# Patient Record
Sex: Female | Born: 1935 | Race: White | Hispanic: No | Smoking: Never smoker
Health system: Southern US, Community
[De-identification: ages and names within clinical notes are randomized; demographics above are authoritative.]

## PROBLEM LIST (undated history)

## (undated) DIAGNOSIS — M25569 Pain in unspecified knee: Secondary | ICD-10-CM

## (undated) DIAGNOSIS — M899 Disorder of bone, unspecified: Secondary | ICD-10-CM

## (undated) DIAGNOSIS — M1990 Unspecified osteoarthritis, unspecified site: Secondary | ICD-10-CM

## (undated) DIAGNOSIS — J45909 Unspecified asthma, uncomplicated: Secondary | ICD-10-CM

## (undated) DIAGNOSIS — Z860100 Personal history of colon polyps, unspecified: Secondary | ICD-10-CM

## (undated) DIAGNOSIS — J189 Pneumonia, unspecified organism: Secondary | ICD-10-CM

## (undated) DIAGNOSIS — Z87898 Personal history of other specified conditions: Secondary | ICD-10-CM

## (undated) DIAGNOSIS — G629 Polyneuropathy, unspecified: Secondary | ICD-10-CM

## (undated) DIAGNOSIS — R609 Edema, unspecified: Secondary | ICD-10-CM

## (undated) DIAGNOSIS — G8929 Other chronic pain: Secondary | ICD-10-CM

## (undated) DIAGNOSIS — S5420XA Injury of radial nerve at forearm level, unspecified arm, initial encounter: Secondary | ICD-10-CM

## (undated) DIAGNOSIS — I25810 Atherosclerosis of coronary artery bypass graft(s) without angina pectoris: Secondary | ICD-10-CM

## (undated) DIAGNOSIS — R32 Unspecified urinary incontinence: Secondary | ICD-10-CM

## (undated) DIAGNOSIS — R600 Localized edema: Secondary | ICD-10-CM

## (undated) DIAGNOSIS — M5430 Sciatica, unspecified side: Secondary | ICD-10-CM

## (undated) DIAGNOSIS — K5790 Diverticulosis of intestine, part unspecified, without perforation or abscess without bleeding: Secondary | ICD-10-CM

## (undated) DIAGNOSIS — M949 Disorder of cartilage, unspecified: Secondary | ICD-10-CM

## (undated) DIAGNOSIS — K589 Irritable bowel syndrome without diarrhea: Secondary | ICD-10-CM

## (undated) DIAGNOSIS — R159 Full incontinence of feces: Secondary | ICD-10-CM

## (undated) DIAGNOSIS — M549 Dorsalgia, unspecified: Secondary | ICD-10-CM

## (undated) DIAGNOSIS — R531 Weakness: Secondary | ICD-10-CM

## (undated) DIAGNOSIS — F329 Major depressive disorder, single episode, unspecified: Secondary | ICD-10-CM

## (undated) DIAGNOSIS — L508 Other urticaria: Secondary | ICD-10-CM

## (undated) DIAGNOSIS — R351 Nocturia: Secondary | ICD-10-CM

## (undated) DIAGNOSIS — F3289 Other specified depressive episodes: Secondary | ICD-10-CM

## (undated) DIAGNOSIS — R350 Frequency of micturition: Secondary | ICD-10-CM

## (undated) DIAGNOSIS — G609 Hereditary and idiopathic neuropathy, unspecified: Secondary | ICD-10-CM

## (undated) DIAGNOSIS — I10 Essential (primary) hypertension: Secondary | ICD-10-CM

## (undated) DIAGNOSIS — R238 Other skin changes: Secondary | ICD-10-CM

## (undated) DIAGNOSIS — E785 Hyperlipidemia, unspecified: Secondary | ICD-10-CM

## (undated) DIAGNOSIS — B3731 Acute candidiasis of vulva and vagina: Secondary | ICD-10-CM

## (undated) DIAGNOSIS — R233 Spontaneous ecchymoses: Secondary | ICD-10-CM

## (undated) DIAGNOSIS — R3915 Urgency of urination: Secondary | ICD-10-CM

## (undated) DIAGNOSIS — Z8679 Personal history of other diseases of the circulatory system: Secondary | ICD-10-CM

## (undated) DIAGNOSIS — M069 Rheumatoid arthritis, unspecified: Secondary | ICD-10-CM

## (undated) DIAGNOSIS — J302 Other seasonal allergic rhinitis: Secondary | ICD-10-CM

## (undated) DIAGNOSIS — D649 Anemia, unspecified: Secondary | ICD-10-CM

## (undated) DIAGNOSIS — B373 Candidiasis of vulva and vagina: Secondary | ICD-10-CM

## (undated) DIAGNOSIS — K219 Gastro-esophageal reflux disease without esophagitis: Secondary | ICD-10-CM

## (undated) DIAGNOSIS — I2510 Atherosclerotic heart disease of native coronary artery without angina pectoris: Secondary | ICD-10-CM

## (undated) HISTORY — DX: Hyperlipidemia, unspecified: E78.5

## (undated) HISTORY — PX: BREAST SURGERY: SHX581

## (undated) HISTORY — DX: Unspecified osteoarthritis, unspecified site: M19.90

## (undated) HISTORY — DX: Other urticaria: L50.8

## (undated) HISTORY — DX: Disorder of cartilage, unspecified: M94.9

## (undated) HISTORY — DX: Essential (primary) hypertension: I10

## (undated) HISTORY — PX: BACK SURGERY: SHX140

## (undated) HISTORY — DX: Irritable bowel syndrome, unspecified: K58.9

## (undated) HISTORY — DX: Hereditary and idiopathic neuropathy, unspecified: G60.9

## (undated) HISTORY — DX: Gastro-esophageal reflux disease without esophagitis: K21.9

## (undated) HISTORY — DX: Personal history of other diseases of the circulatory system: Z86.79

## (undated) HISTORY — DX: Atherosclerotic heart disease of native coronary artery without angina pectoris: I25.10

## (undated) HISTORY — DX: Major depressive disorder, single episode, unspecified: F32.9

## (undated) HISTORY — DX: Edema, unspecified: R60.9

## (undated) HISTORY — DX: Polyneuropathy, unspecified: G62.9

## (undated) HISTORY — DX: Diverticulosis of intestine, part unspecified, without perforation or abscess without bleeding: K57.90

## (undated) HISTORY — DX: Personal history of other specified conditions: Z87.898

## (undated) HISTORY — PX: COLONOSCOPY: SHX174

## (undated) HISTORY — DX: Atherosclerosis of coronary artery bypass graft(s) without angina pectoris: I25.810

## (undated) HISTORY — PX: TONSILLECTOMY: SUR1361

## (undated) HISTORY — PX: HERNIA REPAIR: SHX51

## (undated) HISTORY — DX: Rheumatoid arthritis, unspecified: M06.9

## (undated) HISTORY — PX: JOINT REPLACEMENT: SHX530

## (undated) HISTORY — DX: Pain in unspecified knee: M25.569

## (undated) HISTORY — DX: Disorder of bone, unspecified: M89.9

## (undated) HISTORY — PX: APPENDECTOMY: SHX54

## (undated) HISTORY — DX: Other specified depressive episodes: F32.89

---

## 1998-08-09 ENCOUNTER — Other Ambulatory Visit: Admission: RE | Admit: 1998-08-09 | Discharge: 1998-08-09 | Payer: Self-pay | Admitting: Obstetrics and Gynecology

## 2000-03-25 ENCOUNTER — Other Ambulatory Visit: Admission: RE | Admit: 2000-03-25 | Discharge: 2000-03-25 | Payer: Self-pay | Admitting: Obstetrics and Gynecology

## 2000-04-14 ENCOUNTER — Ambulatory Visit (HOSPITAL_COMMUNITY): Admission: RE | Admit: 2000-04-14 | Discharge: 2000-04-14 | Payer: Self-pay | Admitting: Obstetrics and Gynecology

## 2000-04-14 ENCOUNTER — Encounter (INDEPENDENT_AMBULATORY_CARE_PROVIDER_SITE_OTHER): Payer: Self-pay

## 2001-05-06 ENCOUNTER — Other Ambulatory Visit: Admission: RE | Admit: 2001-05-06 | Discharge: 2001-05-06 | Payer: Self-pay | Admitting: Obstetrics and Gynecology

## 2002-06-23 ENCOUNTER — Other Ambulatory Visit: Admission: RE | Admit: 2002-06-23 | Discharge: 2002-06-23 | Payer: Self-pay | Admitting: Obstetrics and Gynecology

## 2003-09-21 ENCOUNTER — Other Ambulatory Visit: Admission: RE | Admit: 2003-09-21 | Discharge: 2003-09-21 | Payer: Self-pay | Admitting: Obstetrics and Gynecology

## 2004-10-23 ENCOUNTER — Ambulatory Visit: Payer: Self-pay | Admitting: Internal Medicine

## 2004-10-31 ENCOUNTER — Ambulatory Visit: Payer: Self-pay | Admitting: Internal Medicine

## 2005-01-15 ENCOUNTER — Other Ambulatory Visit: Admission: RE | Admit: 2005-01-15 | Discharge: 2005-01-15 | Payer: Self-pay | Admitting: Obstetrics and Gynecology

## 2005-10-14 ENCOUNTER — Ambulatory Visit: Payer: Self-pay | Admitting: Internal Medicine

## 2005-12-12 ENCOUNTER — Ambulatory Visit: Payer: Self-pay | Admitting: Internal Medicine

## 2005-12-25 ENCOUNTER — Ambulatory Visit: Payer: Self-pay | Admitting: Internal Medicine

## 2006-02-16 ENCOUNTER — Ambulatory Visit: Payer: Self-pay | Admitting: Internal Medicine

## 2006-02-18 ENCOUNTER — Ambulatory Visit: Payer: Self-pay | Admitting: Internal Medicine

## 2006-03-03 ENCOUNTER — Ambulatory Visit: Payer: Self-pay | Admitting: Internal Medicine

## 2006-03-10 ENCOUNTER — Ambulatory Visit: Payer: Self-pay

## 2006-03-17 ENCOUNTER — Ambulatory Visit: Payer: Self-pay

## 2006-03-17 ENCOUNTER — Encounter: Payer: Self-pay | Admitting: Internal Medicine

## 2006-05-28 ENCOUNTER — Ambulatory Visit: Payer: Self-pay | Admitting: Internal Medicine

## 2006-07-23 ENCOUNTER — Other Ambulatory Visit: Admission: RE | Admit: 2006-07-23 | Discharge: 2006-07-23 | Payer: Self-pay | Admitting: Obstetrics and Gynecology

## 2007-01-15 ENCOUNTER — Ambulatory Visit: Payer: Self-pay | Admitting: Internal Medicine

## 2007-05-18 ENCOUNTER — Ambulatory Visit: Payer: Self-pay | Admitting: Internal Medicine

## 2007-05-18 LAB — CONVERTED CEMR LAB
ALT: 24 U/L
AST: 23 U/L
Albumin: 3.7 g/dL
Alkaline Phosphatase: 72 U/L
BUN: 14 mg/dL
Basophils Absolute: 0 10*3/uL
Basophils Relative: 0.8 %
Bilirubin, Direct: 0.1 mg/dL
CO2: 33 meq/L — ABNORMAL HIGH
Calcium: 9.6 mg/dL
Chloride: 100 meq/L
Cholesterol: 241 mg/dL
Creatinine, Ser: 0.7 mg/dL
Direct LDL: 127.4 mg/dL
Eosinophils Absolute: 0.2 10*3/uL
Eosinophils Relative: 2.6 %
GFR calc Af Amer: 105 mL/min
GFR calc non Af Amer: 87 mL/min
Glucose, Bld: 88 mg/dL
HCT: 39.6 %
HDL: 86.2 mg/dL
Hemoglobin: 13.8 g/dL
Lymphocytes Relative: 22 %
MCHC: 34.7 g/dL
MCV: 86.5 fL
Monocytes Absolute: 0.6 10*3/uL
Monocytes Relative: 10.3 %
Neutro Abs: 4 10*3/uL
Neutrophils Relative %: 64.3 %
Platelets: 236 10*3/uL
Potassium: 3.5 meq/L
RBC: 4.58 M/uL
RDW: 12.8 %
Sodium: 142 meq/L
TSH: 3.1 u[IU]/mL
Total Bilirubin: 0.8 mg/dL
Total CHOL/HDL Ratio: 2.8
Total Protein: 6.9 g/dL
Triglycerides: 53 mg/dL
VLDL: 11 mg/dL
WBC: 6.2 10*3/uL

## 2007-05-22 ENCOUNTER — Ambulatory Visit: Payer: Self-pay | Admitting: Internal Medicine

## 2007-05-25 ENCOUNTER — Telehealth: Payer: Self-pay | Admitting: Internal Medicine

## 2007-07-07 ENCOUNTER — Telehealth: Payer: Self-pay | Admitting: Internal Medicine

## 2007-08-11 ENCOUNTER — Encounter: Payer: Self-pay | Admitting: Internal Medicine

## 2007-09-16 ENCOUNTER — Ambulatory Visit: Payer: Self-pay | Admitting: Internal Medicine

## 2007-11-25 HISTORY — PX: CARDIAC CATHETERIZATION: SHX172

## 2008-01-20 ENCOUNTER — Ambulatory Visit: Payer: Self-pay | Admitting: Internal Medicine

## 2008-03-27 ENCOUNTER — Encounter: Payer: Self-pay | Admitting: Internal Medicine

## 2008-05-11 ENCOUNTER — Ambulatory Visit: Payer: Self-pay | Admitting: Internal Medicine

## 2008-05-11 DIAGNOSIS — I10 Essential (primary) hypertension: Secondary | ICD-10-CM

## 2008-06-15 ENCOUNTER — Telehealth: Payer: Self-pay | Admitting: Internal Medicine

## 2008-06-15 ENCOUNTER — Ambulatory Visit: Admission: RE | Admit: 2008-06-15 | Discharge: 2008-06-15 | Payer: Self-pay | Admitting: Orthopedic Surgery

## 2008-06-16 ENCOUNTER — Encounter: Admission: RE | Admit: 2008-06-16 | Discharge: 2008-06-16 | Payer: Self-pay | Admitting: Orthopedic Surgery

## 2008-06-16 ENCOUNTER — Telehealth: Payer: Self-pay | Admitting: Internal Medicine

## 2008-06-24 HISTORY — PX: ASCENDING AORTIC ANEURYSM REPAIR: SHX1191

## 2008-06-24 HISTORY — PX: CORONARY ARTERY BYPASS GRAFT: SHX141

## 2008-06-28 ENCOUNTER — Ambulatory Visit: Payer: Self-pay | Admitting: Vascular Surgery

## 2008-06-30 ENCOUNTER — Ambulatory Visit: Payer: Self-pay | Admitting: Cardiothoracic Surgery

## 2008-06-30 ENCOUNTER — Encounter: Payer: Self-pay | Admitting: Internal Medicine

## 2008-06-30 ENCOUNTER — Ambulatory Visit: Payer: Self-pay | Admitting: Internal Medicine

## 2008-07-04 ENCOUNTER — Inpatient Hospital Stay (HOSPITAL_BASED_OUTPATIENT_CLINIC_OR_DEPARTMENT_OTHER): Admission: RE | Admit: 2008-07-04 | Discharge: 2008-07-04 | Payer: Self-pay | Admitting: Cardiology

## 2008-07-04 ENCOUNTER — Ambulatory Visit: Payer: Self-pay | Admitting: Cardiology

## 2008-07-07 ENCOUNTER — Ambulatory Visit: Payer: Self-pay

## 2008-07-07 ENCOUNTER — Encounter: Payer: Self-pay | Admitting: Internal Medicine

## 2008-07-13 ENCOUNTER — Ambulatory Visit: Payer: Self-pay | Admitting: Internal Medicine

## 2008-07-14 ENCOUNTER — Ambulatory Visit: Payer: Self-pay | Admitting: Cardiothoracic Surgery

## 2008-07-14 ENCOUNTER — Encounter: Payer: Self-pay | Admitting: Internal Medicine

## 2008-07-18 ENCOUNTER — Encounter: Payer: Self-pay | Admitting: Cardiothoracic Surgery

## 2008-07-18 ENCOUNTER — Ambulatory Visit (HOSPITAL_COMMUNITY): Admission: RE | Admit: 2008-07-18 | Discharge: 2008-07-18 | Payer: Self-pay | Admitting: Cardiothoracic Surgery

## 2008-07-20 ENCOUNTER — Ambulatory Visit: Payer: Self-pay | Admitting: Cardiothoracic Surgery

## 2008-07-20 ENCOUNTER — Inpatient Hospital Stay (HOSPITAL_COMMUNITY): Admission: RE | Admit: 2008-07-20 | Discharge: 2008-07-27 | Payer: Self-pay | Admitting: Cardiothoracic Surgery

## 2008-07-20 ENCOUNTER — Encounter: Payer: Self-pay | Admitting: Cardiothoracic Surgery

## 2008-07-26 ENCOUNTER — Encounter: Payer: Self-pay | Admitting: Cardiothoracic Surgery

## 2008-08-18 ENCOUNTER — Ambulatory Visit: Payer: Self-pay | Admitting: Cardiothoracic Surgery

## 2008-08-18 ENCOUNTER — Encounter: Admission: RE | Admit: 2008-08-18 | Discharge: 2008-08-18 | Payer: Self-pay | Admitting: Cardiothoracic Surgery

## 2008-08-23 ENCOUNTER — Ambulatory Visit: Payer: Self-pay | Admitting: Internal Medicine

## 2008-08-23 DIAGNOSIS — I25810 Atherosclerosis of coronary artery bypass graft(s) without angina pectoris: Secondary | ICD-10-CM | POA: Insufficient documentation

## 2008-08-24 ENCOUNTER — Ambulatory Visit: Payer: Self-pay | Admitting: Internal Medicine

## 2008-08-24 LAB — CONVERTED CEMR LAB
BUN: 10 mg/dL (ref 6–23)
Chloride: 99 meq/L (ref 96–112)
GFR calc non Af Amer: 104 mL/min
Potassium: 4.4 meq/L (ref 3.5–5.1)
Sodium: 139 meq/L (ref 135–145)

## 2008-09-08 ENCOUNTER — Ambulatory Visit: Payer: Self-pay | Admitting: Cardiothoracic Surgery

## 2008-09-14 ENCOUNTER — Encounter (HOSPITAL_COMMUNITY): Admission: RE | Admit: 2008-09-14 | Discharge: 2008-11-22 | Payer: Self-pay | Admitting: Internal Medicine

## 2008-09-22 ENCOUNTER — Telehealth: Payer: Self-pay | Admitting: Internal Medicine

## 2008-09-25 ENCOUNTER — Emergency Department (HOSPITAL_COMMUNITY): Admission: EM | Admit: 2008-09-25 | Discharge: 2008-09-25 | Payer: Self-pay | Admitting: Emergency Medicine

## 2008-09-29 ENCOUNTER — Encounter: Admission: RE | Admit: 2008-09-29 | Discharge: 2008-09-29 | Payer: Self-pay | Admitting: Neurosurgery

## 2008-10-03 ENCOUNTER — Ambulatory Visit: Payer: Self-pay | Admitting: Internal Medicine

## 2008-10-09 ENCOUNTER — Ambulatory Visit: Payer: Self-pay | Admitting: Internal Medicine

## 2008-10-09 LAB — CONVERTED CEMR LAB
ALT: 20 units/L (ref 0–35)
AST: 23 units/L (ref 0–37)

## 2008-11-23 ENCOUNTER — Ambulatory Visit: Payer: Self-pay | Admitting: Internal Medicine

## 2008-11-24 ENCOUNTER — Encounter (HOSPITAL_COMMUNITY): Admission: RE | Admit: 2008-11-24 | Discharge: 2008-12-01 | Payer: Self-pay | Admitting: Internal Medicine

## 2008-11-29 ENCOUNTER — Telehealth (INDEPENDENT_AMBULATORY_CARE_PROVIDER_SITE_OTHER): Payer: Self-pay | Admitting: *Deleted

## 2008-12-12 ENCOUNTER — Encounter: Admission: RE | Admit: 2008-12-12 | Discharge: 2009-02-08 | Payer: Self-pay | Admitting: Internal Medicine

## 2008-12-21 ENCOUNTER — Ambulatory Visit: Payer: Self-pay | Admitting: Internal Medicine

## 2009-01-02 ENCOUNTER — Telehealth: Payer: Self-pay | Admitting: Internal Medicine

## 2009-01-05 ENCOUNTER — Telehealth: Payer: Self-pay | Admitting: Internal Medicine

## 2009-01-12 ENCOUNTER — Ambulatory Visit: Payer: Self-pay | Admitting: Internal Medicine

## 2009-01-12 LAB — CONVERTED CEMR LAB
CRP: 1.8 mg/dL — ABNORMAL HIGH (ref <0.6)
Sed Rate: 15 mm/hr (ref 0–22)

## 2009-01-16 ENCOUNTER — Telehealth: Payer: Self-pay | Admitting: Internal Medicine

## 2009-01-17 ENCOUNTER — Telehealth (INDEPENDENT_AMBULATORY_CARE_PROVIDER_SITE_OTHER): Payer: Self-pay | Admitting: *Deleted

## 2009-01-24 ENCOUNTER — Telehealth (INDEPENDENT_AMBULATORY_CARE_PROVIDER_SITE_OTHER): Payer: Self-pay | Admitting: *Deleted

## 2009-02-02 ENCOUNTER — Telehealth: Payer: Self-pay | Admitting: Internal Medicine

## 2009-02-02 ENCOUNTER — Ambulatory Visit: Payer: Self-pay | Admitting: Internal Medicine

## 2009-02-09 ENCOUNTER — Encounter: Payer: Self-pay | Admitting: Internal Medicine

## 2009-02-20 ENCOUNTER — Ambulatory Visit: Payer: Self-pay | Admitting: Internal Medicine

## 2009-02-21 ENCOUNTER — Encounter: Payer: Self-pay | Admitting: Internal Medicine

## 2009-03-16 ENCOUNTER — Encounter: Admission: RE | Admit: 2009-03-16 | Discharge: 2009-03-16 | Payer: Self-pay | Admitting: Cardiothoracic Surgery

## 2009-03-16 ENCOUNTER — Ambulatory Visit: Payer: Self-pay | Admitting: Cardiothoracic Surgery

## 2009-03-19 ENCOUNTER — Inpatient Hospital Stay (HOSPITAL_COMMUNITY): Admission: RE | Admit: 2009-03-19 | Discharge: 2009-03-22 | Payer: Self-pay | Admitting: Orthopedic Surgery

## 2009-03-20 ENCOUNTER — Encounter: Payer: Self-pay | Admitting: Internal Medicine

## 2009-04-19 ENCOUNTER — Ambulatory Visit: Payer: Self-pay | Admitting: Internal Medicine

## 2009-04-24 ENCOUNTER — Encounter: Payer: Self-pay | Admitting: Internal Medicine

## 2009-05-21 ENCOUNTER — Ambulatory Visit: Payer: Self-pay | Admitting: Internal Medicine

## 2009-05-22 ENCOUNTER — Ambulatory Visit: Payer: Self-pay | Admitting: Vascular Surgery

## 2009-05-22 ENCOUNTER — Ambulatory Visit (HOSPITAL_COMMUNITY): Admission: RE | Admit: 2009-05-22 | Discharge: 2009-05-22 | Payer: Self-pay | Admitting: Internal Medicine

## 2009-05-22 ENCOUNTER — Encounter: Payer: Self-pay | Admitting: Internal Medicine

## 2009-05-25 ENCOUNTER — Ambulatory Visit: Payer: Self-pay | Admitting: Internal Medicine

## 2009-05-31 LAB — CONVERTED CEMR LAB
Calcium: 9.3 mg/dL (ref 8.4–10.5)
Eosinophils Absolute: 0.2 10*3/uL (ref 0.0–0.7)
GFR calc non Af Amer: 86.69 mL/min (ref >60)
HCT: 32.8 % — ABNORMAL LOW (ref 36.0–46.0)
Lymphs Abs: 1.1 10*3/uL (ref 0.7–4.0)
MCHC: 34 g/dL (ref 30.0–36.0)
MCV: 86 fL (ref 78.0–100.0)
Monocytes Absolute: 0.9 10*3/uL (ref 0.1–1.0)
Neutrophils Relative %: 75.7 % (ref 43.0–77.0)
Platelets: 268 10*3/uL (ref 150.0–400.0)
Potassium: 4.1 meq/L (ref 3.5–5.1)
Pro B Natriuretic peptide (BNP): 294 pg/mL — ABNORMAL HIGH (ref 0.0–100.0)
RDW: 14.4 % (ref 11.5–14.6)
Sodium: 140 meq/L (ref 135–145)

## 2009-06-01 ENCOUNTER — Telehealth: Payer: Self-pay | Admitting: Internal Medicine

## 2009-06-08 ENCOUNTER — Ambulatory Visit: Payer: Self-pay | Admitting: Internal Medicine

## 2009-06-12 ENCOUNTER — Telehealth: Payer: Self-pay | Admitting: Internal Medicine

## 2009-06-12 LAB — CONVERTED CEMR LAB
BUN: 15 mg/dL (ref 6–23)
Chloride: 101 meq/L (ref 96–112)
Creatinine, Ser: 0.7 mg/dL (ref 0.4–1.2)
GFR calc non Af Amer: 86.68 mL/min (ref >60)
Potassium: 3.4 meq/L — ABNORMAL LOW (ref 3.5–5.1)
Pro B Natriuretic peptide (BNP): 252 pg/mL — ABNORMAL HIGH (ref 0.0–100.0)

## 2009-06-20 ENCOUNTER — Ambulatory Visit: Payer: Self-pay | Admitting: Internal Medicine

## 2009-06-26 ENCOUNTER — Ambulatory Visit: Payer: Self-pay | Admitting: Internal Medicine

## 2009-06-26 ENCOUNTER — Telehealth: Payer: Self-pay | Admitting: Internal Medicine

## 2009-06-26 LAB — CONVERTED CEMR LAB
CO2: 31 meq/L (ref 19–32)
Calcium: 8.9 mg/dL (ref 8.4–10.5)
Chloride: 103 meq/L (ref 96–112)
Creatinine, Ser: 0.7 mg/dL (ref 0.4–1.2)
Pro B Natriuretic peptide (BNP): 324 pg/mL — ABNORMAL HIGH (ref 0.0–100.0)
Sodium: 140 meq/L (ref 135–145)

## 2009-07-03 ENCOUNTER — Encounter: Admission: RE | Admit: 2009-07-03 | Discharge: 2009-07-31 | Payer: Self-pay | Admitting: Internal Medicine

## 2009-07-04 ENCOUNTER — Encounter: Payer: Self-pay | Admitting: Internal Medicine

## 2009-07-11 ENCOUNTER — Telehealth: Payer: Self-pay | Admitting: Internal Medicine

## 2009-07-19 ENCOUNTER — Encounter: Payer: Self-pay | Admitting: Cardiovascular Disease

## 2009-07-19 ENCOUNTER — Ambulatory Visit: Payer: Self-pay | Admitting: Internal Medicine

## 2009-07-20 ENCOUNTER — Encounter: Payer: Self-pay | Admitting: Internal Medicine

## 2009-07-26 ENCOUNTER — Encounter: Payer: Self-pay | Admitting: Internal Medicine

## 2009-07-27 ENCOUNTER — Encounter: Payer: Self-pay | Admitting: Internal Medicine

## 2009-07-31 ENCOUNTER — Encounter: Payer: Self-pay | Admitting: Internal Medicine

## 2009-08-20 ENCOUNTER — Encounter: Payer: Self-pay | Admitting: Internal Medicine

## 2009-09-14 ENCOUNTER — Ambulatory Visit: Payer: Self-pay | Admitting: Cardiothoracic Surgery

## 2009-09-14 ENCOUNTER — Encounter: Payer: Self-pay | Admitting: Internal Medicine

## 2009-09-14 ENCOUNTER — Encounter: Admission: RE | Admit: 2009-09-14 | Discharge: 2009-09-14 | Payer: Self-pay | Admitting: Cardiothoracic Surgery

## 2009-09-27 ENCOUNTER — Ambulatory Visit: Payer: Self-pay | Admitting: Internal Medicine

## 2009-11-26 ENCOUNTER — Encounter (INDEPENDENT_AMBULATORY_CARE_PROVIDER_SITE_OTHER): Payer: Self-pay | Admitting: *Deleted

## 2009-12-17 ENCOUNTER — Encounter: Payer: Self-pay | Admitting: Internal Medicine

## 2009-12-20 ENCOUNTER — Encounter: Payer: Self-pay | Admitting: Internal Medicine

## 2009-12-25 ENCOUNTER — Encounter: Payer: Self-pay | Admitting: Internal Medicine

## 2009-12-27 ENCOUNTER — Encounter: Payer: Self-pay | Admitting: Internal Medicine

## 2009-12-28 ENCOUNTER — Ambulatory Visit: Payer: Self-pay | Admitting: Internal Medicine

## 2010-03-27 ENCOUNTER — Ambulatory Visit: Payer: Self-pay | Admitting: Internal Medicine

## 2010-04-09 ENCOUNTER — Encounter: Payer: Self-pay | Admitting: Internal Medicine

## 2010-04-09 ENCOUNTER — Encounter: Admission: RE | Admit: 2010-04-09 | Discharge: 2010-04-09 | Payer: Self-pay | Admitting: Neurosurgery

## 2010-04-25 ENCOUNTER — Encounter: Payer: Self-pay | Admitting: Internal Medicine

## 2010-05-09 ENCOUNTER — Telehealth: Payer: Self-pay

## 2010-05-10 ENCOUNTER — Encounter: Payer: Self-pay | Admitting: Internal Medicine

## 2010-05-20 ENCOUNTER — Encounter (INDEPENDENT_AMBULATORY_CARE_PROVIDER_SITE_OTHER): Payer: Self-pay | Admitting: Neurosurgery

## 2010-05-20 ENCOUNTER — Ambulatory Visit: Payer: Self-pay | Admitting: Vascular Surgery

## 2010-05-20 ENCOUNTER — Inpatient Hospital Stay (HOSPITAL_COMMUNITY): Admission: RE | Admit: 2010-05-20 | Discharge: 2010-05-22 | Payer: Self-pay | Admitting: Neurosurgery

## 2010-05-24 ENCOUNTER — Telehealth: Payer: Self-pay | Admitting: Internal Medicine

## 2010-06-05 ENCOUNTER — Encounter: Payer: Self-pay | Admitting: Internal Medicine

## 2010-06-06 ENCOUNTER — Telehealth: Payer: Self-pay | Admitting: Internal Medicine

## 2010-06-07 ENCOUNTER — Telehealth: Payer: Self-pay | Admitting: Internal Medicine

## 2010-06-10 ENCOUNTER — Encounter: Payer: Self-pay | Admitting: Internal Medicine

## 2010-06-20 ENCOUNTER — Encounter: Admission: RE | Admit: 2010-06-20 | Discharge: 2010-06-20 | Payer: Self-pay | Admitting: Neurosurgery

## 2010-07-24 ENCOUNTER — Encounter: Payer: Self-pay | Admitting: Internal Medicine

## 2010-08-01 ENCOUNTER — Encounter: Admission: RE | Admit: 2010-08-01 | Discharge: 2010-08-01 | Payer: Self-pay | Admitting: Neurosurgery

## 2010-08-19 ENCOUNTER — Encounter: Payer: Self-pay | Admitting: Internal Medicine

## 2010-09-02 ENCOUNTER — Ambulatory Visit: Payer: Self-pay | Admitting: Internal Medicine

## 2010-09-05 ENCOUNTER — Encounter: Payer: Self-pay | Admitting: Internal Medicine

## 2010-09-13 ENCOUNTER — Ambulatory Visit: Payer: Self-pay | Admitting: Internal Medicine

## 2010-09-13 ENCOUNTER — Encounter: Payer: Self-pay | Admitting: Internal Medicine

## 2010-09-25 ENCOUNTER — Telehealth: Payer: Self-pay | Admitting: Internal Medicine

## 2010-09-30 ENCOUNTER — Ambulatory Visit (HOSPITAL_COMMUNITY): Admission: RE | Admit: 2010-09-30 | Discharge: 2010-09-30 | Payer: Self-pay | Admitting: Orthopedic Surgery

## 2010-10-02 ENCOUNTER — Ambulatory Visit: Payer: Self-pay | Admitting: Family Medicine

## 2010-10-02 ENCOUNTER — Encounter: Payer: Self-pay | Admitting: Family Medicine

## 2010-10-07 ENCOUNTER — Ambulatory Visit: Payer: Self-pay | Admitting: Internal Medicine

## 2010-12-12 ENCOUNTER — Telehealth: Payer: Self-pay | Admitting: Internal Medicine

## 2010-12-13 ENCOUNTER — Telehealth: Payer: Self-pay | Admitting: Internal Medicine

## 2010-12-16 ENCOUNTER — Telehealth: Payer: Self-pay | Admitting: Internal Medicine

## 2010-12-18 LAB — CBC
MCH: 30.8 pg (ref 26.0–34.0)
RBC: 3.96 MIL/uL (ref 3.87–5.11)
WBC: 6 10*3/uL (ref 4.0–10.5)

## 2010-12-18 LAB — SURGICAL PCR SCREEN
MRSA, PCR: NEGATIVE
Staphylococcus aureus: POSITIVE — AB

## 2010-12-18 LAB — URINALYSIS, ROUTINE W REFLEX MICROSCOPIC
Bilirubin Urine: NEGATIVE
Hgb urine dipstick: NEGATIVE
Protein, ur: NEGATIVE mg/dL
Urobilinogen, UA: 0.2 mg/dL (ref 0.0–1.0)

## 2010-12-18 LAB — COMPREHENSIVE METABOLIC PANEL
AST: 26 U/L (ref 0–37)
Albumin: 3.6 g/dL (ref 3.5–5.2)
Alkaline Phosphatase: 91 U/L (ref 39–117)
BUN: 16 mg/dL (ref 6–23)
Chloride: 100 mEq/L (ref 96–112)
GFR calc Af Amer: >60 mL/min (ref >60)
Potassium: 3.5 mEq/L (ref 3.5–5.1)
Total Bilirubin: 0.6 mg/dL (ref 0.3–1.2)

## 2010-12-18 LAB — APTT: aPTT: 32 seconds (ref 24–37)

## 2010-12-19 ENCOUNTER — Ambulatory Visit
Admission: RE | Admit: 2010-12-19 | Discharge: 2010-12-19 | Payer: Self-pay | Source: Home / Self Care | Attending: Internal Medicine | Admitting: Internal Medicine

## 2010-12-24 ENCOUNTER — Telehealth: Payer: Self-pay | Admitting: Internal Medicine

## 2010-12-24 NOTE — Progress Notes (Signed)
Summary: PT REQUESTING RETURN CALL  Phone Note Call from Patient   Caller: Patient  [phone] Reason for Call: Talk to Nurse, Talk to Doctor Summary of Call: Pt called to speak with Dr Kirtland Bouchard or Selena Batten, RN.... Pt adv that she is exp some swelling in her L leg and has a rash that has started coming up on both legs, pt adv that the edema was present prior to her recent surgery (C4 through 6 anterior cervical fusion) .... Pt has also been exp bilateral leg discomfort that she says it due to itching,  (pt denies any other sxs, denies pain)...Marland KitchenMarland KitchenPost surgery (on Monday, May 20, 2010) by Dr Wynetta Emery pt was prescribed med: Flexeril / Cyclobenzaprine,  Pt wonders if rash / itching could be from this new med...d/c med?Marland Kitchen.... Should she be wearing her support hose?  Pt adv that she also called Cardiology Division (notation in EMR) but was told Dr Tenny Craw had already left for the day?Marland Kitchen... Pt is concerned she will not get to speak with anyone before the holiday weekend.... Offered to switch pt to Triage for further (possible OV if needed) and the pt declined stating that she doesn't feel that she needs to come into office.  Pt also would like know what OTC medication (stool-softener) she can take in combination with other meds she is taking?  Pt req a return call at [phone].  Initial call taken by: Debbra Riding,  May 24, 2010 3:12 PM  Follow-up for Phone Call        called Follow-up by: Gordy Savers  MD,  May 24, 2010 5:14 PM

## 2010-12-24 NOTE — Letter (Signed)
Summary: Request for Surgical Clearance/[facility]  Request for Surgical Clearance/[facility]   Imported By: Maryln Gottron 08/22/2010 09:10:26  _____________________________________________________________________  External Attachment:    Type:   Image     Comment:   External Document

## 2010-12-24 NOTE — Assessment & Plan Note (Signed)
Summary: discuss fractured ribs/dm   Vital Signs:  Patient profile:   77 year old female Weight:      166 pounds Temp:     98.1 degrees F oral BP sitting:   138 / 70  (right arm) Cuff size:   regular  Vitals Entered By: Duard Brady LPN (October 07, 2010 2:38 PM) CC: f/u on rib fx from last wk - having problems sleeping Is Patient Diabetic? No   Primary Care Provider:  Gordy Savers  MD  CC:  f/u on rib fx from last wk - having problems sleeping.  History of Present Illness: 77 year old patient the film 6 days ago, and traumatized in her right lateral chest wall area.  X-rays confirmed the least 3 right-sided rib fractures.  She is still quite uncomfortable, but improving.  She is quite distressed and frustrated that this will postpone knee replacement surgery.  She has treated hypertension.  Her cardiac status has been stable  Allergies: 1)  ! Pcn 2)  ! * Tetanus 3)  ! Flexeril 4)  Amoxicillin (Amoxicillin)  Past History:  Past Medical History: Reviewed history from 09/27/2009 and no changes required. Coronary artery disease s/p CABG Thoracic aortic aneurysm, s/p repair Dyslipidemia Depression Osteopenia Osteoarthritis urticaria Hypertension rheumatoid arthritis left radial  neuropathy with wrist drop vitamin D deficiency  Past Surgical History: Reviewed history from 04/19/2009 and no changes required. Appendectomy Caesarean section Hernia repair status post resection and grafting of a 5.2-cm ascending aortic arch aneurysm.  August 2009 status post CABG x 1 with LIMA to LAD August 2009  status post right totally replacement surgery April 2010 air contrast B. E. January 2005  Review of Systems       The patient complains of chest pain.    Physical Exam  General:  overweight-appearing.   Neck:  No deformities, masses, or tenderness noted. Chest Wall:  considerable ecchymoses involving her right posterior lateral chest wall region Lungs:   Normal respiratory effort, chest expands symmetrically. Lungs are clear to auscultation, no crackles or wheezes.   Impression & Recommendations:  Problem # 1:  RIB PAIN, RIGHT SIDED (ICD-786.50)  Problem # 2:  HYPERTENSION (ICD-401.9)  Her updated medication list for this problem includes:    Hydrochlorothiazide 25 Mg Tabs (Hydrochlorothiazide) .Marland Kitchen... Take 1 tablet by mouth once a day    Metoprolol Succinate 50 Mg Xr24h-tab (Metoprolol succinate) .Marland Kitchen... 1 by mouth once daily    Furosemide 40 Mg Tabs (Furosemide) .Marland Kitchen... 1 every other day  Complete Medication List: 1)  Hydrochlorothiazide 25 Mg Tabs (Hydrochlorothiazide) .... Take 1 tablet by mouth once a day 2)  Fosamax 70 Mg Tabs (Alendronate sodium) .... Every week 3)  Multivitamin  .... On hold 4)  Hydrocodone-acetaminophen 5-325 Mg Tabs (Hydrocodone-acetaminophen) .Marland Kitchen.. 1 by mouth q6hrs as needed pain 5)  Adult Aspirin Ec Low Strength 81 Mg Tbec (Aspirin) .Marland Kitchen.. 1 once daily 6)  Metoprolol Succinate 50 Mg Xr24h-tab (Metoprolol succinate) .Marland Kitchen.. 1 by mouth once daily 7)  Simvastatin 40 Mg Tabs (Simvastatin) .Marland Kitchen.. 1 once daily 8)  Furosemide 40 Mg Tabs (Furosemide) .Marland Kitchen.. 1 every other day 9)  Vitamin D [address] Unit Caps (Ergocalciferol) .Marland Kitchen.. 1 2x weekly 10)  Zyrtec Allergy 10 Mg Tabs (Cetirizine hcl) .Marland Kitchen.. 1 once daily as needed 11)  Hydroxyzine Hcl 25 Mg Tabs (Hydroxyzine hcl) .... As needed 12)  Hydroxychloroquine Sulfate 200 Mg Tabs (Hydroxychloroquine sulfate) .Marland Kitchen.. 1 two times a day 13)  Potassium Chloride Crys Cr 20 Meq Cr-tabs (  Potassium chloride crys cr) .... One every other day 14)  Folic Acid 1 Mg Tabs (Folic acid) .... Take 1 tablet by mouth two times a day 15)  Methotrexate 2.5 Mg Tabs (Methotrexate sodium) .... 6 tablets once a week  Patient Instructions: 1)  Please schedule a follow-up appointment in 3 months. 2)  Limit your Sodium (Salt). 3)  It is important that you exercise regularly at least 20 minutes 5 times a week. If you  develop chest pain, have severe difficulty breathing, or feel very tired , stop exercising immediately and seek medical attention. 4)  You need to lose weight. Consider a lower calorie diet and regular exercise.    Orders Added: 1)  Est. Patient Level III [[address]]

## 2010-12-24 NOTE — Letter (Signed)
Summary: Generic Letter  [facility]  [address] [place], [place] [address]   Phone: [phone]  Fax: [phone]    05/10/2010  [name] [address] [place], [place]  [address]  Dear [name]:   [name] is a patient followed in our internal medicine practice and he is scheduled for a C4 through 6 anterior cervical fusion on May 20, 2010. She was seen in the office on May 4 and a phone contact was made on June 16.  This individual has coronary artery disease and status post abdominal aortic aneurysm repair.  She has been clinically stable and is also followed by cardiology.  There are no contraindications to the anticipated surgery.          Sincerely,   Eleonore Chiquito  MD

## 2010-12-24 NOTE — Letter (Signed)
Summary: [facility]  [facility]   Imported By: Maryln Gottron 06/17/2010 14:18:56  _____________________________________________________________________  External Attachment:    Type:   Image     Comment:   External Document

## 2010-12-24 NOTE — Assessment & Plan Note (Signed)
Summary: medical clearance for knne surgery/cjr   Vital Signs:  Patient profile:   77 year old female Height:      62 inches Weight:      166 pounds BMI:     30.47 Temp:     97.8 degrees F oral BP sitting:   108 / 70  (right arm) Cuff size:   regular  Vitals Entered By: Duard Brady LPN (September 02, 2010 2:22 PM) CC: medical clearence for surgery  (L) knee Is Patient Diabetic? No   Primary Care Provider:  Gordy Savers  MD  CC:  medical clearence for surgery  (L) knee.  History of Present Illness:  77 year old patient who is seen today for follow-up.  She is now two years status post surgery for an aortic aneurysm and  CABG.  She is anticipating total knee replacement surgery next month.  Her arthritic condition.  Limits.  Her activity, but she denies any active cardiopulmonary complaints.  She is scheduled for cardiology follow up later this month.  She has treated hypertension and dyslipidemia.  Allergies: 1)  ! Pcn 2)  ! * Tetanus 3)  ! Flexeril 4)  Amoxicillin (Amoxicillin)  Past History:  Past Medical History: Reviewed history from 09/27/2009 and no changes required. Coronary artery disease s/p CABG Thoracic aortic aneurysm, s/p repair Dyslipidemia Depression Osteopenia Osteoarthritis urticaria Hypertension rheumatoid arthritis left radial  neuropathy with wrist drop vitamin D deficiency  Past Surgical History: Reviewed history from 04/19/2009 and no changes required. Appendectomy Caesarean section Hernia repair status post resection and grafting of a 5.2-cm ascending aortic arch aneurysm.  August 2009 status post CABG x 1 with LIMA to LAD August 2009  status post right totally replacement surgery April 2010 air contrast B. E. January 2005  Review of Systems       The patient complains of muscle weakness and difficulty walking.  The patient denies anorexia, fever, weight loss, weight gain, vision loss, decreased hearing, hoarseness, chest  pain, syncope, dyspnea on exertion, peripheral edema, prolonged cough, headaches, hemoptysis, abdominal pain, melena, hematochezia, severe indigestion/heartburn, hematuria, incontinence, suspicious skin lesions, transient blindness, depression, unusual weight change, abnormal bleeding, enlarged lymph nodes, angioedema, and breast masses.    Physical Exam  General:  overweight-appearing.  blood pressure 110/70overweight-appearing.   Eyes:  No corneal or conjunctival inflammation noted. EOMI. Perrla. Funduscopic exam benign, without hemorrhages, exudates or papilledema. Vision grossly normal. Ears:  External ear exam shows no significant lesions or deformities.  Otoscopic examination reveals clear canals, tympanic membranes are intact bilaterally without bulging, retraction, inflammation or discharge. Hearing is grossly normal bilaterally. Mouth:  Oral mucosa and oropharynx without lesions or exudates.  Teeth in good repair. Neck:  No deformities, masses, or tenderness noted. Lungs:  a few crackles at the right base O2 saturation 96% Heart:  Normal rate and regular rhythm. S1 and S2 normal without gallop, murmur, click, rub or other extra sounds. Abdomen:  Bowel sounds positive,abdomen soft and non-tender without masses, organomegaly or hernias noted. Msk:  No deformity or scoliosis noted of thoracic or lumbar spine.   Pulses:  R and L carotid,radial,femoral,dorsalis pedis and posterior tibial pulses are full and equal bilaterally Extremities:  trace left ankle edema Skin:  Intact without suspicious lesions or rashes Cervical Nodes:  No lymphadenopathy noted   Impression & Recommendations:  Problem # 1:  HYPERTENSION (ICD-401.9)  Her updated medication list for this problem includes:    Hydrochlorothiazide 25 Mg Tabs (Hydrochlorothiazide) .Marland Kitchen... Take 1 tablet by mouth  once a day    Metoprolol Succinate 25 Mg Xr24h-tab (Metoprolol succinate) .Marland Kitchen... 1 two times a day    Furosemide 40 Mg Tabs  (Furosemide) .Marland Kitchen... 1 every other day  Her updated medication list for this problem includes:    Hydrochlorothiazide 25 Mg Tabs (Hydrochlorothiazide) .Marland Kitchen... Take 1 tablet by mouth once a day    Metoprolol Succinate 25 Mg Xr24h-tab (Metoprolol succinate) .Marland Kitchen... 1 two times a day    Furosemide 40 Mg Tabs (Furosemide) .Marland Kitchen... 1 every other day  Problem # 2:  HYPERLIPIDEMIA-MIXED (ICD-272.4)  Her updated medication list for this problem includes:    Simvastatin 40 Mg Tabs (Simvastatin) .Marland Kitchen... 1 once daily  Her updated medication list for this problem includes:    Simvastatin 40 Mg Tabs (Simvastatin) .Marland Kitchen... 1 once daily  Problem # 3:  CORONARY ATHEROSCLEROSIS, ARTERY BYPASS GRAFT (ICD-414.04)  Her updated medication list for this problem includes:    Hydrochlorothiazide 25 Mg Tabs (Hydrochlorothiazide) .Marland Kitchen... Take 1 tablet by mouth once a day    Adult Aspirin Ec Low Strength 81 Mg Tbec (Aspirin) .Marland Kitchen... 1 once daily    Metoprolol Succinate 25 Mg Xr24h-tab (Metoprolol succinate) .Marland Kitchen... 1 two times a day    Furosemide 40 Mg Tabs (Furosemide) .Marland Kitchen... 1 every other day  Her updated medication list for this problem includes:    Hydrochlorothiazide 25 Mg Tabs (Hydrochlorothiazide) .Marland Kitchen... Take 1 tablet by mouth once a day    Adult Aspirin Ec Low Strength 81 Mg Tbec (Aspirin) .Marland Kitchen... 1 once daily    Metoprolol Succinate 25 Mg Xr24h-tab (Metoprolol succinate) .Marland Kitchen... 1 two times a day    Furosemide 40 Mg Tabs (Furosemide) .Marland Kitchen... 1 every other day  Problem # 4:  ? of RHEUMATOID ARTHRITIS (ICD-714.0)  Complete Medication List: 1)  Hydrochlorothiazide 25 Mg Tabs (Hydrochlorothiazide) .... Take 1 tablet by mouth once a day 2)  Fosamax 70 Mg Tabs (Alendronate sodium) .... Every week 3)  Multivitamin  4)  Hydrocodone-acetaminophen 5-325 Mg Tabs (Hydrocodone-acetaminophen) .Marland Kitchen.. 1 by mouth q6hrs as needed pain 5)  Adult Aspirin Ec Low Strength 81 Mg Tbec (Aspirin) .Marland Kitchen.. 1 once daily 6)  Metoprolol Succinate 25 Mg  Xr24h-tab (Metoprolol succinate) .Marland Kitchen.. 1 two times a day 7)  Simvastatin 40 Mg Tabs (Simvastatin) .Marland Kitchen.. 1 once daily 8)  Furosemide 40 Mg Tabs (Furosemide) .Marland Kitchen.. 1 every other day 9)  Vitamin D 44010 Unit Caps (Ergocalciferol) .Marland Kitchen.. 1 2x weekly 10)  Zyrtec Allergy 10 Mg Tabs (Cetirizine hcl) .Marland Kitchen.. 1 once daily as needed 11)  Hydroxyzine Hcl 25 Mg Tabs (Hydroxyzine hcl) .... As needed 12)  Hydroxychloroquine Sulfate 200 Mg Tabs (Hydroxychloroquine sulfate) .Marland Kitchen.. 1 two times a day 13)  Potassium Chloride Crys Cr 20 Meq Cr-tabs (Potassium chloride crys cr) .... One every other day 14)  Folic Acid 1 Mg Tabs (Folic acid)  Other Orders: Influenza Vaccine MCR (27[address])  Patient Instructions: 1)  Please schedule a follow-up appointment in 6 months. 2)  Limit your Sodium (Salt). 3)  It is important that you exercise regularly at least 20 minutes 5 times a week. If you develop chest pain, have severe difficulty breathing, or feel very tired , stop exercising immediately and seek medical attention. 4)  You need to lose weight. Consider a lower calorie diet and regular exercise.  Prescriptions: HYDROCODONE-ACETAMINOPHEN 5-325 MG TABS (HYDROCODONE-ACETAMINOPHEN) 1 by mouth q6hrs as needed pain  #100 x 2   Entered and Authorized by:   Gordy Savers  MD   Signed by:  Gordy Savers  MD on 09/02/2010   Method used:   Print then Give to Patient   RxID:   [id number] POTASSIUM CHLORIDE CRYS CR 20 MEQ CR-TABS (POTASSIUM CHLORIDE CRYS CR) one every other day  #90 Tablet x 3   Entered and Authorized by:   Gordy Savers  MD   Signed by:   Gordy Savers  MD on 09/02/2010   Method used:   Print then Give to Patient   RxID:   [id number] FUROSEMIDE 40 MG TABS (FUROSEMIDE) 1 every other day  #90 Tablet x 3   Entered and Authorized by:   Gordy Savers  MD   Signed by:   Gordy Savers  MD on 09/02/2010   Method used:   Print then Give to Patient   RxID:    [id number] SIMVASTATIN 40 MG TABS (SIMVASTATIN) 1 once daily  #90 Tablet x 3   Entered and Authorized by:   Gordy Savers  MD   Signed by:   Gordy Savers  MD on 09/02/2010   Method used:   Print then Give to Patient   RxID:   [id number] METOPROLOL SUCCINATE 25 MG XR24H-TAB (METOPROLOL SUCCINATE) 1 two times a day  #60 Tablet x 10   Entered and Authorized by:   Gordy Savers  MD   Signed by:   Gordy Savers  MD on 09/02/2010   Method used:   Print then Give to Patient   RxID:   [id number] FOSAMAX 70 MG  TABS (ALENDRONATE SODIUM) every week  #4 Tablet x 4   Entered and Authorized by:   Gordy Savers  MD   Signed by:   Gordy Savers  MD on 09/02/2010   Method used:   Print then Give to Patient   RxID:   [id number] HYDROCHLOROTHIAZIDE 25 MG  TABS (HYDROCHLOROTHIAZIDE) Take 1 tablet by mouth once a day  #90 Tablet x 4   Entered and Authorized by:   Gordy Savers  MD   Signed by:   Gordy Savers  MD on 09/02/2010   Method used:   Print then Give to Patient   RxID:   [id number]    Immunizations Administered:  Influenza Vaccine # 1:    Vaccine Type: Fluvax MCR    Site: left deltoid    Mfr: GlaxoSmithKline    Dose: 0.5 ml    Route: IM    Given by: Duard Brady LPN    Exp. Date: 05/24/2011    Lot #: [id number]    VIS given: 06/18/10 version given September 02, 2010.    Physician counseled: yes  Flu Vaccine Consent Questions:    Do you have a history of severe allergic reactions to this vaccine? no    Any prior history of allergic reactions to egg and/or gelatin? no    Do you have a sensitivity to the preservative Thimersol? no    Do you have a past history of Guillan-Barre Syndrome? no    Do you currently have an acute febrile illness? no    Have you ever had a severe reaction to latex? no    Vaccine information given and explained to patient? yes    Are you currently pregnant? no

## 2010-12-24 NOTE — Assessment & Plan Note (Signed)
Summary: fx ribs/dm   Vital Signs:  Patient profile:   77 year old female Weight:      166 pounds Temp:     98.2 degrees F oral BP sitting:   138 / 62  (left arm) Cuff size:   regular  Vitals Entered By: Sid Falcon LPN (October 02, 2010 2:26 PM)  History of Present Illness: Patient seen workin with fall last night around 1:30 AM. She fell while getting undressed  and landed against a table beside her bed. Bruising to right lateral rib cage area. X-rays taken this morning but not yet read. No significant pleuritic pain. Pain is moderate severity and improved with hydrocodone. No hemoptysis. She has scheduled left total knee replacement this Monday.  she has history of CAD and has been cleared by cardiologist for upcoming surgery  Allergies: 1)  ! Pcn 2)  ! * Tetanus 3)  ! Flexeril 4)  Amoxicillin (Amoxicillin)  Past History:  Past Medical History: Last updated: 09/27/2009 Coronary artery disease s/p CABG Thoracic aortic aneurysm, s/p repair Dyslipidemia Depression Osteopenia Osteoarthritis urticaria Hypertension rheumatoid arthritis left radial  neuropathy with wrist drop vitamin D deficiency  Past Surgical History: Last updated: 04/19/2009 Appendectomy Caesarean section Hernia repair status post resection and grafting of a 5.2-cm ascending aortic arch aneurysm.  August 2009 status post CABG x 1 with LIMA to LAD August 2009  status post right totally replacement surgery April 2010 air contrast B. E. January 2005  Family History: Last updated: 05/21/2007 Family History Hypertension Family History of Cardiovascular disorder  Social History: Last updated: 11/26/2008 Never Smoked No EtOH widowed  Risk Factors: Smoking Status: never (05/21/2007) PMH-FH-SH reviewed for relevance  Review of Systems  The patient denies syncope, dyspnea on exertion, prolonged cough, headaches, hemoptysis, and abdominal pain.    Physical Exam  General:   Well-developed,well-nourished,in no acute distress; alert,appropriate and cooperative throughout examination Chest Wall:  patient has area of ecchymosis 6 x 8 cm right mid axillary line region mid thoracic region. Mild to moderate tenderness to palpation. Lungs:  Normal respiratory effort, chest expands symmetrically. Lungs are clear to auscultation, no crackles or wheezes. Heart:  normal rate and regular rhythm.   Abdomen:  soft and non-tender.     Impression & Recommendations:  Problem # 1:  RIB PAIN, RIGHT SIDED (ICD-786.50) x-rays pending. Hopefully this all represents contusion.  Need to  rule out rib fractures with upcoming planned elective surgery.  Continue hydrocodone for pain relief. She is encouraged to take some deep breaths to avoid atelectasis Consider postpone surgery esp if multiple rib fxs.  Complete Medication List: 1)  Hydrochlorothiazide 25 Mg Tabs (Hydrochlorothiazide) .... Take 1 tablet by mouth once a day 2)  Fosamax 70 Mg Tabs (Alendronate sodium) .... Every week 3)  Multivitamin  .... On hold 4)  Hydrocodone-acetaminophen 5-325 Mg Tabs (Hydrocodone-acetaminophen) .Marland Kitchen.. 1 by mouth q6hrs as needed pain 5)  Adult Aspirin Ec Low Strength 81 Mg Tbec (Aspirin) .Marland Kitchen.. 1 once daily 6)  Metoprolol Succinate 50 Mg Xr24h-tab (Metoprolol succinate) .Marland Kitchen.. 1 by mouth once daily 7)  Simvastatin 40 Mg Tabs (Simvastatin) .Marland Kitchen.. 1 once daily 8)  Furosemide 40 Mg Tabs (Furosemide) .Marland Kitchen.. 1 every other day 9)  Vitamin D 95621 Unit Caps (Ergocalciferol) .Marland Kitchen.. 1 2x weekly 10)  Zyrtec Allergy 10 Mg Tabs (Cetirizine hcl) .Marland Kitchen.. 1 once daily as needed 11)  Hydroxyzine Hcl 25 Mg Tabs (Hydroxyzine hcl) .... As needed 12)  Hydroxychloroquine Sulfate 200 Mg Tabs (Hydroxychloroquine sulfate) .Marland KitchenMarland KitchenMarland Kitchen  1 two times a day 13)  Potassium Chloride Crys Cr 20 Meq Cr-tabs (Potassium chloride crys cr) .... One every other day 14)  Folic Acid 1 Mg Tabs (Folic acid) .... Take 1 tablet by mouth two times a day 15)   Methotrexate 2.5 Mg Tabs (Methotrexate sodium) .... 6 tablets once a week  Patient Instructions: 1)  Make sure you are taking some deep breaths to fully expand your lungs. 2)  Continue hydrocodone for pain relief.   Orders Added: 1)  Est. Patient Level III [[address]]

## 2010-12-24 NOTE — Letter (Signed)
Summary: Appointment - Reschedule  [facility], Main Office  [address]   [place], [place] [address]   Phone: [phone]  Fax: [phone]     November 26, 2009 MRN: [id number]   [facility] [address] [place], [place]  [address]   Dear [name],   Due to inclement weather, your appointment on 11/08/2009 at  3:30pm was cancelled. It is very important that we reach you to reschedule this appointment. We look forward to participating in your health care needs. Please contact us at the number listed above at your earliest convenience to reschedule this appointment.  incerely,  [facility] Scheduling Team

## 2010-12-24 NOTE — Miscellaneous (Signed)
Summary: Orders Update  Clinical Lists Changes  Problems: Added new problem of RIB PAIN, RIGHT SIDED (ICD-786.50) Orders: Added new Test order of T-Ribs Unilateral 2 Views (71100TC) - Signed

## 2010-12-24 NOTE — Assessment & Plan Note (Signed)
Summary: 3 MO F/U -/ appt is 11:15/ gd   Visit Type:  Follow-up Primary Provider:  Gordy Savers  MD   History of Present Illness: [name] is a 77 year old with a history of CAD and aortic aneurysm (s/p CABG and repair in Aug 2009).  Also a history of DJD, hypertension, RA, dislipidemia.  I last saw her in August. Since seen, she denies chest pain.  Breathing is good.  Still with prblems with [name] (hands) and also unsteadiness.  Current Medications (verified): 1)  Hydrochlorothiazide 25 Mg  Tabs (Hydrochlorothiazide) .... Take 1 Tablet By Mouth Once A Day 2)  Fosamax 70 Mg  Tabs (Alendronate Sodium) .... Every Week 3)  Multivitamin 4)  Hydrocodone-Acetaminophen 5-500 Mg  Tabs (Hydrocodone-Acetaminophen) .Marland Kitchen.. 1 Q6h As Needed 5)  Adult Aspirin Ec Low Strength 81 Mg Tbec (Aspirin) .Marland Kitchen.. 1 Once Daily 6)  Metoprolol Succinate 25 Mg Xr24h-Tab (Metoprolol Succinate) .Marland Kitchen.. 1 Two Times A Day 7)  Simvastatin 40 Mg Tabs (Simvastatin) .Marland Kitchen.. 1 Once Daily 8)  Furosemide 40 Mg Tabs (Furosemide) .Marland Kitchen.. 1 Every Other Day 9)  Vitamin D 16109 Unit Caps (Ergocalciferol) .Marland Kitchen.. 1 2x Weekly 10)  Zyrtec Allergy 10 Mg Tabs (Cetirizine Hcl) .Marland Kitchen.. 1 Once Daily As Needed 11)  Hydroxyzine Hcl 25 Mg Tabs (Hydroxyzine Hcl) .... As Needed 12)  Hydroxychloroquine Sulfate 200 Mg Tabs (Hydroxychloroquine Sulfate) .Marland Kitchen.. 1 Two Times A Day 13)  Potassium Chloride Crys Cr 20 Meq Cr-Tabs (Potassium Chloride Crys Cr) .... One Every Other Day  Allergies (verified): 1)  ! Pcn 2)  ! * Tetanus 3)  Amoxicillin (Amoxicillin) 4)  Amoxicillin (Amoxicillin) 5)  Amoxicillin (Amoxicillin) 6)  Tetanus Toxoid Adsorbed (Tetanus Toxoid Adsorbed) 7)  Tetanus Toxoid Adsorbed (Tetanus Toxoid Adsorbed) 8)  Tetanus Toxoid Adsorbed (Tetanus Toxoid Adsorbed)  Past History:  Past Medical History: Last updated: 09/27/2009 Coronary artery disease s/p CABG Thoracic aortic aneurysm, s/p  repair Dyslipidemia Depression Osteopenia Osteoarthritis urticaria Hypertension rheumatoid arthritis left radial  neuropathy with wrist drop vitamin D deficiency  Past Surgical History: Last updated: 04/19/2009 Appendectomy Caesarean section Hernia repair status post resection and grafting of a 5.2-cm ascending aortic arch aneurysm.  August 2009 status post CABG x 1 with LIMA to LAD August 2009  status post right totally replacement surgery April 2010 air contrast B. E. January 2005  Family History: Last updated: 05/21/2007 Family History Hypertension Family History of Cardiovascular disorder  Social History: Last updated: 11/26/2008 Never Smoked No EtOH widowed  Review of Systems       All systems reviewed.  negative to the above problem except as noted above.  Vital Signs:  Patient profile:   77 year old female Height:      63 inches Weight:      162 pounds BMI:     28.80 Pulse rate:   68 / minute BP sitting:   121 / 53  (left arm) Cuff size:   regular  Vitals Entered By: Colleen Kanaris, CNA (December 28, 2009 12:08 PM)  Physical Exam  Additional Exam:  HEENT:  Normocephalic, atraumatic. EOMI, PERRLA.  Neck: JVP is normal. No thyromegaly. No bruits.  Lungs: clear to auscultation. No rales no wheezes.  Heart: Regular rate and rhythm. Normal S1, S2. No S3.   No significant murmurs. PMI not displaced.  Abdomen:  Supple, nontender. Normal bowel sounds. No masses. No hepatomegaly.  Extremities:   Good distal pulses throughout. No lower extremity edema.  Musculoskeletal :moving all extremities.  Neuro:  alert and oriented x3.    Impression & Recommendations:  Problem # 1:  CORONARY ATHEROSCLEROSIS, ARTERY BYPASS GRAFT (ICD-414.04) Doing well.  No sxs to suggest angina.  Continue meds.  Problem # 2:  HYPERTENSION (ICD-401.9) Good control.  Check BMET.  Problem # 3:  HYPERLIPIDEMIA-MIXED (ICD-272.4) Will need to check fasting.  Problem # 4:  ? of  RHEUMATOID ARTHRITIS (ICD-714.0) F/U with rheumatology  Problem # 5:  OSTEOARTHRITIS (ICD-715.90) Encouraged her to get in a stength/flexibility program.

## 2010-12-24 NOTE — Progress Notes (Signed)
Summary: legs swelling  Phone Note Call from Patient   Caller: Patient [phone] Reason for Call: Talk to Nurse Summary of Call: pt d/c from hosptial wednesday and her legs are swollen-they were swollen prior to surgery, an [place] was done to r/o blood clot-none was seen-but concerned she is still having the swelling-also wanted to know if she should be wearing the hose-pls call [phone] Initial call taken by: Glynda Jaeger,  May 24, 2010 2:57 PM  Follow-up for Phone Call        Called patient back... She states that she had lower extremity edema prior to surgery and she is aware that vascular study showed no blood clot. Advised her to take Lasix40mg  with KCL20mg  every other day as prescribed and to wear the support hose all day and take off at night. She mentioned a rash on her legs after taking Flexeril which is now resolving because she has the medication on hold right now. Advised her to let the surgeon know about this. She has pain medication to take if she needs it. She asked what she could take for constipation since pain medication is causing her to have constipation....advised Colace 1 two times per day as needed. Will let Dr.Dechelle Attaway know that she called. Follow-up by: J REISS RN     Appended Document: legs swelling Make sure she has f/u with me or Dr. Kirtland Bouchard.  Appended Document: legs swelling Has ROV with Dr.K 10/02/2010. Call backs for Dr.PR 08/2010.

## 2010-12-24 NOTE — Progress Notes (Signed)
Summary: [facility] req verification on strength of med  Phone Note From Pharmacy Call back at [phone]  [facility]    [facility]  Summary of Call: Rcvd a refill for Hydrododone 5/500mg  but pt is showing she has been on 5/325 all along. Pls call pharmacy to verify.  Initial call taken by: Lucy Antigua,  June 07, 2010 11:10 AM    New/Updated Medications: HYDROCODONE-ACETAMINOPHEN 5-325 MG TABS (HYDROCODONE-ACETAMINOPHEN) 1 by mouth q6hrs as needed pain Prescriptions: HYDROCODONE-ACETAMINOPHEN 5-325 MG TABS (HYDROCODONE-ACETAMINOPHEN) 1 by mouth q6hrs as needed pain  #100 x 2   Entered by:   Duard Brady LPN   Authorized by:   Gordy Savers  MD   Signed by:   Duard Brady LPN on 21/30/8657   Method used:   Historical   RxID:   [id number]

## 2010-12-24 NOTE — Letter (Signed)
Summary: [facility]  [facility]   Imported By: Maryln Gottron 06/11/2010 13:35:52  _____________________________________________________________________  External Attachment:    Type:   Image     Comment:   External Document

## 2010-12-24 NOTE — Letter (Signed)
Summary: [facility]  [facility]   Imported By: Marylou Mccoy 06/19/2010 14:35:20  _____________________________________________________________________  External Attachment:    Type:   Image     Comment:   External Document

## 2010-12-24 NOTE — Letter (Signed)
Summary: [facility]  [facility]   Imported By: Maryln Gottron 09/12/2010 14:12:53  _____________________________________________________________________  External Attachment:    Type:   Image     Comment:   External Document

## 2010-12-24 NOTE — Letter (Signed)
Summary: [facility]  [facility]   Imported By: Maryln Gottron 01/14/2010 09:41:16  _____________________________________________________________________  External Attachment:    Type:   Image     Comment:   External Document

## 2010-12-24 NOTE — Miscellaneous (Signed)
  Clinical Lists Changes  Observations: Added new observation of DOPPLER LEG: - No evidence of deep vein or superficial thrombosis involving the   right lower extremity and left lower extremity. - No evidence of Baker's cyst on the right or left. - There appears to be a non vascularized structure withhomogeneous   internal echoesin the right groin of unknown clinical significance   or etiology. (05/22/2009 15:13)      Venous Doppler  Procedure date:  05/22/2009  Findings:      - No evidence of deep vein or superficial thrombosis involving the   right lower extremity and left lower extremity. - No evidence of Baker's cyst on the right or left. - There appears to be a non vascularized structure withhomogeneous   internal echoesin the right groin of unknown clinical significance   or etiology.

## 2010-12-24 NOTE — Assessment & Plan Note (Signed)
Summary: sur   Visit Type:  Follow-up Primary Provider:  Gordy Savers  MD  CC:  Surgical clearance total left knee replacement.  History of Present Illness: [name] is a 77 year old with a history of CAD and aortic aneurysm (s/p CABG and repair in Aug 2009).  Also a history of DJD, hypertension, RA, dislipidemia.  I last saw her in last February. Since seen she has done well from a cardiac standpoint.  She has never had chst pain.  Breathing is OK.  She does get winded if she pushes herself but this has not changed. Being evaluated for knee surgery.    Current Medications (verified): 1)  Hydrochlorothiazide 25 Mg  Tabs (Hydrochlorothiazide) .... Take 1 Tablet By Mouth Once A Day 2)  Fosamax 70 Mg  Tabs (Alendronate Sodium) .... Every Week 3)  Multivitamin .... On Hold 4)  Hydrocodone-Acetaminophen 5-325 Mg Tabs (Hydrocodone-Acetaminophen) .Marland Kitchen.. 1 By Mouth Q6hrs As Needed Pain 5)  Adult Aspirin Ec Low Strength 81 Mg Tbec (Aspirin) .Marland Kitchen.. 1 Once Daily 6)  Metoprolol Succinate 25 Mg Xr24h-Tab (Metoprolol Succinate) .Marland Kitchen.. 1 Two Times A Day 7)  Simvastatin 40 Mg Tabs (Simvastatin) .Marland Kitchen.. 1 Once Daily 8)  Furosemide 40 Mg Tabs (Furosemide) .Marland Kitchen.. 1 Every Other Day 9)  Vitamin D 16109 Unit Caps (Ergocalciferol) .Marland Kitchen.. 1 2x Weekly 10)  Zyrtec Allergy 10 Mg Tabs (Cetirizine Hcl) .Marland Kitchen.. 1 Once Daily As Needed 11)  Hydroxyzine Hcl 25 Mg Tabs (Hydroxyzine Hcl) .... As Needed 12)  Hydroxychloroquine Sulfate 200 Mg Tabs (Hydroxychloroquine Sulfate) .Marland Kitchen.. 1 Two Times A Day 13)  Potassium Chloride Crys Cr 20 Meq Cr-Tabs (Potassium Chloride Crys Cr) .... One Every Other Day 14)  Folic Acid 1 Mg Tabs (Folic Acid) .... Take 1 Tablet By Mouth Two Times A Day 15)  Methotrexate 2.5 Mg Tabs (Methotrexate Sodium) .... 6 Tablets Once A Week  Allergies: 1)  ! Pcn 2)  ! * Tetanus 3)  ! Flexeril 4)  Amoxicillin (Amoxicillin)  Past History:  Past medical, surgical, family and social histories (including risk  factors) reviewed, and no changes noted (except as noted below).  Past Medical History: Reviewed history from 09/27/2009 and no changes required. Coronary artery disease s/p CABG Thoracic aortic aneurysm, s/p repair Dyslipidemia Depression Osteopenia Osteoarthritis urticaria Hypertension rheumatoid arthritis left radial  neuropathy with wrist drop vitamin D deficiency  Past Surgical History: Reviewed history from 04/19/2009 and no changes required. Appendectomy Caesarean section Hernia repair status post resection and grafting of a 5.2-cm ascending aortic arch aneurysm.  August 2009 status post CABG x 1 with LIMA to LAD August 2009  status post right totally replacement surgery April 2010 air contrast B. E. January 2005  Family History: Reviewed history from 05/21/2007 and no changes required. Family History Hypertension Family History of Cardiovascular disorder  Social History: Reviewed history from 11/26/2008 and no changes required. Never Smoked No EtOH widowed  Review of Systems       All systems reviewed.  Neg to the above problem except as noted above.  Does note unsteadiness with backing up  Hx of falls.  Vital Signs:  Patient profile:   77 year old female Height:      62 inches Weight:      166 pounds BMI:     30.47 Pulse rate:   69 / minute Pulse rhythm:   regular Resp:     18 per minute BP sitting:   143 / 69  (left arm) Cuff size:  large  Vitals Entered By: Vikki Ports (September 13, 2010 12:20 PM)  Physical Exam  Additional Exam:  Patient is in NAD HEENT:  Normocephalic, atraumatic. EOMI, PERRLA.  Neck: JVP is normal. No thyromegaly. No bruits.  Lungs: clear to auscultation. No rales no wheezes.  Heart: Regular rate and rhythm. Normal S1, S2. No S3.   No significant murmurs. PMI not displaced.  Abdomen:  Supple, nontender. Normal bowel sounds. No masses. No hepatomegaly.  Extremities:   Good distal pulses throughout.  Tr LE edema Left.   Varicosities noted. Musculoskeletal :moving all extremities.  Neuro:   alert and oriented x3.    Impression & Recommendations:  Problem # 1:  CORONARY ATHEROSCLEROSIS, ARTERY BYPASS GRAFT (ICD-414.04) No sxs to suggest active ischemia.  She has never had chest pains  But she notes no change in her activity levels from SOB.  (does have DJD that limits) I would not make any changes.   I think, from a cardiac standpoint she is at low risk for planned surgery (L TKR)  OK to proceed without further testing.   Patient is due to see P VanTrigt in 2012.  Problem # 2:  HYPERLIPIDEMIA-MIXED (ICD-272.4) Will need to review last panel Her updated medication list for this problem includes:    Simvastatin 40 Mg Tabs (Simvastatin) .Marland Kitchen... 1 once daily  Problem # 3:  HYPERTENSION (ICD-401.9) BP is a little higher than normal lper her report.  I would not change meds.  Problem # 4:  LEG EDEMA, BILATERAL (ICD-782.3) Prob secondary to venous stasis.  Rec supprt hose.  Has not taken lasix today.  Patient Instructions: 1)  Your physician recommends that you schedule a follow-up appointment in: Early spring 2012. 2)  Your physician recommends that you continue on your current medications as directed. Please refer to the Current Medication list given to you today.

## 2010-12-24 NOTE — Letter (Signed)
Summary: [facility]  [facility]   Imported By: Maryln Gottron 01/17/2010 12:29:17  _____________________________________________________________________  External Attachment:    Type:   Image     Comment:   External Document

## 2010-12-24 NOTE — Progress Notes (Signed)
Summary: refill hydrocodone - acetaminophen  Phone Note Refill Request Message from:  Fax from Pharmacy on June 06, 2010 4:50 PM  Refills Requested: Medication #1:  HYDROCODONE-ACETAMINOPHEN 5-500 MG  TABS 1 q6h as needed   [phone]   Method Requested: Telephone to Pharmacy Initial call taken by: Duard Brady LPN,  June 06, 2010 4:51 PM  Follow-up for Phone Call        #100  RF 2 Follow-up by: Gordy Savers  MD,  June 06, 2010 4:54 PM  Additional Follow-up for Phone Call Additional follow up Details #1::        [facility] Additional Follow-up by: Duard Brady LPN,  June 06, 2010 5:13 PM    Prescriptions: HYDROCODONE-ACETAMINOPHEN 5-500 MG  TABS (HYDROCODONE-ACETAMINOPHEN) 1 q6h as needed  #100 x 2   Entered by:   Duard Brady LPN   Authorized by:   Gordy Savers  MD   Signed by:   Duard Brady LPN on 05/23/1600   Method used:   Historical   RxID:   [id number]

## 2010-12-24 NOTE — Letter (Signed)
Summary: [facility]   [facility]   Imported By: Maryln Gottron 08/05/2010 11:31:06  _____________________________________________________________________  External Attachment:    Type:   Image     Comment:   External Document

## 2010-12-24 NOTE — Assessment & Plan Note (Signed)
Summary: 6 month rov/nrj   Vital Signs:  Patient profile:   77 year old female Weight:      169 pounds Temp:     97.4 degrees F oral BP sitting:   130 / 80  (left arm) Cuff size:   regular  Vitals Entered By: Duard Brady LPN (Mar 27, 1609 10:19 AM)  Primary Care Provider:  Gordy Savers  MD   History of Present Illness: 77 year old patient who is seen today for follow up.  she is followed multiple consultants, including cardiology,  rheumatology,   Allergy GYN orthopedics.  She is now on methotrexate therapy for her rheumatoid arthritis.  She has been scheduled to see neurosurgery apparently for a gait abnormality.  History of hypertension, dyslipidemia, and coronary artery disease.  She has done recently, well does have a worsening left knee pain with the overuse.  She has left ankle edema controlled with p.r.n., furosemide.  Allergies: 1)  ! Pcn 2)  ! * Tetanus 3)  Amoxicillin (Amoxicillin)  Past History:  Past Medical History: Reviewed history from 09/27/2009 and no changes required. Coronary artery disease s/p CABG Thoracic aortic aneurysm, s/p repair Dyslipidemia Depression Osteopenia Osteoarthritis urticaria Hypertension rheumatoid arthritis left radial  neuropathy with wrist drop vitamin D deficiency  Review of Systems       The patient complains of peripheral edema, muscle weakness, and difficulty walking.  The patient denies anorexia, fever, weight loss, weight gain, vision loss, decreased hearing, hoarseness, chest pain, syncope, dyspnea on exertion, prolonged cough, headaches, hemoptysis, abdominal pain, melena, hematochezia, severe indigestion/heartburn, hematuria, incontinence, genital sores, suspicious skin lesions, transient blindness, depression, unusual weight change, abnormal bleeding, enlarged lymph nodes, angioedema, and breast masses.    Physical Exam  General:  overweight-appearing.  130/80 Head:  Normocephalic and atraumatic without  obvious abnormalities. No apparent alopecia or balding. Eyes:  No corneal or conjunctival inflammation noted. EOMI. Perrla. Funduscopic exam benign, without hemorrhages, exudates or papilledema. Vision grossly normal. Mouth:  Oral mucosa and oropharynx without lesions or exudates.  Teeth in good repair. Neck:  No deformities, masses, or tenderness noted. Lungs:  Normal respiratory effort, chest expands symmetrically. Lungs are clear to auscultation, no crackles or wheezes. Heart:  Normal rate and regular rhythm. S1 and S2 normal without gallop, murmur, click, rub or other extra sounds. Abdomen:  Bowel sounds positive,abdomen soft and non-tender without masses, organomegaly or hernias noted. Pulses:  pedal pulses intact Extremities:  2+ left pedal edema.  2+ left pedal edema.     Impression & Recommendations:  Problem # 1:  HYPERTENSION (ICD-401.9)  Her updated medication list for this problem includes:    Hydrochlorothiazide 25 Mg Tabs (Hydrochlorothiazide) .Marland Kitchen... Take 1 tablet by mouth once a day    Metoprolol Succinate 25 Mg Xr24h-tab (Metoprolol succinate) .Marland Kitchen... 1 two times a day    Furosemide 40 Mg Tabs (Furosemide) .Marland Kitchen... 1 every other day    Minoxidil 10 Mg Tab (Minoxidil) .Marland Kitchen... Take 1 tablet by mouth once a day  Her updated medication list for this problem includes:    Hydrochlorothiazide 25 Mg Tabs (Hydrochlorothiazide) .Marland Kitchen... Take 1 tablet by mouth once a day    Metoprolol Succinate 25 Mg Xr24h-tab (Metoprolol succinate) .Marland Kitchen... 1 two times a day    Furosemide 40 Mg Tabs (Furosemide) .Marland Kitchen... 1 every other day    Minoxidil 10 Mg Tab (Minoxidil) .Marland Kitchen... Take 1 tablet by mouth once a day  Problem # 2:  HYPERLIPIDEMIA-MIXED (ICD-272.4)  Her updated medication  list for this problem includes:    Simvastatin 40 Mg Tabs (Simvastatin) .Marland Kitchen... 1 once daily  Her updated medication list for this problem includes:    Simvastatin 40 Mg Tabs (Simvastatin) .Marland Kitchen... 1 once daily  Problem # 3:  CORONARY  ATHEROSCLEROSIS, ARTERY BYPASS GRAFT (ICD-414.04)  Her updated medication list for this problem includes:    Hydrochlorothiazide 25 Mg Tabs (Hydrochlorothiazide) .Marland Kitchen... Take 1 tablet by mouth once a day    Adult Aspirin Ec Low Strength 81 Mg Tbec (Aspirin) .Marland Kitchen... 1 once daily    Metoprolol Succinate 25 Mg Xr24h-tab (Metoprolol succinate) .Marland Kitchen... 1 two times a day    Furosemide 40 Mg Tabs (Furosemide) .Marland Kitchen... 1 every other day    Minoxidil 10 Mg Tab (Minoxidil) .Marland Kitchen... Take 1 tablet by mouth once a day  Her updated medication list for this problem includes:    Hydrochlorothiazide 25 Mg Tabs (Hydrochlorothiazide) .Marland Kitchen... Take 1 tablet by mouth once a day    Adult Aspirin Ec Low Strength 81 Mg Tbec (Aspirin) .Marland Kitchen... 1 once daily    Metoprolol Succinate 25 Mg Xr24h-tab (Metoprolol succinate) .Marland Kitchen... 1 two times a day    Furosemide 40 Mg Tabs (Furosemide) .Marland Kitchen... 1 every other day    Minoxidil 10 Mg Tab (Minoxidil) .Marland Kitchen... Take 1 tablet by mouth once a day  Complete Medication List: 1)  Hydrochlorothiazide 25 Mg Tabs (Hydrochlorothiazide) .... Take 1 tablet by mouth once a day 2)  Fosamax 70 Mg Tabs (Alendronate sodium) .... Every week 3)  Multivitamin  4)  Hydrocodone-acetaminophen 5-500 Mg Tabs (Hydrocodone-acetaminophen) .Marland Kitchen.. 1 q6h as needed 5)  Adult Aspirin Ec Low Strength 81 Mg Tbec (Aspirin) .Marland Kitchen.. 1 once daily 6)  Metoprolol Succinate 25 Mg Xr24h-tab (Metoprolol succinate) .Marland Kitchen.. 1 two times a day 7)  Simvastatin 40 Mg Tabs (Simvastatin) .Marland Kitchen.. 1 once daily 8)  Furosemide 40 Mg Tabs (Furosemide) .Marland Kitchen.. 1 every other day 9)  Vitamin D 01093 Unit Caps (Ergocalciferol) .Marland Kitchen.. 1 2x weekly 10)  Zyrtec Allergy 10 Mg Tabs (Cetirizine hcl) .Marland Kitchen.. 1 once daily as needed 11)  Hydroxyzine Hcl 25 Mg Tabs (Hydroxyzine hcl) .... As needed 12)  Hydroxychloroquine Sulfate 200 Mg Tabs (Hydroxychloroquine sulfate) .Marland Kitchen.. 1 two times a day 13)  Potassium Chloride Crys Cr 20 Meq Cr-tabs (Potassium chloride crys cr) .... One every other  day 14)  Folic Acid 1 Mg Tabs (Folic acid) 15)  Minoxidil 10 Mg Tab (Minoxidil) .... Take 1 tablet by mouth once a day  Patient Instructions: 1)  Please schedule a follow-up appointment in 6 months. 2)  Limit your Sodium (Salt) to less than 2 grams a day(slightly less than 1/2 a teaspoon) to prevent fluid retention, swelling, or worsening of symptoms. 3)  It is important that you exercise regularly at least 20 minutes 5 times a week. If you develop chest pain, have severe difficulty breathing, or feel very tired , stop exercising immediately and seek medical attention. 4)  You need to lose weight. Consider a lower calorie diet and regular exercise.  5)  Take calcium +Vitamin D daily.

## 2010-12-24 NOTE — Progress Notes (Signed)
Summary: QUESTION ON MEDS  Phone Note Call from Patient Call back at [phone]   Caller: Patient Reason for Call: Talk to Nurse Summary of Call: PT HAS QUESTION ON MEDS Initial call taken by: Roe Coombs,  September 25, 2010 4:31 PM  Follow-up for Phone Call        left message for callback. Follow-up by: Claris Gladden RN,  September 26, 2010 9:02 AM     Appended Document: QUESTION ON MEDS LMOM for call back.  Appended Document: QUESTION ON MEDS Called work phone and she was not there. Called home phone and mailbox was full. Checked E chart because patient was scheduled for knee surgery and she did have surgery on 11/16 at [facility]

## 2010-12-24 NOTE — Progress Notes (Signed)
Summary: REQ FOR RETURN CALL  Phone Note Call from Patient   Caller: Patient   [phone] Reason for Call: Talk to Nurse, Talk to Doctor Summary of Call: Pt called to speak with Dr Kirtland Bouchard... Pt advised that she is scheduled to have surgery on c-spine (by Dr Donalee Citrin) on June 27th, 2011.... Pt needs to know about medical clearance for the procedure and would like to speak with Dr Kirtland Bouchard / Selena Batten, RN about it.Marland KitchenMarland KitchenMarland KitchenPt to call Dr Dola Argyle office to have form faxed to [facility].... Pt req a return call at [phone] or [phone]  at your convenience.   Initial call taken by: Debbra Riding,  May 09, 2010 4:14 PM  Follow-up for Phone Call        Pt  wants to know if the clearance form is here for Dr Kirtland Bouchard to sign? Follow-up by: Warnell Forester,  May 09, 2010 4:30 PM  Additional Follow-up for Phone Call Additional follow up Details #1::        noted patient called forms to be completed Additional Follow-up by: Gordy Savers  MD,  May 10, 2010 12:32 PM     Appended Document: REQ FOR RETURN CALL called pt - forms completed   KIK

## 2010-12-25 ENCOUNTER — Inpatient Hospital Stay (HOSPITAL_COMMUNITY)
Admission: RE | Admit: 2010-12-25 | Discharge: 2010-12-28 | DRG: 470 | Disposition: A | Discharge status: Skilled Nursing Facility | Payer: Medicare Other | Attending: Orthopedic Surgery | Admitting: Orthopedic Surgery

## 2010-12-25 DIAGNOSIS — Z87440 Personal history of urinary (tract) infections: Secondary | ICD-10-CM

## 2010-12-25 DIAGNOSIS — M21069 Valgus deformity, not elsewhere classified, unspecified knee: Secondary | ICD-10-CM | POA: Diagnosis present

## 2010-12-25 DIAGNOSIS — E876 Hypokalemia: Secondary | ICD-10-CM | POA: Diagnosis not present

## 2010-12-25 DIAGNOSIS — I2510 Atherosclerotic heart disease of native coronary artery without angina pectoris: Secondary | ICD-10-CM | POA: Diagnosis present

## 2010-12-25 DIAGNOSIS — D62 Acute posthemorrhagic anemia: Secondary | ICD-10-CM | POA: Diagnosis not present

## 2010-12-25 DIAGNOSIS — Z951 Presence of aortocoronary bypass graft: Secondary | ICD-10-CM

## 2010-12-25 DIAGNOSIS — K219 Gastro-esophageal reflux disease without esophagitis: Secondary | ICD-10-CM | POA: Diagnosis present

## 2010-12-25 DIAGNOSIS — M069 Rheumatoid arthritis, unspecified: Secondary | ICD-10-CM | POA: Diagnosis present

## 2010-12-25 DIAGNOSIS — R011 Cardiac murmur, unspecified: Secondary | ICD-10-CM | POA: Diagnosis present

## 2010-12-25 DIAGNOSIS — I10 Essential (primary) hypertension: Secondary | ICD-10-CM | POA: Diagnosis present

## 2010-12-25 DIAGNOSIS — E7800 Pure hypercholesterolemia, unspecified: Secondary | ICD-10-CM | POA: Diagnosis present

## 2010-12-25 DIAGNOSIS — M1710 Unilateral primary osteoarthritis, unspecified knee: Principal | ICD-10-CM | POA: Diagnosis present

## 2010-12-25 DIAGNOSIS — N390 Urinary tract infection, site not specified: Secondary | ICD-10-CM | POA: Diagnosis present

## 2010-12-25 DIAGNOSIS — Z8701 Personal history of pneumonia (recurrent): Secondary | ICD-10-CM

## 2010-12-25 LAB — TYPE AND SCREEN
ABO/RH(D): B NEG
Antibody Screen: NEGATIVE

## 2010-12-26 LAB — BASIC METABOLIC PANEL
BUN: 8 mg/dL (ref 6–23)
CO2: 29 mEq/L (ref 19–32)
Calcium: 8.4 mg/dL (ref 8.4–10.5)
Creatinine, Ser: 0.61 mg/dL (ref 0.4–1.2)
GFR calc non Af Amer: >60 mL/min (ref >60)
Glucose, Bld: 127 mg/dL — ABNORMAL HIGH (ref 70–99)
Sodium: 136 mEq/L (ref 135–145)

## 2010-12-26 LAB — CBC
MCH: 30.5 pg (ref 26.0–34.0)
MCV: 89.1 fL (ref 78.0–100.0)
Platelets: 191 10*3/uL (ref 150–400)
RBC: 3.31 MIL/uL — ABNORMAL LOW (ref 3.87–5.11)
RDW: 14.9 % (ref 11.5–15.5)

## 2010-12-26 LAB — PROTIME-INR: Prothrombin Time: 14.5 seconds (ref 11.6–15.2)

## 2010-12-26 NOTE — Progress Notes (Signed)
Summary: pt rtn your call  Phone Note Call from Patient Call back at Home Phone [phone] Call back at cell # [phone]   Caller: Patient Reason for Call: Talk to Nurse, Talk to Doctor Summary of Call: pt rtn call about surgical clearence and she will be home for about an hour then you can reach her on her cell number/lg Initial call taken by: Omer Jack,  December 16, 2010 8:28 AM  Follow-up for Phone Call        [facility] for call back  Layne Benton, RN, BSN  December 16, 2010 10:49 AM   Additional Follow-up for Phone Call Additional follow up Details #1::        Spoke with patient ....she states that she has not been having any chest pain. OK for knee surgery on 2/1 per Dr.Daichi Moris and she doses not need to come in for surgical clearance appointment. Patient verbalized understanding.  Layne Benton, RN, BSN  December 16, 2010 11:02 AM

## 2010-12-26 NOTE — Progress Notes (Signed)
Summary: pt needs surgical clearence  Phone Note Call from Patient Call back at Home Phone [phone] Call back at [phone]   Caller: Patient Reason for Call: Talk to Nurse, Talk to Doctor Summary of Call: pt needs surgical clearence for knee replacement with Dr. Lenda Kelp Initial call taken by: Omer Jack,  December 12, 2010 4:58 PM  Follow-up for Phone Call        [facility] for call back on cell phone and house phone.  Layne Benton, RN, BSN  December 12, 2010 5:04 PM   Additional Follow-up for Phone Call Additional follow up Details #1::        patient seen in October.  Doing well from cardiac standpoint.  Had CABG in 2009.  Unless she is having chest pains I feel she is at low risk for cardiac event with orthopedic surgery.  OK to proceed. Additional Follow-up by: Sherrill Raring, MD, [facility],  December 12, 2010 10:09 PM

## 2010-12-26 NOTE — Progress Notes (Addendum)
Summary: Calling about surgary  Phone Note Call from Patient Call back at Home Phone [phone] Call back at [phone]   Caller: Patient Summary of Call: Returning call about surgery Initial call taken by: Judie Grieve,  December 13, 2010 1:42 PM  Follow-up for Phone Call        Called and [facility] for call back.  Layne Benton, RN, BSN  December 13, 2010 3:54 PM   Additional Follow-up for Phone Call Additional follow up Details #1::        I dictated note for this already Additional Follow-up by: Sherrill Raring, MD, [facility],  December 13, 2010 4:38 PM     Appended Document: Calling about surgary See note from 12/16/2010.

## 2010-12-26 NOTE — Assessment & Plan Note (Signed)
Summary: medical clearance/dm   Vital Signs:  Patient profile:   77 year old female Weight:      178 pounds O2 Sat:      95 % on Room air Temp:     98.0 degrees F oral Pulse rate:   74 / minute BP sitting:   130 / 70  (right arm) Cuff size:   regular  Vitals Entered By: Duard Brady LPN (December 19, 2010 1:22 PM)  O2 Flow:  Room air CC: medical clearence for surg.  Is Patient Diabetic? No   Primary Care Provider:  Gordy Savers  MD  CC:  medical clearence for surg. Marland Kitchen  History of Present Illness: 77 -year-old patient who is seen today preoperatively.  She is scheduled for right total knee replacement surgery next week.  She has a history of coronary artery disease and is status post CABG.  She is followed by cardiology and has been quite stable.  Denies any exertional chest pain, although her activity is limited by her gait instability and arthritis.  No dyspnea .  No symptoms of congestive heart failure.  She has a mild chronic peripheral edema especially on the left which is unchanged.  She has treated hypertension, which remained stable.  She remains on simvastatin 40 mg daily, which he tolerates well  Allergies: 1)  ! Pcn 2)  ! * Tetanus 3)  ! Flexeril 4)  Amoxicillin (Amoxicillin)  Past History:  Past Medical History: Reviewed history from 09/27/2009 and no changes required. Coronary artery disease s/p CABG Thoracic aortic aneurysm, s/p repair Dyslipidemia Depression Osteopenia Osteoarthritis urticaria Hypertension rheumatoid arthritis left radial  neuropathy with wrist drop vitamin D deficiency  Past Surgical History: Reviewed history from 04/19/2009 and no changes required. Appendectomy Caesarean section Hernia repair status post resection and grafting of a 5.2-cm ascending aortic arch aneurysm.  August 2009 status post CABG x 1 with LIMA to LAD August 2009  status post right totally replacement surgery April 2010 air contrast B. E. January  2005  Family History: Reviewed history from 05/21/2007 and no changes required. Family History Hypertension Family History of Cardiovascular disorder  Social History: Reviewed history from 11/26/2008 and no changes required. Never Smoked No EtOH widowed  Review of Systems       The patient complains of peripheral edema and difficulty walking.  The patient denies anorexia, fever, weight loss, weight gain, vision loss, decreased hearing, hoarseness, chest pain, syncope, dyspnea on exertion, prolonged cough, headaches, hemoptysis, abdominal pain, melena, hematochezia, severe indigestion/heartburn, hematuria, incontinence, genital sores, muscle weakness, suspicious skin lesions, transient blindness, depression, unusual weight change, abnormal bleeding, enlarged lymph nodes, angioedema, and breast masses.    Physical Exam  General:  overweight-appearing.  120/70 Head:  Normocephalic and atraumatic without obvious abnormalities. No apparent alopecia or balding. Eyes:  No corneal or conjunctival inflammation noted. EOMI. Perrla. Funduscopic exam benign, without hemorrhages, exudates or papilledema. Vision grossly normal. Ears:  External ear exam shows no significant lesions or deformities.  Otoscopic examination reveals clear canals, tympanic membranes are intact bilaterally without bulging, retraction, inflammation or discharge. Hearing is grossly normal bilaterally. Mouth:  Oral mucosa and oropharynx without lesions or exudates.  Teeth in good repair. Neck:  No deformities, masses, or tenderness noted. Lungs:  Normal respiratory effort, chest expands symmetrically. Lungs are clear to auscultation, no crackles or wheezes. Heart:  Normal rate and regular rhythm. S1 and S2 normal without gallop, murmur, click, rub or other extra sounds. Abdomen:  Bowel sounds positive,abdomen  soft and non-tender without masses, organomegaly or hernias noted. Msk:  No deformity or scoliosis noted of thoracic or  lumbar spine.   Pulses:  R and L carotid,radial,femoral,dorsalis pedis and posterior tibial pulses are full and equal bilaterally Extremities:  1+ left pedal edema.   Skin:  Intact without suspicious lesions or rashes Cervical Nodes:  No lymphadenopathy noted Psych:  Cognition and judgment appear intact. Alert and cooperative with normal attention span and concentration. No apparent delusions, illusions, hallucinations   Impression & Recommendations:  Problem # 1:  CORONARY ATHEROSCLEROSIS, ARTERY BYPASS GRAFT (ICD-414.04)  Her updated medication list for this problem includes:    Hydrochlorothiazide 25 Mg Tabs (Hydrochlorothiazide) .Marland Kitchen... Take 1 tablet by mouth once a day    Adult Aspirin Ec Low Strength 81 Mg Tbec (Aspirin) .Marland Kitchen... 1 once daily    Metoprolol Succinate 50 Mg Xr24h-tab (Metoprolol succinate) .Marland Kitchen... 1 by mouth once daily    Furosemide 40 Mg Tabs (Furosemide) .Marland Kitchen... 1 every other day  Problem # 2:  HYPERTENSION (ICD-401.9)  Her updated medication list for this problem includes:    Hydrochlorothiazide 25 Mg Tabs (Hydrochlorothiazide) .Marland Kitchen... Take 1 tablet by mouth once a day    Metoprolol Succinate 50 Mg Xr24h-tab (Metoprolol succinate) .Marland Kitchen... 1 by mouth once daily    Furosemide 40 Mg Tabs (Furosemide) .Marland Kitchen... 1 every other day  Problem # 3:  OSTEOARTHRITIS (ICD-715.90)  Her updated medication list for this problem includes:    Hydrocodone-acetaminophen 5-325 Mg Tabs (Hydrocodone-acetaminophen) .Marland Kitchen... 1 by mouth q6hrs as needed pain    Adult Aspirin Ec Low Strength 81 Mg Tbec (Aspirin) .Marland Kitchen... 1 once daily  Complete Medication List: 1)  Hydrochlorothiazide 25 Mg Tabs (Hydrochlorothiazide) .... Take 1 tablet by mouth once a day 2)  Fosamax 70 Mg Tabs (Alendronate sodium) .... Every week 3)  Multivitamin  .... On hold 4)  Hydrocodone-acetaminophen 5-325 Mg Tabs (Hydrocodone-acetaminophen) .Marland Kitchen.. 1 by mouth q6hrs as needed pain 5)  Adult Aspirin Ec Low Strength 81 Mg Tbec (Aspirin)  .Marland Kitchen.. 1 once daily 6)  Metoprolol Succinate 50 Mg Xr24h-tab (Metoprolol succinate) .Marland Kitchen.. 1 by mouth once daily 7)  Simvastatin 40 Mg Tabs (Simvastatin) .Marland Kitchen.. 1 once daily 8)  Furosemide 40 Mg Tabs (Furosemide) .Marland Kitchen.. 1 every other day 9)  Vitamin D 16109 Unit Caps (Ergocalciferol) .Marland Kitchen.. 1 2x weekly 10)  Zyrtec Allergy 10 Mg Tabs (Cetirizine hcl) .Marland Kitchen.. 1 once daily as needed 11)  Hydroxyzine Hcl 25 Mg Tabs (Hydroxyzine hcl) .... As needed 12)  Hydroxychloroquine Sulfate 200 Mg Tabs (Hydroxychloroquine sulfate) .Marland Kitchen.. 1 two times a day 13)  Potassium Chloride Crys Cr 20 Meq Cr-tabs (Potassium chloride crys cr) .... One every other day 14)  Folic Acid 1 Mg Tabs (Folic acid) .... Take 1 tablet by mouth two times a day 15)  Methotrexate 2.5 Mg Tabs (Methotrexate sodium) .... 6 tablets once a week  Patient Instructions: 1)  Please schedule a follow-up appointment in 6 months. 2)  Limit your Sodium (Salt). 3)  It is important that you exercise regularly at least 20 minutes 5 times a week. If you develop chest pain, have severe difficulty breathing, or feel very tired , stop exercising immediately and seek medical attention. 4)  You need to lose weight. Consider a lower calorie diet and regular exercise.  Prescriptions: POTASSIUM CHLORIDE CRYS CR 20 MEQ CR-TABS (POTASSIUM CHLORIDE CRYS CR) one every other day  #90 Tablet x 3   Entered and Authorized by:   Gordy Savers  MD  Signed by:   Gordy Savers  MD on 12/19/2010   Method used:   Print then Give to Patient   RxID:   [id number] SIMVASTATIN 40 MG TABS (SIMVASTATIN) 1 once daily  #90 Tablet x 3   Entered and Authorized by:   Gordy Savers  MD   Signed by:   Gordy Savers  MD on 12/19/2010   Method used:   Print then Give to Patient   RxID:   [id number] METOPROLOL SUCCINATE 50 MG XR24H-TAB (METOPROLOL SUCCINATE) 1 by mouth once daily  #90 x 10   Entered and Authorized by:   Gordy Savers  MD   Signed by:    Gordy Savers  MD on 12/19/2010   Method used:   Print then Give to Patient   RxID:   [id number] HYDROCODONE-ACETAMINOPHEN 5-325 MG TABS (HYDROCODONE-ACETAMINOPHEN) 1 by mouth q6hrs as needed pain  #100 x 2   Entered and Authorized by:   Gordy Savers  MD   Signed by:   Gordy Savers  MD on 12/19/2010   Method used:   Print then Give to Patient   RxID:   [id number]    Orders Added: 1)  Est. Patient Level IV [64332]

## 2010-12-27 LAB — URINALYSIS, ROUTINE W REFLEX MICROSCOPIC
Bilirubin Urine: NEGATIVE
Ketones, ur: NEGATIVE mg/dL
Nitrite: NEGATIVE
Protein, ur: NEGATIVE mg/dL
pH: 6.5 (ref 5.0–8.0)

## 2010-12-27 LAB — BASIC METABOLIC PANEL
BUN: 4 mg/dL — ABNORMAL LOW (ref 6–23)
Creatinine, Ser: 0.66 mg/dL (ref 0.4–1.2)
GFR calc non Af Amer: >60 mL/min (ref >60)
Glucose, Bld: 125 mg/dL — ABNORMAL HIGH (ref 70–99)
Potassium: 3 mEq/L — ABNORMAL LOW (ref 3.5–5.1)

## 2010-12-27 LAB — CBC
MCH: 30.9 pg (ref 26.0–34.0)
MCHC: 34.6 g/dL (ref 30.0–36.0)
Platelets: 144 10*3/uL — ABNORMAL LOW (ref 150–400)
RDW: 14.7 % (ref 11.5–15.5)

## 2010-12-27 LAB — PROTIME-INR
INR: 1.57 — ABNORMAL HIGH (ref 0.00–1.49)
Prothrombin Time: 19 seconds — ABNORMAL HIGH (ref 11.6–15.2)

## 2010-12-27 LAB — URINE MICROSCOPIC-ADD ON

## 2010-12-28 LAB — CBC
HCT: 27.3 % — ABNORMAL LOW (ref 36.0–46.0)
MCH: 30.6 pg (ref 26.0–34.0)
MCV: 88.9 fL (ref 78.0–100.0)
Platelets: 156 10*3/uL (ref 150–400)
RDW: 14.7 % (ref 11.5–15.5)

## 2010-12-28 LAB — BASIC METABOLIC PANEL
CO2: 30 mEq/L (ref 19–32)
Calcium: 8.3 mg/dL — ABNORMAL LOW (ref 8.4–10.5)
Chloride: 100 mEq/L (ref 96–112)
Creatinine, Ser: 0.67 mg/dL (ref 0.4–1.2)
Glucose, Bld: 109 mg/dL — ABNORMAL HIGH (ref 70–99)

## 2011-01-01 NOTE — Progress Notes (Signed)
Summary: refill pain med  Phone Note Refill Request Message from:  Fax from Pharmacy on December 24, 2010 9:56 AM  Refills Requested: Medication #1:  HYDROCODONE-ACETAMINOPHEN 5-325 MG TABS 1 by mouth q6hrs as needed pain   Last Refilled: 11/22/[facility] [address]   Method Requested: Fax to Local Pharmacy Initial call taken by: Duard Brady LPN,  December 24, 2010 9:56 AM  Follow-up for Phone Call        #100 RF 3 Follow-up by: Gordy Savers  MD,  December 24, 2010 12:02 PM    Prescriptions: HYDROCODONE-ACETAMINOPHEN 5-325 MG TABS (HYDROCODONE-ACETAMINOPHEN) 1 by mouth q6hrs as needed pain  #100 x 3   Entered by:   Duard Brady LPN   Authorized by:   Gordy Savers  MD   Signed by:   Duard Brady LPN on 56/21/3086   Method used:   Historical   RxID:   [id number]  [facility]  KIK

## 2011-01-08 NOTE — Op Note (Signed)
NAME:  [name], [name]               ACCOUNT NO.:  [id number]  MEDICAL RECORD NO.:  [id number]           PATIENT TYPE:  I  LOCATION:  0005                         FACILITY:  [facility]  PHYSICIAN:  Ollen Gross, M.D.    DATE OF BIRTH:  [birth year]  DATE OF PROCEDURE: DATE OF DISCHARGE:                              OPERATIVE REPORT   PREOPERATIVE DIAGNOSIS:  Osteoarthritis, left knee.  POSTOPERATIVE DIAGNOSIS:  Osteoarthritis, left knee.  PROCEDURE:  Left total knee arthroplasty.  SURGEON:  Ollen Gross, M.D.  ASSISTANT:  Avel Peace, PA-C  ANESTHESIA:  General.  ESTIMATED BLOOD LOSS:  Minimal.  DRAINS:  Hemovac times one.  TOURNIQUET TIME:  30 minutes at 300 mmHg.  COMPLICATIONS:  None.  CONDITION:  Stable.  BRIEF CLINICAL NOTE:  [name] is a 77 year old female with advanced end- stage arthritis of left knee with progressively worsening pain and dysfunction.  She has failed nonoperative management and presents for a total knee arthroplasty.  PROCEDURE IN DETAIL:  After successful administration of general anesthetic, tourniquet was placed on the left thigh, and the left lower extremity prepped and draped in the usual sterile fashion.  Extremities wrapped in Esmarch, knee flexed, tourniquet inflated at 300 mmHg. Midline incision made with a 10 blade through subcutaneous tissue at the level of the extensor mechanism.  Fresh blade was used to make a medial parapatellar arthrotomy.  Soft tissue on the proximal medial tibia subperiosteally elevated to the joint line with the knife into the semimembranosus bursa with a Cobb elevator.  Soft tissue laterally was elevated with attention being paid to avoid the patellar tendon on tibial tubercle.  Patella was everted, knee flexed 90 degrees, and ACL and PCL were removed.  Drill was used to create a starting hole in the distal femur, and canal was thoroughly irrigated.  The 5-degree left valgus alignment guide was placed, and a  distal femoral cutting block pinned to remove 11 mm off the distal femur.  Distal femoral resection was made with an oscillating saw.  The tibia was subluxed forward, and the menisci removed.  Extramedullary tibial alignment guide was placed referencing proximally at the medial aspect of tibial tubercle and distally along the second metatarsal axis and tibial crest.  Blocks pinned to remove 2 mm off the more deficient medial side.  Tibial resection was made with an oscillating saw.  A size 2.5 was the most appropriate tibial component, and her proximal tibia was prepared with the modular drill and keel punch for the size 2.5.  Femoral sizing guide was placed, and size 4 was most appropriate. Rotation was marked off the epicondylar axis.  Size 3 cutting block was placed, and the anterior-posterior and chamfer cuts were made.  The intercondylar block was placed and that cut was made.  Trial size 3 posterior stabilized femur was placed.  A 10 mm posterior stabilized rotating platform insert trial was placed.  There was a tiny bit of varus-valgus play, so changed it to 12.5, which allowed for full extension with excellent varus-valgus and anterior-posterior balance throughout full range of motion.  Patella was everted, thickness measured to be  20 mm.  Freehand resection was taken at 12 mm, 35 template was placed, lug holes drilled, trial patella was placed, and it tracks normally.  Osteophytes were removed off the posterior femur with the trial in place.  All trials were removed, and the cut bone surfaces prepared with pulsatile lavage.  Cement was mixed, and once ready for implantation, the size 2.5 mobile bearing tibial tray, size 3 posterior stabilized femur, and 35 patella were cemented into place, and patella was held with a clamp.  Trial 12.5-mm insert was placed, knee held in full extension, and all extruded cement removed.  When the cement was fully hardened, the permit 12.5 mm  posterior stabilized rotating platform insert was placed in the tibial tray.  Wound was copiously irrigated with saline solution, and the arthrotomy closed over Hemovac drain with interrupted #1 PDS.  Flexion against gravity was 135 degrees, and patella tracked normally.  Tourniquet was released with total time 30 minutes.  Subcutaneous was closed with interrupted 2-0 Vicryl, subcuticular with running 4-0 Monocryl.  Catheter for the Marcaine pain pump was placed, and pump initiated.  Incision was cleaned and dried, and Steri-Strips and a bulky sterile dressing applied.  She was then placed into a knee immobilizer, awakened, and transported to recovery in stable condition.     Ollen Gross, M.D.     FA/MEDQ  D:  12/25/2010  T:  12/25/2010  Job:  [id number]  Electronically Signed by Ollen Gross M.D. on 01/08/2011 02:52:14 PM

## 2011-01-08 NOTE — H&P (Signed)
NAME:  [name], [name]               ACCOUNT NO.:  [id number]  MEDICAL RECORD NO.:  [id number]           PATIENT TYPE:  I  LOCATION:  1621                         FACILITY:  [facility]  PHYSICIAN:  Ollen Gross, M.D.    DATE OF BIRTH:  05-Jun-1932  DATE OF ADMISSION:  12/25/2010 DATE OF DISCHARGE:  12/28/2010                             HISTORY & PHYSICAL   DATE OF OFFICE VISIT/HISTORY AND PHYSICAL:  December 24, 2010  DATE ADMISSION:  December 25, 2010  CHIEF COMPLAINT:  Left knee pain.  HISTORY OF PRESENT ILLNESS:  The patient is a 77 year old female well- known to Dr. Homero Fellers Mairim Bade.  She has known end-stage arthritis of the left knee, has been progressively getting worse.  She has a valgus deformity.  She has had the right knee done and is doing very well with that and now presents to have the other side done.  ALLERGIES:  TETANUS, PENICILLIN, and GENERIC FLEXERIL.  CURRENT MEDICATIONS:  Hydrocodone, Fosamax, hydrocodone, baby aspirin, metoprolol, Lomotil, simvastatin, Zyrtec, hydroxyzine, prednisone, hydroxychloroquine, methotrexate, Lasix and potassium.  PAST MEDICAL HISTORY:  Degenerative disk disease.  Past history of pneumonia, hypertension, cardiac murmur, mild reflux, hypercholesterolemia, history of urinary tract infections, postmenopausal.  Past history of hives and pruritus.  Childhood illnesses of measles and mumps.  PAST SURGICAL HISTORY:  Tonsillectomy, 2 breast biopsies, several D and C, cesarean section, open heart surgery with a single bypass, cervical disk disease, right total knee.  FAMILY HISTORY:  Father deceased at age 12 with history of stroke. Mother passed away at 22 with congestive heart failure.  SOCIAL HISTORY:  Widowed.  She is the owner of [employer]. Nonsmoker.  Two children.  Lives alone.  She does want to look into rehab facility, would like to look into [facility].  She does have a living will and healthcare power of attorney.  REVIEW  OF SYSTEMS:  GENERAL:  No fevers, chills or night sweats.  NEURO: No seizure, syncope, or paralysis.  RESPIRATORY:  No shortness breath, productive cough, or hemoptysis.  CARDIOVASCULAR:  No chest pain, no orthopnea.  GI:  No nausea, vomiting, diarrhea, or constipation.  GU: No dysuria, hematuria or discharge.  MUSCULOSKELETAL:  Left knee.  PHYSICAL EXAMINATION:  VITAL SIGNS:  Pulse 72, respirations 14, blood pressure 142/68. General:  A 77 year old female, well nourished, well developed, no acute distress.  She is alert, oriented, cooperative, very pleasant at time of exam. HEENT:  Normocephalic, atraumatic.  Pupils round and reactive.  EOMs intact. NECK:  Supple. CHEST:  Clear. HEART:  Regular rate and rhythm with a faint cardiac murmur noted. ABDOMEN:  Soft, nontender.  Bowel sounds present, slightly round. RECTAL:  Not done, not pertinent to present illness. BREASTS:  Not done, not pertinent to present illness. GENITALIA:  Not done, not pertinent to present illness. EXTREMITIES:  Left knee, positive effusion for the valgus malalignment deformity, marked crepitus noted on passive range of motion.  IMPRESSION:  Osteoarthritis, left knee.  PLAN:  The patient will be admitted to [facility], undergo a left total knee replacement arthroplasty.  She has been seen preoperatively by Dr.  Eleonore Chiquito, felt to be stable for surgery. She also has been seen by Dr. Dietrich Pates and felt to be stable for surgery.     Alexzandrew L. Julien Girt, P.A.C.   ______________________________ Ollen Gross, M.D.    ALP/MEDQ  D:  01/06/2011  T:  01/06/2011  Job:  [id number]  cc:   Gordy Savers, MD [address] [place] [address]  Pricilla Riffle, MD, [facility] [address]  [address] [place] [address]  Electronically Signed by Patrica Duel P.A.C. on 01/08/2011 11:22:24 AM Electronically Signed by Ollen Gross M.D. on 01/08/2011 02:52:17 PM

## 2011-01-08 NOTE — Discharge Summary (Signed)
NAME:  [name], [name]               ACCOUNT NO.:  [id number]  MEDICAL RECORD NO.:  [id number]           PATIENT TYPE:  I  LOCATION:  1621                         FACILITY:  [facility]  PHYSICIAN:  Ollen Gross, M.D.    DATE OF BIRTH:  1932-11-20  DATE OF ADMISSION:  12/25/2010 DATE OF DISCHARGE:                        DISCHARGE SUMMARY - REFERRING   TENTATIVE DATE OF DISCHARGE:  December 28, 2010.  ADMITTING DIAGNOSES: 1. Osteoarthritis, left knee. 2. Past history of pneumonia. 3. Hypertensive. 4. Cardiac murmur. 5. Reflux. 6. Hypercholesterolemia. 7. History of urinary tract infections. 8. Postmenopausal. 9. Past history of hives and pruritus. 10.Childhood illnesses of measles and mumps.  DISCHARGE DIAGNOSES: 1. Osteoarthritis left knee, status post left total knee replacement     arthroplasty. 2. Postop hypokalemia. 3. Postop acute blood loss anemia. 4. Past history of pneumonia. 5. Hypertensive. 6. Cardiac murmur. 7. Reflux. 8. Hypercholesterolemia. 9. History of urinary tract infections. 10.Postmenopausal. 11.Past history of hives and pruritus. 12.Childhood illnesses of measles and mumps.  PROCEDURE:  On December 25, 2010, left total knee.  SURGEON:  Ollen Gross, M.D.  ASSISTANT:  Alexzandrew L. Perkins, P.A.C.  ANESTHESIA:  General.  TOURNIQUET TIME:  30 minutes.  CONSULTS:  None.  BRIEF HISTORY:  [name] is a 77 year old female with end-stage arthritis of left knee and progressive worsening pain and dysfunction failed nonoperative management now presents for total knee arthroplasty.  LABORATORY DATA:  Preop CBC showed a hemoglobin 12.2, hematocrit 35.8, white cell count 6, and platelets 226,000.  PT/INR 14.6 and 1.12 with a PTT of 32.  Chem panel on admission all within normal limits.  Preop UA; many leukocytes, many squamous, 7-10 white cells, 0-2 red cells, and few bacteria.  Blood group type B negative.  Nasal swabs were positive staph aureus but  negative for MRSA.  Postop hemoglobin on postop day #1 was 10.1.  Last H&H on postop day #2 was 9.8 and 28.3.  There is a pending CBC on the date of discharge.  Serial pro times followed per Coumadin protocol.  Last PT/INR on postop day #2 was 19.0 and 1.57.  Serial BMETs were followed.  Electrolytes on postop day #1 were normal.  On postop day #2, the potassium dropped from 3.5 to 3.0.  She was placed on K-Dur, and there is a follow-up BMET on the date of discharge.  HOSPITAL COURSE:  The patient admitted to [facility], taken to OR, underwent above-stated procedure without complication.  The patient tolerated procedure well, later transferred to the recovery room orthopedic floor, started on p.o. and IV analgesic pain control following the surgery.  She actually did very well on the evening of surgery.  In the morning, the A1c looked good.  She wanted to look into [facility], so we got social work involved.  She did have a preop UTI, and we would recheck her urine and that was pending at time of dictation.  Encouraged pulmonary toilet.  She had good urine output. Hemoglobin looked good.  Started getting up out of bed and actually did very well on day #1 following the surgery.  She was walking  about 40 feet.  By day #2, she was doing well, good pain control.  Dressing was change.  The incision looked good.  Potassium was down a little bit so we put her on some potassium supplements, and we had rechecked that on postop day.  She looked good.  We are having social work check to see if [name] would have a bed on Saturday.  If so and if she was meeting her goals and doing well, she will be transferred out tomorrow on December 28, 2010.  DISCHARGE PLAN: 1. Tentative transfer tomorrow on December 28, 2010. 2. Discharge diagnoses, please see above.  DISCHARGE MEDICATIONS:  Current medications at the time of transfer include: 1. Coumadin protocol.  Please titrate the Coumadin level  for target     INR between 2.0 and 3.0.  She is to be on Coumadin from 3 weeks     from the date of surgery. 2. Colace 100 mg p.o. b.i.d. 3. Bactroban b.i.d. for 3 more days nasally. 4. Simvastatin 40 mg at bedtime. 5. Toprol XL 50 mg p.o. daily. 6. Baby aspirin 81 mg daily. 7. Hydrochlorothiazide 25 mg daily every morning. 8. Tylenol 325 one or two every 4-6 hours as needed for mild pain,     temperature, or headache. 9. Robaxin 500 mg p.o. q.6-8h. p.r.n. spasm. 10.Vicodin 5 mg 1-2 every 4-6 hours as needed for mild to moderate     pain. 11.Claritin 10 mg p.o. at bedtime p.r.n. 12.Hydroxyzine 25 mg p.o. q.6h. p.r.n. itching. 13.Lasix 20 mg 1-2 p.o. daily p.r.n. swelling.  DIET:  Heart-healthy diet.  ACTIVITY:  She is weightbearing as tolerated, total knee protocol.  PT and OT for gait training ambulation, ADLs.  FOLLOWUP:  She needs to follow up with Dr. Lequita Halt in 2 weeks, please contact the office for an appointment at [phone] to arrange appointment and followup care.  DISPOSITION:  Pending at this time, waiting on bed offer from [facility].  CONDITION ON DISCHARGE:  Pending but stable at time of dictation.     Alexzandrew L. Julien Girt, P.A.C.   ______________________________ Ollen Gross, M.D.    ALP/MEDQ  D:  12/27/2010  T:  12/27/2010  Job:  [id number]  cc:   Gordy Savers, MD [address] [place] [address]  Pricilla Riffle, MD, [facility] [address]  [address] [place] [address]  Ollen Gross, M.D. Fax: [phone]  Electronically Signed by Patrica Duel P.A.C. on 12/30/2010 08:21:47 AM Electronically Signed by Ollen Gross M.D. on 01/08/2011 02:52:12 PM

## 2011-02-04 LAB — URINALYSIS, ROUTINE W REFLEX MICROSCOPIC
Bilirubin Urine: NEGATIVE
Glucose, UA: NEGATIVE mg/dL
Protein, ur: NEGATIVE mg/dL
Specific Gravity, Urine: 1.025 (ref 1.005–1.030)
Urobilinogen, UA: 0.2 mg/dL (ref 0.0–1.0)

## 2011-02-04 LAB — COMPREHENSIVE METABOLIC PANEL
ALT: 21 U/L (ref 0–35)
AST: 26 U/L (ref 0–37)
Albumin: 3.8 g/dL (ref 3.5–5.2)
Alkaline Phosphatase: 79 U/L (ref 39–117)
Chloride: 102 mEq/L (ref 96–112)
GFR calc Af Amer: >60 mL/min (ref >60)
Potassium: 4.4 mEq/L (ref 3.5–5.1)
Sodium: 140 mEq/L (ref 135–145)
Total Bilirubin: 0.7 mg/dL (ref 0.3–1.2)
Total Protein: 7 g/dL (ref 6.0–8.3)

## 2011-02-04 LAB — CBC
MCV: 91.9 fL (ref 78.0–100.0)
Platelets: 211 10*3/uL (ref 150–400)
RBC: 3.89 MIL/uL (ref 3.87–5.11)
RDW: 15.9 % — ABNORMAL HIGH (ref 11.5–15.5)
WBC: 5.8 10*3/uL (ref 4.0–10.5)

## 2011-02-04 LAB — PROTIME-INR: INR: 1.01 (ref 0.00–1.49)

## 2011-02-04 LAB — URINE MICROSCOPIC-ADD ON

## 2011-02-04 LAB — SURGICAL PCR SCREEN: MRSA, PCR: NEGATIVE

## 2011-02-09 LAB — BASIC METABOLIC PANEL
Calcium: 9.1 mg/dL (ref 8.4–10.5)
GFR calc Af Amer: >60 mL/min (ref >60)
GFR calc non Af Amer: >60 mL/min (ref >60)
Glucose, Bld: 97 mg/dL (ref 70–99)
Potassium: 4.3 mEq/L (ref 3.5–5.1)
Sodium: 141 mEq/L (ref 135–145)

## 2011-02-09 LAB — PROTIME-INR: INR: 1.06 (ref 0.00–1.49)

## 2011-02-09 LAB — CBC
Hemoglobin: 12.1 g/dL (ref 12.0–15.0)
MCHC: 35.5 g/dL (ref 30.0–36.0)
WBC: 6.5 10*3/uL (ref 4.0–10.5)

## 2011-02-09 LAB — DIFFERENTIAL
Basophils Absolute: 0 10*3/uL (ref 0.0–0.1)
Basophils Relative: 1 % (ref 0–1)
Lymphocytes Relative: 20 % (ref 12–46)
Monocytes Relative: 10 % (ref 3–12)
Neutro Abs: 4.4 10*3/uL (ref 1.7–7.7)
Neutrophils Relative %: 67 % (ref 43–77)

## 2011-02-09 LAB — SURGICAL PCR SCREEN
MRSA, PCR: NEGATIVE
Staphylococcus aureus: NEGATIVE

## 2011-02-09 LAB — APTT: aPTT: 29 seconds (ref 24–37)

## 2011-02-10 ENCOUNTER — Other Ambulatory Visit: Payer: Self-pay | Admitting: Cardiothoracic Surgery

## 2011-02-10 DIAGNOSIS — Z8679 Personal history of other diseases of the circulatory system: Secondary | ICD-10-CM

## 2011-02-20 ENCOUNTER — Other Ambulatory Visit: Payer: Self-pay | Admitting: Internal Medicine

## 2011-03-05 ENCOUNTER — Encounter: Payer: Self-pay | Admitting: Internal Medicine

## 2011-03-05 LAB — CBC
HCT: 29.8 % — ABNORMAL LOW (ref 36.0–46.0)
Hemoglobin: 10.2 g/dL — ABNORMAL LOW (ref 12.0–15.0)
Hemoglobin: 12 g/dL (ref 12.0–15.0)
MCHC: 34.3 g/dL (ref 30.0–36.0)
MCHC: 34.4 g/dL (ref 30.0–36.0)
MCV: 85.8 fL (ref 78.0–100.0)
Platelets: 193 10*3/uL (ref 150–400)
Platelets: 239 10*3/uL (ref 150–400)
RDW: 16.2 % — ABNORMAL HIGH (ref 11.5–15.5)
RDW: 16.2 % — ABNORMAL HIGH (ref 11.5–15.5)
RDW: 16.5 % — ABNORMAL HIGH (ref 11.5–15.5)
WBC: 8 10*3/uL (ref 4.0–10.5)
WBC: 8.4 10*3/uL (ref 4.0–10.5)

## 2011-03-05 LAB — BASIC METABOLIC PANEL
BUN: 7 mg/dL (ref 6–23)
BUN: 9 mg/dL (ref 6–23)
CO2: 28 mEq/L (ref 19–32)
CO2: 29 mEq/L (ref 19–32)
CO2: 29 mEq/L (ref 19–32)
Calcium: 7.9 mg/dL — ABNORMAL LOW (ref 8.4–10.5)
Chloride: 98 mEq/L (ref 96–112)
Chloride: 99 mEq/L (ref 96–112)
Creatinine, Ser: 0.5 mg/dL (ref 0.4–1.2)
Creatinine, Ser: 0.52 mg/dL (ref 0.4–1.2)
Creatinine, Ser: 0.62 mg/dL (ref 0.4–1.2)
Creatinine, Ser: 0.83 mg/dL (ref 0.4–1.2)
GFR calc Af Amer: >60 mL/min (ref >60)
GFR calc non Af Amer: >60 mL/min (ref >60)
Glucose, Bld: 138 mg/dL — ABNORMAL HIGH (ref 70–99)

## 2011-03-05 LAB — PROTIME-INR
INR: 1.3 (ref 0.00–1.49)
INR: 1.7 — ABNORMAL HIGH (ref 0.00–1.49)
Prothrombin Time: 20.8 seconds — ABNORMAL HIGH (ref 11.6–15.2)
Prothrombin Time: 20.9 seconds — ABNORMAL HIGH (ref 11.6–15.2)

## 2011-03-05 LAB — URINE MICROSCOPIC-ADD ON

## 2011-03-05 LAB — COMPREHENSIVE METABOLIC PANEL
ALT: 30 U/L (ref 0–35)
Albumin: 3.1 g/dL — ABNORMAL LOW (ref 3.5–5.2)
Alkaline Phosphatase: 72 U/L (ref 39–117)
Potassium: 4.4 mEq/L (ref 3.5–5.1)
Sodium: 141 mEq/L (ref 135–145)
Total Protein: 6.1 g/dL (ref 6.0–8.3)

## 2011-03-05 LAB — URINALYSIS, ROUTINE W REFLEX MICROSCOPIC
Glucose, UA: NEGATIVE mg/dL
Hgb urine dipstick: NEGATIVE
Specific Gravity, Urine: 1.024 (ref 1.005–1.030)
Urobilinogen, UA: 0.2 mg/dL (ref 0.0–1.0)
pH: 6.5 (ref 5.0–8.0)

## 2011-03-05 LAB — ABO/RH: ABO/RH(D): B NEG

## 2011-03-06 ENCOUNTER — Ambulatory Visit: Payer: Self-pay | Admitting: Internal Medicine

## 2011-03-12 ENCOUNTER — Ambulatory Visit
Admission: RE | Admit: 2011-03-12 | Discharge: 2011-03-12 | Disposition: A | Discharge status: Home / Self Care | Payer: Medicare Other | Source: Ambulatory Visit | Attending: Cardiothoracic Surgery | Admitting: Cardiothoracic Surgery

## 2011-03-12 ENCOUNTER — Other Ambulatory Visit: Payer: Self-pay | Admitting: Internal Medicine

## 2011-03-12 ENCOUNTER — Ambulatory Visit (INDEPENDENT_AMBULATORY_CARE_PROVIDER_SITE_OTHER): Payer: Medicare Other | Admitting: Cardiothoracic Surgery

## 2011-03-12 DIAGNOSIS — I2510 Atherosclerotic heart disease of native coronary artery without angina pectoris: Secondary | ICD-10-CM

## 2011-03-12 DIAGNOSIS — I712 Thoracic aortic aneurysm, without rupture: Secondary | ICD-10-CM

## 2011-03-12 DIAGNOSIS — Z8679 Personal history of other diseases of the circulatory system: Secondary | ICD-10-CM

## 2011-03-12 MED ORDER — IOHEXOL 300 MG/ML  SOLN
100.0000 mL | Freq: Once | INTRAMUSCULAR | Status: AC | PRN
  Administered 2011-03-12: 100 mL via INTRAVENOUS

## 2011-03-15 NOTE — Assessment & Plan Note (Signed)
OFFICE VISIT  Cove, Harlene P DOB:  08-03-1932                                        March 14, 2011 CHART #:  [id number]  CURRENT PROBLEMS: 1. 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 2. Hypertension. 3. Rheumatoid arthritis.  HISTORY OF PRESENT ILLNESS:  Ms. Pousson is a very nice 77 year old woman who returns for almost 2-year followup after undergoing replacement of a large ascending aneurysm under circulatory arrest and combined CABG x1. She has had no cardiac or vascular problems since her last visit a year and a half ago.  She has had progressive problems with arthritis and her joints and has undergone another joint replacement operation, receiving physical therapy for a weak left hip.  She denies back or chest pain, angina, or symptoms of CHF.  She has maintained sinus rhythm.  She remains on her chronic medications including hydrochlorothiazide, Fosamax, Claritin, aspirin 81 mg, metoprolol 25 b.i.d., and simvastatin 40 mg at bedtime.  PHYSICAL EXAMINATION:  VITAL SIGNS:  Blood pressure 140/77, pulse 90 and regular, respirations 20, saturation 95% on room air. GENERAL:  She is a very nice woman who is very upbeat and pleasant in no distress, but uses a walker due to her arthritis. LUNGS:  Breath sounds are clear and equal. CARDIAC:  Rhythm is regular without murmur or rub. EXTREMITIES:  She has minimal ankle edema.  CT scan shows her aortic repair to be intact.  Her distal arch remains stable just under 4 cm and descending thoracic aorta is also not enlarged.  IMPRESSION:  The patient has done well now 2 years after the ascending aortic replacement with the current graft and combined coronary artery bypass grafting x1.  Her blood pressure remains under good control.  I do not see the need to continue regular CT scans for followup of  her thoracic aorta and she will return to the care of Dr. Dietrich Pates and Dr. Amador Cunas, and return here as needed.  Kerin Perna, M.D. Electronically Signed  PV/MEDQ  D:  03/14/2011  T:  03/15/2011  Job:  [id number]

## 2011-03-17 ENCOUNTER — Other Ambulatory Visit: Payer: Self-pay

## 2011-03-17 NOTE — Telephone Encounter (Signed)
Refill request from [facility] for lomotil - last rx'd 05/2009 Please advise

## 2011-03-18 MED ORDER — DIPHENOXYLATE-ATROPINE 2.5-0.025 MG PO TABS: 1.0000 | ORAL_TABLET | Freq: Four times a day (QID) | ORAL | Status: DC | PRN

## 2011-03-18 NOTE — Telephone Encounter (Signed)
F[facility]ed [facility] to [facility] 

## 2011-03-18 NOTE — Telephone Encounter (Signed)
20

## 2011-03-28 ENCOUNTER — Encounter: Payer: Self-pay | Admitting: *Deleted

## 2011-03-31 ENCOUNTER — Encounter: Payer: Self-pay | Admitting: Internal Medicine

## 2011-03-31 ENCOUNTER — Ambulatory Visit (INDEPENDENT_AMBULATORY_CARE_PROVIDER_SITE_OTHER): Payer: Medicare Other | Admitting: Internal Medicine

## 2011-03-31 VITALS — BP 130/64 | HR 71 | Ht 63.0 in | Wt 170.8 lb

## 2011-03-31 DIAGNOSIS — E785 Hyperlipidemia, unspecified: Secondary | ICD-10-CM

## 2011-03-31 DIAGNOSIS — I2510 Atherosclerotic heart disease of native coronary artery without angina pectoris: Secondary | ICD-10-CM

## 2011-03-31 DIAGNOSIS — I10 Essential (primary) hypertension: Secondary | ICD-10-CM

## 2011-03-31 NOTE — Progress Notes (Signed)
HPI [name] is a 77 year old with a history of CAD and aortic aneurysm (s/p CABG and repair in Aug 2009).  Also a history of DJD, hypertension, RA, dislipidemia.  I last saw her in last fall.  Since seen she has had surgery on knee Since seen she has had multiple medical issues especially with DJD and unsteadiness.  NOne though are cardiac related. She denies chest pain.  Breathing is OK. CT scan done (ordered by P VanTrigt).  She was d/c 'd from f/u in his office. Allergies  Allergen Reactions  . Amoxicillin     REACTION: unspecified  . Cyclobenzaprine Hcl     REACTION: rash  . Penicillins   . Tetanus Toxoid     Current Outpatient Prescriptions  Medication Sig Dispense Refill  . alendronate (FOSAMAX) 70 MG tablet Take 70 mg by mouth every 7 (seven) days. Take with a full glass of water on an empty stomach.       Marland Kitchen aspirin 81 MG tablet Take 81 mg by mouth daily.        . cetirizine (ZYRTEC) 10 MG tablet Take 10 mg by mouth daily as needed.        . folic acid (FOLVITE) 1 MG tablet Take 1 mg by mouth 2 (two) times daily.        . furosemide (LASIX) 40 MG tablet Take 40 mg by mouth 2 (two) times a week.       . hydrochlorothiazide 25 MG tablet TAKE 1 TABLET BY MOUTH EVERY MORNING  90 tablet  1  . HYDROcodone-acetaminophen (NORCO) 5-325 MG per tablet Take 1 tablet by mouth every 6 (six) hours as needed.        . hydroxychloroquine (PLAQUENIL) 200 MG tablet Take 200 mg by mouth 2 (two) times daily.        . hydrOXYzine (ATARAX) 25 MG tablet TAKE 1 TABLET BY MOUTH EVERY 4 HOURS AS NEEDED  30 tablet  0  . methotrexate 2.5 MG tablet Take by mouth. 6 tablets once weekly       . metoprolol (TOPROL-XL) 50 MG 24 hr tablet Take 50 mg by mouth daily.        . Multiple Vitamin (MULTIVITAMIN) tablet Take 1 tablet by mouth daily.        . potassium chloride SA (K-DUR,KLOR-CON) 20 MEQ tablet Take 20 mEq by mouth 2 (two) times a week.       . simvastatin (ZOCOR) 40 MG tablet Take 40 mg by mouth daily.         . diphenoxylate-atropine (LOMOTIL) 2.5-0.025 MG per tablet Take 1 tablet by mouth 4 (four) times daily as needed for diarrhea/loose stools.  20 tablet  0  . DISCONTD: ergocalciferol (VITAMIN D2) 50000 UNITS capsule Take 50,000 Units by mouth once a week.          Past Medical History  Diagnosis Date  . CORONARY ATHEROSCLEROSIS, ARTERY BYPASS GRAFT 08/23/2008  . DEPRESSION 05/21/2007  . FIBROCYSTIC BREAST DISEASE, HX OF 05/21/2007  . HYPERLIPIDEMIA-MIXED 11/26/2008  . HYPERTENSION 05/11/2008  . Irritable bowel syndrome 05/21/2007  . KNEE PAIN 09/16/2007  . LEG EDEMA, BILATERAL 05/21/2009  . OSTEOARTHRITIS 01/20/2008  . OSTEOPENIA 05/21/2007  . URTICARIA, CHRONIC 05/21/2007  . DJD (degenerative joint disease)     history of   . History of aortic aneurysm     resection and grafting of a 5.2-cm ascending aortic arch aneurysm August 2009  . Dyslipidemia   . Vitamin D  deficiency   . Urticaria   . Rheumatoid arthritis   . Neuropathy     left radial  neuropathy with wrist drop  . CAD (coronary artery disease)     s/p CABG -  x 1 with LIMA to LAD August 2009 /   . Diverticulosis   . Irritable bowel syndrome   . GERD (gastroesophageal reflux disease)     Past Surgical History  Procedure Date  . Appendectomy   . Cesarean section   . Hernia repair   . Coronary artery bypass graft August 2009    CABG x 1 with LIMA to LAD /  . Ascending aortic aneurysm repair August 2009     resection and grafting of a 5.2-cm ascending aortic arch aneurysm    Family History  Problem Relation Age of Onset  . Heart disease    . Coronary artery disease      History   Social History  . Marital Status: Widowed    Spouse Name: N/A    Number of Children: N/A  . Years of Education: N/A   Occupational History  . Not on file.   Social History Main Topics  . Smoking status: Never Smoker   . Smokeless tobacco: Not on file  . Alcohol Use: No  . Drug Use: Not on file  . Sexually Active: Not on file     Other Topics Concern  . Not on file   Social History Narrative  . No narrative on file    Review of Systems:  All systems reviewed.  They are negative to the above problem except as previously stated.  Vital Signs: BP 130/64  Pulse 71  Ht 5\' 3"  (1.6 m)  Wt 170 lb 12.8 oz (77.474 kg)  BMI 30.26 kg/m2  Physical Exam Patient in NAD   HEENT:  Normocephalic, atraumatic. EOMI, PERRLA.  Neck: JVP is normal. No thyromegaly. No bruits.  Lungs: clear to auscultation. No rales no wheezes.  Heart: Regular rate and rhythm. Normal S1, S2. No S3.   No significant murmurs. PMI not displaced.  Abdomen:  Supple, nontender. Normal bowel sounds. No masses. No hepatomegaly.  Extremities:   Good distal pulses throughout. No lower extremity edema.  Musculoskeletal :moving all extremities.  Neuro:   alert and oriented x3.  CN II-XII grossly intact.  EKG:  NSR.  71 bpm.   Assessment and Plan:

## 2011-03-31 NOTE — Patient Instructions (Signed)
Your physician wants you to follow-up in:  Jan. 2013 with Dr.Ross You will receive a reminder letter in the mail two months in advance. If you don't receive a letter, please call our office to schedule the follow-up appointment.

## 2011-04-01 NOTE — Assessment & Plan Note (Signed)
Need fasting lipids

## 2011-04-01 NOTE — Assessment & Plan Note (Signed)
Controlled. No changes. 

## 2011-04-01 NOTE — Assessment & Plan Note (Signed)
S/p CABG in 2009 (LIMA to LAD).  Doing well.  NO angina.

## 2011-04-08 NOTE — Consult Note (Signed)
NEW PATIENT CONSULTATION   Foley, [name]  DOB:  [birth year]                                        June 30, 2008  CHART #:  [id number]   PHYSICIAN REQUESTING IN CONSULTATION:  Gordy Savers, MD.   PRIMARY CARDIOLOGIST:  Pricilla Riffle, MD, [facility].   CONSULTANT:  Kerin Perna, MD.   REASON FOR CONSULTATION:  Ascending thoracic aortic aneurysm by recent  CT scan.   CHIEF COMPLAINT:  I have a murmur.   HISTORY OF PRESENT ILLNESS:  I was asked to evaluate this 77 year old  white female patient for evaluation of surgical intervention of recently  diagnosed 5.2 cm fusiform aneurysm of the ascending thoracic aorta.  The  patient was undergoing preoperative evaluation for a right knee  replacement when a chest x-ray was found to be abnormal.  A subsequent  CT scan of the chest was performed because of concern of a right midlung  nodule as well as a widened mediastinum.  CT scan demonstrates some  scarring in her lung with some chronic interstitial lung disease but no  primary mass or nodule.  Her ascending aorta is dilated with a diameter  of 5.2 cm.  The arch and descending thoracic aorta are of normal  caliber.  There is no evidence of dissection or intramural hematoma.  The aortic valve has some calcification.  Otherwise, there is no  mediastinal or hilar adenopathy.  Because of the abnormal CT scan, she  was initially seen by Dr. Darrick Penna who did an ultrasound of her abdomen,  which showed no abdominal aneurysm.  She was subsequently referred to  this office for cardiothoracic surgical evaluation.  She has had no  chest pain or back pain.  There is no clear family history of thoracic  aneurysmal disease, although her mother did have a leaky heart valve.   PAST MEDICAL HISTORY:  1. Hypertension.  2. GERD.  3. Diverticulosis.  4. Irritable bowel syndrome.  5. History of hives.  6. Allergy to penicillin.   HOME MEDICATIONS:  Hydrochlorothiazide 25 mg  a day, Fosamax 70 mg a  week, multivitamin, Ambien 10 mg p.r.n., furosemide 40 mg p.o. p.r.n.,  and Xyzal 5 mg daily.   ALLERGIES:  Tetanus shot and penicillin (rash).   SOCIAL HISTORY:  The patient is widowed.  She has an adult son.  She is  independent living.  She does not smoke or use alcohol.   FAMILY HISTORY:  Mother had a leaky heart valve.  She died of heart  failure.  Her father died of a stroke at age 17.   REVIEW OF SYSTEMS:  Constitutional:  Review is negative for fever,  weight loss, chills, or night sweats.  ENT:  Review is negative for  dental problems or difficulty swallowing.  Thoracic:  Review is negative  for history of thoracic trauma or symptoms of URI.  Her CT scan shows  some old interstitial scarring of the right lung, which is minimal.  No  discrete mass.  GI:  Review is positive for diverticular disease and  GERD.  No hepatitis, jaundice, or blood per rectum.  Urologic:  Review  is negative for kidney stone or recent UTI.  Endocrine:  Review is  negative for diabetes or thyroid disease.  Vascular:  Review is negative  for DVT, claudication, or  TIA.  Hematologic:  Review is negative for  bleeding disorder or blood transfusion.  Neurologic:  Review is negative  for stroke or seizure.   PHYSICAL EXAMINATION:  VITAL SIGNS:  The patient is 5 feet 4 inches and  weighs 182 pounds, blood pressure 140/70, pulse 84 and regular,  respirations 18 and unlabored, and saturation on room air 98%.  GENERAL APPEARANCE:  This is a pleasant 77 year old female who is  anxious but in no distress.  HEENT:  Normocephalic.  NECK:  Without JVD, mass, or bruit.  LYMPHATICS:  There is no palpable adenopathy of the supraclavicular area  or cervical area.  CHEST:  Her thorax reveals a mild pectus deformity.  Breath sounds are  clear bilaterally.  CARDIAC:  A grade 2/6 systolic ejection murmur at the right upper  sternal border consistent with aortic sclerosis.  There is no  diastolic  murmur.  There is no S3 gallop or ectopy.  ABDOMEN:  Obese, soft, and nontender without pulsatile mass.  EXTREMITIES:  No clubbing, cyanosis, or edema.  Peripheral pulses are  intact in all extremities.  She has some superficial spider varicosities  of both legs below the knee.  NEUROLOGIC:  Alert and oriented without focal motor deficit.   LABORATORY DATA:  I reviewed her CT scan and she has a fusiform  ascending aneurysm measuring 5.2 cm in diameter.  The arch and  descending thoracic aorta are within normal caliber.  An ultrasound of  the abdominal aorta shows no disease.   IMPRESSION AND PLAN:  The patient has significant dilatation of the  ascending aorta and would benefit from surgical replacement.  Prior to  surgery, she will need a 2-D echo to evaluate the aortic valve as well  as left and right heart catheterization.  After these studies are  completed, she will be reevaluated for surgery.  For now, the knee  replacement is on hold.   Kerin Perna, M.D.  Electronically Signed   PV/MEDQ  D:  06/30/2008  T:  07/01/2008  Job:  [id number]   cc:   Pricilla Riffle, MD, [facility]  Gordy Savers, MD

## 2011-04-08 NOTE — Assessment & Plan Note (Signed)
OFFICE VISIT   Parilla, Laverda P  DOB:  [birth year]                                        September 14, 2009  CHART #:  [id number]   CURRENT PROBLEMS:  1. Status post resection and grafting of an ascending fusiform      aneurysm 5.5 cm with coronary artery bypass graft x1 in August      2009.  2. Hypertension.  3. Rheumatoid arthritis.   PRESENT ILLNESS:  Ms. Sleeper is a very nice 77 year old female with a  history of hypertension who on chest x-ray prior to orthopedic surgeries  noted to have an incidental finding of an aneurysm of her ascending  thoracic aorta.  This was treated with a Dacron graft reconstruction as  well as a left IMA graft to her LAD which had a 70% stenosis.  She is  now a little over a year postop and returns with a CT angiogram for  followup of her thoracic aortic disease.  She has no symptoms of chest  pain.  She has lost weight following her heart surgery followed by her  right total knee replacement.  She has no symptoms of CHF.  At the time  of surgery, aortic valve was without significant disease and was  preserved.   PHYSICAL EXAMINATION:  Blood pressure 145/70, pulse 74 and regular,  respirations 18, saturation 98% on room air.  She is alert and pleasant.  Her breath sounds are clear and equal.  The sternal incision is well  healed.  Cardiac rhythm is regular.  There is no murmur or rub or  gallop.  She has no significant peripheral edema.   IMAGING:  Review of the CT angiogram on the first visualization of the  thoracic aorta postop shows the ascending aorta diameter to be 3 cm and  there is no evidence of pseudoaneurysm.  Her distal arch is measured at  4 cm.  Everything looks fine.   IMPRESSION AND PLAN:  She will continue to take her aspirin 81 mg, her  statin, and her metoprolol for blood pressure control.  I plan on seeing  her back in 18 months with a followup CT angiogram of the thoracic  aorta.   Kerin Perna,  M.D.  Electronically Signed   PV/MEDQ  D:  09/14/2009  T:  09/14/2009  Job:  [id number]   cc:   Pricilla Riffle, MD, [facility]  Gordy Savers, MD

## 2011-04-08 NOTE — Assessment & Plan Note (Signed)
[facility]                            CARDIOLOGY OFFICE NOTE   NAME:[name], ARNETRA TERRIS                      MRN:          [id number]  DATE:06/30/2008                            DOB:          [birth year]    IDENTIFICATION:  Ms. [name] is a 77 year old woman who was referred by  Kathlee Nations Trigt for evaluation  pre aortic surgery.   HISTORY OF PRESENT ILLNESS:  The patient has no known history of  coronary artery disease.  She actually was being evaluated for right  knee replacement.  As part of the evaluation, she had an x-ray that was  abnormal and went on to have a chest CT.  X-ray showed nodule and  tortuous aorta.  CT showed 5.3 x 4.7 ascending aortic aneurysm.  The  descending aorta was normal.  Note:  There is mild nodular air space  disease, but no comment was made on significant lung nodule, and pectus  deformity noted.   The patient was referred to Dr. Donata Clay.   On talking to the patient, she is not that active.  Activity is limited  by arthritis of the knee.  She denies chest pain, gets short of breath  occasionally, but there is no PND, and has occasional episodes of  dizziness (rare), remote syncope when she was young.  It happened when  she was in a choir.   ALLERGIES:  Penicillin and tetanus.   Current medications include hydrochlorothiazide 25 mg, Fosamax 70 every  week, Xyzal 5 mg daily, multivitamins, hydroxyzine p.r.n., hydrocodone  and acetaminophen p.r.n., and Lasix 40 mg p.r.n.   PAST MEDICAL HISTORY:  1. Hypertension.  2. Allergies followed by Dr. Austin Callas.  3. DJD.   SOCIAL HISTORY:  The patient is widowed, does not smoke, and does not  drink.   FAMILY HISTORY:  Mother died at the age of 55 had heart disease.  Father  died of a stroke at age 23.   Past surgical history includes tonsillectomy, two breast biopsies, and  D&Cs.   REVIEW OF SYSTEMS:  All systems reviewed, negative for the above problem  except as noted  above.   PHYSICAL EXAM:  The patient is in no acute distress at rest.  Blood  pressure 116/70.  Pulse is 90 and regular.  Weight 179.  HEENT:  Normocephalic atraumatic.  EOMI. PERRL.  Mucous membranes are moist.  Neck:  JVP is normal.  No bruits.  No thyromegaly.  Lungs are clear  without rales or wheezes.  Cardiac:  Regular rate and rhythm.  S1 and  S2.  No S3 or S4.  Grade 1-2/6 systolic murmur heard best at the left  sternal border radiating up to the right upper sternal border.  No  diastolic murmurs audible.  PMI not displaced.  Abdominal exam, supple  and nontender.  Midline incision.  No masses.  No hepatomegaly.  Extremities:  Good distal pulses (brisk).  No lower extremity edema.   A 12-lead EKG shows normal sinus rhythm 90 beats per minute.  Left axis  deviation.   IMPRESSION:  The patient is a  77 year old who in preop evaluation for  her knee was found to have a large ascending aortic aneurysm.  She is  being evaluated for repair.  On examination today, she has no evidence  of heart failure.  There is no evidence of aortic insufficiency.   We will go ahead and schedule the patient for an echocardiogram to  evaluate her valve.  She does have a systolic murmur that most likely  represents some aortic sclerosis, but it would be important to define  any gradient.  Also provide evaluation of her LV function in the  proximal aortic root.   The patient will also be set up for a cardiac catheterization to rule  out any coronary artery disease.  It can be treated while she is  undergoing surgery.  Risks and benefits explained.  The patient  understands and agrees to proceed.  We will get labs today.  Continue on  medicines, this will be set up for Tuesday of next week.  Follow up with  Dr. Donata Clay.     Pricilla Riffle, MD, [facility]  Electronically Signed    PVR/MedQ  DD: 06/30/2008  DT: 07/01/2008  Job #: [id number]

## 2011-04-08 NOTE — Assessment & Plan Note (Signed)
[facility]                            [place] OFFICE NOTE   NAME:[name], [name]                      MRN:          [id number]  DATE:05/18/2007                            DOB:          1932-06-10    A 77 year old female seen today for an annual exam.  She has a history  of chronic urticaria, gastroesophageal reflux disease.  She has done  quite well.  She is followed by gynecology.  She has a history of  depression, IBS, osteoporosis, mild hypertension and DJD.  She had  hernia repair and appendectomy in 1979.  Has had a C-section and is  gravida 2, para 2, abortus 0.  Has also had a breast biopsy in the past.   MEDICAL REGIMEN:  1. HCTZ 25.  2. Fosamax 70.  3. Xyzal.  4. Multivitamins.  5. Calcium.   REVIEW OF SYSTEM EXAM:  Negative.   She had an air contrast barium enema in 2005 that was negative.  She  does receive annual gynecologic care.   FAMILY HISTORY:  Father died at 70 of complications of hypertension and  cerebrovascular disease.  Mother died at 12 of heart failure.  Maternal  grandmother had stomach cancer.  No siblings.   SOCIAL HISTORY:  She is widowed.  Her husband, Lu Duffel, died about 15  months ago.   EXAM:  Revealed a healthy appearing elderly female in no acute distress,  mildly overweight.  Blood pressure is 130/72.  FUNDI, EAR, NOSE AND THROAT:  Clear.  NECK:  No bruits.  CHEST:  Clear.  CARDIOVASCULAR:  Normal heart sounds, no murmurs, no ectopics.  BREASTS:  Negative.  ABDOMEN:  Soft and nontender, no organomegaly.  A surgical scar is  noted.  PELVIC AND RECTAL:  Deferred.  EXTREMITIES:  Intact peripheral pulses.  NEURO EXAMINATION:  Negative, intact to monofilament testing.   IMPRESSION:  1. Chronic urticaria.  2. Mild hypertension.  3. Degenerative joint disease.  4. Osteoporosis.  5. Irritable bowel syndrome.   DISPOSITION:  Medical regimen unchanged.  Will reassess in 4 months and  a flu vaccine  will be administered, prescriptions dispensed.     Gordy Savers, MD  Electronically Signed    PFK/MedQ  DD: 05/18/2007  DT: 05/18/2007  Job #: [id number]

## 2011-04-08 NOTE — Assessment & Plan Note (Signed)
OFFICE VISIT   Foley, [name]  DOB:  [birth year]                                        September 08, 2008  CHART #:  [id number]   CURRENT PROBLEMS:  1. Status post resection and grafting of ascending aortic-arch      aneurysm with a 28 mm hemiarch Dacron graft July 20, 2008.  2. Coronary artery bypass graft x1 left IMA to LAD July 20, 2008.  3. Hypertension.  4. Severe degenerative arthritis of right knee, pending total knee      replacement by Dr. Lequita Halt.   HISTORY OF PRESENT ILLNESS:  The patient is a 77 year old female, who  returns for final postop office visit.  She is now ready to enter the  rehab program and is at home driving and walking daily.  She has no  significant chest pain or symptoms of CHF and surgical incisions in her  chest and right groin are healing appropriately.  She has had some  hoarseness, sore throat, and a mildly productive cough.  There has been  no fever.   CURRENT MEDICATIONS:  Metoprolol 25 mg b.i.d., hydrochlorothiazide 25 mg  once a day, Xyzal, Ambien, vitamins, and Zocor 40 mg nightly.   PHYSICAL EXAMINATION:  VITAL SIGNS:  Blood pressure 130/80, pulse 100,  respirations 18, and saturation 96%.  GENERAL:  She is alert and pleasant.  LUNGS:  Breath sounds are clear.  CARDIAC:  Rhythm is regular.  There is no murmur.  SKIN:  The sternal and right infraclavicular incisions are healing well.  The right groin incision is healing well.  EXTREMITIES:  She has good pulses in all extremities.  NEUROLOGIC:  Intact.   IMPRESSION AND PLAN:  The patient is doing well, now 6 weeks postop.  She is ready to enter cardiac rehab and progress with her activity  level.  I will see her back in 6 months with a chest x-ray and then plan  on annual CT angiogram of the thoracic aorta.  I have provided her with  a prescription for Z-Pak for her upper respiratory infection and we will  see her back in 6 months.   Kerin Perna,  M.D.  Electronically Signed   PV/MEDQ  D:  09/08/2008  T:  09/09/2008  Job:  [id number]

## 2011-04-08 NOTE — Assessment & Plan Note (Signed)
[facility]                                 ON-CALL NOTE   NAME:[name], [name]                        MRN:          [id number]  DATE:05/22/2007                            DOB:          10/01/1932    Patient of Dr. Amador Cunas.  Called at 9:21 a.m., [phone].   She fell asleep in her armchair for over 2 hours and her right hand is  numb.  Her son evaluated her and took her home with him.  She is still  having problems with her hand.  It did not sound like a stroke, but she  was worried about her blood pressure, too, so she came in to the office  for evaluation.     Karie Schwalbe, MD  Electronically Signed    RIL/MedQ  DD: 05/22/2007  DT: 05/22/2007  Job #: [id number]   cc:   Gordy Savers, MD

## 2011-04-08 NOTE — Discharge Summary (Signed)
[name], Foley               ACCOUNT NO.:  [id number]   MEDICAL RECORD NO.:  [id number]          Foley TYPE:  INP   LOCATION:  1604                         FACILITY:  [facility]   PHYSICIAN:  Ollen Gross, M.D.    DATE OF BIRTH:  1932/07/08   DATE OF ADMISSION:  03/19/2009  DATE OF DISCHARGE:  03/22/2009                               DISCHARGE SUMMARY   ADMISSION DIAGNOSES:  1. Osteoarthritis right knee.  2. Past history of pneumonia.  3. Hypertension.  4. Cardiac murmur.  5. Mild reflux.  6. Hypercholesterolemia.  7. Past history of urinary tract infections.  8. Postmenopausal.  9. History of hives and pruritus, nothing for Colleen past 3-4 years.  10.Childhood illnesses of measles and mumps.   DISCHARGE DIAGNOSES:  1. Osteoarthritis right knee status post right total knee replacement      arthroplasty.  2. Mild postop acute blood loss anemia did not require transfusion.  3. Postop hyponatremia improved.  4. Postop hypokalemia improved.  5. Past history of pneumonia.  6. Hypertension.  7. Cardiac murmur.  8. Mild reflux.  9. Hypercholesterolemia.  10.Past history of urinary tract infections.  11.Postmenopausal.  12.History of hives and pruritus, nothing for Colleen past 3-4 years.  13.Childhood illnesses of measles and mumps.   PROCEDURE:  March 19, 2009, right total knee arthroplasty.  Surgeon Dr.  Homero Fellers Aluisio.  Assistant, Avel Peace PA-C.  Surgery under general  anesthesia.  Tourniquet time 32 minutes.   CONSULTS:  None.   BRIEF HISTORY:  [name] is a 77 year old female with end-stage valgus  arthritis of Colleen right knee with progressive worsening pain and  dysfunction, failed nonoperative management, now presents for total knee  arthroplasty.   LABORATORY DATA:  Preop CBC showed hemoglobin of 12, hematocrit 35.2,  white cell count 8.0, platelets 239.  PT/INR 13.7 and 1.0 with PTT of  29.  Chem panel on admission minimally elevated CO2 of 33.  Remaining  Chem  panel within normal limits.  Preop UA only showed 3-6 white cells,  0-2 red cells, few bacteria, otherwise negative.  Blood group type B  negative.  Serial CBCs were followed.  Hemoglobin dropped down to 10.2  then 9.7.  Started coming back up, and it was back to 10.3 and 29.9  hematocrit.  Serial protimes followed per Coumadin protocol.  Last  PT/INR 20.9 and 1.7.  Serial B mets were followed.  Sodium did drop from  141 down to 133 back up to 136, potassium dropped 4.4, dropped down as  low at 2.8 back up to 3.6.  Remaining B-mets within normal limits.   Chest x-ray March 16, 2009.  Two-view chest improving left pleural  effusion, left basilar aeration healing fracture of Colleen right fourth rib  accounts for density in Colleen right perihilar region and Colleen frontal exam,  no acute findings.   HOSPITAL COURSE:  [name] was admitted to [facility],  taken to OR, underwent above-stated procedure without complication.  Colleen  Foley tolerated seizure well, later transferred to Colleen recovery room  on orthopedic floor.  Started on PCA  and p.o. analgesic pain control  following surgery.  Actually doing pretty well on Colleen evening of  surgery, was able to sit up and dangle.  By Colleen morning of day #1, she  was using Colleen PCA, but weaned over to p.o. medications.  Seen in rounds.  Started getting up out of bed, had excellent urinary output.  A little  low sodium which felt to be delusional component, hypokalemia was also  noted.  She was placed on K-Dur supplementation.  She wanted look into a  skilled risk rehab facility so we got social work involved immediately.  She was started back on her home medicine cardiac medications.  She had  a past history of pneumonia, so we encouraged her pulmonary toilet.  She  was on chronic prednisone.  She was placed on a prednisone taper after  stress doses given to her in Colleen operating room.  By day #2, she is  already walking about 50 feet, dressing  change, incision looked good.  Hemoglobin was down a little bit to 9.7, so we placed her on iron  supplementation, but she is asymptomatic with this.  Colleen sodium started  improving, already right back up to 136 and Colleen potassium improved with  Colleen K-Dur, continued on Colleen prednisone taper.  She worked on physical  therapy.  It was noted there was a bed possibly by Colleen following day.  On Colleen evening of day #2, she was seen on rounds Colleen morning of day #3,  had not had a bowel movement yet, so we were going to try a suppository.  Her INR was becoming therapeutic.  She was already up to 1.7.  Hemoglobin back up a little bit to 10.3.  Sodium and potassium already  corrected themselves.  She was doing well, no complaints.  Progressing  with therapy, felt to be an excellent candidate for rehab.  Tentative  bed later today, and she was transferred out.   DISCHARGE/PLAN:  1. Foley transferred to [facility] on March 22, 2009.  2. Discharge diagnoses please see above.  3. Discharge medications.   CURRENT MEDICATIONS AT TIME OF TRANSFER:  1. Prednisone 10 mg twice a day on March 22, 2009.  Continue current      dosage prednisone 10 mg twice a day.  2. Coumadin protocol.  Please titrate Coumadin level for target INR      between 2.0 and 3.0 mL.  Needs to be on Coumadin for 3 weeks from      Colleen date of surgery March 19, 2009.  3. Colace 100 mg p.o. b.i.d.  4. Hydrochlorothiazide 25 mg daily.  5. metoprolol/Toprol XL 25 mg p.o. b.i.d.  6. Zocor 40 mg p.o. q.p.m.  7. Nu-Iron 150 mg p.o. daily for 3 weeks.  May discontinue Colleen Nu-Iron      after 3 weeks from Colleen date of surgery.  8. Robaxin 500 mg p.o. q.6-8 h p.r.n. spasm.  9. Percocet 5 mg 1 or 2 every 4 hours as needed for pain.  10.Tylenol 325 one or two every 4-6 hours a for mild pain, temper      headache.  11.Lomotil one tablet p.o. daily p.r.n.  12.Zyrtec 10 mg p.o. daily p.r.n.  13.Hydroxyzine 25 mg p.o. daily p.r.n.  14.Laxative  of choice.  15.Enema of choice.  16.MiraLax 17 grams in 8 ounces of water or juice p.o. daily p.r.n.      constipation  17.Please note her Plaquenil is currently on hold while she is  on      Coumadin.  She may resume Colleen Plaquenil 200 mg twice a day after      she has completed Colleen 3 weeks of Coumadin, do not start until      Coumadin is discontinued.   DISCHARGE INSTRUCTIONS:  1. Diet.  Low cholesterol, heart healthy diet.  2. Activity.  She is weightbearing as tolerated to right lower      extremity.  Continue PT/OT for gait training, ambulation and ADLs.      For total knee protocol, she needs to be up out of bed minimum      b.i.d.  She needs to wear her TED hose, both legs during Colleen day,      but she may have them off at night.  She may start showering once      she is transferred.  However, do not submerge Colleen incision under      water, but daily showering is okay.  Daily dressing change to Colleen      incision.  Continued total knee protocol.   FOLLOW UP:  She needs to follow up with Dr. Lequita Halt in Colleen office on  either Tuesday,  May 11 or Thursday, May 13, please contact Colleen office  at [phone] to help arrange appointment and transfer and follow-up with  this Foley at Colleen [employer] office of [facility]   DISPOSITION:  [facility]   CONDITION ON DISCHARGE:  Improving.      Alexzandrew L. Perkins, P.A.C.      Ollen Gross, M.D.  Electronically Signed    ALP/MEDQ  D:  03/22/2009  T:  03/22/2009  Job:  [id number]   cc:   Ollen Gross, M.D.  Fax: [phone]   Gordy Savers, MD  [address] [place]  [place] [address]   Kerin Perna, M.D.  [address]  [place]  [place] [address]   Pricilla Riffle, MD, [facility]  [address]  [address]  [place]  [place] [address]

## 2011-04-08 NOTE — Discharge Summary (Signed)
[name], [name]               ACCOUNT NO.:  [id number]   MEDICAL RECORD NO.:  [id number]          PATIENT TYPE:  INP   LOCATION:  2017                         FACILITY:  [facility]   PHYSICIAN:  Kerin Perna, M.D.  DATE OF BIRTH:  1932/06/05   DATE OF ADMISSION:  07/20/2008  DATE OF DISCHARGE:  07/27/2008                               DISCHARGE SUMMARY   HISTORY:  The patient is a 77 year old female referred to Dr. Kathlee Nations  Trigt for surgical intervention due to a recently diagnosed 5.2 cm  fusiform aneurysm of the ascending thoracic aorta.  The patient was  undergoing a preoperative evaluation for a right knee replacement when a  chest x-ray found an abnormal widened mediastinum.  Subsequent to this,  a CT scan was obtained and this revealed a concern of a right mid lung  nodule as well as the widened mediastinum.  CT scan demonstrated some  scarring of her lung with chronic interstitial lung disease, but no  primary mass or nodule.  Her ascending aorta was dilated.  It had a  diameter of 5.2 cm.  The arch and descending thoracic aorta are of  normal caliber.  There was no evidence of dissection or intramural  hematoma.  The aortic valve revealed some calcification, otherwise no  mediastinal or hilar adenopathy.  Because of the abnormal CT scan she  was initially referred to Dr. Fabienne Bruns who did an ultrasound of  her abdomen which showed no abdominal aneurysm findings.  She as  referred to Dr. Donata Clay for a thoracic surgical opinion.  She had no  chest or back pain.  There was no clear family history of thoracic  aneurysm disease.  Her mother did have a leaky heart valve.  An  echocardiogram was obtained and this showed normal left ventricular  function with some diastolic dysfunction and normal left ventricular  size.  The aortic valve was trileaflet with mild aortic insufficiency  and a transient valvular gradient of only 5 to 6 mmHg.  There was no  significant mitral  valve pathology and pulmonary pressures by  echocardiogram were normal.  She subsequently underwent a left heart  catheterization by Dr. Riley Kill which revealed a right dominant  circulation with normal coronary arteries with the exception of a 70%  stenosis of the proximal LAD.  Left ventricular end diastolic pressure  was 14 mmHg.  Aortogram confirmed the fusiform aneurysm extending from  the sinotubular junction to the arch.  By CT angiogram the distal arch  and proximal descending thoracic aorta appeared to be of normal caliber.  The patient was admitted this hospitalization for repair of the aortic  aneurysm as well as a coronary artery bypass to the left anterior  descending.   PAST MEDICAL HISTORY:  Her past medical history includes the following:  1. Hypertension.  2. Gastroesophageal reflux disease.  3. Diverticulosis.  4. Irritable bowel syndrome.  5. History of hives.  6. History of ALLERGY TO PENICILLIN.   MEDICATIONS PRIOR TO ADMISSION:  1. Fosamax 70 mg tablets weekly.  2. Hydrochlorothiazide 25 mg daily.  3.  Tums two tabs twice daily.  4. Furosemide 40 mg daily p.r.n.  5. Hydrocodone one tablet every 6 hours p.r.n.  6. Xyzal 5 mg one tablet at bedtime.  7. Hydroxyzine 25 mg p.r.n.  8. Diphenoxylate one tablet p.r.n. q.4h.  9. Metoprolol 25 mg daily.  10.Claritin one tablet daily p.r.n.   FAMILY/SOCIAL HISTORY, REVIEW OF SYSTEMS, PHYSICAL EXAMINATION:  Please  see the history and physical done at the time of admission.   HOSPITAL COURSE:  The patient was admitted electively and on July 20, 2008 taken to the operating room where she underwent the following  procedures:  1. Resection and grafting of the ascending aortic and arch aneurysm      using a 28 mm Dacron Hemashield Graft (Hemiarch reconstruction)      with profound hypothermic circulatory arrest.  2. Coronary artery bypass grafting x1 utilizing the left internal      mammary artery to the left anterior  descending coronary artery.      The patient tolerated the procedures well and was taken to the      surgical intensive care unit in stable condition.   POSTOPERATIVE HOSPITAL COURSE:  The patient has progressed nicely.  She  has remained in normal sinus rhythm.  She has remained hemodynamically  stable and all routine lines, monitors and drainage devices, as well as  inotropic support was discontinued in a standard fashion.  She did have  an acute blood loss anemia.  Her hemoglobin and hematocrit have  stabilized.  Most recent values dated July 24, 2008 show hemoglobin  9.7 and hematocrit 28.4.  Electrolytes - BUN and creatinine are within  normal limits although she has required some potassium supplementation.  She has required a moderate diuresis and will require some further as an  outpatient.  Incisions are healing well without signs of infection. A 2-  dimensional echocardiogram was planned for today's date, July 26, 2008 and this result will be evaluated prior to discharge.  Additionally, the patient is known to have significant orthopedic  impairment due to severe knee arthritis with history of preoperative  falls.  She does not have full time assistance at home and therefore it  is the opinion of physical therapy and her medical doctor's that she  would benefit from at least a short term stay in a skilled nursing  facility to continue her rehabilitation.  She appears to be too high  functioning for inpatient rehabilitation at this time.  Currently the  patient's status is felt to be tentatively stable for transfer to the  nursing facility in the next day or so.   DISCHARGE INSTRUCTIONS:  The patient will receive written instructions  regarding medications, activity, diet, wound care and followup.   FOLLOWUP:  Followup will include Dr. Donata Clay on Friday, August 18, 2008 at 11 A.M. with a chest x-ray.  Additionally, she is instructed to  see Dr. Tenny Craw in two  weeks.   DISCHARGE MEDICATIONS:  Medications on discharge include the following:  1. Currently Fosamax is on hold but can be restarted as an outpatient      in the next several weeks.  2. Tums two tablets twice daily.  3. Xyzal 5 mg nightly.  4. Hydroxyzine 25 mg p.r.n.  5. Claritin one tablet p.r.n.  6. Aspirin 81 mg daily.  7. Metoprolol 25 mg twice daily.  8. Lasix 40 mg daily until August 02, 2008.  9. Vicodin 5/325 mg one to two q.6h.  as needed for pain.  10.K-Dur 20 mEq daily until August 02, 2008.   FINAL DIAGNOSIS:  Ascending aortic and arch aneurysm now status post  resection and grafting as described above.   OTHER DIAGNOSES:  1. Acute blood loss anemia.  2. Coronary artery disease, one vessel.  3. Hypertension.  4. Gastroesophageal reflux disease.  5. Diverticulosis.  6. Irritable bowel syndrome.  7. History of hives.  8. ALLERGY TO PENICILLIN.  9. Severe arthritis.   CONDITION ON DISCHARGE:  Stable and improving.   ACTIVITY:  The patient is encouraged to continue physical therapy at the  nursing facility with assistance with ambulation with a walker.  She is  instructed on sternal precautions with no lifting more than 10 pounds.  She may shower with soap and water.   DIET:  She is recommended a cardiac prudent diet.   WOUND CARE:  The patient may clean her incisions gently with soap and  water.  She is  not to drive until seen by the surgeon.   She is encouraged to continue use of her incentive spirometer at the  nursing facility.      Rowe Clack, P.A.-C.      Kerin Perna, M.D.  Electronically Signed    WEG/MEDQ  D:  07/26/2008  T:  07/26/2008  Job:  [id number]   cc:   Gordy Savers, MD  Pricilla Riffle, MD, [facility]

## 2011-04-08 NOTE — Op Note (Signed)
[name], [name]               ACCOUNT NO.:  [id number]   MEDICAL RECORD NO.:  [id number]          PATIENT TYPE:  INP   LOCATION:  2304                         FACILITY:  [facility]   PHYSICIAN:  Kerin Perna, M.D.  DATE OF BIRTH:  1932-11-24   DATE OF PROCEDURE:  07/20/2008  DATE OF DISCHARGE:                               OPERATIVE REPORT   OPERATION:  1. Resection and grafting of ascending aortic and arch aneurysm using      a 28-mm Dacron Hemashield graft (hemi arch reconstruction) with      profound hypothermic circulatory arrest.  2. Coronary bypass grafting x1 utilizing left internal mammary artery      to left anterior descending for a single-vessel coronary artery      disease.   SURGEON:  Kerin Perna, MD   ASSISTANT:  Coral Ceo, PA-C   ANESTHESIA:  General.   PREOPERATIVE DIAGNOSIS:  Ascending and arch aneurysm, and single-vessel  coronary artery disease.   POSTOPERATIVE DIAGNOSIS:  Ascending and arch aneurysm, and single-vessel  coronary artery disease.   INDICATIONS:  The patient is a 77 year old hypertensive female who was  found on a preoperative screening x-ray to have a large mediastinum  prior to knee surgery.  Subsequent CT angiogram demonstrated a proximal  5.5 cm in diameter fusiform ascending aneurysm extending into the aortic  arch.  Subsequent cardiac catheterization demonstrated a 70% tubular  stenosis of the LAD.  LV systolic function was well preserved and there  is no significant mitral valve disease.  Based on the patient's anatomy,  it was felt she would be a candidate for reconstruction of her ascending  aorta and arch and single-vessel bypass surgery.  Of note, the  preoperative echo showed no significant aortic stenosis or aortic  insufficiency of the valve proper.   Prior to surgery, I examined the patient in the office and I reviewed  the results of the CT scan and cath and echo with the patient and  family.  I discussed the  indications and expected benefits of coronary  bypass surgery and resection grafting, that were ascending aorta and  arch aneurysm.  I discussed the alternatives to surgery as well as the  major issues of surgery including location of the surgical incisions,  use of general anesthesia, and cardiopulmonary bypass, the use of  hypothermic circulatory arrest, and the expected postoperative recovery.  I reviewed with the patient, the risks to her from this operation  including risks of stroke, MI, bleeding, blood transfusion requirement,  infection, and death.  After reviewing these issues, she demonstrated  her understanding and agreed to proceed with the operation as planned  under what I felt was an informed consent.   OPERATIVE FINDINGS:  The intraoperative measurement of the ascending  aorta at the maximal diameter was almost 6 cm.  The TEE showed no  significant AI or AS.  The patient received 3 units of packed cells to  keep the hemoglobin about 7 g.  At the termination of bypass, her  platelet count was low (90,000) and she was given platelet transfusion  pack x2.  The echo after the hemi-arch repair showed no significant AI.   PROCEDURE:  The patient was brought to the operative room and placed  supine on the operating table where general anesthesia was induced.  A  transesophageal 2-D echo probe was placed by the anesthesiologist.  This  confirmed the preoperative diagnosis.  A sternal incision was made and  the left internal mammary artery was harvested as a pedicle graft from  its origin at the subclavian vessels.  It was a good vessel with  excellent flow.  A left femoral A-line was placed for monitoring.  After  the sternotomy, an incision was made one fingerbreadth below the right  clavicle to expose and cannulate the right axillary artery.  However,  the vessel was quite deep underneath the clavicle and when became  accessible more laterally, it was too small to use.  For  that reason,  this incision was dried up and packed, and a small right groin incision  was made.  The femoral artery was then exposed and loops were placed  around the common femoral, the profundus femoral, and the superficial  femoral arteries.  Heparin was then administered and a 20-French femoral  cannula was placed into the common femoral artery and secured with a  tourniquet.   The sternum was retracted and pericardium was opened and suspended.  The  pericardial fluid was not bloody.  There was a large aneurysm in the mid  ascending aorta.  Pursestrings were placed in the right atrium and the  patient was cannulated.  The patient was then placed on bypass after the  ACT was documented as being therapeutic following heparin  administration.  After going on bypass, the patient remained stable.  An  LV vent was placed via the right superior pulmonary vein.  Cardioplegia  catheters were placed in the ascending aorta and right atrium into the  coronary sinus for antegrade and retrograde cold blood cardioplegia.  The LAD was dissected and identified, and the mammary artery was  prepared for the distal anastomoses.  A small pursestring was placed in  the superior vena cava for placement of a catheter for retrograde  cerebral perfusion and the catheter was placed into the SVC just past  the innominate vein and secured with tourniquet.  The patient was then  cooled to 16 degrees.  The aortic cross-clamp was applied below the  innominate vessel and the area of the aorta to be resected later.  Cardioplegic arrest was then obtained using antegrade and retrograde  cold blood cardioplegia and septal temperature dropped less than 10  degrees.   First the distal coronary anastomosis to the LAD was performed.  The LAD  was opened and was found to be a 1.5-mm vessel and had a proximal 75%  stenosis.  The left internal mammary artery pedicle was brought through  an opening and the left lateral  pericardium was brought down onto the  LAD and sewn end-to-side with a running 8-0 Prolene.  There was  excellent flow through the anastomosis after briefly releasing pedicle  bulldog on the mammary artery.  The pedicle was secured at the  epicardium and cardioplegia was redosed.   At this point, the cord temperature had dropped down to 16 degrees.  The  patient was given high-dose steroids (2 g) and pentobarbital and the  head was packed in ice.  Circulatory arrest was initiated after the  volume from the patient was drained in the pump.  The cross-clamp was removed.  The aorta was transected at the  sinotubular junction.  It was then retracted and the aortic valve was  inspected.  There were 3 leaflets with minimal if any pathology.  There  was no ectasia or dilatation of the annulus or the root.  It was felt  that a supracoronary graft would be indicated in this patient.  It was  sized to a 28-mm diameter Dacron graft.  Rest of the ascending aorta was  then dissected from the mediastinum and transected at the level of the  innominate artery, beveled to remove the dilated part of the arch  leaving the head vessels intact on the superior aspect of the aorta.  During circulatory arrest, hypothermic retrograde cerebral perfusion  through the SVC catheter was continued without interruption and the  proximal halo was occluded with a vessel loop to keep the cold  retrograde perfusion going retrograde.  The graft was then selected and  it was beveled according to the size and confirmation of the remaining  arch.  I used a graft to the side arm to allow access for perfusion if  necessary and for de-airing.   The distal hemi-arch anastomose was then performed using a running 3-0  Prolene.  After the inferior aspect of the anastomosis to the  undersurface of the arch was completed with running suture, several  interrupted 4-0 pledgeted Prolenes were placed along the inside of the  suture  line to help secure the suture line and provide adequate  hemostasis.  After the posterior suture line of the undersurface, the  arch was completed.  The anterior suture line was completed using a  running 3-0 Prolene and these were then again reinforced with several  interrupted 4-0 Prolene pledgeted sutures.  After the hemi-arch repair  was completed, the patient was placed in a steep Trendelenburg and  profusion from the pump was reinitiated after a 45-minute period of  circulatory arrest.  The retrograde cerebral perfusion was stopped and  the vena caval tape was loosened.  Air was vented through the arch and  out the side arm of the graft and the graft was then clamped beneath the  anastomosis as flow was achieved.  The suture line was inspected and  found to be hemostatic.   While the patient was being reperfused and rewarmed, cardioplegia was  delivered using the retrograde coronary sinus catheter as well as  handheld cardioplegia cannulas into the coronary ostia.  The valve was  again inspected and found to be competent.  The graft was then cut to  the appropriate length and slightly beveled appropriately with a cross-  clamp still in place to allow body and head perfusion.  Next, the  proximal anastomosis of the aortic graft was performed using running 3-0  Prolene starting with the posterior aspect of the aorta.  After the  posterior running suture line was completed, several interrupted 4-0  pledgeted mattress sutures were placed along the posterior suture line  to reinforce the area and to assure complete hemostasis.  These in no  way impinged on the aortic valve of the coronary ostia.  Finally, the  anterior suture line was completed using the continuation of the running  3-0 Prolene and these again were reinforced with several interrupted 4-0  pledgeted sutures on the outside of the aorta anteriorly.  At this  point, a new vent was placed in the distal arch and the native  aorta to  remove any air  or debris, and the patient was given a dose of retrograde  warm blood cardioplegia as well as the usual de-airing maneuvers.  After  careful de-airing maneuvers had been completed, the cross-clamp was  removed.  The heart was cardioverted back to a regular rhythm.  The  retrograde cardioplegia catheter was removed as was the retrograde  cerebral catheter.  The suture lines were inspected and overall were  very satisfactory with minimal revision required.  One suture was placed  on the undersurface of the arch with a pledget 4-0 Prolene.  The patient  was rewarmed and reperfused.  The distal coronary anastomosis was  checked and found to be hemostatic with good flow.  Temporary pacing  wires were applied.  The lungs re-expanded and the ventilator was  resumed.  When the patient was rewarmed and reperfused, she was weaned  off bypass without difficulty on low-dose dopamine.  Cardiac output and  blood pressure were stable.  The echo showed good LV function without  significant AI.  Protamine was administered without adverse reaction.  The cannula was removed.  The mediastinum was irrigated with warm  antibiotic irrigation.  The femoral arterial cannula was removed and the  aorta was repaired with a running 5-0 Prolene.  Two mediastinal and left  pleural chest tube were placed and brought out through separate  incisions.  The superior pericardial fat was closed over the aorta and  the aortic graft.  The sternum was closed with interrupted steel wire.  The pectoralis fascia was closed with a running #1 Vicryl and  subcutaneous was closed with a running Vicryl.  The right  infraclavicular incision was closed in layers using Vicryl.  The right  groin incision was irrigated and closed in layers using Vicryl.  The  sternal incision was then finally closed with a subcuticular and sterile  dressings were applied.  All needle and sponge counts were reported as  being correct.   The patient remained hemodynamically stable.  Total  cardiopulmonary bypass time was 180 minutes with cross-clamp time of 120  minutes.       Kerin Perna, M.D.  Electronically Signed     PV/MEDQ  D:  07/20/2008  T:  07/21/2008  Job:  [id number]   cc:   Pricilla Riffle, MD, [facility]

## 2011-04-08 NOTE — Assessment & Plan Note (Signed)
OFFICE VISIT   [name], [name]  DOB:  12-03-1931                                        March 20, 2009  CHART #:  [id number]   CURRENT PROBLEMS:  1. Status post reconstruction grafting of the ascending fusiform      aneurysm with a Dacron graft (hemi-arch reconstruction) and      coronary artery bypass graft x1 with left internal mammary artery      to left anterior descending in August 2009.  2. Severe degenerative arthritis of the right knee, pending total knee      replacement by Dr. Lequita Halt.  3. Hypertension.   PRESENT ILLNESS:  The patient returns for a 12-month followup after  reconstructive surgery of ascending aorta for a fusiform aneurysm and  CABG x1.  Her cardiac status has been very stable and followup echo  showed no significant AI.  She has, however, continued to have problems  of the right knee and has had falls and is pending total knee  replacement in the near future.   Her current medications include hydrochlorothiazide, Fosamax, Xyzal,  hydrocodone, aspirin 81 mg, Lopressor 25 mg b.i.d., and Zocor 40 mg  nightly.  She is allergic to penicillin.   PHYSICAL EXAMINATION:  VITAL SIGNS:  Blood pressure 130/70, pulse 80,  respirations 18, and saturation 94% on room air.  GENERAL:  She is alert and pleasant.  LUNGS:  Breath sounds are clear and equal.  CHEST:  The sternum is well healed.  CARDIAC:  Rhythm is regular without murmur.  EXTREMITIES:  She has no significant peripheral edema.  She has  tenderness around the right knee.   PA and lateral chest x-ray today shows a stable mediastinal silhouette,  no pleural effusion or CHF.   PLAN:  The patient will proceed with knee replacement as she is now  medically ready.  She will be evaluated here in 6 months with a CT  angiogram of the thoracic aorta to follow her aortic reconstruction.   [name], M.D.  Electronically Signed   PV/MEDQ  D:  03/20/2009  T:  03/21/2009  Job:   [id number]   cc:   Colleen Riffle, MD, [facility]  Colleen Savers, MD  [name], M.D.

## 2011-04-08 NOTE — Procedures (Signed)
DUPLEX ULTRASOUND OF ABDOMINAL AORTA   INDICATION:  Pulsatile abdomen with known ascending aortic aneurysm.   HISTORY:  Diabetes:  No.  Cardiac:  Murmur.  Hypertension:  Yes.  Smoking:  No.  Connective Tissue Disorder:  Family History:  Previous Surgery:   DUPLEX EXAM:         AP (cm)                   TRANSVERSE (cm)  Proximal             1.81 cm                   1.79 cm  Mid                  1.91 cm                   1.67 cm  Distal               1.67 cm                   1.70 cm  Right Iliac          1.06 cm                   1.04 cm  Left Iliac           0.89 cm                   0.92 cm   PREVIOUS:  Date:  AP:  TRANSVERSE:   IMPRESSION:  No evidence of abdominal aortic aneurysm noted.   ___________________________________________  Janetta Hora Fields, MD   MG/MEDQ  D:  06/28/2008  T:  06/28/2008  Job:  [id number]

## 2011-04-08 NOTE — Assessment & Plan Note (Signed)
OFFICE VISIT   Foley, [name]  DOB:  August 23, 1932                                        August 18, 2008  CHART #:  [id number]   CURRENT PROBLEMS:  1. Status post resection grafting with a 5.2-cm fusiform ascending -      aortic arch aneurysm on July 20, 2008.  2. Coronary bypass grafting x1 with left internal mammary artery to      left anterior descending artery on July 20, 2008.  3. Hypertension.  4. Trileaflet aortic valve with trivial aortic insufficiency.  5. Severe degenerative arthritis of the right knee, pending total knee      replacement by Dr. Lequita Halt.   PRESENT ILLNESS:  The patient is a 77 year old female with severe  arthritis of right knee returns for a first office visit after  undergoing resection grafting of an ascending - aortic arch aneurysm  under hypothermic circulatory arrest with combined CABG to the LAD with  the left IMA graft.  She was discharged home in sinus rhythm and spent 2  weeks at [facility] before returning to live with her  children.  She is walking daily, but not driving.  She is gaining  strength and has lost approximately 10 pounds of her preop weight.  She  remains on her multiple medications which include hydrochlorothiazide,  Xyzal, Fosamax, hydrocodone, metoprolol, aspirin 81 mg, and Ambien  Foley.r.n.  She denies chest pain, shortness of breath, or symptoms of CHF.  The surgical incisions are healing well including the sternal and the  right groin incision.  She has baseline ankle swelling.   PHYSICAL EXAMINATION:  VITAL SIGNS:  Blood pressure 140/80, pulse 96,  respirations 18, saturation 96%.  GENERAL:  She is alert and oriented, appears very strong.  LUNGS:  Breath sounds are clear and equal.  CHEST:  The sternum is stable and well healed.  CARDIAC:  Rhythm is regular without S3 gallop or murmur.  ABDOMEN:  Soft.  NEUROLOGIC:  Intact.  EXTREMITIES:  She has chronic mild ankle edema  bilaterally.   PA and lateral chest x-ray shows stable cardiac silhouette with improved  overall aeration of the lungs and no significant pleural effusion.  No  evidence of mediastinal widening or pulmonary infiltrate.   IMPRESSION AND PLAN:  The patient has done well after resection of the  ascending - arch aneurysm.  She has mild aortic insufficiency and the  native valve was spared.  The patient needs blood pressure control with  a beta-blocker and her hydrochlorothiazide and gradual increase in  activity.  She will start cardiac rehab in the near future.  I told her  she could resume driving and move back to her own home within the next 1-  2 weeks.  I provided her with new prescriptions for her current  medications which are about to run out including pain medication.  I  will plan on seeing her back for a wound check in 3 weeks.  We will plan  on having a CT angiogram of the thoracic aorta in approximately 6  months.   Kerin Perna, M.D.  Electronically Signed   PV/MEDQ  D:  08/18/2008  T:  08/19/2008  Job:  [id number]   cc:   Pricilla Riffle, MD, [facility]  Gordy Savers, MD

## 2011-04-08 NOTE — Assessment & Plan Note (Signed)
OFFICE VISIT   Foley, [name]  DOB:  [birth year]                                       06/28/2008  ZOXWR#:[id number]   The patient is a 77 year old female who was undergoing evaluation of  right knee degenerative arthritis and as part of her workup underwent  screening chest x-ray which showed a nodule.  She is currently being  considered for a total knee replacement.  For followup of the chest  nodule a CT scan of the chest was obtained.  This showed a 5.3 cm  ascending aortic aneurysm.  There were no worrisome pulmonary nodules.  She is sent to me today for further evaluation of her ascending aortic  aneurysm.  She denies any chest or abdominal pain.  She has no family  history of aneurysm.  Her atherosclerotic risk factors include  hypertension and elevated cholesterol.   PAST SURGICAL HISTORY:  She had a C-section and multiple breast  biopsies.   PAST MEDICAL HISTORY:  Past medical history is otherwise fairly  unremarkable.   FAMILY HISTORY:  Is noncontributory.   SOCIAL HISTORY:  She is widowed, has two children.  She is a nonsmoker,  nonconsumer of alcohol.   REVIEW OF SYSTEMS:  CONSTITUTIONAL:  On review of systems she is 5 feet  4 inches, 172 pounds.  She has recently had a cardiac murmur detected.  She has some dyspnea with exertion.  PULMONARY:  She has occasional cough at night time when lying flat.  GI, neurologic, hematologic review of systems are all negative.  RENAL:  She has some urinary frequency.  VASCULAR:  She has multiple spider veins in her legs.  ORTHOPEDIC:  She has multiple joint arthritis pain.  PSYCHIATRIC:  She has some mild anxiety.  HEENT:  She has some slight decrease in hearing.   MEDICATIONS:  1. Hydrochlorothiazide 25 mg once a day.  2. Zilol 5 mg once at night.  3. Fosamax 70 mg once a week on Mondays.  4. Hydroxyzine 25 mg p.r.n.  5. Multivitamin once a day.  6. Tums 2 tablets twice a day.  7. Vicodin  p.r.n. pain.  8. Furosemide 40 mg once a day.  9. Multivitamin   ALLERGIES:  She is allergic to penicillin and tetanus toxoid.   PHYSICAL EXAM:  Vital signs:  Blood pressure 140/78 in the left arm,  pulse is 82 and regular.  HEENT:  Unremarkable.  Neck:  She has 2+  carotid pulses without bruit.  Chest:  Clear to auscultation.  Cardiac:  Regular rate and rhythm with a 3/6 systolic murmur.  Abdomen:  Is soft,  nontender with easily palpable aortic pulsation.  There are no masses.  She has 2+ radial, femoral and dorsalis pedis pulses bilaterally.  She  has no extremity edema.  She does have several spider varicosity type  veins in her thighs bilaterally.   She had an ultrasound of her abdominal aorta today which showed no  evidence of abdominal aortic aneurysm.  Aortic diameter was maximal at  1.9 cm in diameter.  I reviewed her CT scan of the chest today which  showed an ascending aortic aneurysm.  I discussed these findings with  Dr. Kathlee Nations Trigt and he will see the patient in followup regarding  this.  She does have some symptoms of possible aortic valvular disease  with  a new onset murmur, some easy fatigability and night time coughing  with some dyspnea.  She will follow up with me on an as-needed basis.   Janetta Hora. Fields, MD  Electronically Signed   CEF/MEDQ  D:  06/29/2008  T:  06/29/2008  Job:  [id number]   cc:   Ollen Gross, M.D.  Gordy Savers, MD  Kerin Perna, M.D.

## 2011-04-08 NOTE — Assessment & Plan Note (Signed)
[facility]                            CARDIOLOGY OFFICE NOTE   NAME:Foley, Colleen IBSEN                      MRN:          [id number]  DATE:08/24/2008                            DOB:          02-02-1932    IDENTIFICATION:  [name] is a 77 year old woman who I saw back in  August.  She was sent for presurgical evaluation, catheterization prior  to aortic aneurysm repair.  The patient underwent the procedures.  She  was found on cardiac cath to have a focal 50-70% narrowing.  RCA was  free of critical disease.  Circumflex was also free of critical disease.   On July 20, 2008, the patient underwent resection and grafting of the  aortic arch and ascending aorta, also underwent CABG x1(LIMA to LAD).  She was discharged from the hospital after a fairly uneventful  postoperative course on July 27, 2008.  Fortunately, she did not  have a followup check until she saw Dr. Donata Clay.  She has been feeling  okay, actually recovering fairly well.  She denies chest pressure.  No  palpitations.  Her incision is little sore.  He removed a couple of  scabs when he saw her.   CURRENT MEDICINES:  1. Fosamax 70 q. week.  2. Hydrochlorothiazide 25.  3. Xyzal nightly.  4. Metoprolol 25 b.i.d.  5. Aspirin 81.  6. Vitamin D every week (Note: When the patient was at [facility], her      hydrochlorothiazide was decreased to 12.5.  This was increased      about a week ago by Dr. Donata Clay).  7. Metoprolol also was increased to b.i.d.   PHYSICAL EXAMINATION:  GENERAL:  The patient is in no distress.  VITAL SIGNS:  Blood pressure 116/70, pulse is 90 and regular, and weight  179.  NECK:  JVP is normal.  LUNGS:  Clear without rales.  CARDIAC:  Regular rate and rhythm.  S1 and S2.  No S3, no murmurs.  CHEST:  Healing well.  ABDOMEN:  Benign.  No hepatomegaly.  EXTREMITIES:  No significant edema.  Varicosity is noted.   IMPRESSION:  1. Status post aortic repair and  coronary artery bypass graft, doing      very well postoperatively.  I would keep her on the same regimen.      We will check a BMET today.  2. Dyslipidemia.  Her last lipids were actually done back in June      2008.  I would place her on a statin.  Her LDL at that time was 127      and HDL was 86.  We will follow up a LipoMed panel in 8 weeks' time      with AST and ALT.   I will set to see the patient back in a couple of months.  She is due to  see Dr. Donata Clay in a few weeks.     Pricilla Riffle, MD, [facility]  Electronically Signed    PVR/MedQ  DD: 08/24/2008  DT: 08/25/2008  Job #: [id number]   cc:   Theron Arista  Donata Clay, M.D.  Gordy Savers, MD

## 2011-04-08 NOTE — Cardiovascular Report (Signed)
NAME:  [name], [name]               ACCOUNT NO.:  [id number]   MEDICAL RECORD NO.:  [id number]          PATIENT TYPE:  OIB   LOCATION:  NA                           FACILITY:  [facility]   PHYSICIAN:  Arturo Morton. Riley Kill, MD, FACCDATE OF BIRTH:  1932/03/12   DATE OF PROCEDURE:  07/04/2008  DATE OF DISCHARGE:                            CARDIAC CATHETERIZATION   INDICATIONS:  [name] is a delightful 77 year old who has relatively  been asymptomatic.  She had a chest x-ray, suggested a aortic root  dilatation, and she was subsequently seen by Dr. Kathlee Nations Trigt for  further evaluation.  This was after a CT of the chest.  She subsequently  has been noted to have an aortic thoracic ascending aneurysm, and was  seen by Dr. Tenny Craw.  She is scheduled to have 2-D echocardiogram.  She has  a systolic ejection murmur, the echo is yet to be done.  She was set up  for left heart catheterization.   PROCEDURES:  1. Left heart catheterization.  2. Selective coronary arteriography.  3. Selective left ventriculography.  4. Aortic root aortography.   DESCRIPTION OF THE PROCEDURE:  The patient was brought to the  catheterization laboratory and prepped and draped in usual fashion.  Through an anterior puncture, the right femoral artery was easily  entered.  A 4-French sheath was then placed.  Following this, we  initially tried to use a 5 and then a 4 curved catheter.  These were  engaged to the left coronary artery.  Following this, we placed a  pigtail into the left ventricle and left ventricular pressures were  measured.  Ventriculography was then performed in the RAO projection.  A  pressure pullback was then performed without complication.  This  revealed approximate 5- to 10-mm aortic valve gradient consistent with  her physical examination.  Following this, central aortography was  performed to better identify the aortic root.  We then used a known  total right coronary catheter to engage the right  coronary and a 6-curve  left coronary to engage the left coronary artery.  Views of both  arteries were then obtained without difficulty.  About this time, we  were called emergently for an acute myocardial infarction coming by  [employer] to the cardiac catheterization laboratory in  the main lab.  The procedure was completed.  I reviewed the films  quickly with her son and discussed this with the patient.  She was put  into the holding area in satisfactory clinical condition.   HEMODYNAMIC DATA:  1. The initial left ventricular pressure was 125/13.  2. Aortic pressure 115/50.  3. The mean gradient across the aortic valve varied between 3 and 9      mm.  4. Peak-to-peak gradient was approximately 6 mm on pullback.   ANGIOGRAPHIC DATA:  1. Ventriculography was done in the RAO projection.  Overall, LV      function appeared to be vigorous without segmental wall motion      abnormality.  Ejection fraction was thought to exceed 60%.  2. The aortic root demonstrates no significant  aortic regurgitation.      There is marked thoracic aortic root dilatation consistent with      thoracic aneurysm.  The coronaries are relatively well seen.  The      aortic leaflets appear to move reasonably well.  The thoracic      aneurysm has been previously described on the CT scan.  3. The right coronary artery is a very large-caliber vessel providing      a posterior descending and posterolateral system, it is large in      caliber and free of critical disease.  4. The left main is free of critical disease.  5. The LAD courses to the apex.  There is a first diagonal branch that      probably somewhere between 38% and 50% narrowing, but it is a      relatively small branch.  There was then a focal stenosis in the      mid LAD.  In various views, this varies between about 50% and 70%      luminal reduction.  It does not appear to be critical, but is      calcified and extent of hemodynamic  significance is uncertain.      After this, there is mild luminal irregularity of the remainder of      the LAD, but it courses to the apex without critical obstruction.      Likewise, there is a second large diagonal branch without      significant focal narrowing.  The circumflex consists predominantly      of a large marginal branch and it focally appears to be free of      critical disease.   CONCLUSION:  1. Well-preserved left ventricular function.  2. Thoracic aortic aneurysm.  3. Mild gradient across the aortic valve.  4. A 50-70% mid-left anterior descending stenosis.  5. Approximate 40-50% diagonal stenosis.  6. No significant right coronary artery obstruction.  7. No significant circumflex obstruction.   DISPOSITION:  The patient will follow up with Dr. Donata Clay and Dr.  Tenny Craw.      Arturo Morton. Riley Kill, MD, [facility]  Electronically Signed     TDS/MEDQ  D:  07/04/2008  T:  07/05/2008  Job:  [id number]   cc:   Pricilla Riffle, MD, [facility]  Kerin Perna, M.D.  Ollen Gross, M.D.  Gordy Savers, MD

## 2011-04-08 NOTE — Consult Note (Signed)
NAME:  [name], [name]               ACCOUNT NO.:  [id number]   MEDICAL RECORD NO.:  [id number]          PATIENT TYPE:  EMS   LOCATION:  MAJO                         FACILITY:  [facility]   PHYSICIAN:  Donalee Citrin, M.D.        DATE OF BIRTH:  04-03-1932   DATE OF CONSULTATION:  09/25/2008  DATE OF DISCHARGE:  09/25/2008                                 CONSULTATION   REASON FOR CONSULTATION:  Possible C-spine fracture, C4, C5.   HISTORY OF PRESENT ILLNESS:  The patient is a 77 year old female with  significant past medical history over the last few months that include  prepped for knee surgery revealing aneurysm on her heart and blockage.  Today, the patient underwent cardiac surgery for this, however was  walking and stumbled early today, hitting the right side of her head  against the car.  She denies any loss of conscious.  She denies any  nausea, vomiting, and headaches.  Denies any numbness, tingling in her  face, arms, and legs and currently she denies any neck pain.   PAST MEDICAL HISTORY:  Remarkable hypertension, hypercholesteremia.  She  is status post cardiac surgery for the some sort of cardiac aneurysm as  well as blockage and the patient's surgical history is currently  unavailable and noncontributory.   MEDICATIONS:  Include, aspirin, hydrochlorothiazide, metoprolol,  simvastatin, and Zyrtec.   ALLERGIES:  She has allergies to PENICILLIN and tetanus.   PHYSICAL EXAMINATION:  GENERAL:  On exam, the patient is a very pleasant  awake and alert 77 year old female in no acute distress.  HEENT:  Within limits.  Her pupils are equal, round and reactive to  light.  Extraocular movements are intact.   Cranial nerves are intact.  Strength is 5/5 in her deltoid, biceps,  triceps, wrist flexors, and hand intrinsics.  Lower extremity strength  is 5/5 in iliopsoas, quads, hamstrings, gastrocnemius, and tibialis.  There is normal symmetric reflexes and sensation.  NECK:  Nontender both  paraspinally and centrally.  She actually has  really good range of motion of her neck with no neck pain.   A CT scan raised the question of possible fractures on the C4 and C5  facet complex as well as question of first facet, however there is  marked degenerative changes throughout her neck with marked facet  arthropathy throughout her cervical spine and this possibly could just  represent degenerative changes especially, considering she has no neck  pain, no prevertebral swelling on her CT scan.  She does have a  subluxation about 3-mm in C4 and 5, which could be degenerative  physiologic, so recommended MRI scan to rule out overt ligamentous  injury or possible disk injury and if the MRI is negative then we will  be able to take the patient off her hard collar and fit her out with a  soft collar.  She will taught that we may have to leave her in a hard collar or  consider a surgical intervention depending on the extent of the  ligamentous injury or disk injury on the MRI.  So, we  will await the  results of the MRI and go from there.           ______________________________  Donalee Citrin, M.D.     GC/MEDQ  D:  09/25/2008  T:  09/26/2008  Job:  [id number]

## 2011-04-08 NOTE — Op Note (Signed)
NAME:  [name], [name]               ACCOUNT NO.:  [id number]   MEDICAL RECORD NO.:  [id number]          PATIENT TYPE:  INP   LOCATION:  0006                         FACILITY:  [facility]   PHYSICIAN:  Ollen Gross, M.D.    DATE OF BIRTH:  June 21, 1932   DATE OF PROCEDURE:  03/19/2009  DATE OF DISCHARGE:                               OPERATIVE REPORT   PREOPERATIVE DIAGNOSIS:  Osteoarthritis, right knee.   POSTOPERATIVE DIAGNOSIS:  Osteoarthritis, right knee.   PROCEDURE:  Right total knee arthroplasty.   SURGEON:  Dr. Lequita Halt.   ASSISTANT:  Avel Peace, P.A.-C.   ANESTHESIA:  General with postoperative Marcaine pain pump.   ESTIMATED BLOOD LOSS:  Minimal.   DRAINS:  None.   TOURNIQUET TIME:  32 minutes at 300 mmHg.   COMPLICATIONS:  None.   CONDITION:  Stable to recovery.   BRIEF CLINICAL NOTE:  [name] is a 77 year old female with end-stage  valgus arthritis of the right knee with progressively worsening pain and  dysfunction.  She has failed nonoperative management and presents for  total knee arthroplasty.   PROCEDURE IN DETAIL:  After successful administration of general  anesthetic, a tourniquet was placed high on the right thigh and right  lower extremity prepped and draped in the usual sterile fashion.  Extremity was wrapped in Esmarch, knee flexed and tourniquet inflated to  300 mmHg.  Midline incision was made with a 10 blade through  subcutaneous tissue to the level of the extensor mechanism.  A fresh  blade was used to make a lateral parapatellar arthrotomy.  Soft tissue  on the proximal and lateral tibia was subperiosteally elevated to the  joint with a knife and around the posterolateral corner but not  including the structures of the posterolateral corner.  Patella was  everted medially, knee flexed 90 degrees and ACL and PCL removed.  Drill  was used create a starting hole in the distal femur, and the canal was  thoroughly irrigated.  The 5 degrees right  valgus alignment guide was  placed and referencing off the posterior condyles rotations marked and  the block pinned to remove 11 mm off the distal femur.  I took 11  because of preoperative flexion contracture.  Distal femoral resection  was made with an oscillating saw.  Sizing block was placed, and size  three was most appropriate.  Rotations was marked off the epicondylar  axis.  Size 3 cutting block was placed and the anterior, posterior and  chamfer cuts made.   Tibia subluxed forward and menisci removed.  Extramedullary tibial  alignment guide was placed referencing proximally at the medial aspect  of the tibial tubercle and distally along the second metatarsal axis and  tibial crest.  Block was pinned to remove about 2 mm off the more  deficient lateral side.  Tibial resection was made with an oscillating  saw.  Size 3 was the most appropriate tibial component, and the proximal  tibia was prepared a modular drill and keel punch for the size 3.  Femoral preparation was completed the intercondylar cut.   Size 3  mobile bearing tibial trial, size 3 posterior stabilized femoral  trial and a 10-mm posterior stabilized rotating platform insert trial  were placed.  With a 10, she hyperextended a little bit, so I went to a  12.5 which allowed for full extension with excellent varus-valgus and  anterior-posterior balance throughout full range of motion.  Patella was  everted and thickness measured to be 21 mm.  Freehand resection was  taken to 12 mm, 35 template was placed, lug holes were drilled, trial  patella was placed and it tracked normally.  Osteophytes were removed  off the posterior femur with the trial in place.  All trials were  removed, and the cut bone surfaces were prepared with pulsatile lavage.  Cement was mixed, and once ready for implantation, the size 8 posterior  stabilized femur and 35 patella were cemented into place, and the  patella was held with a clamp.  Trial  12.5-mm insert was placed and knee  held in full extension and all extruded cement removed.  When the cement  was fully hardened, then the permanent 12.5-mm posterior stabilized  rotating platform insert was placed in the tibial tray.  Wound was  copiously with irrigated saline solution.  The FloSeal was then injected  onto the posterior capsule and into the medial and lateral gutters and  suprapatellar area.  Moist sponge was placed, and tourniquet was  released for total time of 32 minutes.  Sponge was held for 2 minutes  and then removed.  Minimal bleeding was encountered.  Bleeding that was  encountered was stopped with electrocautery.  The arthrotomy was closed  with interrupted #1 PDS leaving open a small area from the superior to  inferior pole of patella to serve as a mini lateral release.  Flexion  against gravity was approximately 140 degrees.  Subcutaneous tissue was  closed with interrupted 2-0 Vicryl and subcuticular running 4-0  Monocryl.  Catheter for Marcaine pain pump was placed and the pump  initiated.  Steri-Strips and a bulky sterile dressing were applied.  She  was placed into a knee immobilizer, awakened and transported to recovery  in stable condition.      Ollen Gross, M.D.  Electronically Signed     FA/MEDQ  D:  03/19/2009  T:  03/19/2009  Job:  [id number]

## 2011-04-08 NOTE — H&P (Signed)
NAME:  [name], [name]               ACCOUNT NO.:  [id number]   MEDICAL RECORD NO.:  [id number]          PATIENT TYPE:  INP   LOCATION:  NA                           [facility]ACILITY[facility]   PHYSICIAN:  Ollen Gross, M.D.    DATE OF BIRTH:  26-Aug-1932   DATE OF ADMISSION:  06/19/2008  DATE OF DISCHARGE:                              HISTORY & PHYSICAL   CHIEF COMPLAINT:  Right knee pain.   HISTORY OF PRESENT ILLNESS:  The patient is a 77-year female whose  ongoing right knee pain has been interfering with her life and has been  causing more dysfunction.  She has significant arthritis, bone-on-bone.  She has been treated conservatively in the past and it is felt she would  benefit from undergoing surgical intervention.  Risks and benefits  discussed.  The patient subsequently admitted to the hospital.   ALLERGIES:  TETANUS and PENICILLIN.   CURRENT MEDICATIONS:  Hydrochlorothiazide, Fosamax, Xyzal, multivitamin,  hydrocodone, furosemide, vitamin E and vitamin D.   PAST MEDICAL HISTORY:  1. History of hives and pruritus, secondary to stress, no flare for 3      years.  2. Mild hypertension.  3. Mild reflux.  4. Hemorrhoids.  5. Irritable bowel syndrome.  6. Diverticulosis.  7. Postmenopausal.  8. History of childhood illnesses to include measles and mumps.   PAST SURGICAL HISTORY:  1. Tonsillectomy.  2. Two breast biopsies.  3. Several D&Cervical spine.   SOCIAL HISTORY:  Widowed, nonsmoker.  No alcohol.  Two children.  Lives  alone.  Patient would like to look into a rehab facility.   FAMILY HISTORY:  Father, history of stroke, died at age 51.  Mother  passed away at 28 with congestive heart failure.   REVIEW OF SYSTEMS:  GENERAL:  No fevers, chills, night sweats.  NEURO:  No seizures, syncope or paralysis.  RESPIRATORY:  No shortness of  breath, productive cough or hemoptysis.  CARDIOVASCULAR:  Chest pain, no  orthopnea.  GI: No nausea, vomiting, diarrhea or constipation.   GU:  A  little bit of urinary frequency.  No dysuria, hematuria.  MUSCULOSKELETAL: Right knee.   PHYSICAL EXAMINATION:  VITAL SIGNS:  Pulse 74, respirations 12, blood  pressure 142/70.  GENERAL:  A 77 year old female well-nourished, well-developed, very  pleasant, alert and cooperative.  HEENT: Normocephalic, atraumatic.  Pupils round and reactive.  EOMs intact.  NECK:  Supple.  CHEST:  Clear.  HEART:  Regular rate and rhythm with a grade 2-3/6, early systolic  ejection murmur best heard over the aortic point.  ABDOMEN:  Soft, slightly round.  Bowel sounds present.  RECTAL, BREASTS, GENITALIA:  Not done, not pertinent to present illness.  EXTREMITIES:  Right knee no effusion significant crepitus.  Range of  motion 10-120.   IMPRESSION:  Osteoarthritis right knee.   PLAN:  The patient was admission to [facility] to undergo a  right total knee replacement arthroplasty.  Surgery will be performed by  Dr. Ollen Gross.      Alexzandrew L. Perkins, P.A.C.      Ollen Gross, M.D.  Electronically  Signed    ALP/MEDQ  D:  06/18/2008  T:  06/18/2008  Job:  [id number]   cc:   Gordy Savers, MD  [address] [place]  [place] [address]

## 2011-04-08 NOTE — Assessment & Plan Note (Signed)
HEALTHCARE                            CARDIOLOGY OFFICE NOTE   NAME:[name], [name]                      MRN:          [id number]  DATE:11/23/2008                            DOB:          09/18/1932    IDENTIFICATION:  [name] is a 77 year old woman who I have followed in  the clinic.  I last saw her in August 24, 2008.  She has a history of  thoracic aortic aneurysm and underwent repair on July 20, 2008, along  with CABG x1 (LIMA to LAD).   Since seen, she has been doing okay.  She has had some problems though  with unsteadiness on her feet.  Again, denies dizziness.  She has fallen  although couple of times, once downstairs.  She is not sure if it is her  right knee that gives out, when she will turn, she will lose her balance  and fall backwards.  She was at cardiac rehab and they suggested she be  seen for this in Balance Therapy, which I am not familiar with.   She denies chest pain.  No palpitations.   CURRENT MEDICATIONS:  1. Fosamax weekly.  2. Hydrochlorothiazide 25 daily.  3. Metoprolol 25 b.i.d.  4. Aspirin 81.  5. Vitamin D 2 tablets weekly.  6. Zyrtec daily.  7. Simvastatin 40.  8. Multivitamin.   PHYSICAL EXAMINATION:  GENERAL:  On exam, the patient is in no distress.  Blood pressure is 123/58, pulse is 85, and weight is 171.  NECK:  No visible JVD.  LUNGS:  Clear.  No rales.  CARDIAC:  Regular rate and rhythm.  S1 and S2.  No S3.  No murmurs.  ABDOMEN:  No hepatomegaly.  EXTREMITIES:  Incision sites in the leg are little erythematous.  No  discharge, not clearly infected.  No edema.   IMPRESSION:  1. Status post aortic repair and coronary artery bypass grafting, now      4 months from surgery, doing overall well from a cardiac      standpoint.  We would continue.  2. Dyslipidemia.  Recent lipid panel in November shows fair-to-good      control.  Total cholesterol 141 and LDL of 73 with a particle      number of 1065,  small LDL of 943, HDL is very good at 59, and      triglycerides of 47.  I would keep her on the same regimen for now.      Hopefully, if she has knee repaired, her activity will increase,      weight will go down, and these will improve.  3. Unsteadiness.  It does not sound like orthostasis.  She does have a      bad knee.  I am not sure what else this represents, but I am      concerned she is going to hurt herself.  I encouraged her to follow      up with Dr. Lequita Halt.  Also, I did look into the balance program,      which again I am not familiar with  for balance testing.  4. Orthopedic.  From a cardiac standpoint, she has recovered nicely.      We will forward this dictation to Dr. Kathlee Nations Trigt.  I      encouraged her to make her appointment with Dr. Lequita Halt and      hopefully, if she has her knee repaired, further falling will be      decreased.   I will set to see her in the summer.  Otherwise, continue on current  regimen.     Pricilla Riffle, MD, [facility]  Electronically Signed    PVR/MedQ  DD: 11/25/2008  DT: 11/26/2008  Job #: [id number]   cc:   Gordy Savers, MD  Kerin Perna, M.D.  Ollen Gross, M.D.

## 2011-04-08 NOTE — Assessment & Plan Note (Signed)
[facility]                            CARDIOLOGY OFFICE NOTE   NAME:Greiner, ROLENE ANDRADES                      MRN:          [id number]  DATE:07/13/2008                            DOB:          23-Apr-1932    IDENTIFICATION:  Ms. Mellette is a 77 year old woman who I saw earlier this  month.  She was referred by Dr. Kathlee Nations Trigt for cardiac  catheterization, pre-surgical repair for ascending aortic aneurysm.   Since seen, she has undergone cardiac catheterization by Dr. Bonnee Quin.  This showed the aortic root was markedly dilated.  There was  no aortic insufficiency.  The right coronary artery was very large and  free of critical disease.  Left main was normal.  LAD, D1 38-50%  narrowing, but it was a small branch with a focal stenosis in the mid  LAD that appeared to be 50-70%, did not appear to be critical, was  calcified.  Otherwise, large second diagonal was free of disease.  Circumflex was free of disease.   Since having a catheterization, she has done okay.   Current medicines include Fosamax 70 weekly, hydrochlorothiazide, and  Xyzal at bedtime.   PHYSICAL EXAMINATION:  GENERAL:  The patient is in no distress at rest.  VITAL SIGNS:  Blood pressure is 131/71, pulse is 78 and regular, and  weight 180.  LUNGS:  Clear.  CARDIAC:  Regular rate and rhythm.  S1 and S2.  No S3.  No significant  murmurs.  ABDOMEN:  Benign.  EXTREMITIES:  Right groin without hematoma or bruit.  Pulses 2+.   Echocardiogram showed normal LV size, normal LV thickness, the left  ventricle ejection fraction was 55-60%.  Aortic valve was mildly  increased.  Note, there was mild aortic insufficiency noted.  There was  moderate ascending aortic dilatation noted.  Aortic annulus was noted be  29 mm.   IMPRESSION:  1. Aortic aneurysm.  The patient is due to see Dr. Kathlee Nations Trigt      tomorrow.  I will hold off on aspirin therapy for right now, but      she will need to  start this.  2. Coronary artery disease.  This will be reviewed by Dr. Donata Clay.      Possible bypass at the time surgery.  Again hold on further Rx for      now.  3. Dyslipidemia.  Need to review lipids done by Dr. Amador Cunas.  The      patient needs to have these modified with a statin.   Follow up will be planned after probable surgery.     Pricilla Riffle, MD, [facility]  Electronically Signed    PVR/MedQ  DD: 07/13/2008  DT: 07/14/2008  Job #: [id number]   cc:   Kerin Perna, M.D.

## 2011-04-08 NOTE — Assessment & Plan Note (Signed)
OFFICE VISIT   Foley, [name]  DOB:  Feb 16, 1932                                        July 14, 2008  CHART #:  [id number]   CURRENT PROBLEMS:  1. A 5.2 fusiform ascending aortic aneurysm.  2. Single vessel coronary artery disease with 70% stenosis of the      proximal left anterior descending artery.  3. Hypertension.  4. Trileaflet aortic valve with a transvalvular gradient less than 10      mm and mild aortic insufficiency.  5. Severe degenerative arthritis of the right knee, pending total knee      replacement.  6. History of the atopic dermatitis/hives controlled with oral      medication.   HISTORY OF PRESENT ILLNESS:  The patient is a 77 year old woman, who was  found to have a fusiform ascending aortic aneurysm measuring 5.2 cm  after preoperative chest x-ray before elective right knee replacement.  A CT scan further to define the anatomy, at which time I discussed the  situation with her and scheduled her for Cardiology consultation.  She  was seen by Dr. Dietrich Foley, who performed a 2-D echo which showed normal  LV function with some diastolic dysfunction and normal LV size.  The  aortic valve was trileaflet with mild AI and a transvalvular gradient of  5 or 6 mmHg.  There is no significant mitral valve pathology and the  pulmonary pressures by echo were normal.  She subsequently underwent a  left heart cath by Dr. Riley Foley, which demonstrated a right dominant  circulation with a normal right coronary, normal circumflex, and a 70%  stenosis of proximal LAD.  LVEDP was 14 mmHg.  An aortogram showed a  fusiform aneurysm extending from the sinotubular junction up to the  arch.  By CT angiogram, the distal arch and proximal descending thoracic  aorta appeared to be of normal caliber.  The patient now presents for  discussion of surgery to correct the fusiform aneurysm and the single  vessel coronary artery disease.  She has had no chest pain.   She  continues on her medications including hydrochlorothiazide, Claritin,  aspirin 81 mg daily, Fosamax 70 mg a week, and Xyzal 5 mg nightly for  her history of hives.  She is allergic to penicillin.   PHYSICAL EXAMINATION:  VITAL SIGNS:  Blood pressure 160/90, pulse 96,  respirations 18, saturation 97%.  GENERAL:  She is somewhat anxious and is accompanied by her son.  HEENT:  Normocephalic.  LUNGS:  Clear.  CARDIAC:  A soft 1/6 systolic murmur from the aortic sclerosis.  Heart  rhythm is regular.  There is no gallop.  ABDOMEN:  Obese.  The cath site in right groin is without hematoma.  EXTREMITIES:  She has mild ankle edema without tenderness.  NEUROLOGIC:  Intact.   LABORATORY DATA:  Reviewed the 2-D echo and coronary arteriograms with  the patient and son, and discussed her diagnosis of fusiform ascending  aneurysm without significant aortic valve pathology and with single  vessel coronary artery disease.   PLAN:  She will be scheduled for elective repair of her aneurysm and  single vessel coronary artery bypass grafting on July 20, 2008.  We  will plan on replacing the ascending aorta from the sinotubular junction  to the arch with a Hemashield graft  using hypothermic circulatory  arrest.  Plan on grafting the left IMA to her distal LAD for the single-  vessel stenosis of the proximal LAD.  I discussed the details of the  surgery with the patient and her son with the alternatives of the  surgery and the risks of surgery.  They understand and agreed to  proceed.  I gave the patient a prescription for Toprol-XL 25 mg a day to  start immediately as well as to continue 81 mg aspirin until the day  before surgery.  She is to report to the hospital for preop visit 48  hours before surgery, at which time she will get her carotid and  vascular Dopplers, chest x-ray, and blood work.   [name], M.D.  Electronically Signed   PV/MEDQ  D:  07/14/2008  T:  07/15/2008  Job:   [id number]   cc:   Colleen Riffle, MD, [facility]  Colleen Savers, MD

## 2011-04-08 NOTE — H&P (Signed)
NAMEGRISEL, BLUMENSTOCK               ACCOUNT NO.:  [id number]   MEDICAL RECORD NO.:  [id number]          PATIENT TYPE:  INP   LOCATION:  1604                         FACILITY:  [facility]   PHYSICIAN:  Ollen Gross, M.D.    DATE OF BIRTH:  1932-08-23   DATE OF ADMISSION:  03/19/2009  DATE OF DISCHARGE:                              HISTORY & PHYSICAL   CHIEF COMPLAINT:  Right knee pain.   HISTORY OF PRESENT ILLNESS:  The patient is a 77 year old female who has  been seen by Dr. Lequita Halt for ongoing right knee pain.  She has known  significant valgus arthritis which is end-stage.  It is felt at this  point she would benefit from undergoing surgical intervention.  Risks  and benefits have been discussed.  She elects to proceed with surgery.   ALLERGIES:  TETANUS AND PENICILLIN.   CURRENT MEDICATIONS:  Hydrochlorothiazide, Fosamax, multivitamin,  hydrocodone, adult aspirin 81 mg, metoprolol, Lomotil, simvastatin,  vitamin D, Zyrtec, hydroxyzine, prednisone, hydroxychloroquine.   PAST MEDICAL HISTORY:  1. Past history of pneumonia.  2. Hypertension.  3. Cardiac murmur.  4. Mild reflux.  5. Hypercholesterolemia.  6. History of urinary tract infections.  7. Postmenopausal.  8. History of hives and pruritus.  Nothing for the past 3 or 4 years.  9. Childhood illnesses of measles, mumps.   PAST SURGICAL HISTORY:  1. Tonsillectomy.  2. Two breast biopsies.  3. Several D and Cs.  4. Open heart surgery with a single bypass.   FAMILY HISTORY:  Father deceased age 46 with a history of stroke.  Mother passed away at age 77 with congestive heart failure.   SOCIAL HISTORY:  Widowed.  She is the owner of [employer].  Nonsmoker.  Two children.  Lives alone.  She wants to look into a rehab  stay.  She does have a living will, healthcare power of attorney.   REVIEW OF SYSTEMS:  GENERAL:  No fevers, chills, night sweats.  NEURO:  No seizures, syncope, paralysis.  RESPIRATORY: No shortness of  breath,  productive cough or hemoptysis.  CARDIOVASCULAR: No chest pain, angina,  orthopnea.  GI:  A little bit of diarrhea, has some IBS, no nausea,  vomiting, no constipation.  GU: No dysuria, hematuria or discharge.  MUSCULOSKELETAL:  Knee pain.   PHYSICAL EXAMINATION:  VITAL SIGNS: Pulse 80, respiratory rate 14, blood  pressure 142/62.  GENERAL:  A 77 year old female, well-nourished, well-developed, in no  acute distress, mildly anxious, alert and oriented, cooperative.  HEENT:  Normocephalic, atraumatic.  Pupils round and reactive.  EOMs  intact.  NECK:  Supple.  CHEST:  Clear.  HEART:  Regular rate and rhythm.  No murmur.  S1, S2 noted.  ABDOMEN:  Soft, nontender.  Bowel sounds present.  RECTAL/BREAST/GENITALIA:  Not done, not pertinent to present illness.  EXTREMITIES:  Right knee:  No effusion, valgus malalignment, deformity,  marked crepitus.  Range of motion 5-120.   IMPRESSION:  Osteoarthritis right knee.   PLAN:  The patient will be admitted to [facility] to undergo a  right total knee replacement arthroplasty.  Surgery will be performed by  Dr. Ollen Gross.      Alexzandrew L. Perkins, P.A.C.      Ollen Gross, M.D.  Electronically Signed    ALP/MEDQ  D:  03/21/2009  T:  03/21/2009  Job:  [id number]   cc:   Gordy Savers, MD  [address] [place]  [place] [address]   Kerin Perna, M.D.  [address]  [place]  [place] [address]   Pricilla Riffle, MD, [facility]  [address]  [address]  [place]  [place] [address]   Ollen Gross, M.D.  Fax: [phone]

## 2011-04-11 NOTE — Op Note (Signed)
[facility]  Patient:    [name], [name]                      MRN: [id number] Proc. Date: 04/14/00 Adm. Date:  [id number] Attending:  Amanda Cockayne                           Operative Report  PREOPERATIVE DIAGNOSIS:       Abnormal endometrial thickness.  POSTOPERATIVE DIAGNOSIS:      Abnormal endometrial thickness with large submucous fibroid.  OPERATION:  SURGEON:                      Esmeralda Arthur, M.D.  ASSISTANT:  ANESTHESIA:                   Heavy sedation with local.  PACKS:                        None.  CATHETERS:                    None.  MEDIUM:                       Sorbitol, deficit 150 cc.  ESTIMATED BLOOD LOSS:  DESCRIPTION OF PROCEDURE:     The patient was carried to the operating room under satisfactory anesthesia, placed in the lithotomy position, and prepped and draped in a sterile field.  The bladder was emptied by catheterization.  Examination revealed the uterus to feel midplane and slightly anterior.  No masses were felt in the adnexa.  A weighted speculum was placed in the posterior vagina.  The cervix was grasped  with a tenaculum and the uterus sounded to 9 cm.  The cervix was then progressively dilated to a #33 so we could admit the resectoscope.  We admitted the resectoscope and we could see a large area on the posterior floor of the endometrial cavity, a defect which initially I thought was a polyp, but it was a submucous fibroid being pushed up on the endometrium.  Using the resectoscope at 110 and 110 we began to Bovie this and this was when  realized it was a fibroid.  We I think removed 80 to 90% of the fibroid and began to coagulate bleeders.  We decreased the pressure slowly and good control, decreased to 30.  I do not think there was any bleeding.  We removed the scope nd watched the patient.  She had a little more bleeding than I thought she should, so we reinserted the  scope and coagulated some further places.  We then withdrew and observed her for two minutes.  She had minimal bleeding.  The patient was carried to the recovery room in good condition. DD:  04/14/00 TD:  04/14/00 Job: [id number]

## 2011-05-01 ENCOUNTER — Encounter: Payer: Self-pay | Admitting: Internal Medicine

## 2011-05-01 ENCOUNTER — Ambulatory Visit (INDEPENDENT_AMBULATORY_CARE_PROVIDER_SITE_OTHER): Payer: Medicare Other | Admitting: Internal Medicine

## 2011-05-01 DIAGNOSIS — I10 Essential (primary) hypertension: Secondary | ICD-10-CM

## 2011-05-01 DIAGNOSIS — I2510 Atherosclerotic heart disease of native coronary artery without angina pectoris: Secondary | ICD-10-CM

## 2011-05-01 DIAGNOSIS — M1990 Unspecified osteoarthritis, unspecified site: Secondary | ICD-10-CM

## 2011-05-01 NOTE — Progress Notes (Signed)
  Subjective:    Patient ID: [name], female    DOB: 05-Apr-1932, 77 y.o.   MRN: [id number]  HPI  77 year old patient who is seen today for follow up. She has a history of advanced osteoarthritis and most recently has had left total knee replacement surgery. She remains active with physical therapy. She is ambulatory with a walker do to gait instability and frequent falls.  She has coronary artery disease and is status post CABG she has been quite stable in August of 2009 she also underwent surgery for a thoracic aneurysm. She has allergic rhinitis which has been fairly stable she has chronic urticaria and a history of depression. She requires chronic narcotic analgesic due to her back and extremity pain    Review of Systems  HENT: Negative for hearing loss, congestion, sore throat, rhinorrhea, dental problem, sinus pressure and tinnitus.   Eyes: Negative for pain, discharge and visual disturbance.  Respiratory: Negative for cough and shortness of breath.   Cardiovascular: Negative for chest pain, palpitations and leg swelling.  Gastrointestinal: Negative for nausea, vomiting, abdominal pain, diarrhea, constipation, blood in stool and abdominal distention.  Genitourinary: Negative for dysuria, urgency, frequency, hematuria, flank pain, vaginal bleeding, vaginal discharge, difficulty urinating, vaginal pain and pelvic pain.  Musculoskeletal: Positive for back pain and gait problem. Negative for joint swelling and arthralgias.  Skin: Negative for rash.  Neurological: Positive for weakness. Negative for dizziness, syncope, speech difficulty, numbness and headaches.  Hematological: Negative for adenopathy.  Psychiatric/Behavioral: Negative for behavioral problems, dysphoric mood and agitation. The patient is not nervous/anxious.        Objective:   Physical Exam  Constitutional: She is oriented to person, place, and time. She appears well-developed and well-nourished.  HENT:  Head:  Normocephalic.  Right Ear: External ear normal.  Left Ear: External ear normal.  Mouth/Throat: Oropharynx is clear and moist.  Eyes: Conjunctivae and EOM are normal. Pupils are equal, round, and reactive to light.  Neck: Normal range of motion. Neck supple. No thyromegaly present.  Cardiovascular: Normal rate, regular rhythm, normal heart sounds and intact distal pulses.   Pulmonary/Chest: Effort normal and breath sounds normal.  Abdominal: Soft. Bowel sounds are normal. She exhibits no mass. There is no tenderness.  Musculoskeletal: Normal range of motion.  Lymphadenopathy:    She has no cervical adenopathy.  Neurological: She is alert and oriented to person, place, and time.  Skin: Skin is warm and dry. No rash noted.  Psychiatric: She has a normal mood and affect. Her behavior is normal.          Assessment & Plan:   Osteoarthritis status post recent left total knee replacement surgery Gait instability. Multifactorial Hypertension Coronary artery disease stable Chronic urticaria  Medical regimen discussed and refilled;  recheck in 6 months for her annual exam

## 2011-05-01 NOTE — Patient Instructions (Signed)
It is important that you exercise regularly, at least 20 minutes 3 to 4 times per week.  If you develop chest pain or shortness of breath seek  medical attention.  Return in 6 months for follow-up  Limit your sodium (Salt) intake

## 2011-05-05 ENCOUNTER — Other Ambulatory Visit: Payer: Self-pay | Admitting: Internal Medicine

## 2011-05-09 ENCOUNTER — Encounter: Payer: Self-pay | Admitting: Internal Medicine

## 2011-05-09 ENCOUNTER — Ambulatory Visit (INDEPENDENT_AMBULATORY_CARE_PROVIDER_SITE_OTHER): Payer: Medicare Other | Admitting: Internal Medicine

## 2011-05-09 DIAGNOSIS — R269 Unspecified abnormalities of gait and mobility: Secondary | ICD-10-CM

## 2011-05-09 DIAGNOSIS — R2681 Unsteadiness on feet: Secondary | ICD-10-CM

## 2011-05-09 DIAGNOSIS — I10 Essential (primary) hypertension: Secondary | ICD-10-CM

## 2011-05-09 NOTE — Patient Instructions (Signed)
Attempt to minimize analgesics  You need to lose weight.  Consider a lower calorie diet and regular exercise.  Consider neurological referral

## 2011-05-09 NOTE — Progress Notes (Signed)
  Subjective:    Patient ID: [name], female    DOB: [birth year], 77 y.o.   MRN: [id number]  HPI  77 year old patient who has a history of unsteady gait. She fell 7 days ago sustaining facial trauma. She states that she stood from a sitting position on a seated walker and fell forward striking her left facial area.    Review of Systems  Neurological: Positive for weakness.       Objective:   Physical Exam  Constitutional:       Overweight alert no distress. Blood pressure normal  HENT:       General a small hematoma involving her left frontal scalp area extensive hematoma formation over the left forehead and periorbital region  Eyes: Conjunctivae and EOM are normal. Pupils are equal, round, and reactive to light.  Neurological:       Extraocular muscles full          Assessment & Plan:   Facial trauma secondary to fall Gait instability. Multi-factorial with weight deconditioning arthritis and possibly analgesics all playing  a role. She'll consider neurological referral

## 2011-05-16 ENCOUNTER — Other Ambulatory Visit: Payer: Self-pay | Admitting: Internal Medicine

## 2011-05-20 ENCOUNTER — Encounter: Payer: Self-pay | Admitting: Internal Medicine

## 2011-05-21 ENCOUNTER — Other Ambulatory Visit: Payer: Self-pay | Admitting: Internal Medicine

## 2011-05-22 NOTE — Telephone Encounter (Signed)
#  90  RF 6

## 2011-05-22 NOTE — Telephone Encounter (Signed)
Was just rx's #30 on 05/05/11 - please advise

## 2011-06-03 ENCOUNTER — Other Ambulatory Visit: Payer: Self-pay

## 2011-06-03 NOTE — Telephone Encounter (Signed)
[facility]codone apap 10-650mg  Last seen 05/09/11   Last written 12/24/2010 #50 3RF Please advise

## 2011-06-03 NOTE — Telephone Encounter (Signed)
ok 

## 2011-06-04 MED ORDER — HYDROCODONE-ACETAMINOPHEN 5-325 MG PO TABS: 1.0000 | ORAL_TABLET | Freq: Four times a day (QID) | ORAL | Status: DC | PRN

## 2011-06-04 NOTE — Telephone Encounter (Signed)
F[facility]ed [facility] to [facility] 

## 2011-07-07 ENCOUNTER — Telehealth: Payer: Self-pay | Admitting: Internal Medicine

## 2011-07-07 NOTE — Telephone Encounter (Signed)
Wants Kim to return her call. Dr Ethelene Hal gave her shot 8 weeks ago. Now, she is having a hard time walking. She called their office and got an appt for next Wednesday (next Available). She has been taking more pain meds. She will see her neurologist tomorrow. Wants Kim's advice. Was told to call here first.

## 2011-07-08 NOTE — Telephone Encounter (Signed)
Attempt to call - VM - LMTCB if I can be of futher help , sorry I have not returned call before now - hope things went well at neuro. appt - that was the next logical step r/t to this problem. KIK

## 2011-07-11 ENCOUNTER — Other Ambulatory Visit: Payer: Self-pay | Admitting: Neurology

## 2011-07-11 DIAGNOSIS — M5416 Radiculopathy, lumbar region: Secondary | ICD-10-CM

## 2011-07-21 ENCOUNTER — Ambulatory Visit
Admission: RE | Admit: 2011-07-21 | Discharge: 2011-07-21 | Disposition: A | Discharge status: Home / Self Care | Payer: Medicare Other | Source: Ambulatory Visit | Attending: Neurology | Admitting: Neurology

## 2011-07-21 DIAGNOSIS — M5416 Radiculopathy, lumbar region: Secondary | ICD-10-CM

## 2011-08-21 ENCOUNTER — Other Ambulatory Visit: Payer: Self-pay | Admitting: Internal Medicine

## 2011-08-22 LAB — URINALYSIS, ROUTINE W REFLEX MICROSCOPIC
Bilirubin Urine: NEGATIVE
Ketones, ur: NEGATIVE
Nitrite: NEGATIVE
Protein, ur: NEGATIVE
Urobilinogen, UA: 0.2

## 2011-08-22 LAB — CBC
HCT: 36.4
Hemoglobin: 12.6
MCHC: 34.6
MCV: 87.3
Platelets: 236
RDW: 13.4

## 2011-08-22 LAB — COMPREHENSIVE METABOLIC PANEL
Albumin: 3.5
Alkaline Phosphatase: 72
BUN: 13
Calcium: 9.3
Creatinine, Ser: 0.61
Glucose, Bld: 99
Potassium: 4
Total Protein: 6.2

## 2011-08-22 LAB — APTT: aPTT: 28

## 2011-08-22 LAB — URINE MICROSCOPIC-ADD ON

## 2011-08-22 LAB — PROTIME-INR
INR: 1
Prothrombin Time: 13.1

## 2011-08-27 LAB — BASIC METABOLIC PANEL
BUN: 12
CO2: 31
Calcium: 8.6
Chloride: 95 — ABNORMAL LOW
Creatinine, Ser: 0.69
GFR calc Af Amer: >60
GFR calc non Af Amer: >60
Glucose, Bld: 113 — ABNORMAL HIGH
Potassium: 3.2 — ABNORMAL LOW
Sodium: 135

## 2011-08-27 LAB — GLUCOSE, CAPILLARY
Glucose-Capillary: 109 — ABNORMAL HIGH
Glucose-Capillary: 111 — ABNORMAL HIGH
Glucose-Capillary: 74
Glucose-Capillary: 75
Glucose-Capillary: 82
Glucose-Capillary: 88
Glucose-Capillary: 88
Glucose-Capillary: 90
Glucose-Capillary: 95
Glucose-Capillary: 96

## 2011-08-28 ENCOUNTER — Other Ambulatory Visit: Payer: Self-pay | Admitting: Internal Medicine

## 2011-09-03 ENCOUNTER — Other Ambulatory Visit: Payer: Self-pay | Admitting: Internal Medicine

## 2011-09-18 ENCOUNTER — Other Ambulatory Visit: Payer: Self-pay

## 2011-09-18 NOTE — Telephone Encounter (Signed)
Fax refill request for hydrocodone acteamin. 10-650mg   Last seen 05/09/11 Last witten 06/04/11 #50 3RF Please advise

## 2011-09-19 MED ORDER — HYDROCODONE-ACETAMINOPHEN 10-650 MG PO TABS: 1.0000 | ORAL_TABLET | Freq: Four times a day (QID) | ORAL | Status: AC | PRN

## 2011-09-19 NOTE — Telephone Encounter (Signed)
ok 

## 2011-09-19 NOTE — Telephone Encounter (Signed)
[facility] 

## 2011-09-30 ENCOUNTER — Other Ambulatory Visit: Payer: Self-pay | Admitting: Internal Medicine

## 2011-10-10 ENCOUNTER — Other Ambulatory Visit: Payer: Self-pay | Admitting: Neurology

## 2011-10-10 ENCOUNTER — Ambulatory Visit
Admission: RE | Admit: 2011-10-10 | Discharge: 2011-10-10 | Disposition: A | Discharge status: Home / Self Care | Payer: Medicare Other | Source: Ambulatory Visit | Attending: Neurology | Admitting: Neurology

## 2011-10-10 DIAGNOSIS — R52 Pain, unspecified: Secondary | ICD-10-CM

## 2011-10-15 ENCOUNTER — Telehealth: Payer: Self-pay | Admitting: Internal Medicine

## 2011-10-15 NOTE — Telephone Encounter (Signed)
Pt fell at 4:30am this morning because she was having problems with balance and has a gash on forehead over lft eye. Pt went to eye doctor,and there was no damage of eye, but the doctor recommended that pt go see her pcp for stitches, or should pt go to ER? Pls advise.

## 2011-10-15 NOTE — Telephone Encounter (Signed)
Per Dr. Caryl Never, have pt go to URGENT CARE.

## 2011-10-31 ENCOUNTER — Encounter: Payer: Self-pay | Admitting: Internal Medicine

## 2011-10-31 ENCOUNTER — Ambulatory Visit (INDEPENDENT_AMBULATORY_CARE_PROVIDER_SITE_OTHER): Payer: Medicare Other | Admitting: Internal Medicine

## 2011-10-31 DIAGNOSIS — R269 Unspecified abnormalities of gait and mobility: Secondary | ICD-10-CM

## 2011-10-31 DIAGNOSIS — I25810 Atherosclerosis of coronary artery bypass graft(s) without angina pectoris: Secondary | ICD-10-CM

## 2011-10-31 DIAGNOSIS — E785 Hyperlipidemia, unspecified: Secondary | ICD-10-CM

## 2011-10-31 DIAGNOSIS — I10 Essential (primary) hypertension: Secondary | ICD-10-CM

## 2011-10-31 DIAGNOSIS — R2681 Unsteadiness on feet: Secondary | ICD-10-CM

## 2011-10-31 DIAGNOSIS — M1990 Unspecified osteoarthritis, unspecified site: Secondary | ICD-10-CM

## 2011-10-31 NOTE — Patient Instructions (Signed)
Limit your sodium (Salt) intake  Return in 6 months for follow-up  

## 2011-10-31 NOTE — Progress Notes (Signed)
  Subjective:    Patient ID: [name], female    DOB: 20-Oct-1932, 77 y.o.   MRN: [id number]  HPI  77 year old patient is seen today for her six-month followup. She is followed by multiple consultants including neurology orthopedics and rheumatology. She has had another fall resulted in some facial trauma. She has significant   arthritis gait instability . She has coronary artery disease and is status post CABG. She is doing quite well denies any cardiopulmonary complaints    Review of Systems  Constitutional: Negative.   HENT: Negative for hearing loss, congestion, sore throat, rhinorrhea, dental problem, sinus pressure and tinnitus.   Eyes: Negative for pain, discharge and visual disturbance.  Respiratory: Negative for cough and shortness of breath.   Cardiovascular: Negative for chest pain, palpitations and leg swelling.  Gastrointestinal: Negative for nausea, vomiting, abdominal pain, diarrhea, constipation, blood in stool and abdominal distention.  Genitourinary: Negative for dysuria, urgency, frequency, hematuria, flank pain, vaginal bleeding, vaginal discharge, difficulty urinating, vaginal pain and pelvic pain.  Musculoskeletal: Positive for back pain, arthralgias and gait problem. Negative for joint swelling.  Skin: Negative for rash.  Neurological: Negative for dizziness, syncope, speech difficulty, weakness, numbness and headaches.  Hematological: Negative for adenopathy.  Psychiatric/Behavioral: Negative for behavioral problems, dysphoric mood and agitation. The patient is not nervous/anxious.        Objective:   Physical Exam  Constitutional: She is oriented to person, place, and time. She appears well-developed and well-nourished. No distress.       Weight 177 Blood pressure 112/72  Walks with a 4. walker  HENT:  Head: Normocephalic.  Right Ear: External ear normal.  Left Ear: External ear normal.  Mouth/Throat: Oropharynx is clear and moist.  Eyes: Conjunctivae  and EOM are normal. Pupils are equal, round, and reactive to light.  Neck: Normal range of motion. Neck supple. No thyromegaly present.  Cardiovascular: Normal rate, regular rhythm, normal heart sounds and intact distal pulses.   Pulmonary/Chest: Effort normal and breath sounds normal.  Abdominal: Soft. Bowel sounds are normal. She exhibits no mass. There is no tenderness.  Musculoskeletal: Normal range of motion. She exhibits edema.       Right greater than left leg edema  Lymphadenopathy:    She has no cervical adenopathy.  Neurological: She is alert and oriented to person, place, and time.  Skin: Skin is warm and dry. No rash noted.  Psychiatric: She has a normal mood and affect. Her behavior is normal.          Assessment & Plan:   Coronary artery disease stable Hypertension well controlled Gait instability Osteoarthritis  Followup orthopedics rheumatology and neurology Recheck in 6 months

## 2011-12-12 ENCOUNTER — Other Ambulatory Visit: Payer: Self-pay

## 2011-12-12 MED ORDER — FUROSEMIDE 40 MG PO TABS: 40.0000 mg | ORAL_TABLET | ORAL | Status: DC

## 2011-12-23 ENCOUNTER — Ambulatory Visit: Payer: Medicare Other | Admitting: *Deleted

## 2012-01-06 ENCOUNTER — Other Ambulatory Visit: Payer: Self-pay

## 2012-01-06 MED ORDER — HYDROCODONE-ACETAMINOPHEN 10-650 MG PO TABS: 1.0000 | ORAL_TABLET | Freq: Four times a day (QID) | ORAL | Status: AC | PRN

## 2012-01-06 NOTE — Telephone Encounter (Signed)
ok 

## 2012-01-06 NOTE — Telephone Encounter (Signed)
Called in.

## 2012-01-06 NOTE — Telephone Encounter (Signed)
[facility] [address] for hydrocod-apap 10/650mg  Last seen 10/31/11  Last written 09/19/11 # 50 3RF Please advise

## 2012-01-23 ENCOUNTER — Ambulatory Visit (INDEPENDENT_AMBULATORY_CARE_PROVIDER_SITE_OTHER): Payer: Medicare Other | Admitting: Internal Medicine

## 2012-01-23 ENCOUNTER — Encounter: Payer: Self-pay | Admitting: Internal Medicine

## 2012-01-23 DIAGNOSIS — I10 Essential (primary) hypertension: Secondary | ICD-10-CM

## 2012-01-23 DIAGNOSIS — I2510 Atherosclerotic heart disease of native coronary artery without angina pectoris: Secondary | ICD-10-CM

## 2012-01-23 DIAGNOSIS — E785 Hyperlipidemia, unspecified: Secondary | ICD-10-CM

## 2012-01-23 DIAGNOSIS — R0602 Shortness of breath: Secondary | ICD-10-CM

## 2012-01-23 NOTE — Assessment & Plan Note (Signed)
Keep on statin.  Will need to f/u on lipids.

## 2012-01-23 NOTE — Patient Instructions (Signed)

## 2012-01-23 NOTE — Assessment & Plan Note (Signed)
Patient with 1V CABG in 2009.  No CP but SOB.  I am not convinced this is due to cardiac ischemia.  I would recomm echo to evaluate prior to surgery. If LVEF is normal, I feel she is a rel low risk for major cardiac event and ok to proceed without further testing.

## 2012-01-23 NOTE — Progress Notes (Signed)
HPI [name] is a 78 year old with a history of CAD and aortic aneurysm (s/p CABG and repair in Aug 2009). Also a history of DJD, hypertension, RA, dislipidemia I saw her back in May 2012.   Since seen she has continued to have problems with [name], her balance (has fallen, broken arm) as well as back pain.  She is going to have back surgery in a couple weeks. She denies CP.  She does get SOB if she pushes herself to do things too quickly. Allergies  Allergen Reactions  . Amoxicillin     REACTION: unspecified  . Cyclobenzaprine Hcl     REACTION: rash  . Penicillins   . Tetanus Toxoid     Current Outpatient Prescriptions  Medication Sig Dispense Refill  . aspirin 81 MG tablet Take 81 mg by mouth daily.        . cetirizine (ZYRTEC) 10 MG tablet Take 10 mg by mouth daily as needed.        . folic acid (FOLVITE) 1 MG tablet Take 1 mg by mouth 2 (two) times daily.        . furosemide (LASIX) 40 MG tablet Take 40 mg by mouth 2 (two) times a week. AS NEEDED      . hydrochlorothiazide (HYDRODIURIL) 25 MG tablet TAKE 1 TABLET BY MOUTH EVERY MORNING  90 tablet  3  . HYDROcodone-acetaminophen (NORCO) 5-325 MG per tablet As directed      . hydroxychloroquine (PLAQUENIL) 200 MG tablet Take 200 mg by mouth 2 (two) times daily.        . hydrOXYzine (ATARAX) 25 MG tablet TAKE 1 TABLET BY MOUTH EVERY 4 HOURS AS NEEDED  90 tablet  6  . methotrexate 2.5 MG tablet Take by mouth. 6 tablets once weekly       . metoprolol (TOPROL-XL) 50 MG 24 hr tablet TAKE 1 TABLET BY MOUTH EVERY DAY  30 tablet  10  . Multiple Vitamin (MULTIVITAMIN) tablet Take 1 tablet by mouth daily.        . potassium chloride SA (KLOR-CON M20) 20 MEQ tablet       . simvastatin (ZOCOR) 40 MG tablet TAKE 1 TABLET EVERY DAY  90 tablet  1  . DISCONTD: furosemide (LASIX) 40 MG tablet Take 1 tablet (40 mg total) by mouth 2 (two) times a week.  90 tablet  3  . DISCONTD: KLOR-CON M20 20 MEQ tablet TAKE 1 TABLET EVERY OTHER DAY  90 tablet  3     Past Medical History  Diagnosis Date  . CORONARY ATHEROSCLEROSIS, ARTERY BYPASS GRAFT 08/23/2008  . DEPRESSION 05/21/2007  . FIBROCYSTIC BREAST DISEASE, HX OF 05/21/2007  . HYPERLIPIDEMIA-MIXED 11/26/2008  . HYPERTENSION 05/11/2008  . Irritable bowel syndrome 05/21/2007  . KNEE PAIN 09/16/2007  . LEG EDEMA, BILATERAL 05/21/2009  . OSTEOARTHRITIS 01/20/2008  . OSTEOPENIA 05/21/2007  . URTICARIA, CHRONIC 05/21/2007  . DJD (degenerative joint disease)     history of   . History of aortic aneurysm     resection and grafting of a 5.2-cm ascending aortic arch aneurysm August 2009  . Dyslipidemia   . Vitamin d deficiency   . Urticaria   . Rheumatoid arthritis   . Neuropathy     left radial  neuropathy with wrist drop  . CAD (coronary artery disease)     s/p CABG -  x 1 with LIMA to LAD August 2009 /   . Diverticulosis   . Irritable bowel syndrome   .  GERD (gastroesophageal reflux disease)     Past Surgical History  Procedure Date  . Appendectomy   . Cesarean section   . Hernia repair   . Coronary artery bypass graft August 2009    CABG x 1 with LIMA to LAD /  . Ascending aortic aneurysm repair August 2009     resection and grafting of a 5.2-cm ascending aortic arch aneurysm    Family History  Problem Relation Age of Onset  . Heart disease    . Coronary artery disease      History   Social History  . Marital Status: Widowed    Spouse Name: N/A    Number of Children: N/A  . Years of Education: N/A   Occupational History  . Not on file.   Social History Main Topics  . Smoking status: Never Smoker   . Smokeless tobacco: Not on file  . Alcohol Use: No  . Drug Use: Not on file  . Sexually Active: Not on file   Other Topics Concern  . Not on file   Social History Narrative  . No narrative on file    Review of Systems:  All systems reviewed.  They are negative to the above problem except as previously stated.  Vital Signs: BP 142/68  Pulse 72  Ht 5\' 3"  (1.6 m)   Wt 174 lb (78.926 kg)  BMI 30.82 kg/m2  Physical Exam  Patient is in NAD  Examined in chair.  HEENT:  Normocephalic, atraumatic. EOMI, PERRLA.  Neck: JVP is normal. No thyromegaly. No bruits.  Lungs: clear to auscultation. No rales no wheezes.  Heart: Regular rate and rhythm. Normal S1, S2. No S3.   No significant murmurs. PMI not displaced.  Abdomen:  Supple, nontender. Normal bowel sounds. No masses. No hepatomegaly.  Extremities:   Good distal pulses throughout. Tr lower extremity edema.  Musculoskeletal :moving all extremities.  Neuro:   alert and oriented x3.  CN II-XII grossly intact.  EKG:  SR  72 bpm.     Assessment and Plan:

## 2012-01-23 NOTE — Assessment & Plan Note (Signed)
Fair control.

## 2012-01-26 ENCOUNTER — Encounter (HOSPITAL_COMMUNITY)
Admission: RE | Admit: 2012-01-26 | Discharge: 2012-01-26 | Disposition: A | Discharge status: Home / Self Care | Payer: Medicare Other | Source: Ambulatory Visit | Attending: Anesthesiology | Admitting: Anesthesiology

## 2012-01-26 ENCOUNTER — Telehealth: Payer: Self-pay | Admitting: *Deleted

## 2012-01-26 ENCOUNTER — Encounter (HOSPITAL_COMMUNITY)
Admission: RE | Admit: 2012-01-26 | Discharge: 2012-01-26 | Disposition: A | Discharge status: Home / Self Care | Payer: Medicare Other | Source: Ambulatory Visit | Attending: Cardiology | Admitting: Cardiology

## 2012-01-26 ENCOUNTER — Encounter (HOSPITAL_COMMUNITY): Payer: Self-pay | Admitting: Pharmacy Technician

## 2012-01-26 ENCOUNTER — Encounter (HOSPITAL_COMMUNITY): Payer: Self-pay

## 2012-01-26 DIAGNOSIS — E782 Mixed hyperlipidemia: Secondary | ICD-10-CM

## 2012-01-26 HISTORY — DX: Acute candidiasis of vulva and vagina: B37.31

## 2012-01-26 HISTORY — DX: Candidiasis of vulva and vagina: B37.3

## 2012-01-26 HISTORY — DX: Injury of radial nerve at forearm level, unspecified arm, initial encounter: S54.20XA

## 2012-01-26 LAB — CBC
Hemoglobin: 12.2 g/dL (ref 12.0–15.0)
MCH: 30.3 pg (ref 26.0–34.0)
MCV: 88.1 fL (ref 78.0–100.0)
Platelets: 225 10*3/uL (ref 150–400)
RBC: 4.03 MIL/uL (ref 3.87–5.11)
WBC: 8.1 10*3/uL (ref 4.0–10.5)

## 2012-01-26 LAB — BASIC METABOLIC PANEL
CO2: 28 mEq/L (ref 19–32)
Calcium: 9.8 mg/dL (ref 8.4–10.5)
Chloride: 100 mEq/L (ref 96–112)
Glucose, Bld: 94 mg/dL (ref 70–99)
Potassium: 3.5 mEq/L (ref 3.5–5.1)
Sodium: 139 mEq/L (ref 135–145)

## 2012-01-26 LAB — SURGICAL PCR SCREEN: Staphylococcus aureus: NEGATIVE

## 2012-01-26 NOTE — Pre-Procedure Instructions (Signed)
[name]  01/26/2012   Your procedure is scheduled on:  02/02/2012  Report to [facility] at 5:30 AM.  Call this number if you have problems the morning of surgery: [phone]   Remember:   Do not eat food:After Midnight.  May have clear liquids: up to 4 Hours before arrival.  Clear liquids include soda, tea, black coffee, apple or grape juice, broth.  Take these medicines the morning of surgery with A SIP OF WATER: Metoprolol, HYDROCODONE   Do not wear jewelry, make-up or nail polish.  Do not wear lotions, powders, or perfumes. You may wear deodorant.  Do not shave 48 hours prior to surgery.  Do not bring valuables to the hospital.  Contacts, dentures or bridgework may not be worn into surgery.  Leave suitcase in the car. After surgery it may be brought to your room.  For patients admitted to the hospital, checkout time is 11:00 AM the day of discharge.   Patients discharged the day of surgery will not be allowed to drive home.  Name and phone number of your driver: /w FAMILY  Special Instructions: CHG Shower Use Special Wash: 1/2 bottle night before surgery and 1/2 bottle morning of surgery.   Please read over the following fact sheets that you were given: Pain Booklet, Coughing and Deep Breathing, MRSA Information and Surgical Site Infection Prevention

## 2012-01-26 NOTE — Telephone Encounter (Signed)
Called patient per request of Dr.Ross to have Lipid panel and ast tomorrow at 345 pm prior to echo. She will eat a late breakfast and finish by 10 am.

## 2012-01-26 NOTE — Progress Notes (Signed)
LAB WORK for LIPID and AST scheduled for 3/5.

## 2012-01-27 ENCOUNTER — Other Ambulatory Visit (INDEPENDENT_AMBULATORY_CARE_PROVIDER_SITE_OTHER): Payer: Medicare Other

## 2012-01-27 ENCOUNTER — Other Ambulatory Visit: Payer: Self-pay

## 2012-01-27 ENCOUNTER — Ambulatory Visit (INDEPENDENT_AMBULATORY_CARE_PROVIDER_SITE_OTHER): Payer: Medicare Other | Admitting: Internal Medicine

## 2012-01-27 ENCOUNTER — Ambulatory Visit (HOSPITAL_BASED_OUTPATIENT_CLINIC_OR_DEPARTMENT_OTHER): Payer: Medicare Other

## 2012-01-27 ENCOUNTER — Encounter: Payer: Self-pay | Admitting: Internal Medicine

## 2012-01-27 DIAGNOSIS — R0609 Other forms of dyspnea: Secondary | ICD-10-CM

## 2012-01-27 DIAGNOSIS — S91009A Unspecified open wound, unspecified ankle, initial encounter: Secondary | ICD-10-CM

## 2012-01-27 DIAGNOSIS — R0602 Shortness of breath: Secondary | ICD-10-CM

## 2012-01-27 DIAGNOSIS — R0989 Other specified symptoms and signs involving the circulatory and respiratory systems: Secondary | ICD-10-CM

## 2012-01-27 DIAGNOSIS — I10 Essential (primary) hypertension: Secondary | ICD-10-CM

## 2012-01-27 DIAGNOSIS — Z01811 Encounter for preprocedural respiratory examination: Secondary | ICD-10-CM | POA: Insufficient documentation

## 2012-01-27 DIAGNOSIS — E785 Hyperlipidemia, unspecified: Secondary | ICD-10-CM

## 2012-01-27 DIAGNOSIS — E782 Mixed hyperlipidemia: Secondary | ICD-10-CM

## 2012-01-27 DIAGNOSIS — M1990 Unspecified osteoarthritis, unspecified site: Secondary | ICD-10-CM

## 2012-01-27 DIAGNOSIS — Z01812 Encounter for preprocedural laboratory examination: Secondary | ICD-10-CM | POA: Insufficient documentation

## 2012-01-27 DIAGNOSIS — Z01818 Encounter for other preprocedural examination: Secondary | ICD-10-CM | POA: Insufficient documentation

## 2012-01-27 DIAGNOSIS — Z951 Presence of aortocoronary bypass graft: Secondary | ICD-10-CM | POA: Insufficient documentation

## 2012-01-27 DIAGNOSIS — I2510 Atherosclerotic heart disease of native coronary artery without angina pectoris: Secondary | ICD-10-CM

## 2012-01-27 NOTE — Patient Instructions (Signed)
Clean the wound involving  Your  left leg daily and wrap  Call if you develop worsening pain redness or drainage or fever

## 2012-01-27 NOTE — Progress Notes (Signed)
  Subjective:    Patient ID: [name], female    DOB: [birth year], 78 y.o.   MRN: [id number]  HPI  78 year old patient who is seen today to evaluate a wound involving her left lower leg. This occurred 3 days ago when she traumatized the region on her walker. She has seen cardiology recently and is scheduled for a 2-D echocardiogram later this afternoon. Next week she is scheduled for a laminectomy.    Review of Systems  Skin: Positive for wound.       Objective:   Physical Exam  Constitutional:       Blood pressure 120/76  Cardiovascular: Normal rate, regular rhythm and normal heart sounds.   Pulmonary/Chest: Effort normal and breath sounds normal. No respiratory distress. She has no wheezes. She has no rales.  Musculoskeletal: She exhibits edema.       +1 peripheral edema  Skin: Rash noted.       Patient had a superficial laceration/skin tear involving her left lateral lower leg just proximal to the ankle. There is no discharge surrounding erythema and the wound looked clean          Assessment & Plan:   Laceration left lower leg. Local wound care discussed Coronary artery disease. Follow 2-D echocardiogram later this afternoon Hypertension well controlled

## 2012-01-28 LAB — LIPID PANEL
HDL: 74.1 mg/dL (ref >39.00)
Total CHOL/HDL Ratio: 2
Triglycerides: 53 mg/dL (ref 0.0–149.0)
VLDL: 10.6 mg/dL (ref 0.0–40.0)

## 2012-01-28 NOTE — Progress Notes (Signed)
Echo completed and in epic.

## 2012-01-30 ENCOUNTER — Encounter: Payer: Self-pay | Admitting: *Deleted

## 2012-02-01 MED ORDER — VANCOMYCIN HCL 500 MG IV SOLR
500.0000 mg | Freq: Once | INTRAVENOUS | Status: DC
  Filled 2012-02-01: qty 500

## 2012-02-02 ENCOUNTER — Ambulatory Visit (HOSPITAL_COMMUNITY): Admission: RE | Admit: 2012-02-02 | Payer: Medicare Other | Source: Ambulatory Visit | Admitting: Neurosurgery

## 2012-02-02 ENCOUNTER — Encounter (HOSPITAL_COMMUNITY): Admission: RE | Payer: Self-pay | Source: Ambulatory Visit

## 2012-02-02 SURGERY — LUMBAR LAMINECTOMY/DECOMPRESSION MICRODISCECTOMY
Anesthesia: General | Site: Back | Laterality: Right

## 2012-02-09 ENCOUNTER — Encounter (HOSPITAL_BASED_OUTPATIENT_CLINIC_OR_DEPARTMENT_OTHER): Payer: Medicare Other | Attending: Internal Medicine

## 2012-02-09 DIAGNOSIS — E785 Hyperlipidemia, unspecified: Secondary | ICD-10-CM | POA: Insufficient documentation

## 2012-02-09 DIAGNOSIS — M069 Rheumatoid arthritis, unspecified: Secondary | ICD-10-CM | POA: Insufficient documentation

## 2012-02-09 DIAGNOSIS — IMO0002 Reserved for concepts with insufficient information to code with codable children: Secondary | ICD-10-CM | POA: Insufficient documentation

## 2012-02-09 DIAGNOSIS — Z951 Presence of aortocoronary bypass graft: Secondary | ICD-10-CM | POA: Insufficient documentation

## 2012-02-09 DIAGNOSIS — Z79899 Other long term (current) drug therapy: Secondary | ICD-10-CM | POA: Insufficient documentation

## 2012-02-09 DIAGNOSIS — Z96659 Presence of unspecified artificial knee joint: Secondary | ICD-10-CM | POA: Insufficient documentation

## 2012-02-09 DIAGNOSIS — S8010XA Contusion of unspecified lower leg, initial encounter: Secondary | ICD-10-CM | POA: Insufficient documentation

## 2012-02-10 NOTE — Progress Notes (Signed)
[facility]  NAME:  [name], [name]               ACCOUNT NO.:  [id number]  MEDICAL RECORD NO.:  [id number]      DATE OF BIRTH:  [birth year]  PHYSICIAN:  Jonelle Sports. Paige Monarrez, M.D.  VISIT DATE:  02/09/2012                                  OFFICE VISIT   HISTORY:  This 78 year old white female is seen for evaluation of a traumatic wound on the left lower extremity.  The patient's medical history is somewhat complex in that she is immunocompromised both by rheumatoid arthritis and by the treatment thereof, which includes methotrexate and Plaquenil.  She generally has had a satisfactory recent course until she bumped her left lateral lower leg on her walker and avulsed significant piece of skin.  The details of her initial treatment are less than clear, but apparently she went on to develop a significant hematoma at the site, which persist to the present with no evidence of healing.  She has had no drainage, no particular odor.  She does have some chronic pain in the area.  She has had no fever or systemic symptoms, and denies any unusual swelling or redness in the area surrounding the wound.  She has been placed on clindamycin elsewhere and is sent here for our evaluation.  The patient is apparently awaiting a decompression of severe L5 and S1 nerve root impingement on the left with the hopes that this wound can be healed up in advance of any such surgery.  PAST MEDICAL HISTORY:  Surgeries include C-section in 1968, coronary artery bypass grafting with management of a small corner artery aneurysm at that time in 2009, right knee total replacement in 2010, C4-C5 diskectomy in 2011, left knee replacement in 2012.  She has had no hospitalizations except in association with those problems.  She is said to be allergic to PENICILLIN, TETANUS and AMPICILLIN.  MEDICATIONS: 1. Daily multiple vitamin. 2. Recently clindamycin 300 mg b.i.d. 3. Regularly simvastatin 40 mg  daily. 4. Metoprolol 50 mg daily. 5. Folic acid 1 mg b.i.d. 6. Methotrexate 2 mg tablets, taken 6 tablets once weekly. 7. Hydroxyzine 25 mg every other day. 8. Plaquenil 200 mg b.i.d. 9. Hydrocodone/acetaminophen 5/325 every 6 hours as needed. 10.Hydrochlorothiazide 25 mg daily. 11.Cetirizine hydrochloride 10 mg every 2-3 days as needed for     allergic symptoms. 12.Baby aspirin once daily.  PERSONAL HISTORY:  The patient apparently is divorced or widowed, I am not clear which, but is cared for primarily by son who lives locally. She has not gainful employment.  She denies use of tobacco or alcohol.  FAMILY HISTORY:  With regards to family history, apparently was heart disease in her mother and her father had a stroke.  REVIEW OF SYSTEMS:  The patient speaks of hypertension of her neuropathies related to her spinal disease.  Her osteoarthritis, which have been present for years and rheumatoid arthritis, which has now been present for some 2-3 years.  She has also had trouble with carpal tunnel syndrome in the past and hyperlipidemia.  PHYSICAL EXAMINATION:  Blood pressure is 109/72, pulse is 69 and regular, respirations 18, temperature 97.7.  She is an alert, cooperative, white female appearing her stated age.  She has a carpal tunnel-type splint on her right upper extremity at the time of  this examination.  Her skin is warm and dry.  She has no cyanosis, icterus, petechiae or purpura.  Head, eyes, ears, nose, throat show no gross external abnormalities, are not examined in detail.  Her chest is clear to auscultation and percussion.  Her heart is regular and with what appear to be essentially normal tones.  Her abdomen is slightly obese, but nontender.  Her extremities show no edema.  All distal pulses are palpable.  On her left lower extremity, on the lateral aspect of her left calf is a wound measuring 4.0 x 3.2 cm and covered entirely with a dark eschar.  There is no  surrounding erythema, no drainage, and no odor.  IMPRESSION:  Traumatic wound, left lateral tibial area.  This occurring in an immunocompromised host.  DISPOSITION:  The wound is debrided.  I thought initially this might be a fairly shallow eschar, but in fact it covers a hematoma, which is several millimeters in depth and which dissects back under the medial skin flap of this wound.  It shows no signs of organization.  This is debrided away to the extent I can comfortably achieve it.  Once this was done, the wound is treated with an application of Santyl and Medihoney covered with absorptive pads and extremity placed in a Kerlix, Coban wrap.  The patient will return here in 3 days for dressing change and will return in 1 week to see the physician.  She will report any other problems that might occur immediately.  She was advised to continue her clindamycin until it is completed, which I think will be essentially 1 more day.          ______________________________ Jonelle Sports. Cheryll Cockayne, M.D.     RES/MEDQ  D:  02/09/2012  T:  02/10/2012  Job:  [id number]

## 2012-02-12 ENCOUNTER — Emergency Department (HOSPITAL_COMMUNITY)
Admission: EM | Admit: 2012-02-12 | Discharge: 2012-02-12 | Disposition: A | Discharge status: Home / Self Care | Payer: Medicare Other | Attending: Emergency Medicine | Admitting: Emergency Medicine

## 2012-02-12 ENCOUNTER — Emergency Department (HOSPITAL_COMMUNITY): Payer: Medicare Other

## 2012-02-12 ENCOUNTER — Encounter (HOSPITAL_COMMUNITY): Payer: Self-pay

## 2012-02-12 DIAGNOSIS — K589 Irritable bowel syndrome without diarrhea: Secondary | ICD-10-CM | POA: Insufficient documentation

## 2012-02-12 DIAGNOSIS — I2510 Atherosclerotic heart disease of native coronary artery without angina pectoris: Secondary | ICD-10-CM | POA: Insufficient documentation

## 2012-02-12 DIAGNOSIS — K219 Gastro-esophageal reflux disease without esophagitis: Secondary | ICD-10-CM | POA: Insufficient documentation

## 2012-02-12 DIAGNOSIS — Z7982 Long term (current) use of aspirin: Secondary | ICD-10-CM | POA: Insufficient documentation

## 2012-02-12 DIAGNOSIS — S0101XA Laceration without foreign body of scalp, initial encounter: Secondary | ICD-10-CM

## 2012-02-12 DIAGNOSIS — F3289 Other specified depressive episodes: Secondary | ICD-10-CM | POA: Insufficient documentation

## 2012-02-12 DIAGNOSIS — S0990XA Unspecified injury of head, initial encounter: Secondary | ICD-10-CM

## 2012-02-12 DIAGNOSIS — I10 Essential (primary) hypertension: Secondary | ICD-10-CM | POA: Insufficient documentation

## 2012-02-12 DIAGNOSIS — W1809XA Striking against other object with subsequent fall, initial encounter: Secondary | ICD-10-CM | POA: Insufficient documentation

## 2012-02-12 DIAGNOSIS — F329 Major depressive disorder, single episode, unspecified: Secondary | ICD-10-CM | POA: Insufficient documentation

## 2012-02-12 DIAGNOSIS — Z79899 Other long term (current) drug therapy: Secondary | ICD-10-CM | POA: Insufficient documentation

## 2012-02-12 DIAGNOSIS — M069 Rheumatoid arthritis, unspecified: Secondary | ICD-10-CM | POA: Insufficient documentation

## 2012-02-12 DIAGNOSIS — W19XXXA Unspecified fall, initial encounter: Secondary | ICD-10-CM

## 2012-02-12 DIAGNOSIS — M1990 Unspecified osteoarthritis, unspecified site: Secondary | ICD-10-CM | POA: Insufficient documentation

## 2012-02-12 DIAGNOSIS — S0100XA Unspecified open wound of scalp, initial encounter: Secondary | ICD-10-CM | POA: Insufficient documentation

## 2012-02-12 DIAGNOSIS — E782 Mixed hyperlipidemia: Secondary | ICD-10-CM | POA: Insufficient documentation

## 2012-02-12 NOTE — Discharge Instructions (Signed)
Concussion and Brain Injury A blow or jolt to the head can disrupt the normal function of the brain. This type of brain injury is often called a "concussion" or a "closed head injury." Concussions are usually not life-threatening. Even so, the effects of a concussion can be serious.  CAUSES  A concussion is caused by a blunt blow to the head. The blow might be direct or indirect as described below.  Direct blow (running into another player during a soccer game, being hit in a fight, or hitting your head on a hard surface).   Indirect blow (when your head moves rapidly and violently back and forth like in a car crash).  SYMPTOMS  The brain is very complex. Every head injury is different. Some symptoms may appear right away. Other symptoms may not show up for days or weeks after the concussion. The signs of concussion can be hard to notice. Early on, problems may be missed by patients, family members, and caregivers. You may look fine even though you are acting or feeling differently.  These symptoms are usually temporary, but may last for days, weeks, or even longer. Symptoms include:  Mild headaches that will not go away.   Having more trouble than usual with:   Remembering things.   Paying attention or concentrating.   Organizing daily tasks.   Making decisions and solving problems.   Slowness in thinking, acting, speaking, or reading.   Getting lost or easily confused.   Feeling tired all the time or lacking energy (fatigue).   Feeling drowsy.   Sleep disturbances.   Sleeping more than usual.   Sleeping less than usual.   Trouble falling asleep.   Trouble sleeping (insomnia).   Loss of balance or feeling lightheaded or dizzy.   Nausea or vomiting.   Numbness or tingling.   Increased sensitivity to:   Sounds.   Lights.   Distractions.  Other symptoms might include:  Vision problems or eyes that tire easily.   Diminished sense of taste or smell.   Ringing  in the ears.   Mood changes such as feeling sad, anxious, or listless.   Becoming easily irritated or angry for little or no reason.   Lack of motivation.  DIAGNOSIS  Your caregiver can usually diagnose a concussion or mild brain injury based on your description of your injury and your symptoms.  Your evaluation might include:  A brain scan to look for signs of injury to the brain. Even if the test shows no injury, you may still have a concussion.   Blood tests to be sure other problems are not present.  TREATMENT   People with a concussion need to be examined and evaluated. Most people with concussions are treated in an emergency department, urgent care, or clinic. Some people must stay in the hospital overnight for further treatment.   Your caregiver will send you home with important instructions to follow. Be sure to carefully follow them.   Tell your caregiver if you are already taking any medicines (prescription, over-the-counter, or natural remedies), or if you are drinking alcohol or taking illegal drugs. Also, talk with your caregiver if you are taking blood thinners (anticoagulants) or aspirin. These drugs may increase your chances of complications. All of this is important information that may affect treatment.   Only take over-the-counter or prescription medicines for pain, discomfort, or fever as directed by your caregiver.  PROGNOSIS  How fast people recover from brain injury varies from person to person.   Although most people have a good recovery, how quickly they improve depends on many factors. These factors include how severe their concussion was, what part of the brain was injured, their age, and how healthy they were before the concussion.  Because all head injuries are different, so is recovery. Most people with mild injuries recover fully. Recovery can take time. In general, recovery is slower in older persons. Also, persons who have had a concussion in the past or have  other medical problems may find that it takes longer to recover from their current injury. Anxiety and depression may also make it harder to adjust to the symptoms of brain injury. HOME CARE INSTRUCTIONS  Return to your normal activities slowly, not all at once. You must give your body and brain enough time for recovery.  Get plenty of sleep at night, and rest during the day. Rest helps the brain to heal.   Avoid staying up late at night.   Keep the same bedtime hours on weekends and weekdays.   Take daytime naps or rest breaks when you feel tired.   Limit activities that require a lot of thought or concentration (brain or cognitive rest). This includes:   Homework or job-related work.   Watching TV.   Computer work.   Avoid activities that could lead to a second brain injury, such as contact or recreational sports, until your caregiver says it is okay. Even after your brain injury has healed, you should protect yourself from having another concussion.   Ask your caregiver when you can return to your normal activities such as driving, bicycling, or operating heavy equipment. Your ability to react may be slower after a brain injury.   Talk with your caregiver about when you can return to work or school.   Inform your teachers, school nurse, school counselor, coach, athletic trainer, or work manager about your injury, symptoms, and restrictions. They should be instructed to report:   Increased problems with attention or concentration.   Increased problems remembering or learning new information.   Increased time needed to complete tasks or assignments.   Increased irritability or decreased ability to cope with stress.   Increased symptoms.   Take only those medicines that your caregiver has approved.   Do not drink alcohol until your caregiver says you are well enough to do so. Alcohol and certain other drugs may slow your recovery and can put you at risk of further injury.    If it is harder than usual to remember things, write them down.   If you are easily distracted, try to do one thing at a time. For example, do not try to watch TV while fixing dinner.   Talk with family members or close friends when making important decisions.   Keep all follow-up appointments. Repeated evaluation of your symptoms is recommended for your recovery.  PREVENTION  Protect your head from future injury. It is very important to avoid another head or brain injury before you have recovered. In rare cases, another injury has lead to permanent brain damage, brain swelling, or death. Avoid injuries by using:  Seatbelts when riding in a car.   Alcohol only in moderation.   A helmet when biking, skiing, skateboarding, skating, or doing similar activities.   Safety measures in your home.   Remove clutter and tripping hazards from floors and stairways.   Use grab bars in bathrooms and handrails by stairs.   Place non-slip mats on floors and in bathtubs.     Improve lighting in dim areas.  SEEK MEDICAL CARE IF:  A head injury can cause lingering symptoms. You should seek medical care if you have any of the following symptoms for more than 3 weeks after your injury or are planning to return to sports:  Chronic headaches.   Dizziness or balance problems.   Nausea.   Vision problems.   Increased sensitivity to noise or light.   Depression or mood swings.   Anxiety or irritability.   Memory problems.   Difficulty concentrating or paying attention.   Sleep problems.   Feeling tired all the time.  SEEK IMMEDIATE MEDICAL CARE IF:  You have had a blow or jolt to the head and you (or your family or friends) notice:  Severe or worsening headaches.   Weakness (even if only in one hand or one leg or one part of the face), numbness, or decreased coordination.   Repeated vomiting.   Increased sleepiness or passing out.   One black center of the eye (pupil) is larger  than the other.   Convulsions (seizures).   Slurred speech.   Increasing confusion, restlessness, agitation, or irritability.   Lack of ability to recognize people or places.   Neck pain.   Difficulty being awakened.   Unusual behavior changes.   Loss of consciousness.  Older adults with a brain injury may have a higher risk of serious complications such as a blood clot on the brain. Headaches that get worse or an increase in confusion are signs of this complication. If these signs occur, see a caregiver right away. MAKE SURE YOU:   Understand these instructions.   Will watch your condition.   Will get help right away if you are not doing well or get worse.  FOR MORE INFORMATION  Several groups help people with brain injury and their families. They provide information and put people in touch with local resources. These include support groups, rehabilitation services, and a variety of health care professionals. Among these groups, the Brain Injury Association (BIA, www.biausa.org) has a national office that gathers scientific and educational information and works on a national level to help people with brain injury.  Document Released: 01/31/2004 Document Revised: 10/30/2011 Document Reviewed: 06/28/2008 [employer] Patient Information 2012 [employer]. 

## 2012-02-12 NOTE — ED Notes (Signed)
Pt lost her balance and fell backwards, she has a large hematoma on the back of her head and a laceration

## 2012-02-12 NOTE — ED Provider Notes (Signed)
History     CSN: [id number]  Arrival date & time 02/12/12  0534   First MD Initiated Contact with Patient 02/12/12 [phone]      Chief Complaint  Patient presents with  . Head Laceration    (Consider location/radiation/quality/duration/timing/severity/associated sxs/prior treatment) HPI Comments: Larey Seat and hit head on corner of table.  No loc.  She has a small laceration that she can't get to stop bleeding.  Patient is a 78 y.o. female presenting with scalp laceration. The history is provided by the patient.  Head Laceration This is a new problem. The current episode started less than 1 hour ago. The problem occurs constantly. The problem has not changed since onset.   Past Medical History  Diagnosis Date  . CORONARY ATHEROSCLEROSIS, ARTERY BYPASS GRAFT 08/23/2008  . DEPRESSION 05/21/2007  . FIBROCYSTIC BREAST DISEASE, HX OF 05/21/2007  . HYPERLIPIDEMIA-MIXED 11/26/2008  . Irritable bowel syndrome 05/21/2007  . KNEE PAIN 09/16/2007  . LEG EDEMA, BILATERAL 05/21/2009  . OSTEOARTHRITIS 01/20/2008  . OSTEOPENIA 05/21/2007  . URTICARIA, CHRONIC 05/21/2007  . DJD (degenerative joint disease)     history of   . History of aortic aneurysm     resection and grafting of a 5.2-cm ascending aortic arch aneurysm August 2009  . Dyslipidemia   . Vitamin d deficiency   . Urticaria   . Rheumatoid arthritis   . Neuropathy     left radial  neuropathy with wrist drop  . CAD (coronary artery disease)     s/p CABG -  x 1 with LIMA to LAD August 2009 /   . Diverticulosis   . Irritable bowel syndrome   . GERD (gastroesophageal reflux disease)   . HYPERTENSION 05/11/2008    seen on 01/23/2012, by Dr. Tenny Craw, will have ECHO- 01/27/2012  . 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 Vaginal yeast infection     recently told to use monistat, hasn't started as of 01/26/2012    Past Surgical History  Procedure Date  . Appendectomy   . Cesarean section   . Hernia repair   . Coronary artery bypass graft  August 2009    CABG x 1 with LIMA to LAD /  . Ascending aortic aneurysm repair August 2009     resection and grafting of a 5.2-cm ascending aortic arch aneurysm  . Joint replacement     2009 & 2010- both knees  . Back surgery     2011- cerv. fusion   . Tonsillectomy     as a child  . Breast surgery     biopsy x2- R breast    Family History  Problem Relation Age of Onset  . Heart disease    . Coronary artery disease    . Anesthesia problems Neg Hx   . Hypotension Neg Hx   . Malignant hyperthermia Neg Hx   . Pseudochol deficiency Neg Hx     History  Substance Use Topics  . Smoking status: Never Smoker   . Smokeless tobacco: Not on file  . Alcohol Use: No    OB History    Grav Para Term Preterm Abortions TAB SAB Ect Mult Living                  Review of Systems  All other systems reviewed and are negative.    Allergies  Amoxicillin; Cyclobenzaprine hcl; Penicillins; and Tetanus toxoid  Home Medications   Current Outpatient Rx  Name Route Sig  Dispense Refill  . ASPIRIN 81 MG PO TABS Oral Take 81 mg by mouth daily.      Marland Kitchen CALCIUM CARBONATE ANTACID 500 MG PO CHEW Oral Chew 2 tablets by mouth at bedtime.    Marland Kitchen CETIRIZINE HCL 10 MG PO TABS Oral Take 10 mg by mouth daily as needed. Alternating with Hydroxyzine  For allergies    . FOLIC ACID 1 MG PO TABS Oral Take 1 mg by mouth 2 (two) times daily.      . FUROSEMIDE 40 MG PO TABS Oral Take 40 mg by mouth 2 (two) times a week. AS NEEDED  For fluid retension    . HYDROCHLOROTHIAZIDE 25 MG PO TABS Oral Take 25 mg by mouth daily.    Marland Kitchen HYDROCODONE-ACETAMINOPHEN 5-325 MG PO TABS  1-2 tablets every 6 (six) hours as needed. For pain    . HYDROXYCHLOROQUINE SULFATE 200 MG PO TABS Oral Take 200 mg by mouth 2 (two) times daily.     Marland Kitchen HYDROXYZINE HCL 25 MG PO TABS Oral Take 25 mg by mouth every 4 (four) hours as needed. For allergies alternate with Zyrtec    . METHOTREXATE SODIUM 2.5 MG PO TABS Oral Take 15 mg by mouth once a  week. every Thursday    . METOPROLOL SUCCINATE ER 50 MG PO TB24 Oral Take 50 mg by mouth daily.     Marland Kitchen ONE-DAILY MULTI VITAMINS PO TABS Oral Take 1 tablet by mouth daily.     Marland Kitchen POTASSIUM CHLORIDE CRYS ER 20 MEQ PO TBCR Oral Take 20 mEq by mouth 2 (two) times a week. As needed with Furosemide    . SIMVASTATIN 40 MG PO TABS Oral Take 40 mg by mouth at bedtime.       BP 165/59  Pulse 76  Temp(Src) 98.6 F (37 C) (Oral)  Resp 18  SpO2 100%  Physical Exam  Nursing note and vitals reviewed. Constitutional: She is oriented to person, place, and time. She appears well-developed and well-nourished. No distress.  HENT:  Head: Normocephalic.       There is a 2.5 cm gaping laceration to the scalp near the mastoid with arterial bleeding present.    Eyes: EOM are normal. Pupils are equal, round, and reactive to light.  Neck: Normal range of motion. Neck supple.  Musculoskeletal: Normal range of motion.  Neurological: She is alert and oriented to person, place, and time. No cranial nerve deficit.  Skin: Skin is warm and dry.    ED Course  Procedures (including critical care time)  Labs Reviewed - No data to display No results found.   No diagnosis found.  LACERATION REPAIR Performed by: Geoffery Lyons Authorized by: Geoffery Lyons Consent: Verbal consent obtained. Risks and benefits: risks, benefits and alternatives were discussed Consent given by: patient Patient identity confirmed: provided demographic data Prepped and Draped in normal sterile fashion Wound explored  Laceration Location: posterior scalp  Laceration Length: 2.5cm  No Foreign Bodies seen or palpated  Anesthesia: local infiltration  Local anesthetic: lidocaine 1% with epinephrine  Anesthetic total: 3 ml  Irrigation method: syringe Amount of cleaning: standard  Skin closure: staples  Number of sutures: 4  Technique: staples  Patient tolerance: Patient tolerated the procedure well with no immediate  complications.   MDM  Ct looks good.  Bleeding controlled.  Will discharge to home, return prn.        Geoffery Lyons, MD 02/12/12 [phone]

## 2012-02-19 ENCOUNTER — Ambulatory Visit (INDEPENDENT_AMBULATORY_CARE_PROVIDER_SITE_OTHER): Payer: Medicare Other | Admitting: Internal Medicine

## 2012-02-19 ENCOUNTER — Encounter: Payer: Self-pay | Admitting: Internal Medicine

## 2012-02-19 VITALS — BP 124/70 | Temp 98.1°F

## 2012-02-19 DIAGNOSIS — R2681 Unsteadiness on feet: Secondary | ICD-10-CM

## 2012-02-19 DIAGNOSIS — R269 Unspecified abnormalities of gait and mobility: Secondary | ICD-10-CM

## 2012-02-19 DIAGNOSIS — Z4802 Encounter for removal of sutures: Secondary | ICD-10-CM

## 2012-02-19 DIAGNOSIS — I10 Essential (primary) hypertension: Secondary | ICD-10-CM

## 2012-02-19 NOTE — Patient Instructions (Signed)
Call or return to clinic prn if these symptoms worsen or fail to improve as anticipated.

## 2012-02-19 NOTE — Progress Notes (Signed)
  Subjective:    Patient ID: [name], female    DOB: [birth year], 78 y.o.   MRN: [id number]  HPI  79 -year-old patient who is seen today for removal of 4 staples involving her occipital scalp area she has a history of unsteady gait and frequent falls.    Review of Systems     Objective:   Physical Exam  4 staples noted in the occipital scalp area      Assessment & Plan:   Staple removal. 4 staples removed without difficulty incision was clean and appeared to be healing nicely

## 2012-02-23 ENCOUNTER — Encounter (HOSPITAL_BASED_OUTPATIENT_CLINIC_OR_DEPARTMENT_OTHER): Payer: Medicare Other | Attending: Internal Medicine

## 2012-02-23 DIAGNOSIS — L97209 Non-pressure chronic ulcer of unspecified calf with unspecified severity: Secondary | ICD-10-CM | POA: Insufficient documentation

## 2012-03-08 ENCOUNTER — Encounter (HOSPITAL_BASED_OUTPATIENT_CLINIC_OR_DEPARTMENT_OTHER): Payer: Medicare Other

## 2012-03-29 ENCOUNTER — Encounter (HOSPITAL_BASED_OUTPATIENT_CLINIC_OR_DEPARTMENT_OTHER): Payer: Medicare Other | Attending: Internal Medicine

## 2012-03-29 DIAGNOSIS — R609 Edema, unspecified: Secondary | ICD-10-CM | POA: Insufficient documentation

## 2012-03-29 DIAGNOSIS — L97809 Non-pressure chronic ulcer of other part of unspecified lower leg with unspecified severity: Secondary | ICD-10-CM | POA: Insufficient documentation

## 2012-04-26 ENCOUNTER — Encounter (HOSPITAL_BASED_OUTPATIENT_CLINIC_OR_DEPARTMENT_OTHER): Payer: Medicare Other

## 2012-04-28 ENCOUNTER — Encounter (HOSPITAL_BASED_OUTPATIENT_CLINIC_OR_DEPARTMENT_OTHER): Payer: Medicare Other | Attending: General Surgery

## 2012-04-28 DIAGNOSIS — Z96659 Presence of unspecified artificial knee joint: Secondary | ICD-10-CM | POA: Insufficient documentation

## 2012-04-28 DIAGNOSIS — I872 Venous insufficiency (chronic) (peripheral): Secondary | ICD-10-CM | POA: Insufficient documentation

## 2012-04-28 DIAGNOSIS — E785 Hyperlipidemia, unspecified: Secondary | ICD-10-CM | POA: Insufficient documentation

## 2012-04-28 DIAGNOSIS — Z79899 Other long term (current) drug therapy: Secondary | ICD-10-CM | POA: Insufficient documentation

## 2012-04-28 DIAGNOSIS — L97809 Non-pressure chronic ulcer of other part of unspecified lower leg with unspecified severity: Secondary | ICD-10-CM | POA: Insufficient documentation

## 2012-04-28 DIAGNOSIS — I10 Essential (primary) hypertension: Secondary | ICD-10-CM | POA: Insufficient documentation

## 2012-04-30 ENCOUNTER — Ambulatory Visit: Payer: Medicare Other | Admitting: Internal Medicine

## 2012-05-03 ENCOUNTER — Ambulatory Visit (INDEPENDENT_AMBULATORY_CARE_PROVIDER_SITE_OTHER): Payer: Medicare Other | Admitting: Internal Medicine

## 2012-05-03 ENCOUNTER — Encounter: Payer: Self-pay | Admitting: Internal Medicine

## 2012-05-03 VITALS — BP 138/74 | Wt 172.0 lb

## 2012-05-03 DIAGNOSIS — L609 Nail disorder, unspecified: Secondary | ICD-10-CM

## 2012-05-03 DIAGNOSIS — R2681 Unsteadiness on feet: Secondary | ICD-10-CM

## 2012-05-03 DIAGNOSIS — R269 Unspecified abnormalities of gait and mobility: Secondary | ICD-10-CM

## 2012-05-03 DIAGNOSIS — I10 Essential (primary) hypertension: Secondary | ICD-10-CM

## 2012-05-03 DIAGNOSIS — M1990 Unspecified osteoarthritis, unspecified site: Secondary | ICD-10-CM

## 2012-05-03 DIAGNOSIS — E785 Hyperlipidemia, unspecified: Secondary | ICD-10-CM

## 2012-05-03 DIAGNOSIS — I2510 Atherosclerotic heart disease of native coronary artery without angina pectoris: Secondary | ICD-10-CM

## 2012-05-03 NOTE — Progress Notes (Signed)
  Subjective:    Patient ID: [name], female    DOB: 31-Mar-1932, 78 y.o.   MRN: [id number]  HPI  78 year old patient who is seen today for her six-month followup. She is followed by multiple consultants including neurology rheumatology orthopedics and cardiology. Her most pressing problem is gait instability. She continues to have frequent falls. She has dyslipidemia and coronary artery disease. She does have a history of aortic aneurysm repair. No cardiopulmonary complaints. She continues to have low back pain. She has treated hypertension which has been stable. At the present time she is being followed at the wound clinic.    Review of Systems  Constitutional: Negative.   HENT: Negative for hearing loss, congestion, sore throat, rhinorrhea, dental problem, sinus pressure and tinnitus.   Eyes: Negative for pain, discharge and visual disturbance.  Respiratory: Negative for cough and shortness of breath.   Cardiovascular: Negative for chest pain, palpitations and leg swelling.  Gastrointestinal: Negative for nausea, vomiting, abdominal pain, diarrhea, constipation, blood in stool and abdominal distention.  Genitourinary: Negative for dysuria, urgency, frequency, hematuria, flank pain, vaginal bleeding, vaginal discharge, difficulty urinating, vaginal pain and pelvic pain.  Musculoskeletal: Positive for back pain, arthralgias and gait problem. Negative for joint swelling.  Skin: Positive for wound. Negative for rash.  Neurological: Negative for dizziness, syncope, speech difficulty, weakness, numbness and headaches.  Hematological: Negative for adenopathy.  Psychiatric/Behavioral: Negative for behavioral problems, dysphoric mood and agitation. The patient is not nervous/anxious.        Objective:   Physical Exam  Constitutional: She is oriented to person, place, and time. She appears well-developed and well-nourished.  HENT:  Head: Normocephalic.  Right Ear: External ear normal.    Left Ear: External ear normal.  Mouth/Throat: Oropharynx is clear and moist.  Eyes: Conjunctivae and EOM are normal. Pupils are equal, round, and reactive to light.  Neck: Normal range of motion. Neck supple. No thyromegaly present.  Cardiovascular: Normal rate, regular rhythm, normal heart sounds and intact distal pulses.   Pulmonary/Chest: Effort normal and breath sounds normal.  Abdominal: Soft. Bowel sounds are normal. She exhibits no mass. There is no tenderness.  Musculoskeletal: Normal range of motion.  Lymphadenopathy:    She has no cervical adenopathy.  Neurological: She is alert and oriented to person, place, and time.  Skin: Skin is warm and dry. Rash noted.  Psychiatric: She has a normal mood and affect. Her behavior is normal.          Assessment & Plan:   Hypertension well controlled Coronary artery disease stable Osteoarthritis with chronic low back pain Gait instability.  Recheck 6 months

## 2012-05-03 NOTE — Patient Instructions (Signed)
Limit your sodium (Salt) intake    It is important that you exercise regularly, at least 20 minutes 3 to 4 times per week.  If you develop chest pain or shortness of breath seek  medical attention.  WITH WALKER  Return in 6 months for follow-up

## 2012-05-05 ENCOUNTER — Other Ambulatory Visit: Payer: Self-pay | Admitting: Internal Medicine

## 2012-05-06 ENCOUNTER — Other Ambulatory Visit: Payer: Self-pay

## 2012-05-06 MED ORDER — HYDROCODONE-ACETAMINOPHEN 10-650 MG PO TABS: 1.0000 | ORAL_TABLET | Freq: Four times a day (QID) | ORAL | Status: AC | PRN

## 2012-05-06 NOTE — Telephone Encounter (Signed)
Ok to RF? 

## 2012-05-06 NOTE — Telephone Encounter (Signed)
Fax refill request for lorcet [phone] 1 po q6hr prn Last seen 05/03/12 - 6 mos rov  Last written 01/06/12 # 50 3RF Please advise  - this is in history - but norco in on current med list

## 2012-05-25 ENCOUNTER — Encounter (HOSPITAL_BASED_OUTPATIENT_CLINIC_OR_DEPARTMENT_OTHER): Payer: Medicare Other | Attending: General Surgery

## 2012-05-25 DIAGNOSIS — I872 Venous insufficiency (chronic) (peripheral): Secondary | ICD-10-CM | POA: Insufficient documentation

## 2012-05-25 DIAGNOSIS — S81009A Unspecified open wound, unspecified knee, initial encounter: Secondary | ICD-10-CM | POA: Insufficient documentation

## 2012-05-25 DIAGNOSIS — X58XXXA Exposure to other specified factors, initial encounter: Secondary | ICD-10-CM | POA: Insufficient documentation

## 2012-05-25 DIAGNOSIS — L97809 Non-pressure chronic ulcer of other part of unspecified lower leg with unspecified severity: Secondary | ICD-10-CM | POA: Insufficient documentation

## 2012-06-24 ENCOUNTER — Other Ambulatory Visit: Payer: Self-pay | Admitting: Internal Medicine

## 2012-06-29 ENCOUNTER — Encounter (HOSPITAL_BASED_OUTPATIENT_CLINIC_OR_DEPARTMENT_OTHER): Payer: Medicare Other

## 2012-06-30 ENCOUNTER — Encounter (HOSPITAL_BASED_OUTPATIENT_CLINIC_OR_DEPARTMENT_OTHER): Payer: Medicare Other | Attending: General Surgery

## 2012-06-30 DIAGNOSIS — L97809 Non-pressure chronic ulcer of other part of unspecified lower leg with unspecified severity: Secondary | ICD-10-CM | POA: Insufficient documentation

## 2012-07-23 ENCOUNTER — Other Ambulatory Visit: Payer: Self-pay | Admitting: Internal Medicine

## 2012-08-10 ENCOUNTER — Other Ambulatory Visit: Payer: Self-pay

## 2012-08-10 MED ORDER — HYDROCODONE-ACETAMINOPHEN 10-650 MG PO TABS: 1.0000 | ORAL_TABLET | Freq: Four times a day (QID) | ORAL | Status: DC | PRN

## 2012-08-10 NOTE — Telephone Encounter (Signed)
Fax refill request from [facility] for lorcet [phone] Last seen 05/03/12  6 mos rov Last written 01/06/12 # 50 3RF Please advise

## 2012-08-10 NOTE — Telephone Encounter (Signed)
called in 

## 2012-08-10 NOTE — Telephone Encounter (Signed)
ok 

## 2012-09-01 ENCOUNTER — Other Ambulatory Visit: Payer: Self-pay | Admitting: Internal Medicine

## 2012-10-11 ENCOUNTER — Encounter: Payer: Self-pay | Admitting: Internal Medicine

## 2012-10-11 ENCOUNTER — Ambulatory Visit (INDEPENDENT_AMBULATORY_CARE_PROVIDER_SITE_OTHER): Payer: Medicare Other | Admitting: Internal Medicine

## 2012-10-11 VITALS — BP 140/67 | HR 72 | Resp 18 | Ht 63.0 in | Wt 177.0 lb

## 2012-10-11 DIAGNOSIS — R5383 Other fatigue: Secondary | ICD-10-CM

## 2012-10-11 DIAGNOSIS — I25810 Atherosclerosis of coronary artery bypass graft(s) without angina pectoris: Secondary | ICD-10-CM

## 2012-10-11 DIAGNOSIS — R5381 Other malaise: Secondary | ICD-10-CM

## 2012-10-11 DIAGNOSIS — R0602 Shortness of breath: Secondary | ICD-10-CM

## 2012-10-11 NOTE — Progress Notes (Signed)
HPI Patient is a 78 yo with a hsitory of CAD and aortic aneurysm (s/p CABG and repair in Aug 2009)  Also a history of DJD, HTN, RA and HL.  I saw her in March of this year.  Patient says that she feels more sOB than she has   At night in bed a little more SOB  Deneis CP Lives alone.  No recent fall  L Leg still recovering.  Allergies  Allergen Reactions  . Amoxicillin     REACTION: unspecified  . Cyclobenzaprine Hcl     REACTION: rash  . Penicillins Hives  . Tetanus Toxoid Hives    Current Outpatient Prescriptions  Medication Sig Dispense Refill  . aspirin 81 MG tablet Take 81 mg by mouth daily.        . calcium carbonate (TUMS - DOSED IN MG ELEMENTAL CALCIUM) 500 MG chewable tablet Chew 2 tablets by mouth at bedtime.      . cetirizine (ZYRTEC) 10 MG tablet Take 10 mg by mouth daily as needed. Alternating with Hydroxyzine  For allergies      . folic acid (FOLVITE) 1 MG tablet Take 1 mg by mouth 2 (two) times daily.        . furosemide (LASIX) 40 MG tablet Take 40 mg by mouth 2 (two) times a week. AS NEEDED  For fluid retension      . hydrochlorothiazide (HYDRODIURIL) 25 MG tablet Take 25 mg by mouth daily.      Marland Kitchen HYDROcodone-acetaminophen (LORCET 10/650) 10-650 MG per tablet Take 1 tablet by mouth every 6 (six) hours as needed for pain.  50 tablet  3  . hydroxychloroquine (PLAQUENIL) 200 MG tablet Take 200 mg by mouth 2 (two) times daily.       . hydrOXYzine (ATARAX/VISTARIL) 25 MG tablet Take 25 mg by mouth every 4 (four) hours as needed. For allergies alternate with Zyrtec      . metoprolol succinate (TOPROL-XL) 50 MG 24 hr tablet Take 50 mg by mouth daily.       . potassium chloride SA (KLOR-CON M20) 20 MEQ tablet Take 20 mEq by mouth 2 (two) times a week. As needed with Furosemide      . simvastatin (ZOCOR) 40 MG tablet Take 40 mg by mouth at bedtime.       . [DISCONTINUED] hydrochlorothiazide (HYDRODIURIL) 25 MG tablet TAKE 1 TABLET BY MOUTH EVERY MORNING  90 tablet  2  .  [DISCONTINUED] metoprolol succinate (TOPROL-XL) 50 MG 24 hr tablet TAKE 1 TABLET BY MOUTH EVERY DAY  30 tablet  11  . [DISCONTINUED] simvastatin (ZOCOR) 80 MG tablet TAKE 1/2 TABLET EVERY DAY  45 tablet  3    Past Medical History  Diagnosis Date  . CORONARY ATHEROSCLEROSIS, ARTERY BYPASS GRAFT 08/23/2008  . DEPRESSION 05/21/2007  . FIBROCYSTIC BREAST DISEASE, HX OF 05/21/2007  . HYPERLIPIDEMIA-MIXED 11/26/2008  . Irritable bowel syndrome 05/21/2007  . KNEE PAIN 09/16/2007  . LEG EDEMA, BILATERAL 05/21/2009  . OSTEOARTHRITIS 01/20/2008  . OSTEOPENIA 05/21/2007  . URTICARIA, CHRONIC 05/21/2007  . DJD (degenerative joint disease)     history of   . History of aortic aneurysm     resection and grafting of a 5.2-cm ascending aortic arch aneurysm August 2009  . Dyslipidemia   . Vitamin D deficiency   . Urticaria   . Rheumatoid arthritis   . Neuropathy     left radial  neuropathy with wrist drop  . CAD (coronary artery disease)  s/p CABG -  x 1 with LIMA to LAD August 2009 /   . Diverticulosis   . Irritable bowel syndrome   . GERD (gastroesophageal reflux disease)   . HYPERTENSION 05/11/2008    seen on 01/23/2012, by Dr. Tenny Craw, will have ECHO- 01/27/2012  . 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 Vaginal yeast infection     recently told to use monistat, hasn't started as of 01/26/2012    Past Surgical History  Procedure Date  . Appendectomy   . Cesarean section   . Hernia repair   . Coronary artery bypass graft August 2009    CABG x 1 with LIMA to LAD /  . Ascending aortic aneurysm repair August 2009     resection and grafting of a 5.2-cm ascending aortic arch aneurysm  . Joint replacement     2009 & 2010- both knees  . Back surgery     2011- cerv. fusion   . Tonsillectomy     as a child  . Breast surgery     biopsy x2- R breast    Family History  Problem Relation Age of Onset  . Heart disease    . Coronary artery disease    . Anesthesia problems Neg Hx   .  Hypotension Neg Hx   . Malignant hyperthermia Neg Hx   . Pseudochol deficiency Neg Hx     History   Social History  . Marital Status: Widowed    Spouse Name: N/A    Number of Children: N/A  . Years of Education: N/A   Occupational History  . Not on file.   Social History Main Topics  . Smoking status: Never Smoker   . Smokeless tobacco: Not on file  . Alcohol Use: No  . Drug Use: No  . Sexually Active: Not on file   Other Topics Concern  . Not on file   Social History Narrative  . No narrative on file    Review of Systems:  All systems reviewed.  They are negative to the above problem except as previously stated.  Vital Signs: BP 140/67  Pulse 72  Resp 18  Ht 5\' 3"  (1.6 m)  Wt 177 lb (80.287 kg)  BMI 31.35 kg/m2  Physical Exam Patient is in NAD HEENT:  Normocephalic, atraumatic. EOMI, PERRLA.  Neck: JVP is normal.  No bruits.  Lungs: clear to auscultation. No rales no wheezes.  Heart: Regular rate and rhythm. Normal S1, S2. No S3.   No significant murmurs. PMI not displaced.  Abdomen:  Supple, nontender. Normal bowel sounds. No masses. No hepatomegaly.  Extremities:   Good distal pulses throughout. No lower extremity edema. Healed wound on L Musculoskeletal :moving all extremities.  Neuro:   alert and oriented x3.  CN II-XII grossly intact.  EKG:  SR 87  Poor R wave progression. Assessment and Plan:  1.  CAD.  Concerning his her SOB I would recomm labs and an echo to evaluate Continue activities as tolerated  2.  HL  Keep on statin  Patient is still living alone  I am concerned about her ability to care for self without injury.  Follow.

## 2012-10-11 NOTE — Patient Instructions (Addendum)
Lab work today. Will call you with results. 

## 2012-10-12 LAB — CBC WITH DIFFERENTIAL/PLATELET
Basophils Absolute: 0 10*3/uL (ref 0.0–0.1)
HCT: 33.6 % — ABNORMAL LOW (ref 36.0–46.0)
Lymphs Abs: 0.6 10*3/uL — ABNORMAL LOW (ref 0.7–4.0)
MCV: 89.3 fl (ref 78.0–100.0)
Monocytes Absolute: 0.6 10*3/uL (ref 0.1–1.0)
Monocytes Relative: 11.7 % (ref 3.0–12.0)
Neutrophils Relative %: 70 % (ref 43.0–77.0)
Platelets: 204 10*3/uL (ref 150.0–400.0)
RDW: 14.5 % (ref 11.5–14.6)
WBC: 5.3 10*3/uL (ref 4.5–10.5)

## 2012-10-12 LAB — BASIC METABOLIC PANEL
Calcium: 8.7 mg/dL (ref 8.4–10.5)
GFR: 95.28 mL/min (ref >60.00)
Potassium: 4 mEq/L (ref 3.5–5.1)
Sodium: 136 mEq/L (ref 135–145)

## 2012-10-12 LAB — TSH: TSH: 2.4 u[IU]/mL (ref 0.35–5.50)

## 2012-10-12 LAB — BRAIN NATRIURETIC PEPTIDE: Pro B Natriuretic peptide (BNP): 144 pg/mL — ABNORMAL HIGH (ref 0.0–100.0)

## 2012-10-18 ENCOUNTER — Other Ambulatory Visit: Payer: Self-pay | Admitting: *Deleted

## 2012-10-18 DIAGNOSIS — I25810 Atherosclerosis of coronary artery bypass graft(s) without angina pectoris: Secondary | ICD-10-CM

## 2012-11-02 ENCOUNTER — Ambulatory Visit (INDEPENDENT_AMBULATORY_CARE_PROVIDER_SITE_OTHER): Payer: Medicare Other | Admitting: Internal Medicine

## 2012-11-02 ENCOUNTER — Ambulatory Visit (HOSPITAL_COMMUNITY): Payer: Medicare Other

## 2012-11-02 ENCOUNTER — Encounter: Payer: Self-pay | Admitting: Internal Medicine

## 2012-11-02 VITALS — BP 150/68 | HR 88 | Temp 97.5°F | Resp 20 | Wt 182.0 lb

## 2012-11-02 DIAGNOSIS — R2681 Unsteadiness on feet: Secondary | ICD-10-CM

## 2012-11-02 DIAGNOSIS — M1990 Unspecified osteoarthritis, unspecified site: Secondary | ICD-10-CM

## 2012-11-02 DIAGNOSIS — I10 Essential (primary) hypertension: Secondary | ICD-10-CM

## 2012-11-02 DIAGNOSIS — I2510 Atherosclerotic heart disease of native coronary artery without angina pectoris: Secondary | ICD-10-CM

## 2012-11-02 DIAGNOSIS — R269 Unspecified abnormalities of gait and mobility: Secondary | ICD-10-CM

## 2012-11-02 DIAGNOSIS — E785 Hyperlipidemia, unspecified: Secondary | ICD-10-CM

## 2012-11-02 NOTE — Patient Instructions (Addendum)
Limit your sodium (Salt) intake  Use furosemide daily as needed for fluid control.59m   Return in 6 months for follow-up

## 2012-11-02 NOTE — Progress Notes (Signed)
Subjective:    Patient ID: [name], female    DOB: 1932/08/02, 78 y.o.   MRN: [id number]  HPI  78 year old patient who is seen today for her biannual followup. She has treated hypertension and is followed by cardiology for coronary artery disease. She has had a recent cardiology appointment last month and is scheduled for a follow up 2-D echocardiogram this afternoon. She has a history of osteoarthritis as well as psoriatic arthritis. She is followed by rheumatology. She is considering back surgery after the holidays. She has dyslipidemia. She has a history of a gait abnormality and does use a walker. She has done better with less frequent falls. And Past Medical History  Diagnosis Date  . CORONARY ATHEROSCLEROSIS, ARTERY BYPASS GRAFT 08/23/2008  . DEPRESSION 05/21/2007  . FIBROCYSTIC BREAST DISEASE, HX OF 05/21/2007  . HYPERLIPIDEMIA-MIXED 11/26/2008  . Irritable bowel syndrome 05/21/2007  . KNEE PAIN 09/16/2007  . LEG EDEMA, BILATERAL 05/21/2009  . OSTEOARTHRITIS 01/20/2008  . OSTEOPENIA 05/21/2007  . URTICARIA, CHRONIC 05/21/2007  . DJD (degenerative joint disease)     history of   . History of aortic aneurysm     resection and grafting of a 5.2-cm ascending aortic arch aneurysm August 2009  . Dyslipidemia   . Vitamin D deficiency   . Urticaria   . Rheumatoid arthritis   . Neuropathy     left radial  neuropathy with wrist drop  . CAD (coronary artery disease)     s/p CABG -  x 1 with LIMA to LAD August 2009 /   . Diverticulosis   . Irritable bowel syndrome   . GERD (gastroesophageal reflux disease)   . HYPERTENSION 05/11/2008    seen on 01/23/2012, by Dr. Tenny Craw, will have ECHO- 01/27/2012  . 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 Vaginal yeast infection     recently told to use monistat, hasn't started as of 01/26/2012    History   Social History  . Marital Status: Widowed    Spouse Name: N/A    Number of Children: N/A  . Years of Education: N/A   Occupational  History  . Not on file.   Social History Main Topics  . Smoking status: Never Smoker   . Smokeless tobacco: Not on file  . Alcohol Use: No  . Drug Use: No  . Sexually Active: Not on file   Other Topics Concern  . Not on file   Social History Narrative  . No narrative on file    Past Surgical History  Procedure Date  . Appendectomy   . Cesarean section   . Hernia repair   . Coronary artery bypass graft August 2009    CABG x 1 with LIMA to LAD /  . Ascending aortic aneurysm repair August 2009     resection and grafting of a 5.2-cm ascending aortic arch aneurysm  . Joint replacement     2009 & 2010- both knees  . Back surgery     2011- cerv. fusion   . Tonsillectomy     as a child  . Breast surgery     biopsy x2- R breast    Family History  Problem Relation Age of Onset  . Heart disease    . Coronary artery disease    . Anesthesia problems Neg Hx   . Hypotension Neg Hx   . Malignant hyperthermia Neg Hx   . Pseudochol deficiency Neg Hx  Allergies  Allergen Reactions  . Amoxicillin     REACTION: unspecified  . Cyclobenzaprine Hcl     REACTION: rash  . Penicillins Hives  . Tetanus Toxoid Hives    Current Outpatient Prescriptions on File Prior to Visit  Medication Sig Dispense Refill  . aspirin 81 MG tablet Take 81 mg by mouth daily.        . calcium carbonate (TUMS - DOSED IN MG ELEMENTAL CALCIUM) 500 MG chewable tablet Chew 2 tablets by mouth at bedtime.      . cetirizine (ZYRTEC) 10 MG tablet Take 10 mg by mouth daily as needed. Alternating with Hydroxyzine  For allergies      . folic acid (FOLVITE) 1 MG tablet Take 1 mg by mouth 2 (two) times daily.        . furosemide (LASIX) 40 MG tablet Take 40 mg by mouth 2 (two) times a week. AS NEEDED  For fluid retension      . hydrochlorothiazide (HYDRODIURIL) 25 MG tablet Take 25 mg by mouth daily.      Marland Kitchen HYDROcodone-acetaminophen (LORCET 10/650) 10-650 MG per tablet Take 1 tablet by mouth every 6 (six) hours  as needed for pain.  50 tablet  3  . hydroxychloroquine (PLAQUENIL) 200 MG tablet Take 200 mg by mouth 2 (two) times daily.       . hydrOXYzine (ATARAX/VISTARIL) 25 MG tablet Take 25 mg by mouth every 4 (four) hours as needed. For allergies alternate with Zyrtec      . metoprolol succinate (TOPROL-XL) 50 MG 24 hr tablet Take 50 mg by mouth daily.       . potassium chloride SA (KLOR-CON M20) 20 MEQ tablet Take 20 mEq by mouth 2 (two) times a week. As needed with Furosemide      . simvastatin (ZOCOR) 40 MG tablet Take 40 mg by mouth at bedtime.         BP 150/68  Pulse 88  Temp 97.5 F (36.4 C) (Oral)  Resp 20  Wt 182 lb (82.555 kg)  SpO2 95%        Review of Systems  Constitutional: Negative.   HENT: Negative for hearing loss, congestion, sore throat, rhinorrhea, dental problem, sinus pressure and tinnitus.   Eyes: Negative for pain, discharge and visual disturbance.  Respiratory: Negative for cough and shortness of breath.   Cardiovascular: Negative for chest pain, palpitations and leg swelling.  Gastrointestinal: Negative for nausea, vomiting, abdominal pain, diarrhea, constipation, blood in stool and abdominal distention.  Genitourinary: Negative for dysuria, urgency, frequency, hematuria, flank pain, vaginal bleeding, vaginal discharge, difficulty urinating, vaginal pain and pelvic pain.  Musculoskeletal: Negative for joint swelling, arthralgias and gait problem.  Skin: Negative for rash.  Neurological: Negative for dizziness, syncope, speech difficulty, weakness, numbness and headaches.  Hematological: Negative for adenopathy.  Psychiatric/Behavioral: Negative for behavioral problems, dysphoric mood and agitation. The patient is not nervous/anxious.        Objective:   Physical Exam  Constitutional: She is oriented to person, place, and time. She appears well-developed and well-nourished.  HENT:  Head: Normocephalic.  Right Ear: External ear normal.  Left Ear: External  ear normal.  Mouth/Throat: Oropharynx is clear and moist.  Eyes: Conjunctivae normal and EOM are normal. Pupils are equal, round, and reactive to light.  Neck: Normal range of motion. Neck supple. No thyromegaly present.  Cardiovascular: Normal rate, regular rhythm, normal heart sounds and intact distal pulses.        Dorsalis pedis  pulses full  Pulmonary/Chest: Effort normal and breath sounds normal.  Abdominal: Soft. Bowel sounds are normal. She exhibits no mass. There is no tenderness.  Genitourinary:       Right greater than left ankle edema  Musculoskeletal: Normal range of motion. She exhibits edema.  Lymphadenopathy:    She has no cervical adenopathy.  Neurological: She is alert and oriented to person, place, and time.  Skin: Skin is warm and dry. No rash noted.  Psychiatric: She has a normal mood and affect. Her behavior is normal.          Assessment & Plan:  Hypertension stable Coronary artery disease asymptomatic Dyslipidemia Osteoarthritis Psoriatic arthritis History chronic urticaria  We'll continue followup with her multiple consultants Recheck here when necessary or in 6 months Medications refilled

## 2012-11-09 ENCOUNTER — Other Ambulatory Visit (HOSPITAL_COMMUNITY): Payer: Medicare Other

## 2012-11-15 ENCOUNTER — Other Ambulatory Visit: Payer: Self-pay | Admitting: Internal Medicine

## 2012-11-19 ENCOUNTER — Ambulatory Visit (HOSPITAL_COMMUNITY): Payer: Medicare Other | Attending: Internal Medicine | Admitting: Radiology

## 2012-11-19 DIAGNOSIS — I10 Essential (primary) hypertension: Secondary | ICD-10-CM | POA: Insufficient documentation

## 2012-11-19 DIAGNOSIS — I369 Nonrheumatic tricuspid valve disorder, unspecified: Secondary | ICD-10-CM | POA: Insufficient documentation

## 2012-11-19 DIAGNOSIS — I25810 Atherosclerosis of coronary artery bypass graft(s) without angina pectoris: Secondary | ICD-10-CM

## 2012-11-19 DIAGNOSIS — I379 Nonrheumatic pulmonary valve disorder, unspecified: Secondary | ICD-10-CM | POA: Insufficient documentation

## 2012-11-19 DIAGNOSIS — I080 Rheumatic disorders of both mitral and aortic valves: Secondary | ICD-10-CM | POA: Insufficient documentation

## 2012-11-19 DIAGNOSIS — R0989 Other specified symptoms and signs involving the circulatory and respiratory systems: Secondary | ICD-10-CM | POA: Insufficient documentation

## 2012-11-19 DIAGNOSIS — R0609 Other forms of dyspnea: Secondary | ICD-10-CM | POA: Insufficient documentation

## 2012-11-19 DIAGNOSIS — I2510 Atherosclerotic heart disease of native coronary artery without angina pectoris: Secondary | ICD-10-CM | POA: Insufficient documentation

## 2012-11-19 NOTE — Progress Notes (Signed)
Echocardiogram performed.  

## 2012-11-25 ENCOUNTER — Ambulatory Visit (INDEPENDENT_AMBULATORY_CARE_PROVIDER_SITE_OTHER): Payer: Medicare Other | Admitting: Emergency Medicine

## 2012-11-25 VITALS — BP 152/69 | HR 80 | Temp 98.0°F | Resp 18

## 2012-11-25 DIAGNOSIS — S0180XA Unspecified open wound of other part of head, initial encounter: Secondary | ICD-10-CM

## 2012-11-25 DIAGNOSIS — R51 Headache: Secondary | ICD-10-CM

## 2012-11-25 MED ORDER — CLINDAMYCIN HCL 150 MG PO CAPS: 150.0000 mg | ORAL_CAPSULE | Freq: Three times a day (TID) | ORAL | Status: DC

## 2012-11-25 NOTE — Progress Notes (Signed)
PROCEDURE: Verbal consent obtained from the patient.  Local anesthesia with 8cc Lidocaine 2% without epinephrine.  Wound scrubbed with soap and water and rinsed.  Wound closed with #17 5-0 Ethilon Horizontal and Simple Interrupted sutures.  Wound cleansed and dressed.

## 2012-11-25 NOTE — Progress Notes (Signed)
[facility] [address], [place] [address] [phone]  Date:  11/25/2012   Name:  [name]   DOB:  1932/10/21   MRN:  [id number]  PCP:  Rogelia Boga, MD    Chief Complaint: Facial Pain   History of Present Illness:  [name] is a 79 y.o. very pleasant female patient who presents with the following:  Patient with a history of recurrent falls due to inability to maintain suitable balance.  Larey Seat today at home and struck her right cheek on a towel rack.  Has an extensive laceration right cheek.  No neurologic or visual symptoms.  Denies other complaints.  Not current on tetanus she is allergic to toxoid.    Patient Active Problem List  Diagnosis  . HYPERLIPIDEMIA-MIXED  . DEPRESSION  . HYPERTENSION  . CORONARY ATHEROSCLEROSIS, ARTERY BYPASS GRAFT  . IRRITABLE BOWEL SYNDROME  . URTICARIA, CHRONIC  . OSTEOARTHRITIS  . KNEE PAIN  . OSTEOPENIA  . LEG EDEMA, BILATERAL  . FIBROCYSTIC BREAST DISEASE, HX OF  . History of aortic aneurysm  . CAD (coronary artery disease)  . Gait instability    Past Medical History  Diagnosis Date  . CORONARY ATHEROSCLEROSIS, ARTERY BYPASS GRAFT 08/23/2008  . DEPRESSION 05/21/2007  . FIBROCYSTIC BREAST DISEASE, HX OF 05/21/2007  . HYPERLIPIDEMIA-MIXED 11/26/2008  . Irritable bowel syndrome 05/21/2007  . KNEE PAIN 09/16/2007  . LEG EDEMA, BILATERAL 05/21/2009  . OSTEOARTHRITIS 01/20/2008  . OSTEOPENIA 05/21/2007  . URTICARIA, CHRONIC 05/21/2007  . DJD (degenerative joint disease)     history of   . History of aortic aneurysm     resection and grafting of a 5.2-cm ascending aortic arch aneurysm August 2009  . Dyslipidemia   . Vitamin D deficiency   . Urticaria   . Rheumatoid arthritis   . Neuropathy     left radial  neuropathy with wrist drop  . CAD (coronary artery disease)     s/p CABG -  x 1 with LIMA to LAD August 2009 /   . Diverticulosis   . Irritable bowel syndrome   . GERD  (gastroesophageal reflux disease)   . HYPERTENSION 05/11/2008    seen on 01/23/2012, by Dr. Tenny Craw, will have ECHO- 01/27/2012  . 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 Vaginal yeast infection     recently told to use monistat, hasn't started as of 01/26/2012    Past Surgical History  Procedure Date  . Appendectomy   . Cesarean section   . Hernia repair   . Coronary artery bypass graft August 2009    CABG x 1 with LIMA to LAD /  . Ascending aortic aneurysm repair August 2009     resection and grafting of a 5.2-cm ascending aortic arch aneurysm  . Joint replacement     2009 & 2010- both knees  . Back surgery     2011- cerv. fusion   . Tonsillectomy     as a child  . Breast surgery     biopsy x2- R breast    History  Substance Use Topics  . Smoking status: Never Smoker   . Smokeless tobacco: Not on file  . Alcohol Use: No    Family History  Problem Relation Age of Onset  . Heart disease    . Coronary artery disease    . Anesthesia problems Neg Hx   . Hypotension Neg Hx   . Malignant  hyperthermia Neg Hx   . Pseudochol deficiency Neg Hx   . Heart disease Mother   . Stroke Father   . Cancer Maternal Grandmother   . Stroke Paternal Grandmother     Allergies  Allergen Reactions  . Amoxicillin     REACTION: unspecified  . Cyclobenzaprine Hcl     REACTION: rash  . Penicillins Hives  . Tetanus Toxoid Hives    Medication list has been reviewed and updated.  Current Outpatient Prescriptions on File Prior to Visit  Medication Sig Dispense Refill  . aspirin 81 MG tablet Take 81 mg by mouth daily.        . calcium carbonate (TUMS - DOSED IN MG ELEMENTAL CALCIUM) 500 MG chewable tablet Chew 2 tablets by mouth at bedtime.      . cetirizine (ZYRTEC) 10 MG tablet Take 10 mg by mouth daily as needed. Alternating with Hydroxyzine  For allergies      . folic acid (FOLVITE) 1 MG tablet Take 1 mg by mouth 2 (two) times daily.        . furosemide (LASIX) 40 MG tablet Take  40 mg by mouth 2 (two) times a week. AS NEEDED  For fluid retension      . hydrochlorothiazide (HYDRODIURIL) 25 MG tablet Take 25 mg by mouth daily.      Marland Kitchen HYDROcodone-acetaminophen (LORCET) 10-650 MG per tablet TAKE 1 TABLET BY MOUTH EVERY 6 HOURS AS NEEDED FOR PAIN  50 tablet  3  . hydroxychloroquine (PLAQUENIL) 200 MG tablet Take 200 mg by mouth 2 (two) times daily.       . hydrOXYzine (ATARAX/VISTARIL) 25 MG tablet Take 25 mg by mouth every 4 (four) hours as needed. For allergies alternate with Zyrtec      . metoprolol succinate (TOPROL-XL) 50 MG 24 hr tablet Take 50 mg by mouth daily.       . potassium chloride SA (KLOR-CON M20) 20 MEQ tablet Take 20 mEq by mouth 2 (two) times a week. As needed with Furosemide      . simvastatin (ZOCOR) 40 MG tablet Take 40 mg by mouth at bedtime.         Review of Systems:  As per HPI, otherwise negative.    Physical Examination: Filed Vitals:   11/25/12 1829  BP: 152/69  Pulse: 80  Temp: 98 F (36.7 C)  Resp: 18   There were no vitals filed for this visit. There is no height or weight on file to calculate BMI. Ideal Body Weight:     GEN: WDWN, NAD, Non-toxic, Alert & Oriented x 3 HEENT: PRRERLA EOMI.  CN 2-12 intact, Normocephalic.  Ears and Nose: No external deformity. EXTR: No clubbing/cyanosis/edema NEURO: Normal gait with wheeled walker.Marland Kitchen  PSYCH: Normally interactive. Conversant. Not depressed or anxious appearing.  Calm demeanor.  Face:  Complex laceration right cheek.  Full thickness wound with multiple u shaped flaps.    Assessment and Plan: Facial pain Complex laceration cheek Keflex   Carmelina Dane, MD

## 2012-11-25 NOTE — Patient Instructions (Signed)
WOUND CARE Please return in 5-7 days to have your stitches/staples removed or sooner if you have concerns. . Keep area clean and dry for 24 hours. Do not remove bandage, if applied. . After 24 hours, remove bandage and wash wound gently with mild soap and warm water. Reapply a new bandage after cleaning wound, if directed. . Continue daily cleansing with soap and water until stitches/staples are removed. . Do not apply any ointments or creams to the wound while stitches/staples are in place, as this may cause delayed healing. . Notify the office if you experience any of the following signs of infection: Swelling, redness, pus drainage, streaking, fever >101.0 F . Notify the office if you experience excessive bleeding that does not stop after 15-20 minutes of constant, firm pressure.   

## 2012-12-01 ENCOUNTER — Ambulatory Visit (INDEPENDENT_AMBULATORY_CARE_PROVIDER_SITE_OTHER): Payer: Medicare Other | Admitting: Emergency Medicine

## 2012-12-01 VITALS — BP 170/52 | HR 84 | Temp 97.7°F | Resp 16

## 2012-12-01 DIAGNOSIS — S0180XA Unspecified open wound of other part of head, initial encounter: Secondary | ICD-10-CM

## 2012-12-01 DIAGNOSIS — I10 Essential (primary) hypertension: Secondary | ICD-10-CM

## 2012-12-01 DIAGNOSIS — Z4889 Encounter for other specified surgical aftercare: Secondary | ICD-10-CM

## 2012-12-01 NOTE — Patient Instructions (Signed)
Continue to wash your wound once each day. You may now apply an antibiotic ointment to the wound (in a very thin layer) 1-2 times each day. Using a daily sunscreen can help minimize the appearance of the scar.  Once the scab is gone, you may use Mederma on the area. If you develop INCREASED redness, swelling, pain or drainage, please return for re-evaluation.

## 2012-12-01 NOTE — Progress Notes (Signed)
  Subjective:    Patient ID: [name], female    DOB: 1932/01/13, 79 y.o.   MRN: [id number]  Suture / Staple Removal The sutures were placed 3 to 6 days ago. She tried antibiotic ointment use since the wound repair. The treatment provided significant relief. There has been no drainage from the wound. There is no redness present. The swelling has worsened. The pain has improved.      Review of Systems  As per HPI, otherwise negative.      Objective:   Physical Exam   GEN: WDWN, NAD, Non-toxic, Alert & Oriented x 3 HEENT: wound healed with some crusting, Normocephalic.  Ears and Nose: No external deformity. EXTR: No clubbing/cyanosis/edema NEURO: Normal gait.  PSYCH: Normally interactive. Conversant. Not depressed or anxious appearing.  Calm demeanor.        Assessment & Plan:   Sutures removed and discussed scar minimization To follow up if evidence wound infection

## 2012-12-01 NOTE — Progress Notes (Signed)
  Subjective:    Patient ID: [name], female    DOB: 1932-07-23, 79 y.o.   MRN: [id number]  HPI  This 79 y.o. female presents for evaluation of a wound of the RIGHT cheek, s/p suture repair, ready for suture removal.  She's developed a lot of swelling and bruising, not unexpected given the mechanism of action.  She's had no drainage.  No fever, chills.  She notes that her BP is elevated today, which concerns her.  She was rushing today, running errands, and then spent 90 minutes looking for her 3-Carat diamond ring, which she still hasn't found.  She's hoping it's in the pocket of her bathrobe.  No CP, SOB, HA, dizziness. Review of Systems As above.    Objective:   Physical Exam BP 170/52  Pulse 84  Temp 97.7 F (36.5 C) (Oral)  Resp 16  SpO2 98%  Repeat BP 148/66, manual pressure in the RIGHT arm.  WDWNWF, A&O x 3. Wound is healing well.  Resolving ecchymosis.  Contusion present involving most of the RIGHT cheek. 1 cm eschar centrally where the skin was especially thin.  #17 Ethilon sutures removed without incident.       Assessment & Plan:   1. Wound, open, face   2. HYPERTENSION    Continue local wound care.  Follow-up PRN.

## 2012-12-02 ENCOUNTER — Telehealth: Payer: Self-pay

## 2012-12-02 NOTE — Telephone Encounter (Signed)
Pt called and stated she had sutures removed yesterday and having a lot of drainage. Please call Pt back to discuss. Cell phone [phone] if no answer may leave msg on home phone [phone]

## 2012-12-03 ENCOUNTER — Ambulatory Visit (INDEPENDENT_AMBULATORY_CARE_PROVIDER_SITE_OTHER): Payer: Medicare Other | Admitting: Physician Assistant

## 2012-12-03 ENCOUNTER — Encounter: Payer: Self-pay | Admitting: Physician Assistant

## 2012-12-03 VITALS — BP 149/69 | HR 81 | Temp 97.6°F | Resp 16 | Ht 63.0 in | Wt 179.0 lb

## 2012-12-03 DIAGNOSIS — S0180XA Unspecified open wound of other part of head, initial encounter: Secondary | ICD-10-CM

## 2012-12-03 NOTE — Telephone Encounter (Signed)
She needs to come in for this today, now. Sutures were on her face. She states she will come in this afternoon.

## 2012-12-03 NOTE — Progress Notes (Signed)
  Subjective:    Patient ID: [name], female    DOB: [birth year], 79 y.o.   MRN: [id number]  HPI 79 year old female presents for recheck of facial wound. DOI 11/25/12 - repaired with sutures which were then removed on 12/01/12.  Since then she has noticed clear drainage from the wound.  No erythema, warmth, or purulent drainage.  States she has been taking her antibiotic regularly and still has a few days left.  Admits the area is still tender to touch and also does get sore if she does a lot of chewing. Otherwise she is doing well without any complaints today.     Review of Systems  Constitutional: Negative for fever and chills.  Skin: Positive for color change (eccymosis of right cheek and scabbing over wound) and wound.  All other systems reviewed and are negative.       Objective:   Physical Exam  Constitutional: She is oriented to person, place, and time. She appears well-developed and well-nourished.  HENT:  Head: Normocephalic and atraumatic.  Right Ear: External ear normal.  Left Ear: External ear normal.  Eyes: Conjunctivae normal are normal.  Neck: Normal range of motion.  Neurological: She is alert and oriented to person, place, and time.  Skin:       Right cheek well healing with small area draining clear, serous fluid.  No surrounding erythema, warmth, or fluctuance.  Small scab over upper wound with ecchymosis.    Psychiatric: She has a normal mood and affect. Her behavior is normal. Judgment and thought content normal.          Assessment & Plan:   1. Wound, open, face   No evidence of infection today.  Continue to keep covered until lower wound closes and is no longer draining fluid RTC if changes to purulent drainage, area becomes warm, or she develops fever or chills Complete antibiotics.  Follow up as needed.

## 2012-12-20 ENCOUNTER — Other Ambulatory Visit: Payer: Self-pay | Admitting: Internal Medicine

## 2012-12-21 ENCOUNTER — Other Ambulatory Visit: Payer: Self-pay | Admitting: *Deleted

## 2012-12-21 MED ORDER — HYDROCODONE-ACETAMINOPHEN 5-325 MG PO TABS: 1.0000 | ORAL_TABLET | Freq: Four times a day (QID) | ORAL | Status: DC | PRN

## 2012-12-29 ENCOUNTER — Other Ambulatory Visit: Payer: Self-pay | Admitting: Neurosurgery

## 2012-12-29 DIAGNOSIS — M5416 Radiculopathy, lumbar region: Secondary | ICD-10-CM

## 2012-12-29 DIAGNOSIS — M5136 Other intervertebral disc degeneration, lumbar region: Secondary | ICD-10-CM

## 2012-12-29 DIAGNOSIS — M5414 Radiculopathy, thoracic region: Secondary | ICD-10-CM

## 2012-12-31 ENCOUNTER — Ambulatory Visit
Admission: RE | Admit: 2012-12-31 | Discharge: 2012-12-31 | Disposition: A | Discharge status: Home / Self Care | Payer: Medicare Other | Source: Ambulatory Visit | Attending: Neurosurgery | Admitting: Neurosurgery

## 2012-12-31 ENCOUNTER — Other Ambulatory Visit: Payer: Self-pay | Admitting: Internal Medicine

## 2012-12-31 DIAGNOSIS — M51369 Other intervertebral disc degeneration, lumbar region without mention of lumbar back pain or lower extremity pain: Secondary | ICD-10-CM

## 2012-12-31 DIAGNOSIS — M5416 Radiculopathy, lumbar region: Secondary | ICD-10-CM

## 2012-12-31 DIAGNOSIS — M5414 Radiculopathy, thoracic region: Secondary | ICD-10-CM

## 2012-12-31 DIAGNOSIS — M5136 Other intervertebral disc degeneration, lumbar region: Secondary | ICD-10-CM

## 2013-01-15 ENCOUNTER — Other Ambulatory Visit: Payer: Self-pay | Admitting: Internal Medicine

## 2013-01-21 ENCOUNTER — Telehealth: Payer: Self-pay | Admitting: Internal Medicine

## 2013-01-21 ENCOUNTER — Ambulatory Visit (INDEPENDENT_AMBULATORY_CARE_PROVIDER_SITE_OTHER): Payer: Medicare Other | Admitting: Internal Medicine

## 2013-01-21 ENCOUNTER — Encounter: Payer: Self-pay | Admitting: Internal Medicine

## 2013-01-21 VITALS — BP 120/60 | HR 87 | Temp 97.0°F | Resp 20 | Wt 188.0 lb

## 2013-01-21 DIAGNOSIS — S0003XA Contusion of scalp, initial encounter: Secondary | ICD-10-CM

## 2013-01-21 DIAGNOSIS — I25810 Atherosclerosis of coronary artery bypass graft(s) without angina pectoris: Secondary | ICD-10-CM

## 2013-01-21 DIAGNOSIS — R269 Unspecified abnormalities of gait and mobility: Secondary | ICD-10-CM

## 2013-01-21 DIAGNOSIS — I10 Essential (primary) hypertension: Secondary | ICD-10-CM

## 2013-01-21 DIAGNOSIS — S1093XA Contusion of unspecified part of neck, initial encounter: Secondary | ICD-10-CM

## 2013-01-21 DIAGNOSIS — R2681 Unsteadiness on feet: Secondary | ICD-10-CM

## 2013-01-21 NOTE — Patient Instructions (Signed)
Limit your sodium (Salt) intake  Return in 3 months for follow-up   

## 2013-01-21 NOTE — Progress Notes (Signed)
Subjective:    Patient ID: [name], female    DOB: 1932/04/04, 79 y.o.   MRN: [id number]  HPI  79 year old patient who has multiple medical problems including coronary artery disease and hypertension. She has a history of gait instability and numerous falls. One week ago she fell again sustaining trauma to her right frontal scalp area. There is no loss of consciousness She has a history low back pain and is scheduled for orthopedic followup soon;  recent MRI did reveal severe spinal stenosis  Past Medical History  Diagnosis Date  . CORONARY ATHEROSCLEROSIS, ARTERY BYPASS GRAFT 08/23/2008  . DEPRESSION 05/21/2007  . FIBROCYSTIC BREAST DISEASE, HX OF 05/21/2007  . HYPERLIPIDEMIA-MIXED 11/26/2008  . Irritable bowel syndrome 05/21/2007  . KNEE PAIN 09/16/2007  . LEG EDEMA, BILATERAL 05/21/2009  . OSTEOARTHRITIS 01/20/2008  . OSTEOPENIA 05/21/2007  . URTICARIA, CHRONIC 05/21/2007  . DJD (degenerative joint disease)     history of   . History of aortic aneurysm     resection and grafting of a 5.2-cm ascending aortic arch aneurysm August 2009  . Dyslipidemia   . Vitamin D deficiency   . Urticaria   . Rheumatoid arthritis   . Neuropathy     left radial  neuropathy with wrist drop  . CAD (coronary artery disease)     s/p CABG -  x 1 with LIMA to LAD August 2009 /   . Diverticulosis   . Irritable bowel syndrome   . GERD (gastroesophageal reflux disease)   . HYPERTENSION 05/11/2008    seen on 01/23/2012, by Dr. Tenny Craw, will have ECHO- 01/27/2012  . 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 Vaginal yeast infection     recently told to use monistat, hasn't started as of 01/26/2012    History   Social History  . Marital Status: Widowed    Spouse Name: N/A    Number of Children: N/A  . Years of Education: N/A   Occupational History  . Not on file.   Social History Main Topics  . Smoking status: Never Smoker   . Smokeless tobacco: Not on file  . Alcohol Use: No  . Drug Use: No   . Sexually Active: Not on file   Other Topics Concern  . Not on file   Social History Narrative  . No narrative on file    Past Surgical History  Procedure Laterality Date  . Appendectomy    . Cesarean section    . Hernia repair    . Coronary artery bypass graft  August 2009    CABG x 1 with LIMA to LAD /  . Ascending aortic aneurysm repair  August 2009     resection and grafting of a 5.2-cm ascending aortic arch aneurysm  . Joint replacement      2009 & 2010- both knees  . Back surgery      2011- cerv. fusion   . Tonsillectomy      as a child  . Breast surgery      biopsy x2- R breast    Family History  Problem Relation Age of Onset  . Heart disease    . Coronary artery disease    . Anesthesia problems Neg Hx   . Hypotension Neg Hx   . Malignant hyperthermia Neg Hx   . Pseudochol deficiency Neg Hx   . Heart disease Mother   . Stroke Father   . Cancer Maternal Grandmother   .  Stroke Paternal Grandmother     Allergies  Allergen Reactions  . Amoxicillin     REACTION: unspecified  . Cyclobenzaprine Hcl     REACTION: rash  . Penicillins Hives  . Tetanus Toxoid Hives    Current Outpatient Prescriptions on File Prior to Visit  Medication Sig Dispense Refill  . aspirin 81 MG tablet Take 81 mg by mouth daily.        . calcium carbonate (TUMS - DOSED IN MG ELEMENTAL CALCIUM) 500 MG chewable tablet Chew 2 tablets by mouth at bedtime.      . cetirizine (ZYRTEC) 10 MG tablet Take 10 mg by mouth daily as needed. Alternating with Hydroxyzine  For allergies      . clindamycin (CLEOCIN) 150 MG capsule Take 1 capsule (150 mg total) by mouth 3 (three) times daily.  21 capsule  0  . folic acid (FOLVITE) 1 MG tablet Take 1 mg by mouth 2 (two) times daily.        . furosemide (LASIX) 40 MG tablet TAKE 1 TABLET BY MOUTH TWICE A WEEK AS DIRECTED  90 tablet  1  . hydrochlorothiazide (HYDRODIURIL) 25 MG tablet Take 25 mg by mouth daily.      Marland Kitchen HYDROcodone-acetaminophen  (NORCO/VICODIN) 5-325 MG per tablet TAKE 1 TABLET BY MOUTH EVERY 6 HOURS AS NEEDED FOR PAIN  50 tablet  1  . hydroxychloroquine (PLAQUENIL) 200 MG tablet Take 200 mg by mouth 2 (two) times daily.       . hydrOXYzine (ATARAX/VISTARIL) 25 MG tablet Take 25 mg by mouth every 4 (four) hours as needed. For allergies alternate with Zyrtec      . metoprolol succinate (TOPROL-XL) 50 MG 24 hr tablet Take 50 mg by mouth daily.       . potassium chloride SA (KLOR-CON M20) 20 MEQ tablet Take 20 mEq by mouth 2 (two) times a week. As needed with Furosemide      . simvastatin (ZOCOR) 40 MG tablet Take 40 mg by mouth at bedtime.       Marland Kitchen tobramycin-dexamethasone (TOBRADEX) ophthalmic solution        No current facility-administered medications on file prior to visit.    BP 120/60  Pulse 87  Temp(Src) 97 F (36.1 C) (Oral)  Resp 20  Wt 188 lb (85.276 kg)  BMI 33.31 kg/m2  SpO2 97%     Review of Systems  Constitutional: Negative.   HENT: Negative for hearing loss, congestion, sore throat, rhinorrhea, dental problem, sinus pressure and tinnitus.   Eyes: Negative for pain, discharge and visual disturbance.  Respiratory: Negative for cough and shortness of breath.   Cardiovascular: Negative for chest pain, palpitations and leg swelling.  Gastrointestinal: Negative for nausea, vomiting, abdominal pain, diarrhea, constipation, blood in stool and abdominal distention.  Genitourinary: Negative for dysuria, urgency, frequency, hematuria, flank pain, vaginal bleeding, vaginal discharge, difficulty urinating, vaginal pain and pelvic pain.  Musculoskeletal: Positive for arthralgias and gait problem. Negative for joint swelling.  Skin: Positive for rash.  Neurological: Negative for dizziness, syncope, speech difficulty, weakness, numbness and headaches.  Hematological: Negative for adenopathy.  Psychiatric/Behavioral: Negative for behavioral problems, dysphoric mood and agitation. The patient is not  nervous/anxious.        Objective:   Physical Exam  Constitutional: She appears well-developed and well-nourished. No distress.  Uses 4 point walker;  blood pressure 120/60   Eyes: EOM are normal. Pupils are equal, round, and reactive to light.  Skin:  Large 5 cm hematoma  right frontal scalp area Significant  Peri- Orbital ecchymoses bilaterally          Assessment & Plan:   Scalp hematoma. Patient was reassured Unsteady gait Hypertension stable Coronary artery disease Severe spinal stenosis. Followup orthopedics

## 2013-01-21 NOTE — Telephone Encounter (Signed)
[facility] Triage Call Report Triage Record Num: [id number] Operator: Andreas Ohm Patient Name: [name] Call Date & Time: 01/20/2013 5:34:02PM Patient Phone: [phone] PCP: Gordy Savers Patient Gender: Female PCP Fax : [phone] Patient DOB: October 15, 1932 [facility] Name: [facility] Reason for Call: Caller: Eleri/Patient; PCP: Eleonore Chiquito (Family Practice); CB#: [phone]; Call regarding Injury/Trauma; Onset 01/14/13. Pt fell 01/14/13 and hit her head. Large goose egg on her right forehead. Pt fell off a stool while sitting on it and hit her forehead onto the tile floor. No LOC, headache, or seizure, no neruo sx since the accident. Pt state right nostril seems to be swollen. Someone had mentioned she needed an Xray and that was the reason for her call. See ED immediately d/t ' Bruising behind one or both ears or below the eyes' per Head Injury protocol. Called Dr. Alwyn Ren and discussed disposition. Instructed if caller will be seen 01/22/12 and no sxs she can been in the office tomorrow. Caller agreed. Appt scheduled w/Dr. K @ 1530 01/21/13. Caller isntructed to call back if new sx present. Protocol(s) Used: Head Injury Recommended Outcome per Protocol: See ED Immediately Override Outcome if Used in Protocol: See Provider within 24 hours RN Reason for Override Outcome: Physician Orders. Reason for Outcome: Clear or bloody drainage from the ear, new deafness in one or both ears OR bruising behind one or both ears or below the eyes Care Advice: After a head or face injury, call EMS 911 if the person is unable to answer simple questions, can't waken easily, has change in behavior, vomits more than 2 hours after injury, has double vision or new onset of difficulty walking, weakness or numbness. ~ 02/27/

## 2013-03-02 ENCOUNTER — Telehealth: Payer: Self-pay | Admitting: *Deleted

## 2013-03-02 ENCOUNTER — Other Ambulatory Visit: Payer: Self-pay | Admitting: *Deleted

## 2013-03-02 DIAGNOSIS — I2510 Atherosclerotic heart disease of native coronary artery without angina pectoris: Secondary | ICD-10-CM

## 2013-03-02 NOTE — Telephone Encounter (Signed)
Per Dr Tenny Craw: Patient being evaluated for spinal surgery Would need lexiscan myoview to evaluate cardiac risk for surgery.   Contacted pt and ordered Lexiscan in EPIC.  PCC to contact pt to schedule.  Pt agrees to plan.

## 2013-03-03 ENCOUNTER — Other Ambulatory Visit: Payer: Self-pay | Admitting: Internal Medicine

## 2013-03-15 ENCOUNTER — Ambulatory Visit (INDEPENDENT_AMBULATORY_CARE_PROVIDER_SITE_OTHER): Payer: Medicare Other | Admitting: Family Medicine

## 2013-03-15 ENCOUNTER — Ambulatory Visit (HOSPITAL_COMMUNITY): Payer: Medicare Other | Attending: Internal Medicine | Admitting: Radiology

## 2013-03-15 ENCOUNTER — Ambulatory Visit: Payer: Medicare Other

## 2013-03-15 ENCOUNTER — Telehealth: Payer: Self-pay | Admitting: Family Medicine

## 2013-03-15 VITALS — BP 143/55 | HR 67 | Ht 63.0 in | Wt 179.0 lb

## 2013-03-15 VITALS — BP 154/62 | HR 89 | Temp 98.5°F | Resp 16 | Ht 63.0 in | Wt 179.0 lb

## 2013-03-15 DIAGNOSIS — I10 Essential (primary) hypertension: Secondary | ICD-10-CM | POA: Insufficient documentation

## 2013-03-15 DIAGNOSIS — I2510 Atherosclerotic heart disease of native coronary artery without angina pectoris: Secondary | ICD-10-CM

## 2013-03-15 DIAGNOSIS — M25571 Pain in right ankle and joints of right foot: Secondary | ICD-10-CM

## 2013-03-15 DIAGNOSIS — R0609 Other forms of dyspnea: Secondary | ICD-10-CM | POA: Insufficient documentation

## 2013-03-15 DIAGNOSIS — S93409A Sprain of unspecified ligament of unspecified ankle, initial encounter: Secondary | ICD-10-CM

## 2013-03-15 DIAGNOSIS — R0602 Shortness of breath: Secondary | ICD-10-CM | POA: Insufficient documentation

## 2013-03-15 DIAGNOSIS — S93401A Sprain of unspecified ligament of right ankle, initial encounter: Secondary | ICD-10-CM

## 2013-03-15 DIAGNOSIS — I779 Disorder of arteries and arterioles, unspecified: Secondary | ICD-10-CM | POA: Insufficient documentation

## 2013-03-15 DIAGNOSIS — E785 Hyperlipidemia, unspecified: Secondary | ICD-10-CM | POA: Insufficient documentation

## 2013-03-15 DIAGNOSIS — Z951 Presence of aortocoronary bypass graft: Secondary | ICD-10-CM | POA: Insufficient documentation

## 2013-03-15 DIAGNOSIS — Z8249 Family history of ischemic heart disease and other diseases of the circulatory system: Secondary | ICD-10-CM | POA: Insufficient documentation

## 2013-03-15 DIAGNOSIS — R0989 Other specified symptoms and signs involving the circulatory and respiratory systems: Secondary | ICD-10-CM | POA: Insufficient documentation

## 2013-03-15 DIAGNOSIS — Z01810 Encounter for preprocedural cardiovascular examination: Secondary | ICD-10-CM | POA: Insufficient documentation

## 2013-03-15 DIAGNOSIS — M25579 Pain in unspecified ankle and joints of unspecified foot: Secondary | ICD-10-CM

## 2013-03-15 DIAGNOSIS — I739 Peripheral vascular disease, unspecified: Secondary | ICD-10-CM | POA: Insufficient documentation

## 2013-03-15 MED ORDER — HYDROCODONE-ACETAMINOPHEN 5-325 MG PO TABS: 1.0000 | ORAL_TABLET | Freq: Four times a day (QID) | ORAL | Status: DC | PRN

## 2013-03-15 MED ORDER — REGADENOSON 0.4 MG/5ML IV SOLN
0.4000 mg | Freq: Once | INTRAVENOUS | Status: AC
  Administered 2013-03-15: 0.4 mg via INTRAVENOUS

## 2013-03-15 MED ORDER — TECHNETIUM TC 99M SESTAMIBI GENERIC - CARDIOLITE
33.0000 | Freq: Once | INTRAVENOUS | Status: AC | PRN
  Administered 2013-03-15: 33 via INTRAVENOUS

## 2013-03-15 MED ORDER — TECHNETIUM TC 99M SESTAMIBI GENERIC - CARDIOLITE
10.9000 | Freq: Once | INTRAVENOUS | Status: AC | PRN
  Administered 2013-03-15: 10.9 via INTRAVENOUS

## 2013-03-15 NOTE — Progress Notes (Signed)
[facility] SITE 3 NUCLEAR MED [address] [place], [place] [address] [phone]    Cardiology Nuclear Med Study  [name] is a 79 y.o. female     MRN : [id number]     DOB: [birth year]  Procedure Date: 03/15/2013  Nuclear Med Background Indication for Stress Test:  Evaluation for Ischemia, Graft Patency and Clearance for Pending Back Surgery by Dr. Donalee Citrin History:  '09 CABG, EF=60%; '09 AAA Repair; '13 Echo:EF=65% Cardiac Risk Factors: Carotid Disease, Family History - CAD, Hypertension, Lipids and PVD  Symptoms:  DOE and SOB   Nuclear Pre-Procedure Caffeine/Decaff Intake:  None NPO After: 11:30pm   Lungs:  Clear. O2 Sat: 98% on room air. IV 0.9% NS with Angio Cath:  22g  IV Site: R Antecubital  IV Started by:  Milana Na, EMT-P  Chest Size (in):  38 Cup Size: B  Height: 5\' 3"  (1.6 m)  Weight:  179 lb (81.194 kg)  BMI:  Body mass index is 31.72 kg/(m^2). Tech Comments:  Rx this am    Nuclear Med Study 1 or 2 day study: 1 day  Stress Test Type:  Lexiscan  Reading MD: Kristeen Miss, MD  Order Authorizing Provider:  Dietrich Pates, MD  Resting Radionuclide: Technetium 23m Sestamibi  Resting Radionuclide Dose: 10.9 mCi   Stress Radionuclide:  Technetium 38m Sestamibi  Stress Radionuclide Dose: 33.0 mCi           Stress Protocol Rest HR: 67 Stress HR: 80  Rest BP: 143/55 Stress BP: 149/52  Exercise Time (min): n/a METS: n/a   Predicted Max HR: 142 bpm % Max HR: 56.34 bpm Rate Pressure Product: 21308   Dose of Adenosine (mg):  n/a Dose of Lexiscan: 0.4 mg  Dose of Atropine (mg): n/a Dose of Dobutamine: n/a mcg/kg/min (at max HR)  Stress Test Technologist: Smiley Houseman, CMA-N  Nuclear Technologist:  Domenic Polite, CNMT     Rest Procedure:  Myocardial perfusion imaging was performed at rest 45 minutes following the intravenous administration of Technetium 63m Sestamibi.  Rest ECG: NSR - Normal EKG  Stress Procedure:  The patient received  IV Lexiscan 0.4 mg over 15-seconds.  Technetium 40m Sestamibi injected at 30-seconds.  Quantitative spect images were obtained after a 45 minute delay.  Stress ECG: No significant change from baseline ECG  QPS Raw Data Images:  Normal; no motion artifact; normal heart/lung ratio. Stress Images:  Normal homogeneous uptake in all areas of the myocardium. Rest Images:  Normal homogeneous uptake in all areas of the myocardium. Subtraction (SDS):  No evidence of ischemia. Transient Ischemic Dilatation (Normal <1.22):  1.04 Lung/Heart Ratio (Normal <0.45):  0.26  Quantitative Gated Spect Images QGS EDV:  86 ml QGS ESV:  34 ml  Impression Exercise Capacity:  Lexiscan with no exercise. BP Response:  Normal blood pressure response. Clinical Symptoms:  No significant symptoms noted. ECG Impression:  No significant ST segment change suggestive of ischemia. Comparison with Prior Nuclear Study: No previous nuclear study performed  Overall Impression:  Normal stress nuclear study.  No evidence of ischemia.  Normal LV function.   LV Ejection Fraction: 60%.  LV Wall Motion:  NL LV Function; NL Wall Motion.   [name], [name]., MD, [address] [facility] 03/15/2013, 5:37 PM Office - [phone] Pager [phone]

## 2013-03-15 NOTE — Telephone Encounter (Signed)
Called in Norco/Vicodin 5-325mg  #50  No refills Take 1 tablet every 6 hours as needed for pain. Eileen Stanford

## 2013-03-15 NOTE — Patient Instructions (Addendum)
Ankle Sprain  An ankle sprain is an injury to the strong, fibrous tissues (ligaments) that hold the bones of your ankle joint together.   CAUSES  An ankle sprain is usually caused by a fall or by twisting your ankle. Ankle sprains most commonly occur when you step on the outer edge of your foot, and your ankle turns inward. People who participate in sports are more prone to these types of injuries.   SYMPTOMS    Pain in your ankle. The pain may be present at rest or only when you are trying to stand or walk.   Swelling.   Bruising. Bruising may develop immediately or within 1 to 2 days after your injury.   Difficulty standing or walking, particularly when turning corners or changing directions.  DIAGNOSIS   Your caregiver will ask you details about your injury and perform a physical exam of your ankle to determine if you have an ankle sprain. During the physical exam, your caregiver will press on and apply pressure to specific areas of your foot and ankle. Your caregiver will try to move your ankle in certain ways. An X-ray exam may be done to be sure a bone was not broken or a ligament did not separate from one of the bones in your ankle (avulsion fracture).   TREATMENT   Certain types of braces can help stabilize your ankle. Your caregiver can make a recommendation for this. Your caregiver may recommend the use of medicine for pain. If your sprain is severe, your caregiver may refer you to a surgeon who helps to restore function to parts of your skeletal system (orthopedist) or a physical therapist.  HOME CARE INSTRUCTIONS    Apply ice to your injury for 1 to 2 days or as directed by your caregiver. Applying ice helps to reduce inflammation and pain.   Put ice in a plastic bag.   Place a towel between your skin and the bag.   Leave the ice on for 15 to 20 minutes at a time, every 2 hours while you are awake.   Only take over-the-counter or prescription medicines for pain, discomfort, or fever as directed  by your caregiver.   Keep your injured leg elevated, when possible, to lessen swelling.   If your caregiver recommends crutches, use them as instructed. Gradually put weight on the affected ankle. Continue to use crutches or a cane until you can walk without feeling pain in your ankle.   If you have a plaster splint, wear the splint as directed by your caregiver. Do not rest it on anything harder than a pillow for the first 24 hours. Do not put weight on it. Do not get it wet. You may take it off to take a shower or bath.   You may have been given an elastic bandage to wear around your ankle to provide support. If the elastic bandage is too tight (you have numbness or tingling in your foot or your foot becomes cold and blue), adjust the bandage to make it comfortable.   If you have an air splint, you may blow more air into it or let air out to make it more comfortable. You may take your splint off at night and before taking a shower or bath.   Wiggle your toes in the splint several times per day to decrease swelling.  SEEK MEDICAL CARE IF:    You have an increase in bruising, swelling, or pain.   Your toes feel extremely cold   or you lose feeling in your foot.   Your pain is not relieved with medicine.  SEEK IMMEDIATE MEDICAL CARE IF:   Your toes are numb or blue.   You have severe pain.  MAKE SURE YOU:    Understand these instructions.   Will watch your condition.   Will get help right away if you are not doing well or get worse.  Document Released: 11/10/2005 Document Revised: 02/02/2012 Document Reviewed: 11/22/2011  [employer] Patient Information 2013 [employer].

## 2013-03-15 NOTE — Progress Notes (Signed)
Subjective:    Patient ID: [name], female    DOB: July 17, 1932, 79 y.o.   MRN: [id number] Chief Complaint  Patient presents with  . Ankle Injury    right foot x today    HPI  Was rushing to get out of the house.  Walked around car and heard pop when she was twisting her foot while turning a corner and then put down right foot w/ severe pain.  Pain is most posterior over heal and through lateral ankle - was about 2 hrs prev.  She takes hydrocodone for pain but she is out as her doctor was out of office. Has RA and freq falls, 17 stictches in face recently from fall, looses balance freq - they don't know why, has 16 doctors (2 retired) - "every specialist you can imagine".  Ues rolling walker constantly - cannot walk w/o support even at baseline. In addition to RA, has spinal stenosis in L4-5 cauing numbness in Lt>Rt legs.   Past Medical History  Diagnosis Date  . CORONARY ATHEROSCLEROSIS, ARTERY BYPASS GRAFT 08/23/2008  . DEPRESSION 05/21/2007  . FIBROCYSTIC BREAST DISEASE, HX OF 05/21/2007  . HYPERLIPIDEMIA-MIXED 11/26/2008  . Irritable bowel syndrome 05/21/2007  . KNEE PAIN 09/16/2007  . LEG EDEMA, BILATERAL 05/21/2009  . OSTEOARTHRITIS 01/20/2008  . OSTEOPENIA 05/21/2007  . URTICARIA, CHRONIC 05/21/2007  . DJD (degenerative joint disease)     history of   . History of aortic aneurysm     resection and grafting of a 5.2-cm ascending aortic arch aneurysm August 2009  . Dyslipidemia   . Vitamin D deficiency   . Urticaria   . Rheumatoid arthritis   . Neuropathy     left radial  neuropathy with wrist drop  . CAD (coronary artery disease)     s/p CABG -  x 1 with LIMA to LAD August 2009 /   . Diverticulosis   . Irritable bowel syndrome   . GERD (gastroesophageal reflux disease)   . HYPERTENSION 05/11/2008    seen on 01/23/2012, by Dr. Tenny Craw, will have ECHO- 01/27/2012  . 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 Vaginal yeast infection     recently told to use monistat, hasn't  started as of 01/26/2012   Current Outpatient Prescriptions on File Prior to Visit  Medication Sig Dispense Refill  . aspirin 81 MG tablet Take 81 mg by mouth daily.        . cetirizine (ZYRTEC) 10 MG tablet Take 10 mg by mouth daily as needed. Alternating with Hydroxyzine  For allergies      . folic acid (FOLVITE) 1 MG tablet Take 1 mg by mouth 2 (two) times daily.        . hydrochlorothiazide (HYDRODIURIL) 25 MG tablet Take 25 mg by mouth daily.      . hydroxychloroquine (PLAQUENIL) 200 MG tablet Take 200 mg by mouth 2 (two) times daily.       . hydrOXYzine (ATARAX/VISTARIL) 25 MG tablet Take 25 mg by mouth every 4 (four) hours as needed. For allergies alternate with Zyrtec      . metoprolol succinate (TOPROL-XL) 50 MG 24 hr tablet Take 50 mg by mouth daily.       . potassium chloride SA (KLOR-CON M20) 20 MEQ tablet Take 20 mEq by mouth 2 (two) times a week. As needed with Furosemide      . simvastatin (ZOCOR) 40 MG tablet Take 40 mg by mouth at bedtime.       Marland Kitchen  calcium carbonate (TUMS - DOSED IN MG ELEMENTAL CALCIUM) 500 MG chewable tablet Chew 2 tablets by mouth at bedtime.      . clindamycin (CLEOCIN) 150 MG capsule Take 1 capsule (150 mg total) by mouth 3 (three) times daily.  21 capsule  0  . furosemide (LASIX) 40 MG tablet TAKE 1 TABLET BY MOUTH TWICE A WEEK AS DIRECTED  90 tablet  1  . tobramycin-dexamethasone (TOBRADEX) ophthalmic solution        No current facility-administered medications on file prior to visit.   Allergies  Allergen Reactions  . Amoxicillin     REACTION: unspecified  . Cyclobenzaprine Hcl     REACTION: rash  . Penicillins Hives  . Tetanus Toxoid Hives     Review of Systems  Constitutional: Positive for activity change. Negative for fever, chills and unexpected weight change.  Cardiovascular: Positive for leg swelling.  Musculoskeletal: Positive for myalgias, back pain, joint swelling, arthralgias and gait problem.  Skin: Positive for color change.  Negative for pallor, rash and wound.  Neurological: Positive for weakness and numbness.  Hematological: Negative for adenopathy. Bruises/bleeds easily.      BP 154/62  Pulse 89  Temp(Src) 98.5 F (36.9 C) (Oral)  Resp 16  Ht 5\' 3"  (1.6 m)  Wt 179 lb (81.194 kg)  BMI 31.72 kg/m2  SpO2 96% Objective:   Physical Exam  Constitutional: She is oriented to person, place, and time. She appears well-developed and well-nourished. No distress.  HENT:  Head: Normocephalic and atraumatic.  Right Ear: External ear normal.  Eyes: Conjunctivae are normal. No scleral icterus.  Cardiovascular:  Pulses:      Dorsalis pedis pulses are 2+ on the right side.       Posterior tibial pulses are 2+ on the right side.  Severe bilateral varicose veins  Pulmonary/Chest: Effort normal.  Musculoskeletal:       Right ankle: She exhibits swelling and ecchymosis. She exhibits normal range of motion and normal pulse. Tenderness. AITFL and head of 5th metatarsal tenderness found. No lateral malleolus, no medial malleolus, no CF ligament, no posterior TFL and no proximal fibula tenderness found. Achilles tendon normal. Achilles tendon exhibits no pain, no defect and normal Thompson's test results.       Right foot: She exhibits normal range of motion, no bony tenderness, no swelling and normal capillary refill.  Neurological: She is alert and oriented to person, place, and time.  Skin: Skin is warm and dry. She is not diaphoretic. No erythema.  Psychiatric: She has a normal mood and affect. Her behavior is normal.     [facility] reading (PRIMARY) by  Dr. Clelia Croft. Rt ankle and foot: Sig mid-foot arthritic change. No acute bony abnormality seen.   Assessment & Plan:  Lateral ankle sprain - placed in tall cam walker for stability. Recheck in 1 wk but sooner if severe pain and debility continues.  Refilled hydrocodone #50, no refills.  May need PT referral at f/u.  Advised mainly use rolling walker w/ seat as wheelchair for  2-3d while recovering from acute injury.  Meds ordered this encounter  Medications  . Methotrexate Sodium (METHOTREXATE PO)    Sig: Take by mouth.  Marland Kitchen HYDROcodone-acetaminophen (NORCO/VICODIN) 5-325 MG per tablet    Sig: Take 1 tablet by mouth every 6 (six) hours as needed for pain.    Dispense:  50 tablet    Refill:  0

## 2013-03-18 ENCOUNTER — Other Ambulatory Visit: Payer: Self-pay | Admitting: Neurosurgery

## 2013-04-06 ENCOUNTER — Other Ambulatory Visit: Payer: Self-pay | Admitting: Internal Medicine

## 2013-04-11 ENCOUNTER — Other Ambulatory Visit: Payer: Self-pay | Admitting: Family Medicine

## 2013-04-15 ENCOUNTER — Other Ambulatory Visit: Payer: Self-pay | Admitting: *Deleted

## 2013-04-15 MED ORDER — HYDROCODONE-ACETAMINOPHEN 5-325 MG PO TABS
1.0000 | ORAL_TABLET | Freq: Four times a day (QID) | ORAL | Status: DC | PRN
Start: 2013-04-15 — End: 2013-05-09

## 2013-04-26 ENCOUNTER — Encounter (HOSPITAL_BASED_OUTPATIENT_CLINIC_OR_DEPARTMENT_OTHER): Payer: Medicare Other | Attending: General Surgery

## 2013-04-26 DIAGNOSIS — L97909 Non-pressure chronic ulcer of unspecified part of unspecified lower leg with unspecified severity: Secondary | ICD-10-CM | POA: Insufficient documentation

## 2013-05-03 ENCOUNTER — Ambulatory Visit (INDEPENDENT_AMBULATORY_CARE_PROVIDER_SITE_OTHER): Payer: Medicare Other | Admitting: Internal Medicine

## 2013-05-03 ENCOUNTER — Encounter: Payer: Self-pay | Admitting: Internal Medicine

## 2013-05-03 ENCOUNTER — Other Ambulatory Visit (HOSPITAL_COMMUNITY): Payer: Medicare Other

## 2013-05-03 VITALS — BP 130/62 | HR 93 | Temp 98.2°F | Resp 20 | Wt 180.0 lb

## 2013-05-03 DIAGNOSIS — R269 Unspecified abnormalities of gait and mobility: Secondary | ICD-10-CM

## 2013-05-03 DIAGNOSIS — R2681 Unsteadiness on feet: Secondary | ICD-10-CM

## 2013-05-03 DIAGNOSIS — M1990 Unspecified osteoarthritis, unspecified site: Secondary | ICD-10-CM

## 2013-05-03 DIAGNOSIS — I2510 Atherosclerotic heart disease of native coronary artery without angina pectoris: Secondary | ICD-10-CM

## 2013-05-03 NOTE — Patient Instructions (Signed)
Limit your sodium (Salt) intake  You need to lose weight.  Consider a lower calorie diet and regular exercise.  Return in 6 months for follow-up   

## 2013-05-03 NOTE — Progress Notes (Signed)
Subjective:    Patient ID: [name], female    DOB: 11/18/1932, 79 y.o.   MRN: [id number]  HPI  79 year old patient who is seen today for followup. She is followed by multiple physicians. She has history of RA as well as osteoarthritis. She is planning on lumbar decompression on August 18 do to lumbar spinal stenosis. She has chronic low back pain. Medical problems include dyslipidemia hypertension coronary artery disease. Denies any exertional chest pain but is quite inactive due to arthritis and unsteady gait. She continues to be plagued by frequent falls. She has a history of chronic venous insufficiency and has been treated actively at the wound clinic for venous stasis with ulceration. In general doing about the same.  Past Medical History  Diagnosis Date  . CORONARY ATHEROSCLEROSIS, ARTERY BYPASS GRAFT 08/23/2008  . DEPRESSION 05/21/2007  . FIBROCYSTIC BREAST DISEASE, HX OF 05/21/2007  . HYPERLIPIDEMIA-MIXED 11/26/2008  . Irritable bowel syndrome 05/21/2007  . KNEE PAIN 09/16/2007  . LEG EDEMA, BILATERAL 05/21/2009  . OSTEOARTHRITIS 01/20/2008  . OSTEOPENIA 05/21/2007  . URTICARIA, CHRONIC 05/21/2007  . DJD (degenerative joint disease)     history of   . History of aortic aneurysm     resection and grafting of a 5.2-cm ascending aortic arch aneurysm August 2009  . Dyslipidemia   . Vitamin D deficiency   . Urticaria   . Rheumatoid arthritis(714.0)   . Neuropathy     left radial  neuropathy with wrist drop  . CAD (coronary artery disease)     s/p CABG -  x 1 with LIMA to LAD August 2009 /   . Diverticulosis   . Irritable bowel syndrome   . GERD (gastroesophageal reflux disease)   . HYPERTENSION 05/11/2008    seen on 01/23/2012, by Dr. Tenny Craw, will have ECHO- 01/27/2012  . 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 Vaginal yeast infection     recently told to use monistat, hasn't started as of 01/26/2012    History   Social History  . Marital Status: Widowed    Spouse Name:  N/A    Number of Children: N/A  . Years of Education: N/A   Occupational History  . Not on file.   Social History Main Topics  . Smoking status: Never Smoker   . Smokeless tobacco: Not on file  . Alcohol Use: No  . Drug Use: No  . Sexually Active: Not on file   Other Topics Concern  . Not on file   Social History Narrative  . No narrative on file    Past Surgical History  Procedure Laterality Date  . Appendectomy    . Cesarean section    . Hernia repair    . Coronary artery bypass graft  August 2009    CABG x 1 with LIMA to LAD /  . Ascending aortic aneurysm repair  August 2009     resection and grafting of a 5.2-cm ascending aortic arch aneurysm  . Joint replacement      2009 & 2010- both knees  . Back surgery      2011- cerv. fusion   . Tonsillectomy      as a child  . Breast surgery      biopsy x2- R breast    Family History  Problem Relation Age of Onset  . Heart disease    . Coronary artery disease    . Anesthesia problems Neg Hx   .  Hypotension Neg Hx   . Malignant hyperthermia Neg Hx   . Pseudochol deficiency Neg Hx   . Heart disease Mother   . Stroke Father   . Cancer Maternal Grandmother   . Stroke Paternal Grandmother     Allergies  Allergen Reactions  . Amoxicillin     REACTION: unspecified  . Cyclobenzaprine Hcl     REACTION: rash  . Penicillins Hives  . Tetanus Toxoid Hives    Current Outpatient Prescriptions on File Prior to Visit  Medication Sig Dispense Refill  . calcium carbonate (TUMS - DOSED IN MG ELEMENTAL CALCIUM) 500 MG chewable tablet Chew 2 tablets by mouth at bedtime.      . cetirizine (ZYRTEC) 10 MG tablet Take 10 mg by mouth daily as needed. Alternating with Hydroxyzine  For allergies      . clindamycin (CLEOCIN) 150 MG capsule Take 1 capsule (150 mg total) by mouth 3 (three) times daily.  21 capsule  0  . folic acid (FOLVITE) 1 MG tablet Take 1 mg by mouth 2 (two) times daily.        . furosemide (LASIX) 40 MG tablet  TAKE 1 TABLET BY MOUTH TWICE A WEEK AS DIRECTED  90 tablet  1  . hydrochlorothiazide (HYDRODIURIL) 25 MG tablet Take 25 mg by mouth daily.      Marland Kitchen HYDROcodone-acetaminophen (NORCO/VICODIN) 5-325 MG per tablet Take 1 tablet by mouth every 6 (six) hours as needed for pain.  50 tablet  0  . hydroxychloroquine (PLAQUENIL) 200 MG tablet Take 200 mg by mouth 2 (two) times daily.       . hydrOXYzine (ATARAX/VISTARIL) 25 MG tablet Take 25 mg by mouth every 4 (four) hours as needed. For allergies alternate with Zyrtec      . Methotrexate Sodium (METHOTREXATE PO) Take by mouth.      . metoprolol succinate (TOPROL-XL) 50 MG 24 hr tablet Take 50 mg by mouth daily.       . potassium chloride SA (KLOR-CON M20) 20 MEQ tablet Take 20 mEq by mouth 2 (two) times a week. As needed with Furosemide      . simvastatin (ZOCOR) 40 MG tablet Take 40 mg by mouth at bedtime.       Marland Kitchen tobramycin-dexamethasone (TOBRADEX) ophthalmic solution       . aspirin 81 MG tablet Take 81 mg by mouth daily.         No current facility-administered medications on file prior to visit.    BP 130/62  Pulse 93  Temp(Src) 98.2 F (36.8 C) (Oral)  Resp 20  Wt 180 lb (81.647 kg)  BMI 31.89 kg/m2  SpO2 94%       Review of Systems  Constitutional: Negative.   HENT: Negative for hearing loss, congestion, sore throat, rhinorrhea, dental problem, sinus pressure and tinnitus.   Eyes: Negative for pain, discharge and visual disturbance.  Respiratory: Negative for cough and shortness of breath.   Cardiovascular: Positive for leg swelling. Negative for chest pain and palpitations.  Gastrointestinal: Negative for nausea, vomiting, abdominal pain, diarrhea, constipation, blood in stool and abdominal distention.  Genitourinary: Negative for dysuria, urgency, frequency, hematuria, flank pain, vaginal bleeding, vaginal discharge, difficulty urinating, vaginal pain and pelvic pain.  Musculoskeletal: Positive for back pain, arthralgias and gait  problem. Negative for joint swelling.  Skin: Positive for wound. Negative for rash.  Neurological: Negative for dizziness, syncope, speech difficulty, weakness, numbness and headaches.  Hematological: Negative for adenopathy.  Psychiatric/Behavioral: Negative for behavioral problems,  dysphoric mood and agitation. The patient is not nervous/anxious.        Objective:   Physical Exam  Constitutional: She is oriented to person, place, and time. She appears well-developed and well-nourished.  Normal blood pressure Able to transfer from wheelchair to the examining table with minimal assistance Gait was unsteady  HENT:  Head: Normocephalic.  Right Ear: External ear normal.  Left Ear: External ear normal.  Mouth/Throat: Oropharynx is clear and moist.  Eyes: Conjunctivae and EOM are normal. Pupils are equal, round, and reactive to light.  Neck: Normal range of motion. Neck supple. No thyromegaly present.  Cardiovascular: Normal rate, regular rhythm, normal heart sounds and intact distal pulses.   Pulmonary/Chest: Effort normal and breath sounds normal.  Abdominal: Soft. Bowel sounds are normal. She exhibits no mass. There is no tenderness.  Musculoskeletal: Normal range of motion.  Lymphadenopathy:    She has no cervical adenopathy.  Neurological: She is alert and oriented to person, place, and time.  Skin: Skin is warm and dry. No rash noted.  Lower extremities and feet wrapped. No significant edema  Psychiatric: She has a normal mood and affect. Her behavior is normal.          Assessment & Plan:  Hypertension stable blood pressure well controlled Coronary artery disease stable Rheumatoid arthritis Lumbar spinal stenosis.For laminectomy in August  Recheck 6 months

## 2013-05-05 ENCOUNTER — Encounter (HOSPITAL_COMMUNITY): Admission: RE | Admit: 2013-05-05 | Payer: Medicare Other | Source: Ambulatory Visit

## 2013-05-09 ENCOUNTER — Other Ambulatory Visit: Payer: Self-pay | Admitting: Internal Medicine

## 2013-05-25 ENCOUNTER — Encounter (HOSPITAL_BASED_OUTPATIENT_CLINIC_OR_DEPARTMENT_OTHER): Payer: Medicare Other | Attending: General Surgery

## 2013-05-25 DIAGNOSIS — I87309 Chronic venous hypertension (idiopathic) without complications of unspecified lower extremity: Secondary | ICD-10-CM | POA: Insufficient documentation

## 2013-05-25 DIAGNOSIS — L97809 Non-pressure chronic ulcer of other part of unspecified lower leg with unspecified severity: Secondary | ICD-10-CM | POA: Insufficient documentation

## 2013-05-25 DIAGNOSIS — I872 Venous insufficiency (chronic) (peripheral): Secondary | ICD-10-CM | POA: Insufficient documentation

## 2013-05-30 ENCOUNTER — Other Ambulatory Visit: Payer: Self-pay | Admitting: Internal Medicine

## 2013-06-11 ENCOUNTER — Other Ambulatory Visit: Payer: Self-pay | Admitting: Internal Medicine

## 2013-06-23 ENCOUNTER — Other Ambulatory Visit: Payer: Self-pay | Admitting: Internal Medicine

## 2013-06-24 ENCOUNTER — Encounter (HOSPITAL_BASED_OUTPATIENT_CLINIC_OR_DEPARTMENT_OTHER): Payer: Medicare Other | Attending: General Surgery

## 2013-06-24 DIAGNOSIS — I87319 Chronic venous hypertension (idiopathic) with ulcer of unspecified lower extremity: Secondary | ICD-10-CM | POA: Insufficient documentation

## 2013-06-24 DIAGNOSIS — L97909 Non-pressure chronic ulcer of unspecified part of unspecified lower leg with unspecified severity: Secondary | ICD-10-CM | POA: Insufficient documentation

## 2013-06-29 ENCOUNTER — Encounter (HOSPITAL_BASED_OUTPATIENT_CLINIC_OR_DEPARTMENT_OTHER): Payer: Medicare Other

## 2013-07-04 ENCOUNTER — Encounter (HOSPITAL_COMMUNITY)
Admission: RE | Admit: 2013-07-04 | Discharge: 2013-07-04 | Disposition: A | Discharge status: Home / Self Care | Payer: Medicare Other | Source: Ambulatory Visit | Attending: Anesthesiology | Admitting: Anesthesiology

## 2013-07-04 ENCOUNTER — Encounter (HOSPITAL_COMMUNITY): Payer: Self-pay

## 2013-07-04 ENCOUNTER — Encounter (HOSPITAL_COMMUNITY): Payer: Self-pay | Admitting: Pharmacy Technician

## 2013-07-04 ENCOUNTER — Other Ambulatory Visit: Payer: Self-pay | Admitting: Neurosurgery

## 2013-07-04 ENCOUNTER — Encounter (HOSPITAL_COMMUNITY)
Admission: RE | Admit: 2013-07-04 | Discharge: 2013-07-04 | Disposition: A | Discharge status: Home / Self Care | Payer: Medicare Other | Source: Ambulatory Visit | Attending: Neurosurgery | Admitting: Neurosurgery

## 2013-07-04 DIAGNOSIS — Z01818 Encounter for other preprocedural examination: Secondary | ICD-10-CM | POA: Insufficient documentation

## 2013-07-04 DIAGNOSIS — Z01812 Encounter for preprocedural laboratory examination: Secondary | ICD-10-CM | POA: Insufficient documentation

## 2013-07-04 HISTORY — DX: Unspecified asthma, uncomplicated: J45.909

## 2013-07-04 HISTORY — DX: Other chronic pain: G89.29

## 2013-07-04 HISTORY — DX: Anemia, unspecified: D64.9

## 2013-07-04 HISTORY — DX: Full incontinence of feces: R15.9

## 2013-07-04 HISTORY — DX: Other skin changes: R23.8

## 2013-07-04 HISTORY — DX: Frequency of micturition: R35.0

## 2013-07-04 HISTORY — DX: Urgency of urination: R39.15

## 2013-07-04 HISTORY — DX: Dorsalgia, unspecified: M54.9

## 2013-07-04 HISTORY — DX: Weakness: R53.1

## 2013-07-04 HISTORY — DX: Spontaneous ecchymoses: R23.3

## 2013-07-04 HISTORY — DX: Personal history of colonic polyps: Z86.010

## 2013-07-04 HISTORY — DX: Personal history of colon polyps, unspecified: Z86.0100

## 2013-07-04 HISTORY — DX: Localized edema: R60.0

## 2013-07-04 HISTORY — DX: Nocturia: R35.1

## 2013-07-04 HISTORY — DX: Sciatica, unspecified side: M54.30

## 2013-07-04 HISTORY — DX: Unspecified urinary incontinence: R32

## 2013-07-04 HISTORY — DX: Other seasonal allergic rhinitis: J30.2

## 2013-07-04 HISTORY — DX: Pneumonia, unspecified organism: J18.9

## 2013-07-04 HISTORY — DX: Edema, unspecified: R60.9

## 2013-07-04 LAB — CBC
MCV: 87.9 fL (ref 78.0–100.0)
Platelets: 209 10*3/uL (ref 150–400)
RBC: 3.8 MIL/uL — ABNORMAL LOW (ref 3.87–5.11)
RDW: 16.2 % — ABNORMAL HIGH (ref 11.5–15.5)
WBC: 7.3 10*3/uL (ref 4.0–10.5)

## 2013-07-04 LAB — BASIC METABOLIC PANEL
CO2: 25 mEq/L (ref 19–32)
Calcium: 9.5 mg/dL (ref 8.4–10.5)
Creatinine, Ser: 0.57 mg/dL (ref 0.50–1.10)
GFR calc Af Amer: >90 mL/min (ref >90)
GFR calc non Af Amer: 86 mL/min — ABNORMAL LOW (ref >90)
Sodium: 137 mEq/L (ref 135–145)

## 2013-07-04 NOTE — Progress Notes (Addendum)
Dr Dietrich Pates is cardiologist with last visit in Nov 2013  Echo reports in epic from 2009/2013  Stress test report in epic from 2014  Heart cath report in epic from 2009  EKG report in epic from 10-11-12  Medical Md is Dr.Kwiatkowski  Denies CXR in past yr

## 2013-07-04 NOTE — Progress Notes (Signed)
Spoke with Erie Noe requesting orders;also notified her that pt had an allergic reaction to Mupirocin a yr ago so I didn't swab her.However,I did put a call into Infection Control to see if I should have

## 2013-07-04 NOTE — Pre-Procedure Instructions (Signed)
[name]  07/04/2013   Your procedure is scheduled on:  Mon, Aug 18 @ 7:30 AM  Report to [facility] at 5:30 AM.  Call this number if you have problems the morning of surgery: [phone]   Remember:   Do not eat food or drink liquids after midnight.   Take these medicines the morning of surgery with A SIP OF WATER: Zyrtec(Cetirizine-if needed),Pain Pill(if needed),and Metoprolol(Toprol)              Stop taking Methotrexate,Aspirin,and Plaquenil.No Ibuprofen,Goody's,BC's,Aleve,Fish Oil,or any Herbal Medications   Do not wear jewelry, make-up or nail polish.  Do not wear lotions, powders, or perfumes. You may wear deodorant.  Do not shave 48 hours prior to surgery. Men may shave face and neck.  Do not bring valuables to the hospital.  [facility] is not responsible                   for any belongings or valuables.  Contacts, dentures or bridgework may not be worn into surgery.  Leave suitcase in the car. After surgery it may be brought to your room.  For patients admitted to the hospital, checkout time is 11:00 AM the day of  discharge.    Special Instructions: Shower using CHG 2 nights before surgery and the night before surgery.  If you shower the day of surgery use CHG.  Use special wash - you have one bottle of CHG for all showers.  You should use approximately 1/3 of the bottle for each shower.   Please read over the following fact sheets that you were given: Pain Booklet, Coughing and Deep Breathing, MRSA Information and Surgical Site Infection Prevention

## 2013-07-05 NOTE — Progress Notes (Signed)
Anesthesia Chart Review:  Patient is a 79 year old female scheduled for one level lumbar laminectomy/decompression microdiscectomy on 07/11/13 (orders pending) by Dr. Wynetta Emery.  History includes non-smoker, HLD, IBS, depression, dyslipidemia, GERD, HTN, asthma, peripheral edema, RA, CAD and ascending aortic aneursym s/p CABG X 1 (LIMA to LAD) and R&G of aneurysm with Dacron graft 07/20/08, anemia.  PCP is Dr. Amador Cunas.  Cardiologist is Dr. Tenny Craw who cleared her for surgery following a normal stress test 02/2013.  EKG on 10/11/12 showed NSR, cannot rule out anterior infarct (age undetermined).  Nuclear stress test on 03/16/13 showed: Normal stress nuclear study. No evidence of ischemia. Normal LV function. LV Ejection Fraction: 60%. LV Wall Motion: NL LV Function; NL Wall Motion.  Echo on 11/19/12 showed: - Left ventricle: The cavity size was normal. Wall thickness was increased in a pattern of mild LVH. The estimated ejection fraction was 65%. Wall motion was normal; there were no regional wall motion abnormalities. - Aortic valve: Sclerosis without stenosis. Mild to moderate regurgitation. The AI is central. - Aortic root: The aortic root was normal in size. The ascending aorta is not measured. I can not rule out dilitation of the ascending aorta. - Mitral valve: Trivial regurgitation. - Left atrium: The atrium was moderately dilated. - Right ventricle: The cavity size was normal. Systolic function was mildly reduced. - Pulmonic valve: Mild regurgitation. - Tricuspid valve: Trivial regurgitation. - Right atrium: The atrium was mildly dilated.  Cardiac cath on 07/04/08 (pre-CABG/TAA repair) showed: 1. Well-preserved left ventricular function.  2. Thoracic aortic aneurysm.  3. Mild gradient across the aortic valve.  4. A 50-70% mid-left anterior descending stenosis.  5. Approximate 40-50% diagonal stenosis.  6. No significant right coronary artery obstruction.  7. No significant circumflex  obstruction.   Preoperative CXR and labs noted.  Anticipate that she can proceed as planned.  Velna Ochs [facility]/Anesthesiology Phone [phone] 07/05/2013 11:27 AM

## 2013-07-06 NOTE — Progress Notes (Signed)
Spoke to [place] at Dr. Lonie Peak office to inform her he has not signed his orders.

## 2013-07-11 ENCOUNTER — Encounter (HOSPITAL_COMMUNITY): Payer: Self-pay | Admitting: Surgery

## 2013-07-11 ENCOUNTER — Ambulatory Visit (HOSPITAL_COMMUNITY): Payer: Medicare Other | Admitting: Anesthesiology

## 2013-07-11 ENCOUNTER — Inpatient Hospital Stay (HOSPITAL_COMMUNITY)
Admission: RE | Admit: 2013-07-11 | Discharge: 2013-07-20 | DRG: 490 | Disposition: A | Discharge status: Skilled Nursing Facility | Payer: Medicare Other | Source: Ambulatory Visit | Attending: Neurosurgery | Admitting: Neurosurgery

## 2013-07-11 ENCOUNTER — Ambulatory Visit (HOSPITAL_COMMUNITY): Payer: Medicare Other

## 2013-07-11 ENCOUNTER — Encounter (HOSPITAL_COMMUNITY)
Admission: RE | Disposition: A | Discharge status: Skilled Nursing Facility | Payer: Self-pay | Source: Ambulatory Visit | Attending: Neurosurgery

## 2013-07-11 ENCOUNTER — Encounter (HOSPITAL_COMMUNITY): Payer: Self-pay | Admitting: Vascular Surgery

## 2013-07-11 DIAGNOSIS — Z880 Allergy status to penicillin: Secondary | ICD-10-CM

## 2013-07-11 DIAGNOSIS — M48062 Spinal stenosis, lumbar region with neurogenic claudication: Secondary | ICD-10-CM | POA: Diagnosis present

## 2013-07-11 DIAGNOSIS — E559 Vitamin D deficiency, unspecified: Secondary | ICD-10-CM | POA: Diagnosis present

## 2013-07-11 DIAGNOSIS — Z96659 Presence of unspecified artificial knee joint: Secondary | ICD-10-CM

## 2013-07-11 DIAGNOSIS — M47817 Spondylosis without myelopathy or radiculopathy, lumbosacral region: Principal | ICD-10-CM | POA: Diagnosis present

## 2013-07-11 DIAGNOSIS — Z887 Allergy status to serum and vaccine status: Secondary | ICD-10-CM

## 2013-07-11 DIAGNOSIS — K219 Gastro-esophageal reflux disease without esophagitis: Secondary | ICD-10-CM | POA: Diagnosis present

## 2013-07-11 DIAGNOSIS — R32 Unspecified urinary incontinence: Secondary | ICD-10-CM | POA: Diagnosis not present

## 2013-07-11 DIAGNOSIS — F3289 Other specified depressive episodes: Secondary | ICD-10-CM | POA: Diagnosis present

## 2013-07-11 DIAGNOSIS — G5680 Other specified mononeuropathies of unspecified upper limb: Secondary | ICD-10-CM | POA: Diagnosis present

## 2013-07-11 DIAGNOSIS — Z860100 Personal history of colon polyps, unspecified: Secondary | ICD-10-CM

## 2013-07-11 DIAGNOSIS — E782 Mixed hyperlipidemia: Secondary | ICD-10-CM | POA: Diagnosis present

## 2013-07-11 DIAGNOSIS — IMO0002 Reserved for concepts with insufficient information to code with codable children: Secondary | ICD-10-CM | POA: Diagnosis present

## 2013-07-11 DIAGNOSIS — Z823 Family history of stroke: Secondary | ICD-10-CM

## 2013-07-11 DIAGNOSIS — Z79899 Other long term (current) drug therapy: Secondary | ICD-10-CM

## 2013-07-11 DIAGNOSIS — Z881 Allergy status to other antibiotic agents status: Secondary | ICD-10-CM

## 2013-07-11 DIAGNOSIS — M1990 Unspecified osteoarthritis, unspecified site: Secondary | ICD-10-CM | POA: Diagnosis present

## 2013-07-11 DIAGNOSIS — Z9089 Acquired absence of other organs: Secondary | ICD-10-CM

## 2013-07-11 DIAGNOSIS — Z951 Presence of aortocoronary bypass graft: Secondary | ICD-10-CM

## 2013-07-11 DIAGNOSIS — Z888 Allergy status to other drugs, medicaments and biological substances status: Secondary | ICD-10-CM

## 2013-07-11 DIAGNOSIS — M069 Rheumatoid arthritis, unspecified: Secondary | ICD-10-CM | POA: Diagnosis present

## 2013-07-11 DIAGNOSIS — G8929 Other chronic pain: Secondary | ICD-10-CM | POA: Diagnosis present

## 2013-07-11 DIAGNOSIS — F329 Major depressive disorder, single episode, unspecified: Secondary | ICD-10-CM | POA: Diagnosis present

## 2013-07-11 DIAGNOSIS — G9741 Accidental puncture or laceration of dura during a procedure: Secondary | ICD-10-CM | POA: Diagnosis not present

## 2013-07-11 DIAGNOSIS — I10 Essential (primary) hypertension: Secondary | ICD-10-CM | POA: Diagnosis present

## 2013-07-11 DIAGNOSIS — K5730 Diverticulosis of large intestine without perforation or abscess without bleeding: Secondary | ICD-10-CM | POA: Diagnosis present

## 2013-07-11 DIAGNOSIS — R3915 Urgency of urination: Secondary | ICD-10-CM | POA: Diagnosis not present

## 2013-07-11 DIAGNOSIS — K589 Irritable bowel syndrome without diarrhea: Secondary | ICD-10-CM | POA: Diagnosis present

## 2013-07-11 DIAGNOSIS — Z981 Arthrodesis status: Secondary | ICD-10-CM

## 2013-07-11 DIAGNOSIS — Z7982 Long term (current) use of aspirin: Secondary | ICD-10-CM

## 2013-07-11 DIAGNOSIS — I2510 Atherosclerotic heart disease of native coronary artery without angina pectoris: Secondary | ICD-10-CM | POA: Diagnosis present

## 2013-07-11 DIAGNOSIS — K5900 Constipation, unspecified: Secondary | ICD-10-CM | POA: Diagnosis not present

## 2013-07-11 HISTORY — PX: LUMBAR LAMINECTOMY/DECOMPRESSION MICRODISCECTOMY: SHX5026

## 2013-07-11 SURGERY — LUMBAR LAMINECTOMY/DECOMPRESSION MICRODISCECTOMY 1 LEVEL
Anesthesia: General | Site: Back | Laterality: Bilateral | Wound class: Clean

## 2013-07-11 MED ORDER — METHOTREXATE 2.5 MG PO TABS
15.0000 mg | ORAL_TABLET | ORAL | Status: DC
  Administered 2013-07-11 – 2013-07-18 (×2): 15 mg via ORAL
  Filled 2013-07-11 (×2): qty 6

## 2013-07-11 MED ORDER — THROMBIN 5000 UNITS EX SOLR
CUTANEOUS | Status: DC | PRN
  Administered 2013-07-11 (×2): 5000 [IU] via TOPICAL

## 2013-07-11 MED ORDER — VANCOMYCIN HCL IN DEXTROSE 1-5 GM/200ML-% IV SOLN
1000.0000 mg | Freq: Once | INTRAVENOUS | Status: AC
  Administered 2013-07-11: 1000 mg via INTRAVENOUS
  Filled 2013-07-11 (×2): qty 200

## 2013-07-11 MED ORDER — SODIUM CHLORIDE 0.9 % IJ SOLN
3.0000 mL | Freq: Two times a day (BID) | INTRAMUSCULAR | Status: DC
  Administered 2013-07-11 – 2013-07-16 (×10): 3 mL via INTRAVENOUS

## 2013-07-11 MED ORDER — CYCLOBENZAPRINE HCL 10 MG PO TABS: 10.0000 mg | ORAL_TABLET | Freq: Three times a day (TID) | ORAL | Status: DC | PRN

## 2013-07-11 MED ORDER — ROCURONIUM BROMIDE 100 MG/10ML IV SOLN
INTRAVENOUS | Status: DC | PRN
  Administered 2013-07-11: 10 mg via INTRAVENOUS
  Administered 2013-07-11: 40 mg via INTRAVENOUS

## 2013-07-11 MED ORDER — SODIUM CHLORIDE 0.9 % IR SOLN
Status: DC | PRN
  Administered 2013-07-11: 1000 mL

## 2013-07-11 MED ORDER — CALCIUM CARBONATE ANTACID 500 MG PO CHEW
2.0000 | CHEWABLE_TABLET | Freq: Two times a day (BID) | ORAL | Status: DC
  Administered 2013-07-11 – 2013-07-20 (×19): 400 mg via ORAL
  Filled 2013-07-11 (×21): qty 2

## 2013-07-11 MED ORDER — ARTIFICIAL TEARS OP OINT
TOPICAL_OINTMENT | OPHTHALMIC | Status: DC | PRN
  Administered 2013-07-11: 1 via OPHTHALMIC

## 2013-07-11 MED ORDER — MENTHOL 3 MG MT LOZG
1.0000 | LOZENGE | OROMUCOSAL | Status: DC | PRN
  Administered 2013-07-12: 3 mg via ORAL
  Filled 2013-07-11: qty 9

## 2013-07-11 MED ORDER — SODIUM CHLORIDE 0.9 % IR SOLN
Status: DC | PRN
  Administered 2013-07-11: 08:00:00

## 2013-07-11 MED ORDER — PROPOFOL 10 MG/ML IV BOLUS
INTRAVENOUS | Status: DC | PRN
  Administered 2013-07-11: 150 mg via INTRAVENOUS

## 2013-07-11 MED ORDER — HYDROMORPHONE HCL PF 1 MG/ML IJ SOLN
0.5000 mg | INTRAMUSCULAR | Status: DC | PRN
  Administered 2013-07-11 – 2013-07-13 (×3): 1 mg via INTRAVENOUS
  Filled 2013-07-11 (×3): qty 1

## 2013-07-11 MED ORDER — ACETAMINOPHEN 650 MG RE SUPP: 650.0000 mg | RECTAL | Status: DC | PRN

## 2013-07-11 MED ORDER — LACTATED RINGERS IV SOLN
INTRAVENOUS | Status: DC | PRN
  Administered 2013-07-11 (×2): via INTRAVENOUS

## 2013-07-11 MED ORDER — POTASSIUM CHLORIDE CRYS ER 20 MEQ PO TBCR
20.0000 meq | EXTENDED_RELEASE_TABLET | ORAL | Status: DC
  Administered 2013-07-14 – 2013-07-18 (×2): 20 meq via ORAL
  Filled 2013-07-11 (×4): qty 1

## 2013-07-11 MED ORDER — HEMOSTATIC AGENTS (NO CHARGE) OPTIME
TOPICAL | Status: DC | PRN
  Administered 2013-07-11: 1 via TOPICAL

## 2013-07-11 MED ORDER — LIDOCAINE-EPINEPHRINE 1 %-1:100000 IJ SOLN
INTRAMUSCULAR | Status: DC | PRN
  Administered 2013-07-11: 10 mL

## 2013-07-11 MED ORDER — HYDROXYCHLOROQUINE SULFATE 200 MG PO TABS
200.0000 mg | ORAL_TABLET | Freq: Two times a day (BID) | ORAL | Status: DC
  Administered 2013-07-11 – 2013-07-20 (×19): 200 mg via ORAL
  Filled 2013-07-11 (×23): qty 1

## 2013-07-11 MED ORDER — BUPIVACAINE HCL (PF) 0.25 % IJ SOLN
INTRAMUSCULAR | Status: DC | PRN
  Administered 2013-07-11: 10 mL

## 2013-07-11 MED ORDER — DOCUSATE SODIUM 100 MG PO CAPS
100.0000 mg | ORAL_CAPSULE | Freq: Two times a day (BID) | ORAL | Status: DC
  Administered 2013-07-13 – 2013-07-20 (×13): 100 mg via ORAL
  Filled 2013-07-11 (×15): qty 1

## 2013-07-11 MED ORDER — FUROSEMIDE 40 MG PO TABS
40.0000 mg | ORAL_TABLET | ORAL | Status: DC
  Administered 2013-07-14 – 2013-07-18 (×2): 40 mg via ORAL
  Filled 2013-07-11 (×4): qty 1

## 2013-07-11 MED ORDER — ACETAMINOPHEN 325 MG PO TABS
650.0000 mg | ORAL_TABLET | ORAL | Status: DC | PRN
  Administered 2013-07-17: 650 mg via ORAL
  Filled 2013-07-11: qty 2

## 2013-07-11 MED ORDER — SIMVASTATIN 40 MG PO TABS
40.0000 mg | ORAL_TABLET | Freq: Every day | ORAL | Status: DC
  Administered 2013-07-11 – 2013-07-19 (×9): 40 mg via ORAL
  Filled 2013-07-11 (×11): qty 1

## 2013-07-11 MED ORDER — METOPROLOL SUCCINATE ER 50 MG PO TB24
50.0000 mg | ORAL_TABLET | Freq: Every day | ORAL | Status: DC
  Administered 2013-07-12 – 2013-07-20 (×9): 50 mg via ORAL
  Filled 2013-07-11 (×9): qty 1

## 2013-07-11 MED ORDER — ONDANSETRON HCL 4 MG/2ML IJ SOLN: 4.0000 mg | INTRAMUSCULAR | Status: DC | PRN

## 2013-07-11 MED ORDER — ONDANSETRON HCL 4 MG/2ML IJ SOLN
INTRAMUSCULAR | Status: DC | PRN
  Administered 2013-07-11: 4 mg via INTRAVENOUS

## 2013-07-11 MED ORDER — TOBRAMYCIN-DEXAMETHASONE 0.3-0.1 % OP SUSP: 1.0000 [drp] | OPHTHALMIC | Status: DC

## 2013-07-11 MED ORDER — SODIUM CHLORIDE 0.9 % IV SOLN
INTRAVENOUS | Status: AC
  Filled 2013-07-11: qty 500

## 2013-07-11 MED ORDER — GLYCOPYRROLATE 0.2 MG/ML IJ SOLN
INTRAMUSCULAR | Status: DC | PRN
  Administered 2013-07-11: 0.6 mg via INTRAVENOUS

## 2013-07-11 MED ORDER — PHENOL 1.4 % MT LIQD: 1.0000 | OROMUCOSAL | Status: DC | PRN

## 2013-07-11 MED ORDER — HYDROCHLOROTHIAZIDE 25 MG PO TABS
25.0000 mg | ORAL_TABLET | Freq: Every day | ORAL | Status: DC
  Administered 2013-07-11 – 2013-07-20 (×10): 25 mg via ORAL
  Filled 2013-07-11 (×10): qty 1

## 2013-07-11 MED ORDER — METHOCARBAMOL 500 MG PO TABS
500.0000 mg | ORAL_TABLET | Freq: Four times a day (QID) | ORAL | Status: DC | PRN
  Administered 2013-07-11 – 2013-07-13 (×6): 500 mg via ORAL
  Filled 2013-07-11 (×6): qty 1

## 2013-07-11 MED ORDER — LIDOCAINE HCL (CARDIAC) 20 MG/ML IV SOLN
INTRAVENOUS | Status: DC | PRN
  Administered 2013-07-11: 80 mg via INTRAVENOUS

## 2013-07-11 MED ORDER — HYDROMORPHONE HCL PF 1 MG/ML IJ SOLN: 0.2500 mg | INTRAMUSCULAR | Status: DC | PRN

## 2013-07-11 MED ORDER — BACITRACIN 50000 UNITS IM SOLR
INTRAMUSCULAR | Status: AC
  Filled 2013-07-11: qty 1

## 2013-07-11 MED ORDER — FENTANYL CITRATE 0.05 MG/ML IJ SOLN
INTRAMUSCULAR | Status: DC | PRN
  Administered 2013-07-11 (×2): 50 ug via INTRAVENOUS

## 2013-07-11 MED ORDER — NEOSTIGMINE METHYLSULFATE 1 MG/ML IJ SOLN
INTRAMUSCULAR | Status: DC | PRN
  Administered 2013-07-11: 4 mg via INTRAVENOUS

## 2013-07-11 MED ORDER — SODIUM CHLORIDE 0.9 % IJ SOLN
3.0000 mL | INTRAMUSCULAR | Status: DC | PRN
  Administered 2013-07-16: 3 mL via INTRAVENOUS

## 2013-07-11 MED ORDER — HYDROCODONE-ACETAMINOPHEN 5-325 MG PO TABS
1.0000 | ORAL_TABLET | Freq: Four times a day (QID) | ORAL | Status: DC | PRN
  Administered 2013-07-11 – 2013-07-13 (×5): 1 via ORAL
  Filled 2013-07-11 (×5): qty 1

## 2013-07-11 MED ORDER — CEFAZOLIN SODIUM 1-5 GM-% IV SOLN: 1.0000 g | Freq: Three times a day (TID) | INTRAVENOUS | Status: DC

## 2013-07-11 MED ORDER — LORATADINE 10 MG PO TABS
10.0000 mg | ORAL_TABLET | Freq: Every day | ORAL | Status: DC
  Administered 2013-07-11 – 2013-07-20 (×9): 10 mg via ORAL
  Filled 2013-07-11 (×10): qty 1

## 2013-07-11 MED ORDER — HYDROXYZINE HCL 25 MG PO TABS
25.0000 mg | ORAL_TABLET | ORAL | Status: DC | PRN
  Administered 2013-07-15 – 2013-07-20 (×6): 25 mg via ORAL
  Filled 2013-07-11 (×6): qty 1

## 2013-07-11 MED ORDER — FOLIC ACID 1 MG PO TABS
1.0000 mg | ORAL_TABLET | Freq: Two times a day (BID) | ORAL | Status: DC
  Administered 2013-07-11 – 2013-07-20 (×19): 1 mg via ORAL
  Filled 2013-07-11 (×21): qty 1

## 2013-07-11 MED ORDER — VANCOMYCIN HCL IN DEXTROSE 1-5 GM/200ML-% IV SOLN
INTRAVENOUS | Status: AC
  Administered 2013-07-11: 1000 mg via INTRAVENOUS
  Filled 2013-07-11: qty 200

## 2013-07-11 MED ORDER — ALUM & MAG HYDROXIDE-SIMETH 200-200-20 MG/5ML PO SUSP: 30.0000 mL | Freq: Four times a day (QID) | ORAL | Status: DC | PRN

## 2013-07-11 MED ORDER — ASPIRIN 81 MG PO CHEW
81.0000 mg | CHEWABLE_TABLET | Freq: Every day | ORAL | Status: DC
  Administered 2013-07-11 – 2013-07-20 (×10): 81 mg via ORAL
  Filled 2013-07-11 (×11): qty 1

## 2013-07-11 SURGICAL SUPPLY — 62 items
ADH SKN CLS APL DERMABOND .7 (GAUZE/BANDAGES/DRESSINGS) ×1
APL SKNCLS STERI-STRIP NONHPOA (GAUZE/BANDAGES/DRESSINGS) ×1
APL SRG 60D 8 XTD TIP BNDBL (TIP) ×1
BAG DECANTER FOR FLEXI CONT (MISCELLANEOUS) ×2 IMPLANT
BENZOIN TINCTURE PRP APPL 2/3 (GAUZE/BANDAGES/DRESSINGS) ×2 IMPLANT
BLADE SURG 11 STRL SS (BLADE) ×2 IMPLANT
BLADE SURG ROTATE 9660 (MISCELLANEOUS) IMPLANT
BRUSH SCRUB EZ PLAIN DRY (MISCELLANEOUS) ×2 IMPLANT
BUR MATCHSTICK NEURO 3.0 LAGG (BURR) ×2 IMPLANT
BUR PRECISION FLUTE 6.0 (BURR) ×2 IMPLANT
CANISTER SUCTION 2500CC (MISCELLANEOUS) ×2 IMPLANT
CLOTH BEACON ORANGE TIMEOUT ST (SAFETY) ×2 IMPLANT
CONT SPEC 4OZ CLIKSEAL STRL BL (MISCELLANEOUS) ×2 IMPLANT
DECANTER SPIKE VIAL GLASS SM (MISCELLANEOUS) ×2 IMPLANT
DERMABOND ADVANCED (GAUZE/BANDAGES/DRESSINGS) ×1
DERMABOND ADVANCED .7 DNX12 (GAUZE/BANDAGES/DRESSINGS) ×1 IMPLANT
DRAPE LAPAROTOMY 100X72X124 (DRAPES) ×2 IMPLANT
DRAPE MICROSCOPE ZEISS OPMI (DRAPES) ×2 IMPLANT
DRAPE POUCH INSTRU U-SHP 10X18 (DRAPES) ×2 IMPLANT
DRAPE PROXIMA HALF (DRAPES) IMPLANT
DRAPE SURG 17X23 STRL (DRAPES) ×2 IMPLANT
DRSG OPSITE 4X5.5 SM (GAUZE/BANDAGES/DRESSINGS) ×1 IMPLANT
DRSG OPSITE POSTOP 3X4 (GAUZE/BANDAGES/DRESSINGS) ×1 IMPLANT
DURAPREP 26ML APPLICATOR (WOUND CARE) ×2 IMPLANT
DURASEAL APPLICATOR TIP (TIP) ×1 IMPLANT
DURASEAL SPINE SEALANT 3ML (MISCELLANEOUS) ×1 IMPLANT
ELECT REM PT RETURN 9FT ADLT (ELECTROSURGICAL) ×2
ELECTRODE REM PT RTRN 9FT ADLT (ELECTROSURGICAL) ×1 IMPLANT
GAUZE SPONGE 4X4 16PLY XRAY LF (GAUZE/BANDAGES/DRESSINGS) IMPLANT
GLOVE BIO SURGEON STRL SZ8 (GLOVE) ×2 IMPLANT
GLOVE BIOGEL PI IND STRL 6.5 (GLOVE) IMPLANT
GLOVE BIOGEL PI INDICATOR 6.5 (GLOVE) ×2
GLOVE ECLIPSE 8.5 STRL (GLOVE) ×1 IMPLANT
GLOVE EXAM NITRILE LRG STRL (GLOVE) IMPLANT
GLOVE EXAM NITRILE MD LF STRL (GLOVE) ×1 IMPLANT
GLOVE EXAM NITRILE XL STR (GLOVE) IMPLANT
GLOVE EXAM NITRILE XS STR PU (GLOVE) IMPLANT
GLOVE INDICATOR 7.0 STRL GRN (GLOVE) ×1 IMPLANT
GLOVE INDICATOR 8.5 STRL (GLOVE) ×2 IMPLANT
GOWN BRE IMP SLV AUR LG STRL (GOWN DISPOSABLE) ×2 IMPLANT
GOWN BRE IMP SLV AUR XL STRL (GOWN DISPOSABLE) ×5 IMPLANT
GOWN STRL REIN 2XL LVL4 (GOWN DISPOSABLE) IMPLANT
KIT BASIN OR (CUSTOM PROCEDURE TRAY) ×2 IMPLANT
KIT ROOM TURNOVER OR (KITS) ×2 IMPLANT
NDL SPNL 22GX3.5 QUINCKE BK (NEEDLE) ×1 IMPLANT
NEEDLE HYPO 22GX1.5 SAFETY (NEEDLE) ×2 IMPLANT
NEEDLE SPNL 22GX3.5 QUINCKE BK (NEEDLE) ×2 IMPLANT
NS IRRIG 1000ML POUR BTL (IV SOLUTION) ×2 IMPLANT
PACK LAMINECTOMY NEURO (CUSTOM PROCEDURE TRAY) ×2 IMPLANT
RUBBERBAND STERILE (MISCELLANEOUS) ×4 IMPLANT
SPONGE GAUZE 4X4 12PLY (GAUZE/BANDAGES/DRESSINGS) ×2 IMPLANT
SPONGE SURGIFOAM ABS GEL SZ50 (HEMOSTASIS) ×2 IMPLANT
STRIP CLOSURE SKIN 1/2X4 (GAUZE/BANDAGES/DRESSINGS) ×2 IMPLANT
STRIP CLOSURE SKIN 1/4X4 (GAUZE/BANDAGES/DRESSINGS) ×1 IMPLANT
SUT VIC AB 0 CT1 18XCR BRD8 (SUTURE) ×1 IMPLANT
SUT VIC AB 0 CT1 8-18 (SUTURE) ×2
SUT VIC AB 2-0 CT1 18 (SUTURE) ×2 IMPLANT
SUT VICRYL 4-0 PS2 18IN ABS (SUTURE) ×2 IMPLANT
SYR 20ML ECCENTRIC (SYRINGE) ×2 IMPLANT
TOWEL OR 17X24 6PK STRL BLUE (TOWEL DISPOSABLE) ×2 IMPLANT
TOWEL OR 17X26 10 PK STRL BLUE (TOWEL DISPOSABLE) ×2 IMPLANT
WATER STERILE IRR 1000ML POUR (IV SOLUTION) ×2 IMPLANT

## 2013-07-11 NOTE — Op Note (Signed)
Preoperative diagnosis: Lumbar spinal stenosis with severe foraminal stenosis of the S1 nerve roots to marked facet arthropathy L5-S1  Postoperative diagnosis: Same  Procedure: Decompressive lumbar laminectomy bilateral L5-S1 with partial medial facetectomies and foraminotomy of both S1 nerve roots  Surgeon: Jillyn Hidden Keya Wynes  Assistant: Sherilyn Cooter pool  Anesthesia: Gen.  EBL: Minimal  Complications: Inadvertent durotomy  History of present illness: Patient is a very pleasant 7903 minutes a progress worsening back bilateral leg pain rating down and L5-S1 nerve root and a worse on the left patient refractory to all forms of conservative treatment imaging showed severe spinal stenosis with marked facet arthropathy and severe foraminal stenosis of the S1 nerve roots bilaterally. And due to her failure conservative treatment imaging findings progression of clinical syndrome I recommended decompression at the L5-S1 disc space with foraminotomies at foraminotomies of the S1 nerve root. I extensively reviewed the risks and benefits of the operation the patient as well as perioperative course expectations about alternatives of surgery she understood and agreed to proceed forward.  Operative procedure: Patient brought into the or was induced under general anesthesia positioned prone the Wilson frame her back was prepped and draped in routine sterile fashion. Preoperative x-ray localize the appropriate level so after infiltration 10 cc lidocaine with epi a midline incision was made and Bovie light cautery was used to gas exchange tissues and subperiosteal dissections care lamina of L5 and S1 bilaterally. Interoperative x-ray confirmed the position appropriate level so than the spinous process was a markedly hypertrophic and overgrown the lamina so this had to be removed central decompression was begun the dura was identified the ligament was a markedly hypertrophied and this was removed in piecemeal fashion the facet  joints were also noted markedly hypertrophied causing severe hourglass compression of thecal sac first working on the right side using a 4 Penfield they'll dissection of the dura was dissected off the facet joint this was under bitten the L5 foramen was identified as well as the S1 nerve root and foramen these were all under been and unroofed decompressed and both foramina on the right side. After adequate decompression the right is taken the left during the dissection the left the facet joint and a large spur coming off the medial aspect of the facet joint was partially eroded through the dura with thinned it out and as I was dissecting the dura off of it saw CSF worked around it the spur was felt to be the predominant symptomatic spur slightly continue to work route decompress and decompress the proximal S1 foramen on that side identified the durotomy unroofed the facet joint overlying it partially to open up the foramen after I confirmed adequate decompression the S1 nerve root and the interest of the L5 foramen I packed this with the Gelfoam copiously irrigated removed the Gelfoam and a CSF leak appeared to tip guide itself against the undersurface of the residual facet joint this was in the area does be unable to suture so after it appeared that he was content of this off of a pack a small piece of Gelfoam overlying it and escorted DuraSeal some additional Gelfoam and more DuraSeal then closed the wound tightly with interrupted Vicryl 7 all layers and a running 4 subcuticular Dermabond benzo insertions and the skin at the end of case all needle counts and sponge counts were correct the patient recovered in stable condition.

## 2013-07-11 NOTE — H&P (Signed)
[name] is an 79 y.o. female.   Chief Complaint: Back and probably left leg pain HPI: Patient is a very pleasant 79 year old female is a long-standing back and probably left leg pain rating down the back and outside of her left calf into her foot probably the outside bother him occasionally the top of her foot. She occasionally will have pain although down the right leg. She denies any bowel bladder complaints denies any and numbness and tingling in her feet. This verified that all forms of conservative treatment. Imaging shows severe spinal stenosis at L5-S1 with by foraminal stenosis of the S1 nerve roots actually worse on the right however still severe on the left. We attempted to perform this operation many months ago however she said episodes of infection skin lesions on her legs as of now been treated and she has been cleared for surgery. So due to her failure conservative treatment progression of clinical syndrome and imaging findings I recommended decompressive laminectomy at L5-S1 with foraminotomies of the S1 nerve root possible discectomy at the extensively reviewed the risks and benefits of the operation with her as well as perioperative course and expectations of outcome alternatives of surgery she understands and agrees to proceed forward.  Past Medical History  Diagnosis Date  . DEPRESSION 05/21/2007  . FIBROCYSTIC BREAST DISEASE, HX OF 05/21/2007  . HYPERLIPIDEMIA-MIXED 11/26/2008    takes Simvastatin daily  . Irritable bowel syndrome 05/21/2007  . KNEE PAIN 09/16/2007  . LEG EDEMA, BILATERAL 05/21/2009  . OSTEOPENIA 05/21/2007  . URTICARIA, CHRONIC 05/21/2007  . History of aortic aneurysm     resection and grafting of a 5.2-cm ascending aortic arch aneurysm August 2009  . Dyslipidemia   . Vitamin D deficiency   . Urticaria   . Neuropathy     left radial  neuropathy with wrist drop  . Diverticulosis   . Irritable bowel syndrome   . GERD (gastroesophageal reflux disease)   .  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 Vaginal yeast infection     recently told to use monistat, hasn't started as of 01/26/2012  . HYPERTENSION 05/11/2008    takes Metoprolol and HCTZ daily  . Asthma     hx of;as a child  . Seasonal allergies     takes Hydroxyzine prn and Zyrtec 2 times a week  . Peripheral edema     takes Furosemide prn  . Pneumonia     history of;in the 90's  . Weakness     hands and feet  . Sciatica   . OSTEOARTHRITIS 01/20/2008    takes Methotrexate weekly  . DJD (degenerative joint disease)     history of   . Rheumatoid arthritis(714.0)   . Chronic back pain     takes Norco prn  . Bruises easily     takes Plaquenil daily  . CORONARY ATHEROSCLEROSIS, ARTERY BYPASS GRAFT 08/23/2008  . CAD (coronary artery disease)     s/p CABG -  x 1 with LIMA to LAD August 2009 /   . Urinary incontinence   . Bowel incontinence   . History of colon polyps   . Urinary frequency   . Urinary urgency   . Nocturia   . Anemia     takes folic acid bid  . Urinary incontinence     wears depends    Past Surgical History  Procedure Laterality Date  . Appendectomy    . Cesarean section  1968  x 1  . Hernia repair    . Coronary artery bypass graft  August 2009    CABG x 1 with LIMA to LAD /  . Ascending aortic aneurysm repair  August 2009     resection and grafting of a 5.2-cm ascending aortic arch aneurysm  . Joint replacement      2009 & 2010- both knees  . Back surgery      2011- cerv. fusion   . Breast surgery      biopsy x2- R breast  . Tonsillectomy      as a child  . Cardiac catheterization  2009  . Colonoscopy      Family History  Problem Relation Age of Onset  . Heart disease    . Coronary artery disease    . Anesthesia problems Neg Hx   . Hypotension Neg Hx   . Malignant hyperthermia Neg Hx   . Pseudochol deficiency Neg Hx   . Heart disease Mother   . Stroke Father   . Cancer Maternal Grandmother   . Stroke Paternal Grandmother     Social History:  reports that she has never smoked. She does not have any smokeless tobacco history on file. She reports that she does not drink alcohol or use illicit drugs.  Allergies:  Allergies  Allergen Reactions  . Amoxicillin     REACTION: unspecified  . Cyclobenzaprine Hcl     REACTION: rash  . Mupirocin     Unknown reaction  . Penicillins Hives  . Tetanus Toxoid Hives    Medications Prior to Admission  Medication Sig Dispense Refill  . aspirin 81 MG tablet Take 81 mg by mouth daily.       . calcium carbonate (TUMS - DOSED IN MG ELEMENTAL CALCIUM) 500 MG chewable tablet Chew 2 tablets by mouth 2 (two) times daily.       . cetirizine (ZYRTEC) 10 MG tablet Take 10 mg by mouth daily as needed. Alternating with Hydroxyzine  For allergies      . folic acid (FOLVITE) 1 MG tablet Take 1 mg by mouth 2 (two) times daily.        . furosemide (LASIX) 40 MG tablet Take 40 mg by mouth 2 (two) times a week. As needed for sweling      . hydrochlorothiazide (HYDRODIURIL) 25 MG tablet Take 25 mg by mouth daily.      Marland Kitchen HYDROcodone-acetaminophen (NORCO/VICODIN) 5-325 MG per tablet Take 1 tablet by mouth every 6 (six) hours as needed for pain.      . hydroxychloroquine (PLAQUENIL) 200 MG tablet Take 200 mg by mouth 2 (two) times daily.       . hydrOXYzine (ATARAX/VISTARIL) 25 MG tablet Take 25 mg by mouth every 4 (four) hours as needed. For allergies alternate with Zyrtec      . methotrexate (RHEUMATREX) 2.5 MG tablet Take 15 mg by mouth once a week. Caution:Chemotherapy. Protect from light.      . metoprolol succinate (TOPROL-XL) 50 MG 24 hr tablet Take 50 mg by mouth daily.       . potassium chloride SA (KLOR-CON M20) 20 MEQ tablet Take 20 mEq by mouth 2 (two) times a week. As needed with Furosemide      . simvastatin (ZOCOR) 40 MG tablet Take 40 mg by mouth at bedtime.       Marland Kitchen tobramycin-dexamethasone (TOBRADEX) ophthalmic solution Place 1 drop into both eyes every 4 (four) hours while  awake.  No results found for this or any previous visit (from the past 48 hour(s)). No results found.  Review of Systems  Constitutional: Negative.   HENT: Negative.   Eyes: Negative.   Respiratory: Negative.   Cardiovascular: Negative.   Gastrointestinal: Negative.   Genitourinary: Negative.   Musculoskeletal: Positive for myalgias and back pain.  Skin: Positive for rash.  Neurological: Positive for tingling.  Endo/Heme/Allergies: Negative.   Psychiatric/Behavioral: Negative.     Blood pressure 136/73, pulse 75, temperature 97.6 F (36.4 C), temperature source Oral, resp. rate 18, SpO2 99.00%. Physical Exam  Neurological: She has normal strength. GCS eye subscore is 4. GCS verbal subscore is 5. GCS motor subscore is 6.  Reflex Scores:      Patellar reflexes are 0 on the right side and 0 on the left side.      Achilles reflexes are 0 on the right side and 0 on the left side. Strength is 5 of 5 in her iliopsoas, quads, hamstrings, gastrocs, anterior tibialis, and EHL.     Assessment/Plan 79 year female presents for a decompressive lumbar laminectomy L5-S1.  Emileigh Kellett P 07/11/2013, 7:26 AM

## 2013-07-11 NOTE — Transfer of Care (Signed)
Immediate Anesthesia Transfer of Care Note  Patient: [name]  Procedure(s) Performed: Procedure(s): LUMBAR LAMINECTOMY/DECOMPRESSION MICRODISCECTOMY LUMBAR FIVE SACRAL ONE (Bilateral)  Patient Location: PACU  Anesthesia Type:General  Level of Consciousness: awake, alert  and oriented  Airway & Oxygen Therapy: Patient Spontanous Breathing and Patient connected to nasal cannula oxygen  Post-op Assessment: Report given to PACU RN, Post -op Vital signs reviewed and stable and Patient moving all extremities  Post vital signs: Reviewed and stable  Complications: No apparent anesthesia complications

## 2013-07-11 NOTE — Anesthesia Postprocedure Evaluation (Signed)
  Anesthesia Post-op Note  Patient: [name]  Procedure(s) Performed: Procedure(s): LUMBAR LAMINECTOMY/DECOMPRESSION MICRODISCECTOMY LUMBAR FIVE SACRAL ONE (Bilateral)  Patient Location: PACU  Anesthesia Type:General  Level of Consciousness: awake  Airway and Oxygen Therapy: Patient Spontanous Breathing  Post-op Pain: mild  Post-op Assessment: Post-op Vital signs reviewed  Post-op Vital Signs: Reviewed  Complications: No apparent anesthesia complications

## 2013-07-11 NOTE — Anesthesia Preprocedure Evaluation (Addendum)
Anesthesia Evaluation  Patient identified by MRN, date of birth, ID band Patient awake    Reviewed: Allergy & Precautions, H&P , NPO status , Patient's Chart, lab work & pertinent test results  Airway Mallampati: II      Dental   Pulmonary asthma , pneumonia -,  breath sounds clear to auscultation        Cardiovascular hypertension, + CAD + Valvular Problems/Murmurs Rhythm:Regular Rate:Normal     Neuro/Psych    GI/Hepatic Neg liver ROS, GERD-  ,  Endo/Other  negative endocrine ROS  Renal/GU negative Renal ROS     Musculoskeletal   Abdominal   Peds  Hematology negative hematology ROS (+)   Anesthesia Other Findings   Reproductive/Obstetrics                          Anesthesia Physical Anesthesia Plan  ASA: III  Anesthesia Plan: General   Post-op Pain Management:    Induction: Intravenous  Airway Management Planned: Oral ETT  Additional Equipment:   Intra-op Plan:   Post-operative Plan: Extubation in OR  Informed Consent: I have reviewed the patients History and Physical, chart, labs and discussed the procedure including the risks, benefits and alternatives for the proposed anesthesia with the patient or authorized representative who has indicated his/her understanding and acceptance.   Dental advisory given  Plan Discussed with: CRNA, Anesthesiologist and Surgeon  Anesthesia Plan Comments:         Anesthesia Quick Evaluation

## 2013-07-11 NOTE — Preoperative (Signed)
Beta Blockers   Reason not to administer Beta Blockers:Not Applicable 

## 2013-07-12 NOTE — Plan of Care (Signed)
Problem: Consults Goal: Diagnosis - Spinal Surgery Outcome: Completed/Met Date Met:  07/12/13 Lumbar Laminectomy (Complex)     

## 2013-07-12 NOTE — Progress Notes (Signed)
Subjective: Patient reports Overall she's doing much better she says her legs feel a lot better less numbness less pain she denies any headache  Objective: Vital signs in last 24 hours: Temp:  [96.9 F (36.1 C)-99.7 F (37.6 C)] 98.8 F (37.1 C) (08/19 0338) Pulse Rate:  [61-100] 100 (08/19 0338) Resp:  [14-19] 18 (08/19 0338) BP: (98-153)/(38-70) 146/55 mmHg (08/19 0338) SpO2:  [93 %-100 %] 93 % (08/19 0338) Weight:  [79.652 kg (175 lb 9.6 oz)] 79.652 kg (175 lb 9.6 oz) (08/18 1100)  Intake/Output from previous day: 08/18 0701 - 08/19 0700 In: 1780 [P.O.:480; I.V.:1300] Out: 2750 [Urine:2650; Blood:100] Intake/Output this shift: Total I/O In: -  Out: 1450 [Urine:1450]  Strength is 5 out of 5 wound is clean and dry  Lab Results: No results found for this basename: WBC, HGB, HCT, PLT,  in the last 72 hours BMET No results found for this basename: NA, K, CL, CO2, GLUCOSE, BUN, CREATININE, CALCIUM,  in the last 72 hours  Studies/Results: Dg Lumbar Spine 1 View  07/11/2013   *RADIOLOGY REPORT*  Clinical Data: Intraoperative radiograph for probe localization  LUMBAR SPINE - 1 VIEW  Comparison: 01/25/2013  Findings: Portable lateral radiograph demonstrates tissue spreaders posterior to the L5 vertebra.  The surgical probe is posterior to and directed towards the L5-S1 disc space.  Multilevel spondylosis is again identified.  Anterolisthesis of L5 on S1 is similar to prior exam.  IMPRESSION:  1.  Surgical probe is posterior to the L5-S1 disc space.   Original Report Authenticated By: Signa Kell, M.D.    Assessment/Plan: Postop day 1 from a decompressive lumbar laminectomy with intraoperative inadvertent dural tear. She has maintained flaps in surgery I will continue to keep her flat with logroll privileges throughout the day today we'll start her up tomorrow possible discharge later tomorrow if she remains asymptomatic and dry once we immobilize her.  LOS: 1 day     Kristin Lamagna  P 07/12/2013, 6:49 AM

## 2013-07-13 ENCOUNTER — Encounter (HOSPITAL_COMMUNITY): Payer: Self-pay | Admitting: Neurosurgery

## 2013-07-13 MED ORDER — HYDROCODONE-ACETAMINOPHEN 5-325 MG PO TABS
1.0000 | ORAL_TABLET | Freq: Four times a day (QID) | ORAL | Status: DC | PRN
  Administered 2013-07-13 (×2): 2 via ORAL
  Administered 2013-07-13: 1 via ORAL
  Administered 2013-07-14 – 2013-07-17 (×12): 2 via ORAL
  Administered 2013-07-17 – 2013-07-18 (×2): 1 via ORAL
  Administered 2013-07-18 – 2013-07-20 (×7): 2 via ORAL
  Filled 2013-07-13 (×8): qty 2
  Filled 2013-07-13: qty 1
  Filled 2013-07-13: qty 2
  Filled 2013-07-13: qty 1
  Filled 2013-07-13 (×3): qty 2
  Filled 2013-07-13: qty 1
  Filled 2013-07-13 (×11): qty 2

## 2013-07-13 NOTE — Progress Notes (Signed)
Rehab Admissions Coordinator Note:  Patient was screened by Clois Dupes for appropriateness for an Inpatient Acute Rehab Consult.  At this time, we are recommending Inpatient Rehab consult as well as OT eval.  Clois Dupes 07/13/2013, 1:21 PM  I can be reached at [phone].

## 2013-07-13 NOTE — Progress Notes (Signed)
Subjective: Patient reports she feels better legs feel better no headache no nausea or vomiting except for 1 episode that was because she chewed a tablet she should swallowed.  Objective: Vital signs in last 24 hours: Temp:  [98.5 F (36.9 C)-99.9 F (37.7 C)] 99.9 F (37.7 C) (08/20 0340) Pulse Rate:  [64-94] 74 (08/20 0340) Resp:  [16-18] 16 (08/20 0340) BP: (104-118)/(49-65) 115/58 mmHg (08/20 0340) SpO2:  [92 %-93 %] 93 % (08/20 0340)  Intake/Output from previous day: 08/19 0701 - 08/20 0700 In: 360 [P.O.:360] Out: 1675 [Urine:1675] Intake/Output this shift:    Awake alert oriented strength out of 5 wound clean and dry and flat  Lab Results: No results found for this basename: WBC, HGB, HCT, PLT,  in the last 72 hours BMET No results found for this basename: NA, K, CL, CO2, GLUCOSE, BUN, CREATININE, CALCIUM,  in the last 72 hours  Studies/Results: Dg Lumbar Spine 1 View  07/11/2013   *RADIOLOGY REPORT*  Clinical Data: Intraoperative radiograph for probe localization  LUMBAR SPINE - 1 VIEW  Comparison: 01/25/2013  Findings: Portable lateral radiograph demonstrates tissue spreaders posterior to the L5 vertebra.  The surgical probe is posterior to and directed towards the L5-S1 disc space.  Multilevel spondylosis is again identified.  Anterolisthesis of L5 on S1 is similar to prior exam.  IMPRESSION:  1.  Surgical probe is posterior to the L5-S1 disc space.   Original Report Authenticated By: Signa Kell, M.D.    Assessment/Plan: Slowly razor head and a progress mobilization  LOS: 2 days    [name] P 07/13/2013, 8:09 AM

## 2013-07-13 NOTE — Progress Notes (Signed)
Physical Therapy Evaluation Patient Details Name: [name] MRN: [id number] DOB: Sep 22, 1932 Today's Date: 07/13/2013 Time: 1135-1200 PT Time Calculation (min): 25 min  PT Assessment / Plan / Recommendation History of Present Illness  Pt admit for L5-S1 laminectomy and microdiscectomy with intraoperative dural tear.    Clinical Impression  Pt admitted with surgery above. Pt currently with functional limitations due to the deficits listed below (see PT Problem List). Will benefit from Rehab as pt took two people to ambulate today to be safe.  Pt very unsteady and will need aggressive rehab prior to d/c home. Pt will benefit from skilled PT to increase their independence and safety with mobility to allow discharge to the venue listed below.     PT Assessment  Patient needs continued PT services    Follow Up Recommendations  CIR;Supervision/Assistance - 24 hour    Does the patient have the potential to tolerate intense rehabilitation    yes          Equipment Recommendations  None recommended by PT    Recommendations for Other Services Rehab consult   Frequency Min 5X/week    Precautions / Restrictions Precautions Precautions: Fall;Back Precaution Booklet Issued: Yes (comment) Restrictions Weight Bearing Restrictions: No   Pertinent Vitals/Pain VSS, some back and left hip pain      Mobility  Bed Mobility Bed Mobility: Rolling Right;Right Sidelying to Sit;Sitting - Scoot to Edge of Bed Rolling Right: 3: Mod assist;With rail Right Sidelying to Sit: 3: Mod assist;With rails;HOB elevated Sitting - Scoot to Edge of Bed: 3: Mod assist Details for Bed Mobility Assistance: Pt needed cues for sequencing movement.  Needed assist  and cues to log roll and even once at EOB kept leaning to her right elbow to take weight off her left hip.  Pt stated it was "grabbing".  Used pad to asssit to square pt up on EOB as well.   Transfers Transfers: Sit to Stand;Stand to Sit Sit to  Stand: 2: Max assist;From elevated surface;From bed Stand to Sit: 2: Max assist;With upper extremity assist;To chair/3-in-1;With armrests Details for Transfer Assistance: Pt needed cues for hand placement.  Took incr time and assist to get the pt OOB and multiple attempts.  Once up, took max assist to keep pt from leaning posteriorly.  Pt leaned posteriorly entire rest of session while up on feet.   Ambulation/Gait Ambulation/Gait Assistance: 3: Mod assist;2: Max assist with nursing standing by for guidance and balance as pt's LEs very weak.  Pt almost in squatting position at times due to weakness.   Ambulation Distance (Feet): 15 Feet Assistive device: Rolling walker Ambulation/Gait Assistance Details: Pt ambulated with cues and assist to sequence steps and RW.  Pt leaning posteriorly entire evaluation.  Pt needed cues to sequence steps and RW with each step.  Poor postural stability and control.   Gait Pattern: Step-to pattern;Decreased step length - left;Decreased step length - right;Decreased stride length;Decreased hip/knee flexion - right;Decreased hip/knee flexion - left;Decreased weight shift to left;Shuffle;Trunk flexed;Narrow base of support Gait velocity: decreased Stairs: No Wheelchair Mobility Wheelchair Mobility: No         PT Diagnosis: Difficulty walking  PT Problem List: Decreased strength;Decreased activity tolerance;Decreased mobility;Decreased knowledge of use of DME;Decreased safety awareness;Decreased knowledge of precautions;Pain PT Treatment Interventions: DME instruction;Gait training;Functional mobility training;Therapeutic activities;Therapeutic exercise;Balance training;Patient/family education;Stair training     PT Goals(Current goals can be found in the care plan section) Acute Rehab PT Goals Patient Stated Goal: to go home  PT Goal Formulation: With patient Time For Goal Achievement: 07/27/13 Potential to Achieve Goals: Good  Visit Information  Last PT  Received On: 07/13/13 Assistance Needed: +2 History of Present Illness: Pt admit for L5-S1 laminectomy and microdiscectomy with intraoperative dural tear.         Prior Functioning  Home Living Family/patient expects to be discharged to:: Private residence Living Arrangements: Alone Available Help at Discharge: Family;Available PRN/intermittently Type of Home: House Home Access: Stairs to enter [employer] of Steps: 1 Entrance Stairs-Rails: None Home Layout: One level Home Equipment: Walker - 4 wheels;Shower seat;Bedside commode;Hand held shower head;Grab bars - tub/shower Additional Comments: Pt very deconditioned following surgery.  States that her daughter can stay with her a few days on d/c.  Pt reports that she falls at home all the time.   Prior Function Level of Independence: Independent with assistive device(s) Communication Communication: No difficulties    Cognition  Cognition Arousal/Alertness: Awake/alert Behavior During Therapy: WFL for tasks assessed/performed Overall Cognitive Status: Within Functional Limits for tasks assessed    Extremity/Trunk Assessment Upper Extremity Assessment Upper Extremity Assessment: Defer to OT evaluation Lower Extremity Assessment Lower Extremity Assessment: RLE deficits/detail;LLE deficits/detail RLE Deficits / Details: grossly 3-/5 LLE Deficits / Details: grossly 3-/5 Cervical / Trunk Assessment Cervical / Trunk Assessment: Kyphotic   Balance Balance Balance Assessed: Yes Static Sitting Balance Static Sitting - Balance Support: Bilateral upper extremity supported;Feet supported Static Sitting - Level of Assistance: 3: Mod assist Static Sitting - Comment/# of Minutes: 2 Static Standing Balance Static Standing - Balance Support: Bilateral upper extremity supported;During functional activity Static Standing - Level of Assistance: 3: Mod assist;2: Max assist Static Standing - Comment/# of Minutes: 2 minutes with RW  with significant posterior lean.  End of Session PT - End of Session Equipment Utilized During Treatment: Gait belt;Oxygen Activity Tolerance: Patient limited by fatigue;Patient limited by pain Patient left: in chair;with call bell/phone within reach;with nursing/sitter in room Nurse Communication: Mobility status;Need for lift equipment  GP Functional Assessment Tool Used: clinical judgment Functional Limitation: Mobility: Walking and moving around Mobility: Walking and Moving Around Current Status [phone]): At least 60 percent but less than 80 percent impaired, limited or restricted Mobility: Walking and Moving Around Goal Status [phone]): At least 20 percent but less than 40 percent impaired, limited or restricted   INGOLD,Karinda Cabriales 07/13/2013, 12:12 PM  [facility] Acute Rehabilitation [phone] (pager)

## 2013-07-14 DIAGNOSIS — IMO0002 Reserved for concepts with insufficient information to code with codable children: Secondary | ICD-10-CM

## 2013-07-14 DIAGNOSIS — M48062 Spinal stenosis, lumbar region with neurogenic claudication: Secondary | ICD-10-CM

## 2013-07-14 NOTE — Progress Notes (Signed)
   CARE MANAGEMENT NOTE 07/14/2013  Patient:  [name], [name]   Account Number:  [id number]  Date Initiated:  07/14/2013  Documentation initiated by:  Jiles Crocker  Subjective/Objective Assessment:   ADMITTED FOR SURGERY - Inadvertent durotomy     Action/Plan:   CM FOLLOWING FOR DCP   Anticipated DC Date:     Anticipated DC Plan:  SKILLED NURSING FACILITY  In-house referral  Clinical Social Worker      DC Associate Professor  CM consult          Status of service:  In process, will continue to follow Medicare Important Message given?  NA - LOS <3 / Initial given by admissions (If response is "NO", the following Medicare IM given date fields will be blank)  Per UR Regulation:  Reviewed for med. necessity/level of care/duration of stay  Comments:  07/14/2013- B Tyisha Cressy RN,BSN,MHA

## 2013-07-14 NOTE — Progress Notes (Signed)
Subjective: Patient reports She's doing okay she still has improved radicular symptoms in her feet that she's having a lot of hip pain and left greater right  Objective: Vital signs in last 24 hours: Temp:  [97.7 F (36.5 C)-99.8 F (37.7 C)] 99.8 F (37.7 C) (08/21 0411) Pulse Rate:  [59-92] 73 (08/21 0411) Resp:  [16-18] 16 (08/21 0411) BP: (99-138)/(50-65) 100/63 mmHg (08/21 0411) SpO2:  [92 %-96 %] 96 % (08/21 0411)  Intake/Output from previous day: 08/20 0701 - 08/21 0700 In: 240 [P.O.:240] Out: 2100 [Urine:2100] Intake/Output this shift:    Awake alert strength out of 5 slow mobilization secondary pain control  Lab Results: No results found for this basename: WBC, HGB, HCT, PLT,  in the last 72 hours BMET No results found for this basename: NA, K, CL, CO2, GLUCOSE, BUN, CREATININE, CALCIUM,  in the last 72 hours  Studies/Results: No results found.  Assessment/Plan: Patient can use a have a lot of pain in her back and her hips is not rate and a leg or radicular symptoms are better she is very slow to mobilize physical therapy had a lot of difficulty ambulating her I think she would benefit from inpatient rehabilitation so I have a consult at the rehabilitation doctors.  LOS: 3 days     Bibi Economos P 07/14/2013, 7:29 AM

## 2013-07-14 NOTE — Progress Notes (Signed)
PT Cancellation Note  Patient Details Name: AYAKA ANDES MRN: [id number] DOB: 1931/12/01   Cancelled Treatment:    Reason Eval/Treat Not Completed: Fatigue/lethargy limiting ability to participate; just back to bed after up with nursing.  Agrees to try later this pm.  Will see around 2pm per pt preference.   Ashiya Kinkead,CYNDI 07/14/2013, 10:59 AM

## 2013-07-14 NOTE — Progress Notes (Signed)
Report called to Selena Batten, Charity fundraiser. Pt transferred to 4N @ 1435 per MD order. Rema Fendt, RN

## 2013-07-14 NOTE — Progress Notes (Signed)
Physical Therapy Treatment Patient Details Name: [name] MRN: [id number] DOB: 08-Sep-1932 Today's Date: 07/14/2013 Time: 4098-1191 PT Time Calculation (min): 23 min  PT Assessment / Plan / Recommendation  History of Present Illness Pt admit for L5-S1 laminectomy and microdiscectomy with intraoperative dural tear.     PT Comments   Patient improving with distance for ambulation and sitting balance.  Still with significant LE weakness, posterior loss of balance at times and limited tolerance to activity.  Feel she may progress to tolerate therapies on inpatient rehab and allow return home with family assist.  Follow Up Recommendations  CIR;Supervision/Assistance - 24 hour     Does the patient have the potential to tolerate intense rehabilitation   Potentially  Barriers to Discharge  None      Equipment Recommendations  None recommended by PT    Recommendations for Other Services Rehab consult  Frequency Min 5X/week   Progress towards PT Goals Progress towards PT goals: Progressing toward goals  Plan Current plan remains appropriate    Precautions / Restrictions Precautions Precautions: Fall;Back   Pertinent Vitals/Pain C/o pain in back and left leg    Mobility  Bed Mobility Details for Bed Mobility Assistance: pt up in chair preparing to transport to 4N. Transfers Sit to Stand: From chair/3-in-1;With armrests;3: Mod assist;With upper extremity assist Stand to Sit: To chair/3-in-1;3: Mod assist;With armrests Details for Transfer Assistance: cues to scoot to edge of wheelchair, assist for lifting from chair due to legs weak and buckling under her Ambulation/Gait Ambulation/Gait Assistance: 4: Min assist;3: Mod assist Ambulation Distance (Feet): 30 Feet Assistive device: Rolling walker Ambulation/Gait Assistance Details: assist for anterior weight shift, to progress walker, for picking up left leg. Gait Pattern: Step-to pattern;Lateral hip instability;Shuffle;Decreased  stride length;Decreased hip/knee flexion - left      PT Goals (current goals can now be found in the care plan section)    Visit Information  Last PT Received On: 07/14/13 Assistance Needed: +2 History of Present Illness: Pt admit for L5-S1 laminectomy and microdiscectomy with intraoperative dural tear.      Subjective Data      Cognition  Cognition Arousal/Alertness: Awake/alert Behavior During Therapy: WFL for tasks assessed/performed Overall Cognitive Status: Within Functional Limits for tasks assessed    Balance  Static Sitting Balance Static Sitting - Balance Support: Feet supported;Bilateral upper extremity supported Static Sitting - Level of Assistance: 5: Stand by assistance Static Sitting - Comment/# of Minutes: able to sit edge of chair with hands on armrests without falling back, reports sat edge of bed to eat breakfast today not falling back Static Standing Balance Static Standing - Balance Support: Bilateral upper extremity supported Static Standing - Level of Assistance: 3: Mod assist  End of Session PT - End of Session Equipment Utilized During Treatment: Gait belt Activity Tolerance: Patient limited by fatigue Patient left: in chair;with call bell/phone within reach   GP     [facility] 07/14/2013, 7:15 PM [place], [place] [phone] 07/14/2013

## 2013-07-14 NOTE — Consult Note (Signed)
Physical Medicine and Rehabilitation Consult Reason for Consult: Lumbar stenosis with radiculopathy Referring Physician: Dr. Wynetta Emery   HPI: [name] is a 79 y.o. right-handed female with history coronary artery disease a coronary artery bypass grafting, rheumatoid arthritis and bilateral knee replacements and  long-standing back pain radiating to the lower extremities. Independent with a cane prior to admission. X-rays and imaging revealed lumbar spinal stenosis with severe foraminal stenosis of the S1 nerve roots with marked facet arthropathy L5-S1 with radiculopathy. No relief with conservative care. Underwent decompressive lumbar laminectomy bilateral L5-S1 with intraoperative dural tear and repair 07/11/2013 per Dr. Wynetta Emery. Postoperative pain management. Physical therapy evaluation completed 07/13/2013 with recommendations of physical medicine rehabilitation consult to consider inpatient rehabilitation services. Patient states that she had some urinary incontinence prior to surgery Complaints of constipation postop  Review of Systems  Cardiovascular: Positive for leg swelling.  Gastrointestinal:       GERD  Genitourinary:       Urinary urgency  Musculoskeletal: Positive for back pain, joint pain and falls.  Neurological: Positive for weakness.  Psychiatric/Behavioral: Positive for depression.  All other systems reviewed and are negative.   Past Medical History  Diagnosis Date  . DEPRESSION 05/21/2007  . FIBROCYSTIC BREAST DISEASE, HX OF 05/21/2007  . HYPERLIPIDEMIA-MIXED 11/26/2008    takes Simvastatin daily  . Irritable bowel syndrome 05/21/2007  . KNEE PAIN 09/16/2007  . LEG EDEMA, BILATERAL 05/21/2009  . OSTEOPENIA 05/21/2007  . URTICARIA, CHRONIC 05/21/2007  . History of aortic aneurysm     resection and grafting of a 5.2-cm ascending aortic arch aneurysm August 2009  . Dyslipidemia   . Vitamin D deficiency   . Urticaria   . Neuropathy     left radial  neuropathy with wrist  drop  . Diverticulosis   . Irritable bowel syndrome   . GERD (gastroesophageal reflux disease)   . 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 Vaginal yeast infection     recently told to use monistat, hasn't started as of 01/26/2012  . HYPERTENSION 05/11/2008    takes Metoprolol and HCTZ daily  . Asthma     hx of;as a child  . Seasonal allergies     takes Hydroxyzine prn and Zyrtec 2 times a week  . Peripheral edema     takes Furosemide prn  . Pneumonia     history of;in the 90's  . Weakness     hands and feet  . Sciatica   . OSTEOARTHRITIS 01/20/2008    takes Methotrexate weekly  . DJD (degenerative joint disease)     history of   . Rheumatoid arthritis(714.0)   . Chronic back pain     takes Norco prn  . Bruises easily     takes Plaquenil daily  . CORONARY ATHEROSCLEROSIS, ARTERY BYPASS GRAFT 08/23/2008  . CAD (coronary artery disease)     s/p CABG -  x 1 with LIMA to LAD August 2009 /   . Urinary incontinence   . Bowel incontinence   . History of colon polyps   . Urinary frequency   . Urinary urgency   . Nocturia   . Anemia     takes folic acid bid  . Urinary incontinence     wears depends   Past Surgical History  Procedure Laterality Date  . Appendectomy    . Cesarean section  1968    x 1  . Hernia repair    . Coronary artery  bypass graft  August 2009    CABG x 1 with LIMA to LAD /  . Ascending aortic aneurysm repair  August 2009     resection and grafting of a 5.2-cm ascending aortic arch aneurysm  . Joint replacement      2009 & 2010- both knees  . Back surgery      2011- cerv. fusion   . Breast surgery      biopsy x2- R breast  . Tonsillectomy      as a child  . Cardiac catheterization  2009  . Colonoscopy    . Lumbar laminectomy/decompression microdiscectomy Bilateral 07/11/2013    Procedure: LUMBAR LAMINECTOMY/DECOMPRESSION MICRODISCECTOMY LUMBAR FIVE SACRAL ONE;  Surgeon: Mariam Dollar, MD;  Location: [facility];  Service: Neurosurgery;   Laterality: Bilateral;   Family History  Problem Relation Age of Onset  . Heart disease    . Coronary artery disease    . Anesthesia problems Neg Hx   . Hypotension Neg Hx   . Malignant hyperthermia Neg Hx   . Pseudochol deficiency Neg Hx   . Heart disease Mother   . Stroke Father   . Cancer Maternal Grandmother   . Stroke Paternal Grandmother    Social History:  reports that she has never smoked. She does not have any smokeless tobacco history on file. She reports that she does not drink alcohol or use illicit drugs. Allergies:  Allergies  Allergen Reactions  . Amoxicillin     REACTION: unspecified  . Cyclobenzaprine Hcl     REACTION: rash  . Mupirocin     Unknown reaction  . Penicillins Hives  . Tetanus Toxoid Hives   Medications Prior to Admission  Medication Sig Dispense Refill  . aspirin 81 MG tablet Take 81 mg by mouth daily.       . calcium carbonate (TUMS - DOSED IN MG ELEMENTAL CALCIUM) 500 MG chewable tablet Chew 2 tablets by mouth 2 (two) times daily.       . cetirizine (ZYRTEC) 10 MG tablet Take 10 mg by mouth daily as needed. Alternating with Hydroxyzine  For allergies      . folic acid (FOLVITE) 1 MG tablet Take 1 mg by mouth 2 (two) times daily.        . furosemide (LASIX) 40 MG tablet Take 40 mg by mouth 2 (two) times a week. As needed for sweling      . hydrochlorothiazide (HYDRODIURIL) 25 MG tablet Take 25 mg by mouth daily.      Marland Kitchen HYDROcodone-acetaminophen (NORCO/VICODIN) 5-325 MG per tablet Take 1 tablet by mouth every 6 (six) hours as needed for pain.      . hydroxychloroquine (PLAQUENIL) 200 MG tablet Take 200 mg by mouth 2 (two) times daily.       . hydrOXYzine (ATARAX/VISTARIL) 25 MG tablet Take 25 mg by mouth every 4 (four) hours as needed. For allergies alternate with Zyrtec      . methotrexate (RHEUMATREX) 2.5 MG tablet Take 15 mg by mouth once a week. Caution:Chemotherapy. Protect from light.      . metoprolol succinate (TOPROL-XL) 50 MG 24 hr  tablet Take 50 mg by mouth daily.       . potassium chloride SA (KLOR-CON M20) 20 MEQ tablet Take 20 mEq by mouth 2 (two) times a week. As needed with Furosemide      . simvastatin (ZOCOR) 40 MG tablet Take 40 mg by mouth at bedtime.       Marland Kitchen  tobramycin-dexamethasone (TOBRADEX) ophthalmic solution Place 1 drop into both eyes every 4 (four) hours while awake.         Home: Home Living Family/patient expects to be discharged to:: Private residence Living Arrangements: Alone Available Help at Discharge: Family;Available PRN/intermittently Type of Home: House Home Access: Stairs to enter [employer] of Steps: 1 Entrance Stairs-Rails: None Home Layout: One level Home Equipment: Walker - 4 wheels;Shower seat;Bedside commode;Hand held shower head;Grab bars - tub/shower Additional Comments: Pt very deconditioned following surgery.  States that her daughter can stay with her a few days on d/c.  Pt reports that she falls at home all the time.    Functional History:   Functional Status:  Mobility: Bed Mobility Bed Mobility: Rolling Right;Right Sidelying to Sit;Sitting - Scoot to Edge of Bed Rolling Right: 3: Mod assist;With rail Right Sidelying to Sit: 3: Mod assist;With rails;HOB elevated Sitting - Scoot to Edge of Bed: 3: Mod assist Transfers Transfers: Sit to Stand;Stand to Sit Sit to Stand: 2: Max assist;From elevated surface;From bed Stand to Sit: 2: Max assist;With upper extremity assist;To chair/3-in-1;With armrests Ambulation/Gait Ambulation/Gait Assistance: 3: Mod assist;2: Max assist Ambulation Distance (Feet): 15 Feet Assistive device: Rolling walker Ambulation/Gait Assistance Details: Pt ambulated with cues and assist to sequence steps and RW.  Pt leaning posteriorly entire evaluation.  Pt needed cues to sequence steps and RW with each step.  Poor postural stability and control.   Gait Pattern: Step-to pattern;Decreased step length - left;Decreased step length -  right;Decreased stride length;Decreased hip/knee flexion - right;Decreased hip/knee flexion - left;Decreased weight shift to left;Shuffle;Trunk flexed;Narrow base of support Gait velocity: decreased Stairs: No Wheelchair Mobility Wheelchair Mobility: No  ADL:    Cognition: Cognition Overall Cognitive Status: Within Functional Limits for tasks assessed Orientation Level: Oriented X4 Cognition Arousal/Alertness: Awake/alert Behavior During Therapy: WFL for tasks assessed/performed Overall Cognitive Status: Within Functional Limits for tasks assessed  Blood pressure 118/60, pulse 78, temperature 98.8 F (37.1 C), temperature source Oral, resp. rate 16, height 5\' 3"  (1.6 m), weight 79.652 kg (175 lb 9.6 oz), SpO2 92.00%. Physical Exam  Vitals reviewed. Constitutional: She is oriented to person, place, and time.  HENT:  Head: Normocephalic.  Eyes: EOM are normal.  Neck: Normal range of motion. Neck supple. Thyromegaly present.  Cardiovascular: Normal rate and regular rhythm.   Pulmonary/Chest: Effort normal and breath sounds normal. No respiratory distress.  Abdominal: Soft. Bowel sounds are normal. She exhibits no distension.  Neurological: She is alert and oriented to person, place, and time.  Follows full commands  Skin:  Back incision is dressed  Psychiatric: She has a normal mood and affect.   motor strength normal upper extremities Lower extremity 4 minus hip flexor knee extensor ankle dorsiflexor plantar flexor bilaterally Negative straight leg raise Sensation intact to light touch in bilateral lower extremities. No evidence of clonus at the ankle  No results found for this or any previous visit (from the past 24 hour(s)). No results found.  Assessment/Plan: Diagnosis: Lumbar spinal stenosis with neurogenic claudication status post decompression and fusion L5-S1 postoperative day #3 1. Does the need for close, 24 hr/day medical supervision in concert with the patient's  rehab needs make it unreasonable for this patient to be served in a less intensive setting? Yes 2. Co-Morbidities requiring supervision/potential complications: History of coronary artery disease, hypertension, knee pain 3. Due to bladder management, bowel management, safety, skin/wound care, disease management, medication administration and pain management, does the patient require 24 hr/day rehab nursing?  Yes 4. Does the patient require coordinated care of a physician, rehab nurse, PT (Once 2 hrs/day, 5 days/week) and OT (1-2 hrs/day, 5 days/week) to address physical and functional deficits in the context of the above medical diagnosis(es)? Yes Addressing deficits in the following areas: balance, endurance, locomotion, strength, transferring, bowel/bladder control, bathing, dressing, feeding, grooming and toileting 5. Can the patient actively participate in an intensive therapy program of at least 3 hrs of therapy per day at least 5 days per week? Yes 6. The potential for patient to make measurable gains while on inpatient rehab is good 7. Anticipated functional outcomes upon discharge from inpatient rehab are supervision to modified independent mobility with PT, supervision to modified independent ADLs with OT, not applicable with SLP. 8. Estimated rehab length of stay to reach the above functional goals is: 7-10 days 9. Does the patient have adequate social supports to accommodate these discharge functional goals? Potentially 10. Anticipated D/C setting: Home 11. Anticipated post D/C treatments: HH therapy 12. Overall Rehab/Functional Prognosis: good  RECOMMENDATIONS: This patient's condition is appropriate for continued rehabilitative care in the following setting: CIR Patient has agreed to participate in recommended program. Yes Note that insurance prior authorization may be required for reimbursement for recommended care.  Comment: Ready    07/14/2013

## 2013-07-15 NOTE — Progress Notes (Signed)
Rehab admissions - Evaluated for possible admission.  I spoke with patient.  She would like inpatient rehab.  I have called and faxed information to [facility].  However, I need an OT evaluation with recommendations.  By Monday, patient may be doing well and not need an inpatient rehab stay.  I will follow up on Monday for progress and after OT eval is completed.  Call me for questions.  #[phone]

## 2013-07-15 NOTE — Progress Notes (Signed)
Physical Therapy Treatment Patient Details Name: [name] MRN: [id number] DOB: 1932/08/11 Today's Date: 07/15/2013 Time: 4098-1191 PT Time Calculation (min): 24 min  PT Assessment / Plan / Recommendation  History of Present Illness Pt admit for L5-S1 laminectomy and microdiscectomy with intraoperative dural tear.     PT Comments   Pt having significant problems with LE control bilaterally.  Emphasized safety, gait and education.  Needs some rehab prior to d/c home.    Follow Up Recommendations  CIR;Supervision/Assistance - 24 hour     Does the patient have the potential to tolerate intense rehabilitation     Barriers to Discharge        Equipment Recommendations       Recommendations for Other Services Rehab consult  Frequency Min 5X/week   Progress towards PT Goals Progress towards PT goals: Progressing toward goals  Plan Current plan remains appropriate    Precautions / Restrictions Precautions Precautions: Fall;Back Precaution Booklet Issued: Yes (comment) Precaution Comments: Pt able to recall 2/3 precautions with mod cues Restrictions Weight Bearing Restrictions: No   Pertinent Vitals/Pain     Mobility  Bed Mobility Bed Mobility: Not assessed Rolling Right: With rail;4: Min assist Rolling Left: 4: Min assist;With rail Sit to Sidelying Right: 3: Mod assist;HOB flat Details for Bed Mobility Assistance: Pt requires cues and assist for technque and back precautions. Assist needed to lift legs on bed Transfers Transfers: Sit to Stand;Stand to Sit Sit to Stand: 1: +2 Total assist;With upper extremity assist;From bed;From chair/3-in-1 Sit to Stand: Patient Percentage: 40% (to 50%) Stand to Sit: 1: +2 Total assist;With upper extremity assist;To chair/3-in-1;To toilet Stand to Sit: Patient Percentage: 50% Details for Transfer Assistance: Pt with heavy posterior lean in standing.  Tends to sit with uncontrolled descent Ambulation/Gait Ambulation/Gait  Assistance: 1: +2 Total assist Ambulation/Gait: Patient Percentage: 50% (to 40%) Ambulation Distance (Feet): 40 Feet (times 2) Assistive device: Rolling walker Ambulation/Gait Assistance Details: unsteady gait with frequent episodes of buckling knees, posterior lean leading to flexed knees bil.  shufflin, dragging feet R greater than L. Gait Pattern: Step-to pattern;Lateral hip instability;Shuffle;Decreased stride length;Decreased hip/knee flexion - left Gait velocity: decreased Stairs: No Wheelchair Mobility Wheelchair Mobility: No    Exercises     PT Diagnosis:    PT Problem List:   PT Treatment Interventions:     PT Goals (current goals can now be found in the care plan section) Acute Rehab PT Goals Patient Stated Goal: to go home  PT Goal Formulation: With patient Time For Goal Achievement: 07/27/13 Potential to Achieve Goals: Good  Visit Information  Assistance Needed: +2 (for safety) History of Present Illness: Pt admit for L5-S1 laminectomy and microdiscectomy with intraoperative dural tear.      Subjective Data  Patient Stated Goal: to go home    Cognition  Cognition Arousal/Alertness: Awake/alert Behavior During Therapy: WFL for tasks assessed/performed Overall Cognitive Status: Within Functional Limits for tasks assessed    Balance  Balance Balance Assessed: No Static Sitting Balance Static Sitting - Balance Support: Feet supported;Right upper extremity supported;Left upper extremity supported Static Sitting - Level of Assistance: 6: Modified independent (Device/Increase time) Static Sitting - Comment/# of Minutes: eating lunch at EOB over 30 min  End of Session PT - End of Session Equipment Utilized During Treatment: Gait belt Activity Tolerance: Patient limited by fatigue;Other (comment) (and weakness) Patient left: with call bell/phone within reach (on [facility] ) Nurse Communication: Mobility status   GP     Karron Goens, Eliseo Gum  07/15/2013, 4:52  PM 07/15/2013  [place], PT [phone]  (pager)

## 2013-07-15 NOTE — Evaluation (Addendum)
Occupational Therapy Evaluation Patient Details Name: [name] MRN: [id number] DOB: 1931-12-04 Today's Date: 07/15/2013 Time: 4098-1191 OT Time Calculation (min): 20 min  OT Assessment / Plan / Recommendation  Pt admit for L5-S1 laminectomy and microdiscectomy with intraoperative dural tear.      History of present illness  Patient is s/p laminectomy and microdiscectomy surgery resulting in the deficits listed below (see OT Problem List). Pt is moving slowly.  Currently requires total A +2 for all functional mobility and max - total A for LB ADLs.   Patient will benefit from skilled OT to increase their safety and independence with ADL and functional mobility for ADL (while adhering to their precautions) to facilitate discharge to venue listed below.         OT Assessment    Patient needs continued OT Services    Follow Up Recommendations  CIR;Supervision/Assistance - 24 hour    Barriers to Discharge Decreased caregiver support    Equipment Recommendations  None recommended by OT;3 in 1 bedside comode    Recommendations for Other Services Rehab consult  Frequency  Min 2X/week    Precautions / Restrictions Precautions Precautions: Fall;Back Precaution Booklet Issued: Yes (comment) Precaution Comments: Pt able to recall 2/3 precautions with mod cues Restrictions Weight Bearing Restrictions: No   Pertinent Vitals/Pain     ADL  Eating/Feeding: Set up Where Assessed - Eating/Feeding: Chair Grooming: Wash/dry hands;Wash/dry face;Teeth care;Brushing hair;Set up Where Assessed - Grooming: Supported sitting Upper Body Bathing: Supervision/safety Where Assessed - Upper Body Bathing: Supported sit to stand Lower Body Bathing: Maximal assistance Where Assessed - Lower Body Bathing: Supported sit to stand Upper Body Dressing: Supervision/safety Where Assessed - Upper Body Dressing: Unsupported sitting Lower Body Dressing: +1 Total assistance Where Assessed - Lower Body  Dressing: Supported sit to Pharmacist, hospital: +2 Total assistance Toilet Transfer: Patient Percentage: 40% Toilet Transfer Method: Sit to stand;Stand pivot Acupuncturist: Comfort height toilet Toileting - Clothing Manipulation and Hygiene: +1 Total assistance Where Assessed - Engineer, mining and Hygiene: Standing Tub/Shower Transfer: +1 Total assistance (unable) Equipment Used: Rolling walker Transfers/Ambulation Related to ADLs: total A +2 (pt ~40%) ADL Comments: Pt unable to access LB ADLs.  Needs step by step cues to adhere to back precautions.  Pt leans heavily posteriorly in standing     OT Diagnosis: Generalized weakness;Acute pain  OT Problem List: Decreased strength;Decreased activity tolerance;Impaired balance (sitting and/or standing);Decreased safety awareness;Decreased knowledge of use of DME or AE;Decreased knowledge of precautions;Pain OT Treatment Interventions: Self-care/ADL training;DME and/or AE instruction;Therapeutic activities;Patient/family education;Balance training   OT Goals(Current goals can be found in the care plan section) Acute Rehab OT Goals Patient Stated Goal: to go home  OT Goal Formulation: With patient Time For Goal Achievement: 07/22/13 Potential to Achieve Goals: Good ADL Goals Pt Will Perform Lower Body Bathing: with min assist;sit to/from stand Pt Will Perform Lower Body Dressing: with min assist;sit to/from stand;with adaptive equipment Pt Will Transfer to Toilet: with min assist;ambulating;bedside commode Pt Will Perform Toileting - Clothing Manipulation and hygiene: with min assist;sit to/from stand Additional ADL Goal #1: Pt will be independent with back precautions  Visit Information  Last OT Received On: 07/15/13 Assistance Needed: +2 History of Present Illness: Pt admit for L5-S1 laminectomy and microdiscectomy with intraoperative dural tear.         Prior Functioning     Home Living Family/patient  expects to be discharged to:: Private residence Living Arrangements: Alone Available Help at Discharge: Family;Available PRN/intermittently  Type of Home: House Home Access: Stairs to enter [employer] of Steps: 1 Entrance Stairs-Rails: None Home Layout: One level Home Equipment: Walker - 4 wheels;Shower seat;Bedside commode;Hand held shower head;Grab bars - tub/shower Additional Comments: Pt very deconditioned following surgery.  States that her daughter can stay with her a few days on d/c.  Pt reports that she falls at home all the time.   Prior Function Level of Independence: Independent with assistive device(s) Communication Communication: No difficulties Dominant Hand: Right         Vision/Perception     Cognition  Cognition Arousal/Alertness: Awake/alert Behavior During Therapy: WFL for tasks assessed/performed Overall Cognitive Status: Within Functional Limits for tasks assessed (Pt anxious, talkative, and self distracts.  Likely baseline)    Extremity/Trunk Assessment Upper Extremity Assessment Upper Extremity Assessment: Overall WFL for tasks assessed Cervical / Trunk Assessment Cervical / Trunk Assessment: Kyphotic     Mobility Bed Mobility Bed Mobility: Rolling Right;Rolling Left;Sit to Sidelying Right Rolling Right: With rail;4: Min assist Rolling Left: 4: Min assist;With rail Sit to Sidelying Right: 3: Mod assist;HOB flat Details for Bed Mobility Assistance: Pt requires cues and assist for technque and back precautions. Assist needed to lift legs on bed Transfers Transfers: Sit to Stand;Stand to Sit Sit to Stand: 1: +2 Total assist;From chair/3-in-1;With upper extremity assist Sit to Stand: Patient Percentage: 40% Stand to Sit: To chair/3-in-1;With armrests;To bed;1: +2 Total assist Stand to Sit: Patient Percentage: 50% Details for Transfer Assistance: Pt with heavy posterior lean in standing.  Tends to sit with uncontrolled descent      Exercise     Balance Static Sitting Balance Static Sitting - Balance Support: Feet supported;Bilateral upper extremity supported Static Sitting - Level of Assistance: 5: Stand by assistance   End of Session OT - End of Session Equipment Utilized During Treatment: Rolling walker Activity Tolerance: Patient tolerated treatment well Patient left: in bed;with call bell/phone within reach;with family/visitor present Nurse Communication: Mobility status  GO     Makiah Clauson M 07/15/2013, 4:01 PM

## 2013-07-15 NOTE — Progress Notes (Signed)
Attempted to mobilize the patient twice during the shift, But pain on legs limited the movement. Foley d/cd and voided already.

## 2013-07-16 NOTE — Progress Notes (Signed)
Physical Therapy Treatment Patient Details Name: [name] MRN: [id number] DOB: 1932/02/01 Today's Date: 07/16/2013 Time: 1550-1630 PT Time Calculation (min): 40 min  PT Assessment / Plan / Recommendation  History of Present Illness Pt admit for L5-S1 laminectomy and microdiscectomy with intraoperative dural tear.     PT Comments   Emphasis on mechanics of sit to stand and standing upright in good posture   Follow Up Recommendations  CIR     Does the patient have the potential to tolerate intense rehabilitation     Barriers to Discharge        Equipment Recommendations       Recommendations for Other Services    Frequency Min 5X/week   Progress towards PT Goals Progress towards PT goals: Progressing toward goals  Plan Current plan remains appropriate    Precautions / Restrictions Precautions Precautions: Fall;Back Restrictions Weight Bearing Restrictions: No   Pertinent Vitals/Pain     Mobility  Bed Mobility Bed Mobility: Not assessed Transfers Transfers: Sit to Stand;Stand to Sit Sit to Stand: Other (comment);3: Mod assist;2: Max assist;With upper extremity assist;From bed;From chair/3-in-1 (times 6) Stand to Sit: 3: Mod assist;With upper extremity assist;To bed;To chair/3-in-1 Details for Transfer Assistance: Worked of biomechanics/technique to make the most of her weaknesses;  truncal assist Ambulation/Gait Ambulation/Gait Assistance: 1: +2 Total assist Ambulation/Gait: Patient Percentage: 40% (to 50%) Ambulation Distance (Feet): 35 Feet (times 2) Assistive device: Rolling walker Ambulation/Gait Assistance Details:   unsteady gait with frequent episodes of buckling knees, posterior lean leading to flexed knees bil. shufflin, dragging feet R greater than L.  Gait Pattern: Step-to pattern;Lateral hip instability;Shuffle;Decreased stride length;Decreased hip/knee flexion - left Gait velocity: decreased Stairs: No Wheelchair Mobility Wheelchair Mobility: No     Exercises     PT Diagnosis:    PT Problem List:   PT Treatment Interventions:     PT Goals (current goals can now be found in the care plan section) Acute Rehab PT Goals PT Goal Formulation: With patient Time For Goal Achievement: 07/27/13 Potential to Achieve Goals: Good  Visit Information  Last PT Received On: 07/16/13 Assistance Needed: +1 History of Present Illness: Pt admit for L5-S1 laminectomy and microdiscectomy with intraoperative dural tear.      Subjective Data  Subjective: I've been weak for a while   Cognition  Cognition Arousal/Alertness: Awake/alert Behavior During Therapy: WFL for tasks assessed/performed Overall Cognitive Status: Within Functional Limits for tasks assessed    Balance  Static Sitting Balance Static Sitting - Level of Assistance: 5: Stand by assistance  End of Session PT - End of Session Activity Tolerance: Patient limited by fatigue;Patient limited by pain Nurse Communication: Mobility status   GP     Janene Yousuf, Eliseo Gum 07/16/2013, 4:56 PM  07/16/2013  Schurz Bing, PT [phone]  (pager)

## 2013-07-16 NOTE — Progress Notes (Addendum)
Subjective: Patient reports She's doing better better hip pain better back pain still slow to mobilize continue to work with therapy she is tolerating a regular diet and voiding spontaneously and passing gas.  Objective: Vital signs in last 24 hours: Temp:  [98.1 F (36.7 C)-99.2 F (37.3 C)] 99.2 F (37.3 C) (08/23 0548) Pulse Rate:  [71-83] 71 (08/23 0548) Resp:  [18] 18 (08/23 0548) BP: (120-148)/(31-78) 128/31 mmHg (08/23 0548) SpO2:  [94 %-98 %] 97 % (08/23 0548)  Intake/Output from previous day: 08/22 0701 - 08/23 0700 In: 120 [P.O.:120] Out: -  Intake/Output this shift:    Strength 5 out of 5 wound clean and dry  Lab Results: No results found for this basename: WBC, HGB, HCT, PLT,  in the last 72 hours BMET No results found for this basename: NA, K, CL, CO2, GLUCOSE, BUN, CREATININE, CALCIUM,  in the last 72 hours  Studies/Results: No results found.  Assessment/Plan: Posterior day 5 from a lumbar laminectomy discectomy overall she's doing fairly well no headaches no signs of CSF leak she is mobilizing well the pain is still very difficult thing to control for her with her back and hips primarily she'll continue to work with therapy and still awaiting word on placement but she is improving as possible the patient to go home next week. However should it become available she is stable enough for transfer to rehabilitation at any point.  LOS: 5 days     Calen Posch P 07/16/2013, 8:27 AM

## 2013-07-17 NOTE — Progress Notes (Signed)
Physical Therapy Treatment Patient Details Name: [name] MRN: [id number] DOB: May 18, 1932 Today's Date: 07/17/2013 Time: 4098-1191 PT Time Calculation (min): 30 min  PT Assessment / Plan / Recommendation  History of Present Illness     PT Comments   Continue to recommend comprehensive inpatient rehab (CIR) for post-acute therapy needs.   Follow Up Recommendations  CIR     Does the patient have the potential to tolerate intense rehabilitation     Barriers to Discharge        Equipment Recommendations  None recommended by PT    Recommendations for Other Services Rehab consult  Frequency Min 5X/week   Progress towards PT Goals Progress towards PT goals: Progressing toward goals  Plan Current plan remains appropriate    Precautions / Restrictions Precautions Precautions: Fall;Back Precaution Comments: Reviewed 3/3 back precautions.   Pertinent Vitals/Pain 3/10    Mobility  Bed Mobility Rolling Left: 4: Min assist;With rail Sit to Sidelying Right: 3: Mod assist;HOB flat Details for Bed Mobility Assistance: verbal cues for technique/precautions, assist with BLE Transfers Sit to Stand: 3: Mod assist;From chair/3-in-1;With armrests Stand to Sit: 3: Mod assist;To bed;To chair/3-in-1;With upper extremity assist Details for Transfer Assistance: verbal cues for sequencing, assist/cues for forward weight shift and posture Ambulation/Gait Ambulation/Gait Assistance: 3: Mod assist Ambulation Distance (Feet): 10 Feet Assistive device: Rolling walker Ambulation/Gait Assistance Details: unsteady, knee buckling x 1 with mod assist to recover, retropulsive requiring continuous tactile/verbal cues Gait Pattern: Step-to pattern;Decreased stride length Gait velocity: decreased    Exercises     PT Diagnosis:    PT Problem List:   PT Treatment Interventions:     PT Goals (current goals can now be found in the care plan section)    Visit Information  Last PT Received On:  07/17/13 Assistance Needed: +1 (+2 for ambulation)    Subjective Data      Cognition  Cognition Arousal/Alertness: Awake/alert Behavior During Therapy: WFL for tasks assessed/performed Overall Cognitive Status: Within Functional Limits for tasks assessed    Balance     End of Session PT - End of Session Equipment Utilized During Treatment: Gait belt Activity Tolerance: Patient limited by fatigue Patient left: in bed;with call bell/phone within reach   GP     Ilda Foil 07/17/2013, 2:59 PM  Aida Raider, PT  Office # [phone] Pager [phone]

## 2013-07-17 NOTE — Progress Notes (Signed)
Patient ID: [name], female   DOB: [birth year], 79 y.o.   MRN: [id number] BP 147/50  Pulse 76  Temp(Src) 99.4 F (37.4 C) (Oral)  Resp 18  Ht 5\' 3"  (1.6 m)  Wt 79.652 kg (175 lb 9.6 oz)  BMI 31.11 kg/m2  SpO2 93% Alert and oriented x 4 Moving lower extremities Still with back pain-assured her this is expected and will improve Working hard with physical therapy Slow but steady progress

## 2013-07-18 NOTE — Clinical Social Work Placement (Signed)
Clinical Social Work Department CLINICAL SOCIAL WORK PLACEMENT NOTE 07/18/2013  Patient:  [name], [name]  Account Number:  [id number] Admit date:  07/11/2013  Clinical Social Worker:  Irving Burton [place], LCSWA  Date/time:  07/18/2013 09:22 PM  Clinical Social Work is seeking post-discharge placement for this patient at the following level of care:   SKILLED NURSING   (*CSW will update this form in Epic as items are completed)   07/18/2013  Patient/family provided with [facility] Department of Clinical Social Works list of facilities offering this level of care within the geographic area requested by the patient (or if unable, by the patients family).  07/18/2013  Patient/family informed of their freedom to choose among providers that offer the needed level of care, that participate in Medicare, Medicaid or managed care program needed by the patient, have an available bed and are willing to accept the patient.  07/18/2013  Patient/family informed of [facility] ownership interest in [facility], as well as of the fact that they are under no obligation to receive care at this facility.  PASARR submitted to EDS on 07/18/2013 PASARR number received from [employer] on 07/18/2013  FL2 transmitted to all facilities in geographic area requested by pt/family on  07/18/2013 FL2 transmitted to all facilities within larger geographic area on   Patient informed that his/her managed care company has contracts with or will negotiate with  certain facilities, including the following:     Patient/family informed of bed offers received:   Patient chooses bed at  Physician recommends and patient chooses bed at    Patient to be transferred to  on   Patient to be transferred to facility by   The following physician request were entered in Epic:   Additional Comments:  Darlyn Chamber, MSW, LCSWA Clinical Social Work [phone]

## 2013-07-18 NOTE — Clinical Social Work Psychosocial (Signed)
Clinical Social Work Department BRIEF PSYCHOSOCIAL ASSESSMENT 07/18/2013  Patient:  [name], [name]     Account Number:  [id number]     Admit date:  07/11/2013  Clinical Social Worker:  Sherre Lain  Date/Time:  07/18/2013 09:18 PM  Referred by:  Physician  Date Referred:  07/18/2013 Referred for  SNF Placement   Other Referral:   none.   Interview type:  Patient Other interview type:   Pt's daughter and grandson were present in the room, as well.    PSYCHOSOCIAL DATA Living Status:  ALONE Admitted from facility:   Level of care:   Primary support name:  Pt's daughter and son. Primary support relationship to patient:   Degree of support available:   Strong.    CURRENT CONCERNS Current Concerns  Post-Acute Placement   Other Concerns:   none.    SOCIAL WORK ASSESSMENT / PLAN CSW met with pt and pt's daughter at bedside. Pt stated that prior to admission to [facility], pt was living at home alone and caring for herself. Pt stated that she was agreeable to SNF placement, but wanted to talk with her son and daughter about her SNF placement options. CSW to continue to follow and assist with pt discharge planning needs.   Assessment/plan status:  Psychosocial Support/Ongoing Assessment of Needs Other assessment/ plan:   none.   Information/referral to community resources:   [place], [place] SNF placement.    PATIENTS/FAMILYS RESPONSE TO PLAN OF CARE: Pt was understanding and agreeable to CSW plan of care.       Darlyn Chamber, MSW, LCSWA Clinical Social Work [phone]

## 2013-07-18 NOTE — Progress Notes (Signed)
Rehab admissions - I have received a denial from [facility] for acute inpatient rehab admission.  Per insurance, no medical necessity to support inpatient rehab stay.  Patient would be able to go to a SNF.  Can call Jethro Bastos at [phone] about SNF approval.  Call me for questions.  #[phone]

## 2013-07-18 NOTE — Progress Notes (Signed)
Rehab admissions - I have faxed PT and OT updates to [facility].  Awaiting call back from insurance case manager regarding possible acute inpatient rehab admission.  Call me for questions.  #[phone]

## 2013-07-18 NOTE — Progress Notes (Signed)
Patient ID: [name], female   DOB: [birth year], 79 y.o.   MRN: [id number] Patient is going well she is afebrile stable vitals signs pain is under better control still very slow to mobilize awaiting placement strength is 5 out of 5 wound is clean and dry

## 2013-07-18 NOTE — Progress Notes (Signed)
Physical Therapy Treatment Patient Details Name: [name] MRN: [id number] DOB: [birth year] Today's Date: 07/18/2013 Time: 1710-1734 PT Time Calculation (min): 24 min  PT Assessment / Plan / Recommendation  History of Present Illness Pt admit for L5-S1 laminectomy and microdiscectomy with intraoperative dural tear.     PT Comments   Noticeable improvement for last treatment.  Pt showed better control at her knees, improved positioning of L LE during gait, but did quickly fatigue with gait degradation esp with L hip drop.   Follow Up Recommendations  SNF;Other (comment) (insurance denied CIR)     Does the patient have the potential to tolerate intense rehabilitation     Barriers to Discharge        Equipment Recommendations  None recommended by PT    Recommendations for Other Services    Frequency Min 5X/week   Progress towards PT Goals Progress towards PT goals: Progressing toward goals  Plan Current plan remains appropriate    Precautions / Restrictions Precautions Precautions: Fall;Back Precaution Comments: Reviewed 3/3 back precautions. Restrictions Weight Bearing Restrictions: No   Pertinent Vitals/Pain     Mobility  Bed Mobility Bed Mobility: Not assessed Transfers Transfers: Sit to Stand;Stand to Sit Sit to Stand: 3: Mod assist;With upper extremity assist;From chair/3-in-1 Sit to Stand: Patient Percentage: 70% Stand to Sit: 3: Mod assist;With upper extremity assist;To chair/3-in-1 Stand to Sit: Patient Percentage: 70% Details for Transfer Assistance: v/tc's for improving her mechanics;  lifting and steadying assist, help to come forward Ambulation/Gait Ambulation/Gait Assistance: 3: Mod assist Ambulation/Gait: Patient Percentage: 70% Ambulation Distance (Feet): 70 Feet (times 2) Assistive device: Rolling walker Ambulation/Gait Assistance Details: Control much improved with pt able to keep her left leg in  neutral rotation, minimal but improved knee  flexion without many episodes of knee buckling.  As she fatigued, her left hip dropped and needed more truncal support. Gait Pattern: Step-through pattern;Narrow base of support Gait velocity: decreased Stairs: No Wheelchair Mobility Wheelchair Mobility: No    Exercises     PT Diagnosis:    PT Problem List:   PT Treatment Interventions:     PT Goals (current goals can now be found in the care plan section) Acute Rehab PT Goals PT Goal Formulation: With patient Time For Goal Achievement: 07/27/13 Potential to Achieve Goals: Good  Visit Information  Last PT Received On: 07/18/13 Assistance Needed: +1 History of Present Illness: Pt admit for L5-S1 laminectomy and microdiscectomy with intraoperative dural tear.      Subjective Data  Subjective: I'm walking and it doesn't hurt so bad.   Cognition  Cognition Arousal/Alertness: Awake/alert Behavior During Therapy: WFL for tasks assessed/performed Overall Cognitive Status: Within Functional Limits for tasks assessed    Balance  Balance Balance Assessed: No  End of Session PT - End of Session Activity Tolerance: Patient tolerated treatment well Patient left: in chair;with call bell/phone within reach;with family/visitor present Nurse Communication: Mobility status   GP     Brand Siever, Eliseo Gum 07/18/2013, 5:48 PM 07/18/2013  [place] Bing, PT [phone]  (pager)

## 2013-07-19 NOTE — Progress Notes (Signed)
Patient ID: [name], female   DOB: 1932/01/14, 79 y.o.   MRN: [id number] Much better today yesterday minimal back and leg pain still awaiting placement

## 2013-07-19 NOTE — Progress Notes (Signed)
Physical Therapy Treatment Patient Details Name: [name] MRN: [id number] DOB: Dec 24, 1931 Today's Date: 07/19/2013 Time: 4098-1191 PT Time Calculation (min): 23 min  PT Assessment / Plan / Recommendation  History of Present Illness Pt admit for L5-S1 laminectomy and microdiscectomy with intraoperative dural tear.     PT Comments   Overall improved from previous days other than 8/25.  Today, pt hurting more.  Emphasis on bed mobility   Follow Up Recommendations  SNF;Other (comment)     Does the patient have the potential to tolerate intense rehabilitation     Barriers to Discharge        Equipment Recommendations  None recommended by PT    Recommendations for Other Services    Frequency Min 5X/week   Progress towards PT Goals Progress towards PT goals: Progressing toward goals  Plan Current plan remains appropriate    Precautions / Restrictions Precautions Precautions: Fall;Back Precaution Comments: Reviewed 3/3 back precautions. Restrictions Weight Bearing Restrictions: No Other Position/Activity Restrictions: `   Pertinent Vitals/Pain     Mobility  Bed Mobility Bed Mobility: Left Sidelying to Sit;Rolling Left;Rolling Right Rolling Right: 5: Supervision Rolling Left: 5: Supervision Left Sidelying to Sit: 5: Supervision Sitting - Scoot to Edge of Bed: 5: Supervision Details for Bed Mobility Assistance: verbal cues for technique/precautions, assist with BLE Transfers Transfers: Sit to Stand;Stand to Sit Sit to Stand: 4: Min assist;With upper extremity assist;From bed Sit to Stand: Patient Percentage:  (75%) Stand to Sit: 4: Min assist;To bed Details for Transfer Assistance: v/tc's for improving her mechanics;  lifting and steadying assist, help to come forward Ambulation/Gait Ambulation/Gait Assistance: 3: Mod assist Ambulation Distance (Feet): 70 Feet Assistive device: Rolling walker Ambulation/Gait Assistance Details: not as much control as previous day.   L LE internally rotating and BOS very narrow. Gait Pattern: Step-through pattern;Narrow base of support Gait velocity: decreased Stairs: No    Exercises     PT Diagnosis:    PT Problem List:   PT Treatment Interventions:     PT Goals (current goals can now be found in the care plan section) Acute Rehab PT Goals PT Goal Formulation: With patient Time For Goal Achievement: 07/27/13 Potential to Achieve Goals: Good  Visit Information  Last PT Received On: 07/19/13 Assistance Needed: +1 History of Present Illness: Pt admit for L5-S1 laminectomy and microdiscectomy with intraoperative dural tear.      Subjective Data      Cognition  Cognition Arousal/Alertness: Awake/alert Behavior During Therapy: WFL for tasks assessed/performed Overall Cognitive Status: Within Functional Limits for tasks assessed    Balance     End of Session PT - End of Session Activity Tolerance: Patient tolerated treatment well;Patient limited by fatigue Patient left: with call bell/phone within reach;with family/visitor present;in bed Nurse Communication: Mobility status   GP     Rayelynn Loyal, Eliseo Gum 07/19/2013, 3:32 PM 07/19/2013  [place] Bing, PT [phone]  (pager)

## 2013-07-20 MED ORDER — HYDROCODONE-ACETAMINOPHEN 5-325 MG PO TABS: 1.0000 | ORAL_TABLET | Freq: Four times a day (QID) | ORAL | Status: DC | PRN

## 2013-07-20 NOTE — Discharge Summary (Signed)
Physician Discharge Summary  Patient ID: GLENOLA WHEAT MRN: [id number] DOB/AGE: Apr 10, 1932 79 y.o.  Admit date: 07/11/2013 Discharge date: 07/20/2013  Admission Diagnoses: Lumbar spondylosis stenosis and HNP  Discharge Diagnoses: Same Active Problems:   * No active hospital problems. *   Discharged Condition: good  Hospital Course: Patient is in the hospital underwent lumbar laminectomy discectomy and initially presented very well however patient a lot of difficulties with pain control very slow to mobilize I was unable to get out of bed and in no ambulate most this is because of back pain hip pain she had complete resolution immediately of her preoperative radicular symptoms but due to her age and multiple comorbidities she was very slow to mobilize she ultimately was observed to 40 hours in the outpatient unit bed to transfer the floor physical therapy work with her recommended placement tissue is actually progress immobilize in the next several days and was stable for placement on hospital day 9 was scheduled followup approximately 2 weeks. She'll she can take Flexeril lodged she's receive physical outpatient therapy she's not was ellipsed then twist she is to avoid driving and does work with her physical outpatient therapy.  Consults: Significant Diagnostic Studies: Treatments: Lumbar limiting microdiscectomy Discharge Exam: Blood pressure 119/70, pulse 76, temperature 97.5 F (36.4 C), temperature source Oral, resp. rate 18, height 5\' 3"  (1.6 m), weight 79.652 kg (175 lb 9.6 oz), SpO2 100.00%. Strength out of 5 wound clean and dry  Disposition: Home   Future Appointments Provider Department Dept Phone   11/02/2013 11:30 AM Gordy Savers, MD [facility] [place] [phone]       Medication List         aspirin 81 MG tablet  Take 81 mg by mouth daily.     calcium carbonate 500 MG chewable tablet  Commonly known as:  TUMS - dosed in mg elemental calcium   Chew 2 tablets by mouth 2 (two) times daily.     cetirizine 10 MG tablet  Commonly known as:  ZYRTEC  Take 10 mg by mouth daily as needed. Alternating with Hydroxyzine  For allergies     folic acid 1 MG tablet  Commonly known as:  FOLVITE  Take 1 mg by mouth 2 (two) times daily.     furosemide 40 MG tablet  Commonly known as:  LASIX  Take 40 mg by mouth 2 (two) times a week. As needed for sweling     hydrochlorothiazide 25 MG tablet  Commonly known as:  HYDRODIURIL  Take 25 mg by mouth daily.     HYDROcodone-acetaminophen 5-325 MG per tablet  Commonly known as:  NORCO/VICODIN  Take 1-2 tablets by mouth every 6 (six) hours as needed.     HYDROcodone-acetaminophen 5-325 MG per tablet  Commonly known as:  NORCO/VICODIN  Take 1 tablet by mouth every 6 (six) hours as needed for pain.     hydroxychloroquine 200 MG tablet  Commonly known as:  PLAQUENIL  Take 200 mg by mouth 2 (two) times daily.     hydrOXYzine 25 MG tablet  Commonly known as:  ATARAX/VISTARIL  Take 25 mg by mouth every 4 (four) hours as needed. For allergies alternate with Zyrtec     KLOR-CON M20 20 MEQ tablet  Generic drug:  potassium chloride SA  Take 20 mEq by mouth 2 (two) times a week. As needed with Furosemide     methotrexate 2.5 MG tablet  Commonly known as:  RHEUMATREX  Take 15 mg  by mouth once a week. Caution:Chemotherapy. Protect from light.     metoprolol succinate 50 MG 24 hr tablet  Commonly known as:  TOPROL-XL  Take 50 mg by mouth daily.     simvastatin 40 MG tablet  Commonly known as:  ZOCOR  Take 40 mg by mouth at bedtime.     tobramycin-dexamethasone ophthalmic solution  Commonly known as:  TOBRADEX  Place 1 drop into both eyes every 4 (four) hours while awake.         Signed: Tailyn Hantz P 07/20/2013, 3:47 PM

## 2013-07-20 NOTE — Progress Notes (Signed)
Physical Therapy Treatment Patient Details Name: [name] MRN: [id number] DOB: 04/24/1932 Today's Date: 07/20/2013 Time: 4098-1191 PT Time Calculation (min): 24 min  PT Assessment / Plan / Recommendation  History of Present Illness Pt admit for L5-S1 laminectomy and microdiscectomy with intraoperative dural tear.     PT Comments   Improving daily with gait stability and control.  Emphasis on mechanics of safe transfer that incorporates a bit more lean forward than normally might allow, but pt has improved so much and we have stressed keeping back straight.  Follow Up Recommendations  SNF;Other (comment)     Does the patient have the potential to tolerate intense rehabilitation     Barriers to Discharge        Equipment Recommendations  None recommended by PT    Recommendations for Other Services    Frequency Min 5X/week   Progress towards PT Goals Progress towards PT goals: Progressing toward goals  Plan Current plan remains appropriate    Precautions / Restrictions Precautions Precautions: Fall;Back Precaution Booklet Issued: Yes (comment) Precaution Comments: Reviewed 3/3 back precautions. Restrictions Weight Bearing Restrictions: No   Pertinent Vitals/Pain     Mobility  Bed Mobility Bed Mobility: Not assessed Transfers Transfers: Sit to Stand;Stand to Sit Sit to Stand: 4: Min assist;With upper extremity assist;From bed;From toilet Stand to Sit: 4: Min assist;To bed;To toilet Details for Transfer Assistance: v/tc's for improving her mechanics;  lifting and steadying assist, help to come forward Ambulation/Gait Ambulation/Gait Assistance: 4: Min assist Ambulation/Gait: Patient Percentage: 80% Ambulation Distance (Feet): 15 Feet (times 2) Assistive device: Rolling walker Ambulation/Gait Assistance Details: Better control each day,  Practiced managing the RW in tight spaces Gait Pattern: Step-through pattern;Narrow base of support Gait velocity:  decreased Stairs: No Wheelchair Mobility Wheelchair Mobility: No    Exercises     PT Diagnosis:    PT Problem List:   PT Treatment Interventions:     PT Goals (current goals can now be found in the care plan section) Acute Rehab PT Goals PT Goal Formulation: With patient Time For Goal Achievement: 07/27/13 Potential to Achieve Goals: Good  Visit Information  Last PT Received On: 07/20/13 Assistance Needed: +1 History of Present Illness: Pt admit for L5-S1 laminectomy and microdiscectomy with intraoperative dural tear.      Subjective Data  Subjective: I've been sitting here since 11:30 wanting to go to the bathroom   Cognition  Cognition Arousal/Alertness: Awake/alert Behavior During Therapy: [facility] for tasks assessed/performed Overall Cognitive Status: Within Functional Limits for tasks assessed    Balance     End of Session PT - End of Session Activity Tolerance: Patient tolerated treatment well Patient left: with call bell/phone within reach;with family/visitor present;in bed (sitting EOB) Nurse Communication: Mobility status   GP     [place], Eliseo Gum 07/20/2013, 5:07 PM 07/20/2013  [place] Bing, PT [phone]  (pager)

## 2013-09-06 ENCOUNTER — Telehealth: Payer: Self-pay | Admitting: Internal Medicine

## 2013-09-06 NOTE — Telephone Encounter (Signed)
Pt needs new rx hydrocodone °

## 2013-09-07 MED ORDER — HYDROCODONE-ACETAMINOPHEN 5-325 MG PO TABS: 1.0000 | ORAL_TABLET | Freq: Four times a day (QID) | ORAL | Status: DC | PRN

## 2013-09-07 NOTE — Telephone Encounter (Signed)
ok 

## 2013-09-07 NOTE — Telephone Encounter (Signed)
Okay to fill Hydrocodone? Not sure if you prescribed.

## 2013-09-07 NOTE — Telephone Encounter (Signed)
Spoke to pt told her Rx ready for pick up. Pt verbalized understanding. Rx printed and signed and put at front desk for pick up. 

## 2013-09-27 ENCOUNTER — Ambulatory Visit (INDEPENDENT_AMBULATORY_CARE_PROVIDER_SITE_OTHER): Payer: Medicare Other

## 2013-09-27 DIAGNOSIS — Z23 Encounter for immunization: Secondary | ICD-10-CM

## 2013-09-29 ENCOUNTER — Telehealth: Payer: Self-pay | Admitting: Internal Medicine

## 2013-09-29 NOTE — Telephone Encounter (Signed)
ok 

## 2013-09-29 NOTE — Telephone Encounter (Signed)
Adriana Physical therapist from [facility] would like a order for psych nursing evaluation. Pt is demonstrating  signs of depression.

## 2013-09-30 NOTE — Telephone Encounter (Signed)
Left detailed message for verbal order for Psych nursing evaluation due to depression for patient.

## 2013-10-03 ENCOUNTER — Telehealth: Payer: Self-pay | Admitting: Internal Medicine

## 2013-10-03 NOTE — Telephone Encounter (Signed)
Pt request refill of  HYDROcodone-acetaminophen (NORCO/VICODIN) 5-325 MG per tablet ° °

## 2013-10-03 NOTE — Telephone Encounter (Signed)
Spoke to pt told her can not print Rx till Friday. Pt verbalized understanding.

## 2013-10-07 MED ORDER — HYDROCODONE-ACETAMINOPHEN 5-325 MG PO TABS: 1.0000 | ORAL_TABLET | Freq: Four times a day (QID) | ORAL | Status: DC | PRN

## 2013-10-07 NOTE — Telephone Encounter (Signed)
Rx ready for pick and patient is aware 

## 2013-11-01 ENCOUNTER — Encounter: Payer: Self-pay | Admitting: *Deleted

## 2013-11-02 ENCOUNTER — Encounter: Payer: Self-pay | Admitting: Internal Medicine

## 2013-11-02 ENCOUNTER — Ambulatory Visit (INDEPENDENT_AMBULATORY_CARE_PROVIDER_SITE_OTHER): Payer: Medicare Other | Admitting: Internal Medicine

## 2013-11-02 VITALS — BP 120/70 | HR 94 | Temp 97.7°F | Resp 20 | Wt 159.0 lb

## 2013-11-02 DIAGNOSIS — M25569 Pain in unspecified knee: Secondary | ICD-10-CM

## 2013-11-02 DIAGNOSIS — R269 Unspecified abnormalities of gait and mobility: Secondary | ICD-10-CM

## 2013-11-02 DIAGNOSIS — R2681 Unsteadiness on feet: Secondary | ICD-10-CM

## 2013-11-02 DIAGNOSIS — I25810 Atherosclerosis of coronary artery bypass graft(s) without angina pectoris: Secondary | ICD-10-CM

## 2013-11-02 DIAGNOSIS — M1990 Unspecified osteoarthritis, unspecified site: Secondary | ICD-10-CM

## 2013-11-02 DIAGNOSIS — E785 Hyperlipidemia, unspecified: Secondary | ICD-10-CM

## 2013-11-02 DIAGNOSIS — I10 Essential (primary) hypertension: Secondary | ICD-10-CM

## 2013-11-02 MED ORDER — HYDROCODONE-ACETAMINOPHEN 5-325 MG PO TABS: 1.0000 | ORAL_TABLET | Freq: Four times a day (QID) | ORAL | Status: DC | PRN

## 2013-11-02 MED ORDER — TRAMADOL HCL 50 MG PO TABS: 50.0000 mg | ORAL_TABLET | Freq: Three times a day (TID) | ORAL | Status: DC | PRN

## 2013-11-02 NOTE — Progress Notes (Signed)
Pre-visit discussion using our clinic review tool. No additional management support is needed unless otherwise documented below in the visit note.  

## 2013-11-02 NOTE — Patient Instructions (Signed)
Limit your sodium (Salt) intake    It is important that you exercise regularly, at least 20 minutes 3 to 4 times per week.  If you develop chest pain or shortness of breath seek  medical attention.  Return in 6 months for follow-up  

## 2013-11-02 NOTE — Progress Notes (Signed)
Subjective:    Patient ID: [name], female    DOB: 12-Dec-1931, 79 y.o.   MRN: [id number]  HPI Pre-visit discussion using our clinic review tool. No additional management support is needed unless otherwise documented below in the visit note.  79 year old patient who is seen today for her biannual followup.  Since her last visit here she has had a laminectomy. She has completed physical therapy.  She has a history of advanced DJD as well as are any and is followed by rheumatology. She still has significant pain especially involving both knees. She remains ambulatory with the assistance of a walker. She has a history of hypertension which has been well controlled. She is on diuretic therapy and uses furosemide when necessary peripheral edema which is very infrequent She has CAD and is status post CABG. No recent falls. She is scheduled for neurosurgical and rheumatologic followup soon. Remains on simvastatin for dyslipidemia which he continues to tolerate well.  Her main concern is pain management. She states that she wishes to be off hydrocodone but does have chronic daily pain.  At times she also asks whether she can not take the hydrocodone every 4 hours due to worsening pain at that interval.  Wt Readings from Last 3 Encounters:  11/02/13 159 lb (72.122 kg)  07/11/13 175 lb 9.6 oz (79.652 kg)  07/11/13 175 lb 9.6 oz (79.652 kg)    Past Medical History  Diagnosis Date  . DEPRESSION 05/21/2007  . FIBROCYSTIC BREAST DISEASE, HX OF 05/21/2007  . HYPERLIPIDEMIA-MIXED 11/26/2008    takes Simvastatin daily  . Irritable bowel syndrome 05/21/2007  . KNEE PAIN 09/16/2007  . LEG EDEMA, BILATERAL 05/21/2009  . OSTEOPENIA 05/21/2007  . URTICARIA, CHRONIC 05/21/2007  . History of aortic aneurysm     resection and grafting of a 5.2-cm ascending aortic arch aneurysm August 2009  . Dyslipidemia   . Vitamin D deficiency   . Urticaria   . Neuropathy     left radial  neuropathy with wrist drop    . Diverticulosis   . Irritable bowel syndrome   . GERD (gastroesophageal reflux disease)   . 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 Vaginal yeast infection     recently told to use monistat, hasn't started as of 01/26/2012  . HYPERTENSION 05/11/2008    takes Metoprolol and HCTZ daily  . Asthma     hx of;as a child  . Seasonal allergies     takes Hydroxyzine prn and Zyrtec 2 times a week  . Peripheral edema     takes Furosemide prn  . Pneumonia     history of;in the 90's  . Weakness     hands and feet  . Sciatica   . OSTEOARTHRITIS 01/20/2008    takes Methotrexate weekly  . DJD (degenerative joint disease)     history of   . Rheumatoid arthritis(714.0)   . Chronic back pain     takes Norco prn  . Bruises easily     takes Plaquenil daily  . CORONARY ATHEROSCLEROSIS, ARTERY BYPASS GRAFT 08/23/2008  . CAD (coronary artery disease)     s/p CABG -  x 1 with LIMA to LAD August 2009 /   . Urinary incontinence   . Bowel incontinence   . History of colon polyps   . Urinary frequency   . Urinary urgency   . Nocturia   . Anemia     takes folic acid bid  .  Urinary incontinence     wears depends    History   Social History  . Marital Status: Widowed    Spouse Name: N/A    Number of Children: N/A  . Years of Education: N/A   Occupational History  . Not on file.   Social History Main Topics  . Smoking status: Never Smoker   . Smokeless tobacco: Not on file  . Alcohol Use: No  . Drug Use: No  . Sexual Activity: No   Other Topics Concern  . Not on file   Social History Narrative  . No narrative on file    Past Surgical History  Procedure Laterality Date  . Appendectomy    . Cesarean section  1968    x 1  . Hernia repair    . Coronary artery bypass graft  August 2009    CABG x 1 with LIMA to LAD /  . Ascending aortic aneurysm repair  August 2009     resection and grafting of a 5.2-cm ascending aortic arch aneurysm  . Joint replacement      2009 &  2010- both knees  . Back surgery      2011- cerv. fusion   . Breast surgery      biopsy x2- R breast  . Tonsillectomy      as a child  . Cardiac catheterization  2009  . Colonoscopy    . Lumbar laminectomy/decompression microdiscectomy Bilateral 07/11/2013    Procedure: LUMBAR LAMINECTOMY/DECOMPRESSION MICRODISCECTOMY LUMBAR FIVE SACRAL ONE;  Surgeon: Mariam Dollar, MD;  Location: [facility];  Service: Neurosurgery;  Laterality: Bilateral;    Family History  Problem Relation Age of Onset  . Heart disease    . Coronary artery disease    . Anesthesia problems Neg Hx   . Hypotension Neg Hx   . Malignant hyperthermia Neg Hx   . Pseudochol deficiency Neg Hx   . Heart disease Mother   . Stroke Father   . Cancer Maternal Grandmother   . Stroke Paternal Grandmother     Allergies  Allergen Reactions  . Amoxicillin     REACTION: unspecified  . Cyclobenzaprine Hcl     REACTION: rash  . Mupirocin     Unknown reaction  . Penicillins Hives  . Tetanus Toxoid Hives    Current Outpatient Prescriptions on File Prior to Visit  Medication Sig Dispense Refill  . aspirin 81 MG tablet Take 81 mg by mouth daily.       . calcium carbonate (TUMS - DOSED IN MG ELEMENTAL CALCIUM) 500 MG chewable tablet Chew 2 tablets by mouth 2 (two) times daily.       . cetirizine (ZYRTEC) 10 MG tablet Take 10 mg by mouth daily as needed. Alternating with Hydroxyzine  For allergies      . folic acid (FOLVITE) 1 MG tablet Take 1 mg by mouth 2 (two) times daily.        . furosemide (LASIX) 40 MG tablet Take 40 mg by mouth 2 (two) times a week. As needed for sweling      . hydrochlorothiazide (HYDRODIURIL) 25 MG tablet Take 25 mg by mouth daily.      . hydroxychloroquine (PLAQUENIL) 200 MG tablet Take 200 mg by mouth 2 (two) times daily.       . hydrOXYzine (ATARAX/VISTARIL) 25 MG tablet Take 25 mg by mouth every 4 (four) hours as needed. For allergies alternate with Zyrtec      . methotrexate (RHEUMATREX)  2.5 MG  tablet Take 15 mg by mouth once a week. Caution:Chemotherapy. Protect from light.      . metoprolol succinate (TOPROL-XL) 50 MG 24 hr tablet Take 50 mg by mouth daily.       . potassium chloride SA (KLOR-CON M20) 20 MEQ tablet Take 20 mEq by mouth 2 (two) times a week. As needed with Furosemide      . simvastatin (ZOCOR) 40 MG tablet Take 40 mg by mouth at bedtime.        No current facility-administered medications on file prior to visit.    BP 120/70  Pulse 94  Temp(Src) 97.7 F (36.5 C) (Oral)  Resp 20  Wt 159 lb (72.122 kg)  SpO2 96%       Review of Systems  Constitutional: Positive for fatigue.  HENT: Negative for congestion, dental problem, hearing loss, rhinorrhea, sinus pressure, sore throat and tinnitus.   Eyes: Negative for pain, discharge and visual disturbance.  Respiratory: Negative for cough and shortness of breath.   Cardiovascular: Negative for chest pain, palpitations and leg swelling.  Gastrointestinal: Negative for nausea, vomiting, abdominal pain, diarrhea, constipation, blood in stool and abdominal distention.  Genitourinary: Negative for dysuria, urgency, frequency, hematuria, flank pain, vaginal bleeding, vaginal discharge, difficulty urinating, vaginal pain and pelvic pain.  Musculoskeletal: Positive for arthralgias, back pain and gait problem. Negative for joint swelling.  Skin: Negative for rash.  Neurological: Positive for weakness and numbness. Negative for dizziness, syncope, speech difficulty and headaches.  Hematological: Negative for adenopathy.  Psychiatric/Behavioral: Negative for behavioral problems, dysphoric mood and agitation. The patient is not nervous/anxious.        Objective:   Physical Exam  Constitutional: She is oriented to person, place, and time. She appears well-developed and well-nourished.  HENT:  Head: Normocephalic.  Right Ear: External ear normal.  Left Ear: External ear normal.  Mouth/Throat: Oropharynx is clear and  moist.  Eyes: Conjunctivae and EOM are normal. Pupils are equal, round, and reactive to light.  Neck: Normal range of motion. Neck supple. No thyromegaly present.  Cardiovascular: Normal rate, regular rhythm, normal heart sounds and intact distal pulses.   Pulmonary/Chest: Effort normal and breath sounds normal.  Abdominal: Soft. Bowel sounds are normal. She exhibits no mass. There is no tenderness.  Musculoskeletal: Normal range of motion. She exhibits no edema.  Lymphadenopathy:    She has no cervical adenopathy.  Neurological: She is alert and oriented to person, place, and time.  Skin: Skin is warm and dry. No rash noted.  Psychiatric: She has a normal mood and affect. Her behavior is normal.          Assessment & Plan:   HTN-stable CAD- stable DJD/RA- remains symptomatic; trial tramadol to moderate use of hydrocodone; f/u Rheumatology Gait instability;  Continue exercise, effort at weight loss and use of walker

## 2013-11-06 ENCOUNTER — Other Ambulatory Visit: Payer: Self-pay | Admitting: Internal Medicine

## 2013-12-29 ENCOUNTER — Telehealth: Payer: Self-pay | Admitting: Internal Medicine

## 2013-12-29 NOTE — Telephone Encounter (Signed)
Please advise 

## 2013-12-29 NOTE — Telephone Encounter (Signed)
Patient Information:  Caller Name: [name]  Phone: [phone]  Patient: [name], [name]  Gender: Female  DOB: June 18, 1932  Age: 80 Years  PCP: Eleonore Chiquito (Family Practice > 22yrs old)  Office Follow Up:  Does the office need to follow up with this patient?: Yes  Instructions For The Office: Patient takes blood pressure medicines/diuretics but thinks Tramadol may be causing dizziness. Dizziness is intermittent but thinks is due to Tramadol. Merlinda Frederick like MD to write a new script for Hydrocodone as it does not cause dizziness. At last visit, MD advised using Tramadol at times instead of Hydrocodone. Please call patient and advise if MD thinks may be a different medicine causing and if she can get a new script for Hydrocodone. Uses for rheumatoid arthritis pain.  RN Note:  Dizziness is intermittent 2-5. Only feels dizzy when bending over.  Symptoms  Reason For Call & Symptoms: Has had dizziness since "about 2 weeks ago" and thinks began after taking Tramadol. States dizziness became more severe 2-4.  Has not taken medicine 2-5 but dizziness continues.  Reviewed Health History In EMR: Yes  Reviewed Medications In EMR: Yes  Reviewed Allergies In EMR: Yes  Reviewed Surgeries / Procedures: Yes  Date of Onset of Symptoms: 12/28/2013  Guideline(s) Used:  Dizziness  Disposition Per Guideline:   Discuss with PCP and Callback by Nurse Today  Reason For Disposition Reached:   Taking a medicine that could cause dizziness (e.g., blood pressure medications, diuretics)  Advice Given:  N/A  Patient Will Follow Care Advice:  YES

## 2013-12-29 NOTE — Telephone Encounter (Signed)
Okay to discontinue tramadol and resume Vicodin 5-325 #60

## 2013-12-30 MED ORDER — HYDROCODONE-ACETAMINOPHEN 5-325 MG PO TABS: 1.0000 | ORAL_TABLET | Freq: Four times a day (QID) | ORAL | Status: DC | PRN

## 2013-12-30 NOTE — Telephone Encounter (Signed)
Left message on voicemail to call office.  

## 2013-12-30 NOTE — Telephone Encounter (Signed)
Spoke to pt told her to stop Tramadol and resume Hydrocodone, Rx here at office ready for pickup at the front desk. Pt verbalized understanding. Rx printed and signed, put at front desk.

## 2014-01-25 ENCOUNTER — Telehealth: Payer: Self-pay | Admitting: Internal Medicine

## 2014-01-25 NOTE — Telephone Encounter (Signed)
Pt is needing rx HYDROcodone-acetaminophen (NORCO/VICODIN) 5-325 MG per tablet, please call when available for pick up.

## 2014-01-26 MED ORDER — HYDROCODONE-ACETAMINOPHEN 5-325 MG PO TABS: 1.0000 | ORAL_TABLET | Freq: Four times a day (QID) | ORAL | Status: DC | PRN

## 2014-01-26 NOTE — Telephone Encounter (Signed)
Left detailed message Rx ready for pickup, will be at the front desk. Rx printed and signed. 

## 2014-01-27 ENCOUNTER — Other Ambulatory Visit: Payer: Self-pay | Admitting: Internal Medicine

## 2014-02-20 ENCOUNTER — Other Ambulatory Visit: Payer: Self-pay | Admitting: Internal Medicine

## 2014-02-28 ENCOUNTER — Telehealth: Payer: Self-pay | Admitting: Internal Medicine

## 2014-02-28 MED ORDER — HYDROCODONE-ACETAMINOPHEN 5-325 MG PO TABS: 1.0000 | ORAL_TABLET | Freq: Four times a day (QID) | ORAL | Status: DC | PRN

## 2014-02-28 NOTE — Telephone Encounter (Signed)
Pt request rx HYDROcodone-acetaminophen (NORCO/VICODIN) 5-325 MG per tablet ° °

## 2014-02-28 NOTE — Telephone Encounter (Signed)
Pt notified Rx ready for pickup, will be at the front desk. Rx printed and signed. 

## 2014-03-13 ENCOUNTER — Encounter (HOSPITAL_COMMUNITY): Payer: Self-pay | Admitting: Emergency Medicine

## 2014-03-13 ENCOUNTER — Emergency Department (HOSPITAL_COMMUNITY): Payer: Medicare Other

## 2014-03-13 ENCOUNTER — Emergency Department (HOSPITAL_COMMUNITY)
Admission: EM | Admit: 2014-03-13 | Discharge: 2014-03-13 | Disposition: A | Discharge status: Home / Self Care | Payer: Medicare Other | Attending: Emergency Medicine | Admitting: Emergency Medicine

## 2014-03-13 DIAGNOSIS — IMO0002 Reserved for concepts with insufficient information to code with codable children: Secondary | ICD-10-CM

## 2014-03-13 DIAGNOSIS — Z8701 Personal history of pneumonia (recurrent): Secondary | ICD-10-CM | POA: Insufficient documentation

## 2014-03-13 DIAGNOSIS — D649 Anemia, unspecified: Secondary | ICD-10-CM | POA: Insufficient documentation

## 2014-03-13 DIAGNOSIS — W19XXXA Unspecified fall, initial encounter: Secondary | ICD-10-CM

## 2014-03-13 DIAGNOSIS — E782 Mixed hyperlipidemia: Secondary | ICD-10-CM | POA: Insufficient documentation

## 2014-03-13 DIAGNOSIS — G8929 Other chronic pain: Secondary | ICD-10-CM | POA: Insufficient documentation

## 2014-03-13 DIAGNOSIS — I2510 Atherosclerotic heart disease of native coronary artery without angina pectoris: Secondary | ICD-10-CM | POA: Insufficient documentation

## 2014-03-13 DIAGNOSIS — Z79899 Other long term (current) drug therapy: Secondary | ICD-10-CM | POA: Insufficient documentation

## 2014-03-13 DIAGNOSIS — S0003XA Contusion of scalp, initial encounter: Secondary | ICD-10-CM | POA: Insufficient documentation

## 2014-03-13 DIAGNOSIS — S1093XA Contusion of unspecified part of neck, initial encounter: Principal | ICD-10-CM

## 2014-03-13 DIAGNOSIS — T148XXA Other injury of unspecified body region, initial encounter: Secondary | ICD-10-CM

## 2014-03-13 DIAGNOSIS — K219 Gastro-esophageal reflux disease without esophagitis: Secondary | ICD-10-CM | POA: Insufficient documentation

## 2014-03-13 DIAGNOSIS — M899 Disorder of bone, unspecified: Secondary | ICD-10-CM | POA: Insufficient documentation

## 2014-03-13 DIAGNOSIS — M069 Rheumatoid arthritis, unspecified: Secondary | ICD-10-CM | POA: Insufficient documentation

## 2014-03-13 DIAGNOSIS — Z860100 Personal history of colon polyps, unspecified: Secondary | ICD-10-CM | POA: Insufficient documentation

## 2014-03-13 DIAGNOSIS — S0100XA Unspecified open wound of scalp, initial encounter: Secondary | ICD-10-CM | POA: Insufficient documentation

## 2014-03-13 DIAGNOSIS — F329 Major depressive disorder, single episode, unspecified: Secondary | ICD-10-CM | POA: Insufficient documentation

## 2014-03-13 DIAGNOSIS — Y93G3 Activity, cooking and baking: Secondary | ICD-10-CM | POA: Insufficient documentation

## 2014-03-13 DIAGNOSIS — Z951 Presence of aortocoronary bypass graft: Secondary | ICD-10-CM | POA: Insufficient documentation

## 2014-03-13 DIAGNOSIS — Z8742 Personal history of other diseases of the female genital tract: Secondary | ICD-10-CM | POA: Insufficient documentation

## 2014-03-13 DIAGNOSIS — Z880 Allergy status to penicillin: Secondary | ICD-10-CM | POA: Insufficient documentation

## 2014-03-13 DIAGNOSIS — F3289 Other specified depressive episodes: Secondary | ICD-10-CM | POA: Insufficient documentation

## 2014-03-13 DIAGNOSIS — W268XXA Contact with other sharp object(s), not elsewhere classified, initial encounter: Secondary | ICD-10-CM | POA: Insufficient documentation

## 2014-03-13 DIAGNOSIS — J45909 Unspecified asthma, uncomplicated: Secondary | ICD-10-CM | POA: Insufficient documentation

## 2014-03-13 DIAGNOSIS — Z8639 Personal history of other endocrine, nutritional and metabolic disease: Secondary | ICD-10-CM | POA: Insufficient documentation

## 2014-03-13 DIAGNOSIS — Z8619 Personal history of other infectious and parasitic diseases: Secondary | ICD-10-CM | POA: Insufficient documentation

## 2014-03-13 DIAGNOSIS — M1990 Unspecified osteoarthritis, unspecified site: Secondary | ICD-10-CM | POA: Insufficient documentation

## 2014-03-13 DIAGNOSIS — I10 Essential (primary) hypertension: Secondary | ICD-10-CM | POA: Insufficient documentation

## 2014-03-13 DIAGNOSIS — S0083XA Contusion of other part of head, initial encounter: Principal | ICD-10-CM | POA: Insufficient documentation

## 2014-03-13 DIAGNOSIS — Z872 Personal history of diseases of the skin and subcutaneous tissue: Secondary | ICD-10-CM | POA: Insufficient documentation

## 2014-03-13 DIAGNOSIS — E785 Hyperlipidemia, unspecified: Secondary | ICD-10-CM | POA: Insufficient documentation

## 2014-03-13 DIAGNOSIS — R609 Edema, unspecified: Secondary | ICD-10-CM | POA: Insufficient documentation

## 2014-03-13 DIAGNOSIS — Z7982 Long term (current) use of aspirin: Secondary | ICD-10-CM | POA: Insufficient documentation

## 2014-03-13 DIAGNOSIS — Y9289 Other specified places as the place of occurrence of the external cause: Secondary | ICD-10-CM | POA: Insufficient documentation

## 2014-03-13 DIAGNOSIS — M949 Disorder of cartilage, unspecified: Secondary | ICD-10-CM

## 2014-03-13 DIAGNOSIS — Z9889 Other specified postprocedural states: Secondary | ICD-10-CM | POA: Insufficient documentation

## 2014-03-13 DIAGNOSIS — W01119A Fall on same level from slipping, tripping and stumbling with subsequent striking against unspecified sharp object, initial encounter: Secondary | ICD-10-CM | POA: Insufficient documentation

## 2014-03-13 NOTE — Discharge Instructions (Signed)
We saw you in the ER after you had a fall. All the imaging results are normal, no fractures seen. No evidence of brain bleed. Please be very careful with walking, and do everything possible to prevent falls.   Contusion A contusion is a deep bruise. Contusions are the result of an injury that caused bleeding under the skin. The contusion may turn blue, purple, or yellow. Minor injuries will give you a painless contusion, but more severe contusions may stay painful and swollen for a few weeks.  CAUSES  A contusion is usually caused by a blow, trauma, or direct force to an area of the body. SYMPTOMS   Swelling and redness of the injured area.  Bruising of the injured area.  Tenderness and soreness of the injured area.  Pain. DIAGNOSIS  The diagnosis can be made by taking a history and physical exam. An X-ray, CT scan, or MRI may be needed to determine if there were any associated injuries, such as fractures. TREATMENT  Specific treatment will depend on what area of the body was injured. In general, the best treatment for a contusion is resting, icing, elevating, and applying cold compresses to the injured area. Over-the-counter medicines may also be recommended for pain control. Ask your caregiver what the best treatment is for your contusion. HOME CARE INSTRUCTIONS   Put ice on the injured area.  Put ice in a plastic bag.  Place a towel between your skin and the bag.  Leave the ice on for 15-20 minutes, 03-04 times a day.  Only take over-the-counter or prescription medicines for pain, discomfort, or fever as directed by your caregiver. Your caregiver may recommend avoiding anti-inflammatory medicines (aspirin, ibuprofen, and naproxen) for 48 hours because these medicines may increase bruising.  Rest the injured area.  If possible, elevate the injured area to reduce swelling. SEEK IMMEDIATE MEDICAL CARE IF:   You have increased bruising or swelling.  You have pain that is  getting worse.  Your swelling or pain is not relieved with medicines. MAKE SURE YOU:   Understand these instructions.  Will watch your condition.  Will get help right away if you are not doing well or get worse. Document Released: 08/20/2005 Document Revised: 02/02/2012 Document Reviewed: 09/15/2011 [facility] Patient Information 2014 [place], [place].  Hematoma A hematoma is a collection of blood under the skin, in an organ, in a body space, in a joint space, or in other tissue. The blood can clot to form a lump that you can see and feel. The lump is often firm and may sometimes become sore and tender. Most hematomas get better in a few days to weeks. However, some hematomas may be serious and require medical care. Hematomas can range in size from very small to very large. CAUSES  A hematoma can be caused by a blunt or penetrating injury. It can also be caused by spontaneous leakage from a blood vessel under the skin. Spontaneous leakage from a blood vessel is more likely to occur in older people, especially those taking blood thinners. Sometimes, a hematoma can develop after certain medical procedures. SIGNS AND SYMPTOMS   A firm lump on the body.  Possible pain and tenderness in the area.  Bruising.Blue, dark blue, purple-red, or yellowish skin may appear at the site of the hematoma if the hematoma is close to the surface of the skin. For hematomas in deeper tissues or body spaces, the signs and symptoms may be subtle. For example, an intra-abdominal hematoma may cause abdominal  pain, weakness, fainting, and shortness of breath. An intracranial hematoma may cause a headache or symptoms such as weakness, trouble speaking, or a change in consciousness. DIAGNOSIS  A hematoma can usually be diagnosed based on your medical history and a physical exam. Imaging tests may be needed if your health care provider suspects a hematoma in deeper tissues or body spaces, such as the abdomen, head, or  chest. These tests may include ultrasonography or a CT scan.  TREATMENT  Hematomas usually go away on their own over time. Rarely does the blood need to be drained out of the body. Large hematomas or those that may affect vital organs will sometimes need surgical drainage or monitoring. HOME CARE INSTRUCTIONS   Apply ice to the injured area:   Put ice in a plastic bag.   Place a towel between your skin and the bag.   Leave the ice on for 20 minutes, 2 3 times a day for the first 1 to 2 days.   After the first 2 days, switch to using warm compresses on the hematoma.   Elevate the injured area to help decrease pain and swelling. Wrapping the area with an elastic bandage may also be helpful. Compression helps to reduce swelling and promotes shrinking of the hematoma. Make sure the bandage is not wrapped too tight.   If your hematoma is on a lower extremity and is painful, crutches may be helpful for a couple days.   Only take over-the-counter or prescription medicines as directed by your health care provider. SEEK IMMEDIATE MEDICAL CARE IF:   You have increasing pain, or your pain is not controlled with medicine.   You have a fever.   You have worsening swelling or discoloration.   Your skin over the hematoma breaks or starts bleeding.   Your hematoma is in your chest or abdomen and you have weakness, shortness of breath, or a change in consciousness.  Your hematoma is on your scalp (caused by a fall or injury) and you have a worsening headache or a change in alertness or consciousness. MAKE SURE YOU:   Understand these instructions.  Will watch your condition.  Will get help right away if you are not doing well or get worse. Document Released: 06/24/2004 Document Revised: 07/13/2013 Document Reviewed: 04/20/2013 [facility] Patient Information 2014 [place], [place].

## 2014-03-13 NOTE — ED Notes (Signed)
Patient back from CT in NAD.

## 2014-03-13 NOTE — ED Notes (Signed)
Dr. Rhunette Croft made aware of need to close lac to back of head

## 2014-03-13 NOTE — ED Notes (Addendum)
Patient arrives via POV due to fall--typically uses rolling walker No LOC Bruising noted to left side of face/eye Patient appears in NAD

## 2014-03-13 NOTE — ED Provider Notes (Signed)
CSN: [id number]     Arrival date & time 03/13/14  0113 History   First MD Initiated Contact with Patient 03/13/14 0123     Chief Complaint  Patient presents with  . Fall     (Consider location/radiation/quality/duration/timing/severity/associated sxs/prior Treatment) HPI Comments: 80 y/o Caucasion female presents after a fall that occurred earlier this evening around 11pm.  States she was in the kitchen cooking, turned around to do something and lost her balance.  She fell hittng her head on a open drawer and the stove. Normally uses a walker but was not using it tonight.  Complains of mild pain in the left side of her head and pain in the back of head.  Bleeding from back of head from a 2-3cm laceration. Denies LOC.  Admits to losing her balance and falling on a daily basis.  Last big fall causing an ED visit was in Jan.  Denies pain anywhere else, HAs, visual changes, recent illness, chest pain, SOB, lightheadness, and dizziness.  Patient is a 80 y.o. female presenting with fall. The history is provided by the patient.  Fall Associated symptoms include headaches. Pertinent negatives include no chest pain, no abdominal pain and no shortness of breath.    Past Medical History  Diagnosis Date  . DEPRESSION 05/21/2007  . FIBROCYSTIC BREAST DISEASE, HX OF 05/21/2007  . HYPERLIPIDEMIA-MIXED 11/26/2008    takes Simvastatin daily  . Irritable bowel syndrome 05/21/2007  . KNEE PAIN 09/16/2007  . LEG EDEMA, BILATERAL 05/21/2009  . OSTEOPENIA 05/21/2007  . URTICARIA, CHRONIC 05/21/2007  . History of aortic aneurysm     resection and grafting of a 5.2-cm ascending aortic arch aneurysm August 2009  . Dyslipidemia   . Vitamin D deficiency   . Urticaria   . Neuropathy     left radial  neuropathy with wrist drop  . Diverticulosis   . Irritable bowel syndrome   . GERD (gastroesophageal reflux disease)   . 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 Vaginal yeast infection     recently told to  use monistat, hasn't started as of 01/26/2012  . HYPERTENSION 05/11/2008    takes Metoprolol and HCTZ daily  . Asthma     hx of;as a child  . Seasonal allergies     takes Hydroxyzine prn and Zyrtec 2 times a week  . Peripheral edema     takes Furosemide prn  . Pneumonia     history of;in the 90's  . Weakness     hands and feet  . Sciatica   . OSTEOARTHRITIS 01/20/2008    takes Methotrexate weekly  . DJD (degenerative joint disease)     history of   . Rheumatoid arthritis(714.0)   . Chronic back pain     takes Norco prn  . Bruises easily     takes Plaquenil daily  . CORONARY ATHEROSCLEROSIS, ARTERY BYPASS GRAFT 08/23/2008  . CAD (coronary artery disease)     s/p CABG -  x 1 with LIMA to LAD August 2009 /   . Urinary incontinence   . Bowel incontinence   . History of colon polyps   . Urinary frequency   . Urinary urgency   . Nocturia   . Anemia     takes folic acid bid  . Urinary incontinence     wears depends   Past Surgical History  Procedure Laterality Date  . Appendectomy    . Cesarean section  1968  x 1  . Hernia repair    . Coronary artery bypass graft  August 2009    CABG x 1 with LIMA to LAD /  . Ascending aortic aneurysm repair  August 2009     resection and grafting of a 5.2-cm ascending aortic arch aneurysm  . Joint replacement      2009 & 2010- both knees  . Back surgery      2011- cerv. fusion   . Breast surgery      biopsy x2- R breast  . Tonsillectomy      as a child  . Cardiac catheterization  2009  . Colonoscopy    . Lumbar laminectomy/decompression microdiscectomy Bilateral 07/11/2013    Procedure: LUMBAR LAMINECTOMY/DECOMPRESSION MICRODISCECTOMY LUMBAR FIVE SACRAL ONE;  Surgeon: Mariam Dollar, MD;  Location: [facility];  Service: Neurosurgery;  Laterality: Bilateral;   Family History  Problem Relation Age of Onset  . Heart disease    . Coronary artery disease    . Anesthesia problems Neg Hx   . Hypotension Neg Hx   . Malignant  hyperthermia Neg Hx   . Pseudochol deficiency Neg Hx   . Heart disease Mother   . Stroke Father   . Cancer Maternal Grandmother   . Stroke Paternal Grandmother    History  Substance Use Topics  . Smoking status: Never Smoker   . Smokeless tobacco: Not on file  . Alcohol Use: No   OB History   Grav Para Term Preterm Abortions TAB SAB Ect Mult Living                 Review of Systems  Constitutional: Negative for activity change.  Respiratory: Negative for shortness of breath.   Cardiovascular: Negative for chest pain.  Gastrointestinal: Negative for nausea, vomiting and abdominal pain.  Genitourinary: Negative for dysuria.  Musculoskeletal: Negative for neck pain.  Neurological: Positive for headaches.  Hematological: Does not bruise/bleed easily.      Allergies  Amoxicillin; Cyclobenzaprine hcl; Mupirocin; Penicillins; and Tetanus toxoid  Home Medications   Prior to Admission medications   Medication Sig Start Date End Date Taking? Authorizing Provider  acetaminophen (TYLENOL) 500 MG tablet Take 500 mg by mouth every 6 (six) hours as needed (for pain.).   Yes Historical Provider, MD  aspirin EC 81 MG tablet Take 81 mg by mouth daily.   Yes Historical Provider, MD  calcium carbonate (TUMS - DOSED IN MG ELEMENTAL CALCIUM) 500 MG chewable tablet Chew 2 tablets by mouth 2 (two) times daily.    Yes Historical Provider, MD  cetirizine (ZYRTEC) 10 MG tablet Take 10 mg by mouth daily as needed. Alternating with Hydroxyzine  For allergies   Yes Historical Provider, MD  folic acid (FOLVITE) 1 MG tablet Take 1 mg by mouth 2 (two) times daily.   Yes Historical Provider, MD  furosemide (LASIX) 40 MG tablet Take 20-40 mg by mouth as needed. As needed for sweling   Yes Historical Provider, MD  hydrochlorothiazide (HYDRODIURIL) 25 MG tablet Take 25 mg by mouth daily.   Yes Historical Provider, MD  HYDROcodone-acetaminophen (NORCO/VICODIN) 5-325 MG per tablet Take 1 tablet by mouth every  6 (six) hours as needed for moderate pain. 02/28/14  Yes Gordy Savers, MD  hydroxychloroquine (PLAQUENIL) 200 MG tablet Take 200 mg by mouth 2 (two) times daily.    Yes Historical Provider, MD  hydrOXYzine (ATARAX/VISTARIL) 25 MG tablet Take 25 mg by mouth every 4 (four) hours as needed. For  allergies alternate with Zyrtec   Yes Historical Provider, MD  methotrexate (RHEUMATREX) 2.5 MG tablet Take 15 mg by mouth once a week. Caution:Chemotherapy. Protect from light.   Yes Historical Provider, MD  metoprolol succinate (TOPROL-XL) 50 MG 24 hr tablet TAKE 1 TABLET EVERY DAY 02/20/14  Yes Gordy Savers, MD  potassium chloride SA (KLOR-CON M20) 20 MEQ tablet Take 10-20 mEq by mouth as needed. As needed with Furosemide for fluid retention 09/03/11  Yes Gordy Savers, MD  simvastatin (ZOCOR) 40 MG tablet Take 20 mg by mouth at bedtime.  09/30/11  Yes Gordy Savers, MD   BP 149/54  Pulse 83  Temp(Src) 98.2 F (36.8 C) (Oral)  Resp 19  Ht 5\' 3"  (1.6 m)  Wt 160 lb (72.576 kg)  BMI 28.35 kg/m2  SpO2 94% Physical Exam  Nursing note and vitals reviewed. Constitutional: She is oriented to person, place, and time. She appears well-developed and well-nourished.  HENT:  Head: Normocephalic and atraumatic.  3 cm posterior scalp laceration. Ecchymoses of the eye, bilaterally  Eyes: EOM are normal. Pupils are equal, round, and reactive to light.  Neck: Neck supple.  Cardiovascular: Normal rate, regular rhythm and normal heart sounds.   No murmur heard. Pulmonary/Chest: Effort normal. No respiratory distress.  Abdominal: Soft. She exhibits no distension. There is no tenderness. There is no rebound and no guarding.  Musculoskeletal:  Head to toe evaluation shows no hematoma, bleeding of the scalp, no facial abrasions, step offs, crepitus, no tenderness to palpation of the bilateral upper and lower extremities, no gross deformities, no chest tenderness, no pelvic pain.   Neurological:  She is alert and oriented to person, place, and time.  Skin: Skin is warm and dry.    ED Course  Wound closure utilizing adhes only Date/Time: 03/13/2014 8:31 AM Performed by: Derwood Kaplan Authorized by: Derwood Kaplan Consent: Verbal consent obtained. Risks and benefits: risks, benefits and alternatives were discussed Consent given by: patient Required items: required blood products, implants, devices, and special equipment available Patient identity confirmed: verbally with patient Local anesthesia used: no Patient sedated: no Patient tolerance: Patient tolerated the procedure well with no immediate complications.   (including critical care time) Labs Review Labs Reviewed - No data to display  Imaging Review Ct Head Wo Contrast  03/13/2014   CLINICAL DATA:  Fall  EXAM: CT HEAD WITHOUT CONTRAST  CT MAXILLOFACIAL WITHOUT CONTRAST  TECHNIQUE: Multidetector CT imaging of the head and maxillofacial structures were performed using the standard protocol without intravenous contrast. Multiplanar CT image reconstructions of the maxillofacial structures were also generated.  COMPARISON:  Prior CT from 02/12/2012.  FINDINGS: CT HEAD FINDINGS  There is no acute intracranial hemorrhage or infarct. No mass lesion or midline shift. Gray-white matter differentiation is well maintained. Ventricles are normal in size without evidence of hydrocephalus. CSF containing spaces are within normal limits. No extra-axial fluid collection. Scattered and confluent hypodensity within the periventricular and deep white matter both cerebral hemispheres is most compatible with mild chronic small vessel ischemic changes.  The calvarium is intact.  Orbital soft tissues are within normal limits.  The paranasal sinuses and mastoid air cells are well pneumatized and free of fluid.  Small left temple scalp contusion present. Swelling with laceration present within the left parietal scalp. Focal soft tissue swelling with  hematoma also seen within the right frontal scalp.  CT MAXILLOFACIAL FINDINGS  The globes are intact. The bony orbits are intact without evidence of orbital floor  fracture. No retro-orbital hematoma or other pathology.  Zygomatic arches are intact. No mandibular fracture. The maxilla is intact. Pterygoid plates are intact. Mild irregularity about the nasal bones bilaterally appears chronic in nature. No acute nasal bone fracture. Rightward bowing of the nasal septum noted.  Paranasal sinuses are clear.  No mastoid effusion.  Small right frontal and left temple scalp contusion is present.  ACDF partially visualized within the cervical spine. The upper or cervical spine appears grossly intact.  IMPRESSION: CT BRAIN:  1. No acute intracranial process. 2. Small right frontal, left temporal, and left posterior scalp contusion is. The left posterior scalp contusion appears to be associated with a small laceration. 3. Mild chronic small vessel ischemic disease.  CT MAXILLOFACIAL  1. No acute maxillofacial injury identified.   Electronically Signed   By: Rise Mu M.D.   On: 03/13/2014 04:07   Ct Maxillofacial Wo Cm  03/13/2014   CLINICAL DATA:  Fall  EXAM: CT HEAD WITHOUT CONTRAST  CT MAXILLOFACIAL WITHOUT CONTRAST  TECHNIQUE: Multidetector CT imaging of the head and maxillofacial structures were performed using the standard protocol without intravenous contrast. Multiplanar CT image reconstructions of the maxillofacial structures were also generated.  COMPARISON:  Prior CT from 02/12/2012.  FINDINGS: CT HEAD FINDINGS  There is no acute intracranial hemorrhage or infarct. No mass lesion or midline shift. Gray-white matter differentiation is well maintained. Ventricles are normal in size without evidence of hydrocephalus. CSF containing spaces are within normal limits. No extra-axial fluid collection. Scattered and confluent hypodensity within the periventricular and deep white matter both cerebral hemispheres  is most compatible with mild chronic small vessel ischemic changes.  The calvarium is intact.  Orbital soft tissues are within normal limits.  The paranasal sinuses and mastoid air cells are well pneumatized and free of fluid.  Small left temple scalp contusion present. Swelling with laceration present within the left parietal scalp. Focal soft tissue swelling with hematoma also seen within the right frontal scalp.  CT MAXILLOFACIAL FINDINGS  The globes are intact. The bony orbits are intact without evidence of orbital floor fracture. No retro-orbital hematoma or other pathology.  Zygomatic arches are intact. No mandibular fracture. The maxilla is intact. Pterygoid plates are intact. Mild irregularity about the nasal bones bilaterally appears chronic in nature. No acute nasal bone fracture. Rightward bowing of the nasal septum noted.  Paranasal sinuses are clear.  No mastoid effusion.  Small right frontal and left temple scalp contusion is present.  ACDF partially visualized within the cervical spine. The upper or cervical spine appears grossly intact.  IMPRESSION: CT BRAIN:  1. No acute intracranial process. 2. Small right frontal, left temporal, and left posterior scalp contusion is. The left posterior scalp contusion appears to be associated with a small laceration. 3. Mild chronic small vessel ischemic disease.  CT MAXILLOFACIAL  1. No acute maxillofacial injury identified.   Electronically Signed   By: Rise Mu M.D.   On: 03/13/2014 04:07     EKG Interpretation None      MDM   Final diagnoses:  Fall  Contusion  Laceration  Hematoma    Pt with fall. Small scalp laceration - pt wanted to avoid stitches, staples - so we placed a dermabond. Will d/c.  Derwood Kaplan, MD 03/13/14 [phone]

## 2014-03-27 ENCOUNTER — Telehealth: Payer: Self-pay | Admitting: Internal Medicine

## 2014-03-27 NOTE — Telephone Encounter (Signed)
Pt is needing new rx for HYDROcodone-acetaminophen (NORCO/VICODIN) 5-325 MG per tablet, please call when available for pick up. ° °

## 2014-03-28 MED ORDER — HYDROCODONE-ACETAMINOPHEN 5-325 MG PO TABS: 1.0000 | ORAL_TABLET | Freq: Four times a day (QID) | ORAL | Status: DC | PRN

## 2014-03-28 NOTE — Telephone Encounter (Signed)
Left detailed message Rx ready for pickup. Rx printed and signed. 

## 2014-04-06 ENCOUNTER — Other Ambulatory Visit: Payer: Self-pay | Admitting: Internal Medicine

## 2014-04-21 ENCOUNTER — Telehealth: Payer: Self-pay | Admitting: Internal Medicine

## 2014-04-21 MED ORDER — HYDROCODONE-ACETAMINOPHEN 5-325 MG PO TABS: 1.0000 | ORAL_TABLET | Freq: Four times a day (QID) | ORAL | Status: DC | PRN

## 2014-04-21 NOTE — Telephone Encounter (Signed)
Left detailed message Rx ready for pickup. Rx printed and signed. 

## 2014-04-21 NOTE — Telephone Encounter (Signed)
Pt needs new rx hydrocodone °

## 2014-05-03 ENCOUNTER — Telehealth: Payer: Self-pay | Admitting: Internal Medicine

## 2014-05-03 ENCOUNTER — Encounter: Payer: Self-pay | Admitting: Internal Medicine

## 2014-05-03 ENCOUNTER — Ambulatory Visit (INDEPENDENT_AMBULATORY_CARE_PROVIDER_SITE_OTHER): Payer: Medicare Other | Admitting: Internal Medicine

## 2014-05-03 VITALS — BP 130/60 | HR 90 | Temp 97.7°F | Resp 20 | Ht 63.0 in | Wt 163.0 lb

## 2014-05-03 DIAGNOSIS — I10 Essential (primary) hypertension: Secondary | ICD-10-CM

## 2014-05-03 DIAGNOSIS — R269 Unspecified abnormalities of gait and mobility: Secondary | ICD-10-CM

## 2014-05-03 DIAGNOSIS — L508 Other urticaria: Secondary | ICD-10-CM

## 2014-05-03 DIAGNOSIS — M1990 Unspecified osteoarthritis, unspecified site: Secondary | ICD-10-CM

## 2014-05-03 DIAGNOSIS — I25810 Atherosclerosis of coronary artery bypass graft(s) without angina pectoris: Secondary | ICD-10-CM

## 2014-05-03 DIAGNOSIS — R2681 Unsteadiness on feet: Secondary | ICD-10-CM

## 2014-05-03 NOTE — Progress Notes (Signed)
Pre-visit discussion using our clinic review tool. No additional management support is needed unless otherwise documented below in the visit note.  

## 2014-05-03 NOTE — Patient Instructions (Signed)
Limit your sodium (Salt) intake    It is important that you exercise regularly, at least 20 minutes 3 to 4 times per week.  If you develop chest pain or shortness of breath seek  medical attention.  Continue physical therapy program  Return in 6 months for follow-up

## 2014-05-03 NOTE — Progress Notes (Signed)
Subjective:    Patient ID: [name], female    DOB: [birth year], 80 y.o.   MRN: [id number]  HPI  80 year old patient who is seen today for her biannual followup.  She is followed by rheumatology as well as orthopedic surgery.  She has coronary artery disease and is status post CABG.  She has hypertension and dyslipidemia.  She has not arthritis and significant gait disturbance.  This is her chief problem and has resulted in multiple falls.  She continues to receive home physical therapy with some marginal benefit.  Denies any cardiopulmonary complaints.  Past Medical History  Diagnosis Date  . DEPRESSION 05/21/2007  . FIBROCYSTIC BREAST DISEASE, HX OF 05/21/2007  . HYPERLIPIDEMIA-MIXED 11/26/2008    takes Simvastatin daily  . Irritable bowel syndrome 05/21/2007  . KNEE PAIN 09/16/2007  . LEG EDEMA, BILATERAL 05/21/2009  . OSTEOPENIA 05/21/2007  . URTICARIA, CHRONIC 05/21/2007  . History of aortic aneurysm     resection and grafting of a 5.2-cm ascending aortic arch aneurysm August 2009  . Dyslipidemia   . Vitamin D deficiency   . Urticaria   . Neuropathy     left radial  neuropathy with wrist drop  . Diverticulosis   . Irritable bowel syndrome   . GERD (gastroesophageal reflux disease)   . 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 Vaginal yeast infection     recently told to use monistat, hasn't started as of 01/26/2012  . HYPERTENSION 05/11/2008    takes Metoprolol and HCTZ daily  . Asthma     hx of;as a child  . Seasonal allergies     takes Hydroxyzine prn and Zyrtec 2 times a week  . Peripheral edema     takes Furosemide prn  . Pneumonia     history of;in the 90's  . Weakness     hands and feet  . Sciatica   . OSTEOARTHRITIS 01/20/2008    takes Methotrexate weekly  . DJD (degenerative joint disease)     history of   . Rheumatoid arthritis(714.0)   . Chronic back pain     takes Norco prn  . Bruises easily     takes Plaquenil daily  . CORONARY  ATHEROSCLEROSIS, ARTERY BYPASS GRAFT 08/23/2008  . CAD (coronary artery disease)     s/p CABG -  x 1 with LIMA to LAD August 2009 /   . Urinary incontinence   . Bowel incontinence   . History of colon polyps   . Urinary frequency   . Urinary urgency   . Nocturia   . Anemia     takes folic acid bid  . Urinary incontinence     wears depends    History   Social History  . Marital Status: Widowed    Spouse Name: N/A    Number of Children: N/A  . Years of Education: N/A   Occupational History  . Not on file.   Social History Main Topics  . Smoking status: Never Smoker   . Smokeless tobacco: Not on file  . Alcohol Use: No  . Drug Use: No  . Sexual Activity: No   Other Topics Concern  . Not on file   Social History Narrative  . No narrative on file    Past Surgical History  Procedure Laterality Date  . Appendectomy    . Cesarean section  1968    x 1  . Hernia repair    . Coronary  artery bypass graft  August 2009    CABG x 1 with LIMA to LAD /  . Ascending aortic aneurysm repair  August 2009     resection and grafting of a 5.2-cm ascending aortic arch aneurysm  . Joint replacement      2009 & 2010- both knees  . Back surgery      2011- cerv. fusion   . Breast surgery      biopsy x2- R breast  . Tonsillectomy      as a child  . Cardiac catheterization  2009  . Colonoscopy    . Lumbar laminectomy/decompression microdiscectomy Bilateral 07/11/2013    Procedure: LUMBAR LAMINECTOMY/DECOMPRESSION MICRODISCECTOMY LUMBAR FIVE SACRAL ONE;  Surgeon: Mariam DollarGary P Cram, MD;  Location: [facility];  Service: Neurosurgery;  Laterality: Bilateral;    Family History  Problem Relation Age of Onset  . Heart disease    . Coronary artery disease    . Anesthesia problems Neg Hx   . Hypotension Neg Hx   . Malignant hyperthermia Neg Hx   . Pseudochol deficiency Neg Hx   . Heart disease Mother   . Stroke Father   . Cancer Maternal Grandmother   . Stroke Paternal Grandmother      Allergies  Allergen Reactions  . Amoxicillin     REACTION: unspecified  . Cyclobenzaprine Hcl     REACTION: rash  . Mupirocin     Unknown reaction  . Penicillins Hives  . Tetanus Toxoid Hives    Current Outpatient Prescriptions on File Prior to Visit  Medication Sig Dispense Refill  . acetaminophen (TYLENOL) 500 MG tablet Take 500 mg by mouth every 6 (six) hours as needed (for pain.).      Marland Kitchen. aspirin EC 81 MG tablet Take 81 mg by mouth daily.      . calcium carbonate (TUMS - DOSED IN MG ELEMENTAL CALCIUM) 500 MG chewable tablet Chew 2 tablets by mouth 2 (two) times daily.       . cetirizine (ZYRTEC) 10 MG tablet Take 10 mg by mouth daily as needed. Alternating with Hydroxyzine  For allergies      . folic acid (FOLVITE) 1 MG tablet Take 1 mg by mouth 2 (two) times daily.      . furosemide (LASIX) 40 MG tablet Take 20-40 mg by mouth as needed. As needed for sweling      . hydrochlorothiazide (HYDRODIURIL) 25 MG tablet Take 25 mg by mouth daily.      . hydrochlorothiazide (HYDRODIURIL) 25 MG tablet TAKE 1 TABLET BY MOUTH EVERY MORNING  90 tablet  1  . HYDROcodone-acetaminophen (NORCO/VICODIN) 5-325 MG per tablet Take 1 tablet by mouth every 6 (six) hours as needed for moderate pain.  60 tablet  0  . hydroxychloroquine (PLAQUENIL) 200 MG tablet Take 200 mg by mouth 2 (two) times daily.       . hydrOXYzine (ATARAX/VISTARIL) 25 MG tablet Take 25 mg by mouth every 4 (four) hours as needed. For allergies alternate with Zyrtec      . methotrexate (RHEUMATREX) 2.5 MG tablet Take 15 mg by mouth once a week. Caution:Chemotherapy. Protect from light.      . metoprolol succinate (TOPROL-XL) 50 MG 24 hr tablet TAKE 1 TABLET EVERY DAY  30 tablet  4  . potassium chloride SA (KLOR-CON M20) 20 MEQ tablet Take 10-20 mEq by mouth as needed. As needed with Furosemide for fluid retention      . simvastatin (ZOCOR) 40 MG tablet Take  20 mg by mouth at bedtime.        No current facility-administered  medications on file prior to visit.    BP 130/60  Pulse 90  Temp(Src) 97.7 F (36.5 C) (Oral)  Resp 20  Ht 5\' 3"  (1.6 m)  Wt 163 lb (73.936 kg)  BMI 28.88 kg/m2  SpO2 95%       Review of Systems  Constitutional: Negative.   HENT: Negative for congestion, dental problem, hearing loss, rhinorrhea, sinus pressure, sore throat and tinnitus.   Eyes: Negative for pain, discharge and visual disturbance.  Respiratory: Negative for cough and shortness of breath.   Cardiovascular: Negative for chest pain, palpitations and leg swelling.  Gastrointestinal: Negative for nausea, vomiting, abdominal pain, diarrhea, constipation, blood in stool and abdominal distention.  Genitourinary: Negative for dysuria, urgency, frequency, hematuria, flank pain, vaginal bleeding, vaginal discharge, difficulty urinating, vaginal pain and pelvic pain.  Musculoskeletal: Positive for arthralgias, back pain and gait problem. Negative for joint swelling.  Skin: Negative for rash.  Neurological: Negative for dizziness, syncope, speech difficulty, weakness, numbness and headaches.  Hematological: Negative for adenopathy.  Psychiatric/Behavioral: Negative for behavioral problems, dysphoric mood and agitation. The patient is not nervous/anxious.        Objective:   Physical Exam  Constitutional: She is oriented to person, place, and time. She appears well-developed and well-nourished.  HENT:  Head: Normocephalic.  Right Ear: External ear normal.  Left Ear: External ear normal.  Mouth/Throat: Oropharynx is clear and moist.  Eyes: Conjunctivae and EOM are normal. Pupils are equal, round, and reactive to light.  Neck: Normal range of motion. Neck supple. No thyromegaly present.  Cardiovascular: Normal rate, regular rhythm, normal heart sounds and intact distal pulses.   Pulmonary/Chest: Effort normal and breath sounds normal.  Abdominal: Soft. Bowel sounds are normal. She exhibits no mass. There is no  tenderness.  Musculoskeletal: Normal range of motion.  Lymphadenopathy:    She has no cervical adenopathy.  Neurological: She is alert and oriented to person, place, and time.  Skin: Skin is warm and dry. No rash noted.  Scattered ecchymoses  Psychiatric: She has a normal mood and affect. Her behavior is normal.          Assessment & Plan:   Hypertension well controlled Coronary artery disease, stable Dyslipidemia.  Continue statin therapy Oseoarthritis.  Followup rheumatology  CPX in 6 months

## 2014-05-03 NOTE — Telephone Encounter (Signed)
Relevant patient education mailed to patient.  

## 2014-05-19 ENCOUNTER — Telehealth: Payer: Self-pay | Admitting: Internal Medicine

## 2014-05-19 NOTE — Telephone Encounter (Signed)
Pt is needing rx HYDROcodone-acetaminophen (NORCO/VICODIN) 5-325 MG per tablet Pt states she only has 3 tabs, if possible she can pick today.

## 2014-05-25 MED ORDER — HYDROCODONE-ACETAMINOPHEN 5-325 MG PO TABS: 1.0000 | ORAL_TABLET | Freq: Four times a day (QID) | ORAL | Status: DC | PRN

## 2014-05-25 NOTE — Telephone Encounter (Signed)
Pt notified Rx ready for pickup. Rx printed and signed.  

## 2014-05-30 ENCOUNTER — Other Ambulatory Visit: Payer: Self-pay | Admitting: Internal Medicine

## 2014-05-30 DIAGNOSIS — Z0279 Encounter for issue of other medical certificate: Secondary | ICD-10-CM

## 2014-06-01 ENCOUNTER — Telehealth: Payer: Self-pay | Admitting: Internal Medicine

## 2014-06-01 NOTE — Telephone Encounter (Signed)
LMOM to notify pt her DMV forms have been completed and is being mailed to [facility]. I also notified her of the $29.00 fee for the forms completion.

## 2014-06-23 ENCOUNTER — Telehealth: Payer: Self-pay | Admitting: Internal Medicine

## 2014-06-23 NOTE — Telephone Encounter (Signed)
Pt request refill of the following: HYDROcodone-acetaminophen (NORCO/VICODIN) 5-325 MG per tablet ° ° °Phamacy: °

## 2014-06-26 MED ORDER — HYDROCODONE-ACETAMINOPHEN 5-325 MG PO TABS: 1.0000 | ORAL_TABLET | Freq: Four times a day (QID) | ORAL | Status: DC | PRN

## 2014-06-26 NOTE — Telephone Encounter (Signed)
Left detailed message Rx ready for pickup. Rx printed and signed. 

## 2014-07-04 ENCOUNTER — Encounter (HOSPITAL_COMMUNITY): Payer: Self-pay | Admitting: Emergency Medicine

## 2014-07-04 ENCOUNTER — Emergency Department (HOSPITAL_COMMUNITY)
Admission: EM | Admit: 2014-07-04 | Discharge: 2014-07-05 | Disposition: A | Discharge status: Home / Self Care | Payer: Medicare Other | Attending: Emergency Medicine | Admitting: Emergency Medicine

## 2014-07-04 DIAGNOSIS — W1809XA Striking against other object with subsequent fall, initial encounter: Secondary | ICD-10-CM | POA: Diagnosis not present

## 2014-07-04 DIAGNOSIS — S0510XA Contusion of eyeball and orbital tissues, unspecified eye, initial encounter: Secondary | ICD-10-CM | POA: Insufficient documentation

## 2014-07-04 DIAGNOSIS — Z860100 Personal history of colon polyps, unspecified: Secondary | ICD-10-CM | POA: Insufficient documentation

## 2014-07-04 DIAGNOSIS — Z8619 Personal history of other infectious and parasitic diseases: Secondary | ICD-10-CM | POA: Diagnosis not present

## 2014-07-04 DIAGNOSIS — Y92009 Unspecified place in unspecified non-institutional (private) residence as the place of occurrence of the external cause: Secondary | ICD-10-CM | POA: Insufficient documentation

## 2014-07-04 DIAGNOSIS — Z7982 Long term (current) use of aspirin: Secondary | ICD-10-CM | POA: Insufficient documentation

## 2014-07-04 DIAGNOSIS — F3289 Other specified depressive episodes: Secondary | ICD-10-CM | POA: Diagnosis not present

## 2014-07-04 DIAGNOSIS — Z8719 Personal history of other diseases of the digestive system: Secondary | ICD-10-CM | POA: Insufficient documentation

## 2014-07-04 DIAGNOSIS — Z79899 Other long term (current) drug therapy: Secondary | ICD-10-CM | POA: Diagnosis not present

## 2014-07-04 DIAGNOSIS — F329 Major depressive disorder, single episode, unspecified: Secondary | ICD-10-CM | POA: Diagnosis not present

## 2014-07-04 DIAGNOSIS — Z9889 Other specified postprocedural states: Secondary | ICD-10-CM | POA: Insufficient documentation

## 2014-07-04 DIAGNOSIS — Z8669 Personal history of other diseases of the nervous system and sense organs: Secondary | ICD-10-CM | POA: Diagnosis not present

## 2014-07-04 DIAGNOSIS — I10 Essential (primary) hypertension: Secondary | ICD-10-CM | POA: Insufficient documentation

## 2014-07-04 DIAGNOSIS — D649 Anemia, unspecified: Secondary | ICD-10-CM | POA: Diagnosis not present

## 2014-07-04 DIAGNOSIS — M1990 Unspecified osteoarthritis, unspecified site: Secondary | ICD-10-CM | POA: Insufficient documentation

## 2014-07-04 DIAGNOSIS — G8929 Other chronic pain: Secondary | ICD-10-CM | POA: Insufficient documentation

## 2014-07-04 DIAGNOSIS — S0190XA Unspecified open wound of unspecified part of head, initial encounter: Secondary | ICD-10-CM | POA: Diagnosis present

## 2014-07-04 DIAGNOSIS — Z8742 Personal history of other diseases of the female genital tract: Secondary | ICD-10-CM | POA: Diagnosis not present

## 2014-07-04 DIAGNOSIS — S0180XA Unspecified open wound of other part of head, initial encounter: Secondary | ICD-10-CM | POA: Insufficient documentation

## 2014-07-04 DIAGNOSIS — Z951 Presence of aortocoronary bypass graft: Secondary | ICD-10-CM | POA: Diagnosis not present

## 2014-07-04 DIAGNOSIS — Z872 Personal history of diseases of the skin and subcutaneous tissue: Secondary | ICD-10-CM | POA: Insufficient documentation

## 2014-07-04 DIAGNOSIS — J45909 Unspecified asthma, uncomplicated: Secondary | ICD-10-CM | POA: Diagnosis not present

## 2014-07-04 DIAGNOSIS — E782 Mixed hyperlipidemia: Secondary | ICD-10-CM | POA: Diagnosis not present

## 2014-07-04 DIAGNOSIS — I2510 Atherosclerotic heart disease of native coronary artery without angina pectoris: Secondary | ICD-10-CM | POA: Diagnosis not present

## 2014-07-04 DIAGNOSIS — W19XXXA Unspecified fall, initial encounter: Secondary | ICD-10-CM

## 2014-07-04 DIAGNOSIS — Y939 Activity, unspecified: Secondary | ICD-10-CM | POA: Diagnosis not present

## 2014-07-04 DIAGNOSIS — S0181XA Laceration without foreign body of other part of head, initial encounter: Secondary | ICD-10-CM

## 2014-07-04 DIAGNOSIS — Z8701 Personal history of pneumonia (recurrent): Secondary | ICD-10-CM | POA: Insufficient documentation

## 2014-07-04 DIAGNOSIS — Z880 Allergy status to penicillin: Secondary | ICD-10-CM | POA: Diagnosis not present

## 2014-07-04 DIAGNOSIS — M069 Rheumatoid arthritis, unspecified: Secondary | ICD-10-CM | POA: Diagnosis not present

## 2014-07-04 NOTE — ED Notes (Signed)
Pt has hx of unstable balance and frequent falls. Pt lost her balance and fell hitting her head on the door. Pt has a laceration above her left eye. Pt takes 81 mg aspirin per day. Denies LOC. Bleeding controlled.

## 2014-07-05 ENCOUNTER — Emergency Department (HOSPITAL_COMMUNITY): Payer: Medicare Other

## 2014-07-05 MED ORDER — LIDOCAINE-EPINEPHRINE 2 %-1:100000 IJ SOLN
INTRAMUSCULAR | Status: AC
  Filled 2014-07-05: qty 1

## 2014-07-05 NOTE — ED Notes (Signed)
PT was seen and discharged during downtime - please see downtime documentation

## 2014-07-05 NOTE — ED Provider Notes (Signed)
CSN: [id number]     Arrival date & time 07/04/14  2113 History   First MD Initiated Contact with Patient 07/04/14 2306     Chief Complaint  Patient presents with  . Fall  . Head Laceration   Patient is a 80 y.o. female presenting with fall and scalp laceration.  Fall  Head Laceration    Patient is an 80 y.o. Female who presents to the ED after a fall with a laceration to the left eye brow.  Patient states that she has had some difficulty with her balance and today just lost her footing and hit her head into the door at 7:30.  Patient states that she did not lose any consciousness at this time.  She has full memory of the fall. She denies tingling, numbness, changes in her vision, nausea, vomiting, SOB, chest pain, confusion, or changes in her bowel or bladder.  Per patient's son her behavior is at baseline with no abnormalities.  She has a significant fall history and she walks with a walker.  Patient does live by herself.  Patient is up to date on her tetanus shot at this time.    Past Medical History  Diagnosis Date  . DEPRESSION 05/21/2007  . FIBROCYSTIC BREAST DISEASE, HX OF 05/21/2007  . HYPERLIPIDEMIA-MIXED 11/26/2008    takes Simvastatin daily  . Irritable bowel syndrome 05/21/2007  . KNEE PAIN 09/16/2007  . LEG EDEMA, BILATERAL 05/21/2009  . OSTEOPENIA 05/21/2007  . URTICARIA, CHRONIC 05/21/2007  . History of aortic aneurysm     resection and grafting of a 5.2-cm ascending aortic arch aneurysm August 2009  . Dyslipidemia   . Vitamin D deficiency   . Urticaria   . Neuropathy     left radial  neuropathy with wrist drop  . Diverticulosis   . Irritable bowel syndrome   . GERD (gastroesophageal reflux disease)   . 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 Vaginal yeast infection     recently told to use monistat, hasn't started as of 01/26/2012  . HYPERTENSION 05/11/2008    takes Metoprolol and HCTZ daily  . Asthma     hx of;as a child  . Seasonal allergies     takes  Hydroxyzine prn and Zyrtec 2 times a week  . Peripheral edema     takes Furosemide prn  . Pneumonia     history of;in the 90's  . Weakness     hands and feet  . Sciatica   . OSTEOARTHRITIS 01/20/2008    takes Methotrexate weekly  . DJD (degenerative joint disease)     history of   . Rheumatoid arthritis(714.0)   . Chronic back pain     takes Norco prn  . Bruises easily     takes Plaquenil daily  . CORONARY ATHEROSCLEROSIS, ARTERY BYPASS GRAFT 08/23/2008  . CAD (coronary artery disease)     s/p CABG -  x 1 with LIMA to LAD August 2009 /   . Urinary incontinence   . Bowel incontinence   . History of colon polyps   . Urinary frequency   . Urinary urgency   . Nocturia   . Anemia     takes folic acid bid  . Urinary incontinence     wears depends   Past Surgical History  Procedure Laterality Date  . Appendectomy    . Cesarean section  1968    x 1  . Hernia repair    .  Coronary artery bypass graft  August 2009    CABG x 1 with LIMA to LAD /  . Ascending aortic aneurysm repair  August 2009     resection and grafting of a 5.2-cm ascending aortic arch aneurysm  . Joint replacement      2009 & 2010- both knees  . Back surgery      2011- cerv. fusion   . Breast surgery      biopsy x2- R breast  . Tonsillectomy      as a child  . Cardiac catheterization  2009  . Colonoscopy    . Lumbar laminectomy/decompression microdiscectomy Bilateral 07/11/2013    Procedure: LUMBAR LAMINECTOMY/DECOMPRESSION MICRODISCECTOMY LUMBAR FIVE SACRAL ONE;  Surgeon: Mariam Dollar, MD;  Location: [facility];  Service: Neurosurgery;  Laterality: Bilateral;   Family History  Problem Relation Age of Onset  . Heart disease    . Coronary artery disease    . Anesthesia problems Neg Hx   . Hypotension Neg Hx   . Malignant hyperthermia Neg Hx   . Pseudochol deficiency Neg Hx   . Heart disease Mother   . Stroke Father   . Cancer Maternal Grandmother   . Stroke Paternal Grandmother    History   Substance Use Topics  . Smoking status: Never Smoker   . Smokeless tobacco: Not on file  . Alcohol Use: No   OB History   Grav Para Term Preterm Abortions TAB SAB Ect Mult Living                 Review of Systems See HPI   Allergies  Amoxicillin; Cyclobenzaprine hcl; Mupirocin; Penicillins; and Tetanus toxoid  Home Medications   Prior to Admission medications   Medication Sig Start Date End Date Taking? Authorizing Provider  acetaminophen (TYLENOL) 500 MG tablet Take 500 mg by mouth every 6 (six) hours as needed (for pain.).   Yes Historical Provider, MD  aspirin EC 81 MG tablet Take 81 mg by mouth at bedtime.    Yes Historical Provider, MD  cetirizine (ZYRTEC) 10 MG tablet Take 10 mg by mouth daily as needed. Alternating with Hydroxyzine  For allergies   Yes Historical Provider, MD  folic acid (FOLVITE) 1 MG tablet Take 1 mg by mouth 2 (two) times daily.   Yes Historical Provider, MD  hydrochlorothiazide (HYDRODIURIL) 25 MG tablet Take 25 mg by mouth daily.   Yes Historical Provider, MD  HYDROcodone-acetaminophen (NORCO/VICODIN) 5-325 MG per tablet Take 1 tablet by mouth every 6 (six) hours as needed for moderate pain. 06/26/14  Yes Gordy Savers, MD  hydroxychloroquine (PLAQUENIL) 200 MG tablet Take 200 mg by mouth 2 (two) times daily.    Yes Historical Provider, MD  hydrOXYzine (ATARAX/VISTARIL) 25 MG tablet Take 25 mg by mouth every 4 (four) hours as needed. For allergies alternate with Zyrtec   Yes Historical Provider, MD  methotrexate (RHEUMATREX) 2.5 MG tablet Take 15 mg by mouth once a week. Caution:Chemotherapy. Protect from light.   Yes Historical Provider, MD  metoprolol succinate (TOPROL-XL) 50 MG 24 hr tablet Take 50 mg by mouth daily. Take with or immediately following a meal.   Yes Historical Provider, MD  simvastatin (ZOCOR) 40 MG tablet Take 20 mg by mouth at bedtime.  09/30/11  Yes Gordy Savers, MD  furosemide (LASIX) 40 MG tablet Take 20-40 mg by mouth  as needed. As needed for sweling    Historical Provider, MD  potassium chloride SA (KLOR-CON M20) 20  MEQ tablet Take 10-20 mEq by mouth as needed. As needed with Furosemide for fluid retention 09/03/11   Gordy Savers, MD   BP 190/64  Pulse 72  Temp(Src) 97.8 F (36.6 C) (Oral)  Resp 18  Ht 5\' 3"  (1.6 m)  Wt 160 lb (72.576 kg)  BMI 28.35 kg/m2  SpO2 98% Physical Exam  Nursing note and vitals reviewed. Constitutional: She is oriented to person, place, and time. She appears well-developed and well-nourished. No distress.  HENT:  Head: Normocephalic. Head is with laceration.  Mouth/Throat: Oropharynx is clear and moist. No oropharyngeal exudate.  Mild periorbital ecchymosis  Eyes: Conjunctivae and EOM are normal. Pupils are equal, round, and reactive to light. No scleral icterus.  Neck: Normal range of motion. Neck supple. No JVD present. No thyromegaly present.  Cardiovascular: Normal rate, regular rhythm, normal heart sounds and intact distal pulses.  Exam reveals no gallop and no friction rub.   No murmur heard. Pulmonary/Chest: Effort normal and breath sounds normal. No respiratory distress. She has no wheezes. She has no rales. She exhibits no tenderness.  Abdominal: Soft. Bowel sounds are normal. She exhibits no distension and no mass. There is no tenderness. There is no rebound and no guarding.  Musculoskeletal: Normal range of motion.  Lymphadenopathy:    She has no cervical adenopathy.  Neurological: She is alert and oriented to person, place, and time. She has normal strength. No cranial nerve deficit or sensory deficit. Gait abnormal. Coordination normal.  Patient has very unsteady gait and walks with a walker at home.  Skin: Skin is warm and dry. She is not diaphoretic.  Laceration of the left eye brow with bleeding well controlled.  Laceration without any evidence of foreign bodies on palpation.  Laceration measures 3-4 cm in length.    Psychiatric: She has a normal  mood and affect. Her behavior is normal. Judgment and thought content normal.    ED Course  LACERATION REPAIR Date/Time: 07/06/2014 12:04 PM Performed by: Terri Piedra A Authorized by: Terri Piedra A Consent: Verbal consent obtained. Risks and benefits: risks, benefits and alternatives were discussed Consent given by: patient Patient understanding: patient states understanding of the procedure being performed Patient consent: the patient's understanding of the procedure matches consent given Procedure consent: procedure consent matches procedure scheduled Relevant documents: relevant documents present and verified Test results: test results available and properly labeled Site marked: the operative site was marked Imaging studies: imaging studies available Patient identity confirmed: verbally with patient Time out: Immediately prior to procedure a "time out" was called to verify the correct patient, procedure, equipment, support staff and site/side marked as required. Body area: head/neck Location details: left eyebrow Laceration length: 3 cm Foreign bodies: no foreign bodies Tendon involvement: none Nerve involvement: none Vascular damage: no Anesthesia: local infiltration Local anesthetic: lidocaine 2% with epinephrine Anesthetic total: 3 ml Patient sedated: no Preparation: Patient was prepped and draped in the usual sterile fashion. Irrigation solution: saline Irrigation method: syringe Amount of cleaning: standard Debridement: none Degree of undermining: none Skin closure: Ethilon (5-0) Number of sutures: 3 Technique: simple Approximation: close Approximation difficulty: simple Patient tolerance: Patient tolerated the procedure well with no immediate complications.   (including critical care time) Labs Review Labs Reviewed - No data to display  Imaging Review Ct Head Wo Contrast  07/05/2014   CLINICAL DATA:  Head laceration, about the left eyebrow. Hit  head on doorframe.  EXAM: CT HEAD WITHOUT CONTRAST  TECHNIQUE: Contiguous axial images were obtained  from the base of the skull through the vertex without intravenous contrast.  COMPARISON:  CT of the head performed 03/13/2014  FINDINGS: There is no evidence of acute infarction, mass lesion, or intra- or extra-axial hemorrhage on CT.  Minimal periventricular and subcortical white matter change likely reflects small vessel ischemic microangiopathy.  The posterior fossa, including the cerebellum, brainstem and fourth ventricle, is within normal limits. The third and lateral ventricles, and basal ganglia are unremarkable in appearance. The cerebral hemispheres are symmetric in appearance, with normal gray-white differentiation. No mass effect or midline shift is seen.  There is no evidence of fracture; visualized osseous structures are unremarkable in appearance. The orbits are within normal limits. The paranasal sinuses and mastoid air cells are well-aerated. No significant soft tissue abnormalities are seen. The known soft tissue laceration is not well characterized on CT.  IMPRESSION: 1. No evidence of traumatic intracranial injury or fracture. 2. Minimal small vessel ischemic microangiopathy.   Electronically Signed   By: Roanna Raider M.D.   On: 07/05/2014 01:47     EKG Interpretation None      MDM   Final diagnoses:  Fall at home, initial encounter  Laceration of forehead without complication, initial encounter    Patient is an 80 y.o. Female who presents to the ED with fall with laceration to the left forehead.  Given age and aspirin use we will perform a CT scan of the head at this time.  There are no focal neurological deficits at this time.  Patient does have an unsteady gait, but son reports this being at baseline at this time. Will suture laceration as seen above.  CT head shows no acute injury or brain bleeding at this time.  Patient is stable for discharge at this time.  Patient was told  to return for subdural and epidural hematoma symptoms, signs of laceration infection, increased intracranial pressure symptoms or any other concerning symptoms. Patient and her son state understanding and agreement at this time.  I have spoken with Dr. Preston Fleeting about this patient and he has also seen the patient. He agrees with the above plan.        Eben Burow, PA-C 07/06/14 1208

## 2014-07-05 NOTE — ED Provider Notes (Signed)
80 year old female suffered a fall at home and suffered a laceration to the left side of her forehead. There is no loss of consciousness. On exam, she is awake and alert with oblique laceration left-sided perforated. She will be sent for CT and maceration will need suture repair.  Medical screening examination/treatment/procedure(s) were conducted as a shared visit with non-physician practitioner(s) and myself.  I personally evaluated the patient during the encounter.   Dione Booze, MD 07/05/14 [id number]

## 2014-07-06 ENCOUNTER — Telehealth: Payer: Self-pay | Admitting: Internal Medicine

## 2014-07-06 NOTE — Telephone Encounter (Signed)
If follow up from ED need 30 minutes.

## 2014-07-06 NOTE — Telephone Encounter (Signed)
Pt fell and had to get 3 stitches above her eye, pt needs to come in and get the stitches out. Next week. Does pt need 30 min ov/ or just 15 min.

## 2014-07-07 NOTE — Telephone Encounter (Signed)
appt scheduled for pt.  

## 2014-07-11 ENCOUNTER — Encounter: Payer: Self-pay | Admitting: Internal Medicine

## 2014-07-11 ENCOUNTER — Ambulatory Visit (INDEPENDENT_AMBULATORY_CARE_PROVIDER_SITE_OTHER): Payer: Medicare Other | Admitting: Internal Medicine

## 2014-07-11 VITALS — BP 150/60 | HR 90 | Temp 98.1°F | Resp 20 | Ht 63.0 in | Wt 157.0 lb

## 2014-07-11 DIAGNOSIS — R2681 Unsteadiness on feet: Secondary | ICD-10-CM

## 2014-07-11 DIAGNOSIS — I10 Essential (primary) hypertension: Secondary | ICD-10-CM

## 2014-07-11 DIAGNOSIS — M1990 Unspecified osteoarthritis, unspecified site: Secondary | ICD-10-CM

## 2014-07-11 DIAGNOSIS — I2510 Atherosclerotic heart disease of native coronary artery without angina pectoris: Secondary | ICD-10-CM

## 2014-07-11 DIAGNOSIS — Z4802 Encounter for removal of sutures: Secondary | ICD-10-CM

## 2014-07-11 DIAGNOSIS — R269 Unspecified abnormalities of gait and mobility: Secondary | ICD-10-CM

## 2014-07-11 NOTE — Patient Instructions (Signed)

## 2014-07-11 NOTE — Progress Notes (Signed)
Pre visit review using our clinic review tool, if applicable. No additional management support is needed unless otherwise documented below in the visit note. 

## 2014-07-11 NOTE — Progress Notes (Signed)
Subjective:    Patient ID: [name], female    DOB: [birth year], 80 y.o.   MRN: [id number]  HPI  80 year old patient has a long history of gait instability.  She was seen in the ED 7 days ago after a recurrent fall and required suturing of a laceration of  her left eyebrow.  Following her return from the ED, she again fell, sustaining trauma to the occipital scalp area.  She has a resolving hematoma No loss of consciousness.  Seen today for suture removal She has history of coronary artery disease status post CABG, which has been stable. She has osteoarthritis.  She states that she takes hydrocodone gently once daily.  She has a history of lower extremity edema, but states she takes Lasix, perhaps twice per month.  Symptoms are not orthostatic  Past Medical History  Diagnosis Date  . DEPRESSION 05/21/2007  . FIBROCYSTIC BREAST DISEASE, HX OF 05/21/2007  . HYPERLIPIDEMIA-MIXED 11/26/2008    takes Simvastatin daily  . Irritable bowel syndrome 05/21/2007  . KNEE PAIN 09/16/2007  . LEG EDEMA, BILATERAL 05/21/2009  . OSTEOPENIA 05/21/2007  . URTICARIA, CHRONIC 05/21/2007  . History of aortic aneurysm     resection and grafting of a 5.2-cm ascending aortic arch aneurysm August 2009  . Dyslipidemia   . Vitamin D deficiency   . Urticaria   . Neuropathy     left radial  neuropathy with wrist drop  . Diverticulosis   . Irritable bowel syndrome   . GERD (gastroesophageal reflux disease)   . 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 Vaginal yeast infection     recently told to use monistat, hasn't started as of 01/26/2012  . HYPERTENSION 05/11/2008    takes Metoprolol and HCTZ daily  . Asthma     hx of;as a child  . Seasonal allergies     takes Hydroxyzine prn and Zyrtec 2 times a week  . Peripheral edema     takes Furosemide prn  . Pneumonia     history of;in the 90's  . Weakness     hands and feet  . Sciatica   . OSTEOARTHRITIS 01/20/2008    takes Methotrexate weekly  .  DJD (degenerative joint disease)     history of   . Rheumatoid arthritis(714.0)   . Chronic back pain     takes Norco prn  . Bruises easily     takes Plaquenil daily  . CORONARY ATHEROSCLEROSIS, ARTERY BYPASS GRAFT 08/23/2008  . CAD (coronary artery disease)     s/p CABG -  x 1 with LIMA to LAD August 2009 /   . Urinary incontinence   . Bowel incontinence   . History of colon polyps   . Urinary frequency   . Urinary urgency   . Nocturia   . Anemia     takes folic acid bid  . Urinary incontinence     wears depends    History   Social History  . Marital Status: Widowed    Spouse Name: N/A    Number of Children: N/A  . Years of Education: N/A   Occupational History  . Not on file.   Social History Main Topics  . Smoking status: Never Smoker   . Smokeless tobacco: Not on file  . Alcohol Use: No  . Drug Use: No  . Sexual Activity: No   Other Topics Concern  . Not on file   Social History Narrative  .  No narrative on file    Past Surgical History  Procedure Laterality Date  . Appendectomy    . Cesarean section  1968    x 1  . Hernia repair    . Coronary artery bypass graft  August 2009    CABG x 1 with LIMA to LAD /  . Ascending aortic aneurysm repair  August 2009     resection and grafting of a 5.2-cm ascending aortic arch aneurysm  . Joint replacement      2009 & 2010- both knees  . Back surgery      2011- cerv. fusion   . Breast surgery      biopsy x2- R breast  . Tonsillectomy      as a child  . Cardiac catheterization  2009  . Colonoscopy    . Lumbar laminectomy/decompression microdiscectomy Bilateral 07/11/2013    Procedure: LUMBAR LAMINECTOMY/DECOMPRESSION MICRODISCECTOMY LUMBAR FIVE SACRAL ONE;  Surgeon: Mariam Dollar, MD;  Location: [facility];  Service: Neurosurgery;  Laterality: Bilateral;    Family History  Problem Relation Age of Onset  . Heart disease    . Coronary artery disease    . Anesthesia problems Neg Hx   . Hypotension Neg Hx     . Malignant hyperthermia Neg Hx   . Pseudochol deficiency Neg Hx   . Heart disease Mother   . Stroke Father   . Cancer Maternal Grandmother   . Stroke Paternal Grandmother     Allergies  Allergen Reactions  . Amoxicillin     REACTION: unspecified  . Cyclobenzaprine Hcl     REACTION: rash  . Mupirocin     Unknown reaction  . Penicillins Hives  . Tetanus Toxoid Hives    Current Outpatient Prescriptions on File Prior to Visit  Medication Sig Dispense Refill  . acetaminophen (TYLENOL) 500 MG tablet Take 500 mg by mouth every 6 (six) hours as needed (for pain.).      Marland Kitchen aspirin EC 81 MG tablet Take 81 mg by mouth at bedtime.       . cetirizine (ZYRTEC) 10 MG tablet Take 10 mg by mouth daily as needed. Alternating with Hydroxyzine  For allergies      . folic acid (FOLVITE) 1 MG tablet Take 1 mg by mouth 2 (two) times daily.      . furosemide (LASIX) 40 MG tablet Take 20-40 mg by mouth as needed. As needed for sweling      . hydrochlorothiazide (HYDRODIURIL) 25 MG tablet Take 25 mg by mouth daily.      Marland Kitchen HYDROcodone-acetaminophen (NORCO/VICODIN) 5-325 MG per tablet Take 1 tablet by mouth every 6 (six) hours as needed for moderate pain.  60 tablet  0  . hydroxychloroquine (PLAQUENIL) 200 MG tablet Take 200 mg by mouth 2 (two) times daily.       . hydrOXYzine (ATARAX/VISTARIL) 25 MG tablet Take 25 mg by mouth every 4 (four) hours as needed. For allergies alternate with Zyrtec      . methotrexate (RHEUMATREX) 2.5 MG tablet Take 15 mg by mouth once a week. Caution:Chemotherapy. Protect from light.      . metoprolol succinate (TOPROL-XL) 50 MG 24 hr tablet Take 50 mg by mouth daily. Take with or immediately following a meal.      . potassium chloride SA (KLOR-CON M20) 20 MEQ tablet Take 10-20 mEq by mouth as needed. As needed with Furosemide for fluid retention      . simvastatin (ZOCOR) 40 MG tablet  Take 20 mg by mouth at bedtime.        No current facility-administered medications on file  prior to visit.    BP 150/60  Pulse 90  Temp(Src) 98.1 F (36.7 C) (Oral)  Resp 20  Ht 5\' 3"  (1.6 m)  Wt 157 lb (71.215 kg)  BMI 27.82 kg/m2  SpO2 97%      Review of Systems  Constitutional: Negative.   HENT: Negative for congestion, dental problem, hearing loss, rhinorrhea, sinus pressure, sore throat and tinnitus.   Eyes: Negative for pain, discharge and visual disturbance.  Respiratory: Negative for cough and shortness of breath.   Cardiovascular: Negative for chest pain, palpitations and leg swelling.  Gastrointestinal: Negative for nausea, vomiting, abdominal pain, diarrhea, constipation, blood in stool and abdominal distention.  Genitourinary: Negative for dysuria, urgency, frequency, hematuria, flank pain, vaginal bleeding, vaginal discharge, difficulty urinating, vaginal pain and pelvic pain.  Musculoskeletal: Positive for arthralgias, back pain and gait problem. Negative for joint swelling.  Skin: Positive for wound. Negative for rash.  Neurological: Negative for dizziness, syncope, speech difficulty, weakness, numbness and headaches.  Hematological: Negative for adenopathy.  Psychiatric/Behavioral: Negative for behavioral problems, dysphoric mood and agitation. The patient is not nervous/anxious.        Objective:   Physical Exam  Constitutional: She appears well-developed and well-nourished. No distress.  Uses a 4. Walker for ambulation Blood pressure normal  Skin:  Right occipital scalp hematoma, approximately 4 cm  Laceration involving the left eyebrow area.  3 sutures removed          Assessment & Plan:   Gait instability Suture removal Hypertension stable CAD stable

## 2014-07-12 ENCOUNTER — Other Ambulatory Visit: Payer: Self-pay | Admitting: Internal Medicine

## 2014-08-02 ENCOUNTER — Telehealth: Payer: Self-pay | Admitting: Internal Medicine

## 2014-08-02 MED ORDER — HYDROCODONE-ACETAMINOPHEN 5-325 MG PO TABS: 1.0000 | ORAL_TABLET | Freq: Four times a day (QID) | ORAL | Status: DC | PRN

## 2014-08-02 NOTE — Telephone Encounter (Signed)
Pt request refill of the following:   HYDROcodone-acetaminophen (NORCO/VICODIN) 5-325 MG per tablet ° ° ° °Phamacy:   Pick up  ° °

## 2014-08-03 NOTE — Telephone Encounter (Signed)
Left detailed message Rx ready for pickup. Rx printed and signed. 

## 2014-08-28 ENCOUNTER — Telehealth: Payer: Self-pay | Admitting: Internal Medicine

## 2014-08-28 MED ORDER — FUROSEMIDE 40 MG PO TABS: 20.0000 mg | ORAL_TABLET | ORAL | Status: DC | PRN

## 2014-08-28 NOTE — Telephone Encounter (Signed)
[id number] - [place], [place] - [address] is requesting re-fill on furosemide (LASIX) 40 MG tablet

## 2014-08-28 NOTE — Telephone Encounter (Signed)
Rx sent 

## 2014-08-28 NOTE — Telephone Encounter (Signed)
Pt request refill HYDROcodone-acetaminophen (NORCO/VICODIN) 5-325 MG per tablet °

## 2014-08-29 ENCOUNTER — Ambulatory Visit: Payer: Medicare Other

## 2014-08-29 MED ORDER — HYDROCODONE-ACETAMINOPHEN 5-325 MG PO TABS: 1.0000 | ORAL_TABLET | Freq: Four times a day (QID) | ORAL | Status: DC | PRN

## 2014-08-29 NOTE — Telephone Encounter (Signed)
Pt notified Rx ready for pickup. Rx printed and signed.  

## 2014-09-01 ENCOUNTER — Ambulatory Visit: Payer: Medicare Other | Admitting: Internal Medicine

## 2014-09-01 ENCOUNTER — Ambulatory Visit (INDEPENDENT_AMBULATORY_CARE_PROVIDER_SITE_OTHER): Payer: Medicare Other | Admitting: *Deleted

## 2014-09-01 DIAGNOSIS — Z23 Encounter for immunization: Secondary | ICD-10-CM

## 2014-09-16 ENCOUNTER — Other Ambulatory Visit: Payer: Self-pay | Admitting: Internal Medicine

## 2014-09-22 ENCOUNTER — Telehealth: Payer: Self-pay | Admitting: Internal Medicine

## 2014-09-22 MED ORDER — HYDROCODONE-ACETAMINOPHEN 5-325 MG PO TABS: 1.0000 | ORAL_TABLET | Freq: Four times a day (QID) | ORAL | Status: DC | PRN

## 2014-09-22 NOTE — Telephone Encounter (Signed)
Pt request refill of the following: HYDROcodone-acetaminophen (NORCO/VICODIN) 5-325 MG per tablet ° ° °Phamacy: pick up  ° °

## 2014-09-22 NOTE — Telephone Encounter (Signed)
Left detailed message Rx ready for pickup on home and mobile. Rx printed and signed.

## 2014-09-23 NOTE — Progress Notes (Signed)
This encounter was created in error - please disregard.

## 2014-09-25 ENCOUNTER — Encounter: Payer: Medicare Other | Admitting: Internal Medicine

## 2014-09-25 ENCOUNTER — Ambulatory Visit: Payer: Medicare Other | Admitting: Internal Medicine

## 2014-10-18 ENCOUNTER — Telehealth: Payer: Self-pay | Admitting: Internal Medicine

## 2014-10-18 MED ORDER — HYDROCODONE-ACETAMINOPHEN 5-325 MG PO TABS: 1.0000 | ORAL_TABLET | Freq: Four times a day (QID) | ORAL | Status: DC | PRN

## 2014-10-18 NOTE — Telephone Encounter (Signed)
Dr Kirtland Bouchard out for 3 weeks.  Can you sign for it?

## 2014-10-18 NOTE — Telephone Encounter (Signed)
Ok to RF x 1

## 2014-10-18 NOTE — Telephone Encounter (Signed)
rx will be ready on Monday 10/23/14, pt aware

## 2014-10-18 NOTE — Telephone Encounter (Signed)
Pt request refill HYDROcodone-acetaminophen (NORCO/VICODIN) 5-325 MG per tablet °

## 2014-10-24 ENCOUNTER — Encounter: Payer: Self-pay | Admitting: Internal Medicine

## 2014-10-26 ENCOUNTER — Ambulatory Visit: Payer: Medicare Other | Attending: Neurosurgery

## 2014-10-30 ENCOUNTER — Ambulatory Visit: Payer: Medicare Other

## 2014-11-01 ENCOUNTER — Ambulatory Visit: Payer: Medicare Other | Admitting: Physical Therapy

## 2014-11-02 ENCOUNTER — Ambulatory Visit: Payer: Medicare Other | Admitting: Internal Medicine

## 2014-11-02 ENCOUNTER — Ambulatory Visit (INDEPENDENT_AMBULATORY_CARE_PROVIDER_SITE_OTHER): Payer: Medicare Other | Admitting: Family Medicine

## 2014-11-02 ENCOUNTER — Telehealth: Payer: Self-pay | Admitting: Internal Medicine

## 2014-11-02 ENCOUNTER — Encounter: Payer: Self-pay | Admitting: Family Medicine

## 2014-11-02 VITALS — BP 140/68 | HR 95 | Temp 97.3°F | Wt 154.0 lb

## 2014-11-02 DIAGNOSIS — L03116 Cellulitis of left lower limb: Secondary | ICD-10-CM

## 2014-11-02 DIAGNOSIS — S81802A Unspecified open wound, left lower leg, initial encounter: Secondary | ICD-10-CM

## 2014-11-02 MED ORDER — DOXYCYCLINE HYCLATE 100 MG PO CAPS: 100.0000 mg | ORAL_CAPSULE | Freq: Two times a day (BID) | ORAL | Status: DC

## 2014-11-02 NOTE — Telephone Encounter (Signed)
Pt called to ask if she should start her physical therapy now or if she need to wait until after her visit with the wound center .

## 2014-11-02 NOTE — Progress Notes (Signed)
Subjective:    Patient ID: [name], female    DOB: Apr 20, 1932, 80 y.o.   MRN: [id number]  HPI  patient is seen with left leg wound. She apparently has a very long history of difficulty healing leg wounds. She has previously been seen at wound care center. She states she had leg wounds right and left legs for almost 2 years and these were recently healed but about a week ago she noticed one left lower leg had broken back open. She is not aware of any recent injury. She has some chronic edema which is unchanged. She's not had any purulent drainage or any foul smell. She has noticed some increased erythema past few days. No fevers or chills.   no history of diabetes. She does have history of CAD , gait instability with frequent falls, hypertension, hyperlipidemia , osteoarthritis. She does not have any known arterial circulation issues. She uses compression garments though somewhat inconsistently. Allergy to penicillin  Past Medical History  Diagnosis Date  . DEPRESSION 05/21/2007  . FIBROCYSTIC BREAST DISEASE, HX OF 05/21/2007  . HYPERLIPIDEMIA-MIXED 11/26/2008    takes Simvastatin daily  . Irritable bowel syndrome 05/21/2007  . KNEE PAIN 09/16/2007  . LEG EDEMA, BILATERAL 05/21/2009  . OSTEOPENIA 05/21/2007  . URTICARIA, CHRONIC 05/21/2007  . History of aortic aneurysm     resection and grafting of a 5.2-cm ascending aortic arch aneurysm August 2009  . Dyslipidemia   . Vitamin D deficiency   . Urticaria   . Neuropathy     left radial  neuropathy with wrist drop  . Diverticulosis   . Irritable bowel syndrome   . GERD (gastroesophageal reflux disease)   . 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 Vaginal yeast infection     recently told to use monistat, hasn't started as of 01/26/2012  . HYPERTENSION 05/11/2008    takes Metoprolol and HCTZ daily  . Asthma     hx of;as a child  . Seasonal allergies     takes Hydroxyzine prn and Zyrtec 2 times a week  . Peripheral edema     takes Furosemide prn  . Pneumonia     history of;in the 90's  . Weakness     hands and feet  . Sciatica   . OSTEOARTHRITIS 01/20/2008    takes Methotrexate weekly  . DJD (degenerative joint disease)     history of   . Rheumatoid arthritis(714.0)   . Chronic back pain     takes Norco prn  . Bruises easily     takes Plaquenil daily  . CORONARY ATHEROSCLEROSIS, ARTERY BYPASS GRAFT 08/23/2008  . CAD (coronary artery disease)     s/p CABG -  x 1 with LIMA to LAD August 2009 /   . Urinary incontinence   . Bowel incontinence   . History of colon polyps   . Urinary frequency   . Urinary urgency   . Nocturia   . Anemia     takes folic acid bid  . Urinary incontinence     wears depends   Past Surgical History  Procedure Laterality Date  . Appendectomy    . Cesarean section  1968    x 1  . Hernia repair    . Coronary artery bypass graft  August 2009    CABG x 1 with LIMA to LAD /  . Ascending aortic aneurysm repair  August 2009     resection and grafting  of a 5.2-cm ascending aortic arch aneurysm  . Joint replacement      2009 & 2010- both knees  . Back surgery      2011- cerv. fusion   . Breast surgery      biopsy x2- R breast  . Tonsillectomy      as a child  . Cardiac catheterization  2009  . Colonoscopy    . Lumbar laminectomy/decompression microdiscectomy Bilateral 07/11/2013    Procedure: LUMBAR LAMINECTOMY/DECOMPRESSION MICRODISCECTOMY LUMBAR FIVE SACRAL ONE;  Surgeon: Mariam Dollar, MD;  Location: [facility];  Service: Neurosurgery;  Laterality: Bilateral;    reports that she has never smoked. She does not have any smokeless tobacco history on file. She reports that she does not drink alcohol or use illicit drugs. family history includes Cancer in her maternal grandmother; Coronary artery disease in an other family member; Heart disease in her mother and another family member; Stroke in her father and paternal grandmother. There is no history of Anesthesia problems,  Hypotension, Malignant hyperthermia, or Pseudochol deficiency. Allergies  Allergen Reactions  . Amoxicillin     REACTION: unspecified  . Cyclobenzaprine Hcl     REACTION: rash  . Mupirocin     Unknown reaction  . Penicillins Hives  . Tetanus Toxoid Hives      Review of Systems  Constitutional: Negative for fever and chills.  Respiratory: Negative for shortness of breath.   Cardiovascular: Negative for chest pain.  Neurological: Negative for dizziness and weakness.       Objective:   Physical Exam  Constitutional: She appears well-developed and well-nourished.  Cardiovascular: Normal rate and regular rhythm.   Pulmonary/Chest: Effort normal and breath sounds normal. No respiratory distress. She has no wheezes. She has no rales.  Skin:  Left lateral leg reveals superficial wound 1.5 x 2 cm. She has a little yellow crusted drainage but no visible purulent drainage. Superior to this about 6 cm is very small eschar about 4 mm diameter with some surrounding erythema and in zone about 2 cm. Minimal warmth. Minimally tender. She has trace edema legs bilaterally. Good capillary refill to feet          Assessment & Plan:  Open wound left leg. History of difficult healing. She appears to have some early cellulitis changes as well. Start doxycycline. She is allergic to penicillin. Set up referral back to wound care center. Discussed wound care. Keep clean with soap and water. Avoid topical agents such as hydroperoxide, Betadine, or alcohol.

## 2014-11-02 NOTE — Telephone Encounter (Signed)
Should be OK to start.

## 2014-11-02 NOTE — Patient Instructions (Signed)

## 2014-11-02 NOTE — Progress Notes (Signed)
Pre visit review using our clinic review tool, if applicable. No additional management support is needed unless otherwise documented below in the visit note. 

## 2014-11-03 ENCOUNTER — Ambulatory Visit: Payer: Medicare Other | Admitting: Internal Medicine

## 2014-11-03 NOTE — Telephone Encounter (Signed)
Left message for patient  To return call  

## 2014-11-10 ENCOUNTER — Encounter (HOSPITAL_BASED_OUTPATIENT_CLINIC_OR_DEPARTMENT_OTHER): Payer: Medicare Other | Attending: Internal Medicine

## 2014-11-10 DIAGNOSIS — L97929 Non-pressure chronic ulcer of unspecified part of left lower leg with unspecified severity: Secondary | ICD-10-CM | POA: Insufficient documentation

## 2014-11-10 DIAGNOSIS — I87332 Chronic venous hypertension (idiopathic) with ulcer and inflammation of left lower extremity: Secondary | ICD-10-CM | POA: Diagnosis not present

## 2014-11-11 NOTE — Progress Notes (Signed)
[facility]  NAMETONAE, LIVOLSI NO.:  [id number]  MEDICAL RECORD NO.:  [id number]      DATE OF BIRTH:  19-Jan-1932  PHYSICIAN:  Maxwell Caul, M.D.      VISIT DATE:                                  OFFICE VISIT   [name] is a patient that we have followed, I gather episodically and for prolonged periods for severe bilateral venous wounds.  She was last here roughly a year ago.  She has had wounds on her left leg and right leg which apparently were healed out.  Probably about a 2 week period, she noticed that one on the left anterior leg that had broken open.  She was not aware of any injury.  She has chronic edema which is not much change, but she is concerned about "indentation" around the wound area and wonders whether she had an infection.  There are no fever, chills. No systemic signs.  The patient does not have a history of significant PAD.  She is not a diabetic.  She has significant gait instability with falls, uses a walker.  PAST MEDICAL HISTORY:  Includes depression, fibrocystic breast disease, hyperlipidemia, severe bilateral venous stasis, osteopenia, chronic urticaria, history of an aortic aneurysm resection and grafting, vitamin D deficiency, neuropathy, diverticulosis, gastroesophageal reflux, hypertension, osteoarthritis, rheumatoid arthritis, chronic back pain, CABG in 2009, urinary and bowel incontinence.  PAST SURGICAL HISTORY:  Includes coronary artery disease with a CABG in 2009, ascending aortic aneurysm repair in 2009, previous joint replacement of both knees, back surgery with a cervical fusion in 2011, breast biopsy x2, lumbar laminectomy and decompression, microdiskectomy in August 2014.  MEDICATION LIST:  Reviewed.  She is on methotrexate.  She is not on steroids.  PHYSICAL EXAMINATION:  VITAL SIGNS:  Temperature is 98, pulse 67, blood pressure 142/38.  Vascular assessment as calculated in the  clinic, ABI was 1.3 on the left suggesting some degree of calcified vessels, nevertheless, she had a good peripheral pulses and no evidence of active ischemia.  The wound in question was on the left lateral lower extremity measuring 2.6 x 1 x 0.1.  The area was small, not surrounded by significant infection, but severe changes of venous stasis dermatitis.  She has some degree of edema.  The area was anesthetized with topical lidocaine and debrided of a fibrinous adherent eschar.  She tolerated this well.  IMPRESSION:  Recurrent venous hypertension, inflammation, and ulceration.  We debrided this as noted above.  We used Santyl, Hydrogel with a foam cover under a Profore Lite wrap.  She will return in 1 week for our review of this area.          ______________________________ Maxwell Caul, M.D.     MGR/MEDQ  D:  11/10/2014  T:  11/11/2014  Job:  [id number]

## 2014-11-13 ENCOUNTER — Ambulatory Visit (INDEPENDENT_AMBULATORY_CARE_PROVIDER_SITE_OTHER): Payer: Medicare Other | Admitting: Internal Medicine

## 2014-11-13 ENCOUNTER — Encounter: Payer: Self-pay | Admitting: *Deleted

## 2014-11-13 ENCOUNTER — Encounter: Payer: Self-pay | Admitting: Internal Medicine

## 2014-11-13 ENCOUNTER — Other Ambulatory Visit: Payer: Self-pay | Admitting: Internal Medicine

## 2014-11-13 VITALS — BP 140/70 | HR 81 | Temp 97.6°F | Resp 20 | Ht 63.0 in | Wt 154.0 lb

## 2014-11-13 DIAGNOSIS — M159 Polyosteoarthritis, unspecified: Secondary | ICD-10-CM

## 2014-11-13 DIAGNOSIS — R2681 Unsteadiness on feet: Secondary | ICD-10-CM

## 2014-11-13 DIAGNOSIS — I2510 Atherosclerotic heart disease of native coronary artery without angina pectoris: Secondary | ICD-10-CM

## 2014-11-13 DIAGNOSIS — M150 Primary generalized (osteo)arthritis: Secondary | ICD-10-CM

## 2014-11-13 DIAGNOSIS — E785 Hyperlipidemia, unspecified: Secondary | ICD-10-CM

## 2014-11-13 DIAGNOSIS — I10 Essential (primary) hypertension: Secondary | ICD-10-CM

## 2014-11-13 MED ORDER — HYDROCODONE-ACETAMINOPHEN 5-325 MG PO TABS: 1.0000 | ORAL_TABLET | Freq: Four times a day (QID) | ORAL | Status: DC | PRN

## 2014-11-13 MED ORDER — FLUCONAZOLE 150 MG PO TABS: 150.0000 mg | ORAL_TABLET | Freq: Once | ORAL | Status: DC

## 2014-11-13 NOTE — Patient Instructions (Signed)
Hold simvastatin for 1 week after use of Diflucan  Limit your sodium (Salt) intake  Return in 6 months for follow-up

## 2014-11-13 NOTE — Progress Notes (Signed)
Pre visit review using our clinic review tool, if applicable. No additional management support is needed unless otherwise documented below in the visit note. 

## 2014-11-13 NOTE — Progress Notes (Signed)
Subjective:    Patient ID: [name], female    DOB: 09/19/1932, 80 y.o.   MRN: [id number]  HPI 80 year old patient who is seen today for her six-month follow-up. She has a history of RA and DJD and is followed by rheumatology.  She does have serial blood work performed.  She is on plaquinil  and does see ophthalmology at least twice annually. She has coronary artery disease which has been stable.  She has hypertension and dyslipidemia. She was seen recently for an open wound involving her left lower lateral leg with some associated cellulitis.  She is now followed at the wound clinic again.  She is scheduled for follow-up in 2 days.  She has been treated recently with doxycycline and has noted a vaginal yeast infection.  Past Medical History  Diagnosis Date  . DEPRESSION 05/21/2007  . FIBROCYSTIC BREAST DISEASE, HX OF 05/21/2007  . HYPERLIPIDEMIA-MIXED 11/26/2008    takes Simvastatin daily  . Irritable bowel syndrome 05/21/2007  . KNEE PAIN 09/16/2007  . LEG EDEMA, BILATERAL 05/21/2009  . OSTEOPENIA 05/21/2007  . URTICARIA, CHRONIC 05/21/2007  . History of aortic aneurysm     resection and grafting of a 5.2-cm ascending aortic arch aneurysm August 2009  . Dyslipidemia   . Vitamin D deficiency   . Urticaria   . Neuropathy     left radial  neuropathy with wrist drop  . Diverticulosis   . Irritable bowel syndrome   . GERD (gastroesophageal reflux disease)   . 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 Vaginal yeast infection     recently told to use monistat, hasn't started as of 01/26/2012  . HYPERTENSION 05/11/2008    takes Metoprolol and HCTZ daily  . Asthma     hx of;as a child  . Seasonal allergies     takes Hydroxyzine prn and Zyrtec 2 times a week  . Peripheral edema     takes Furosemide prn  . Pneumonia     history of;in the 90's  . Weakness     hands and feet  . Sciatica   . OSTEOARTHRITIS 01/20/2008    takes Methotrexate weekly  . DJD (degenerative joint  disease)     history of   . Rheumatoid arthritis(714.0)   . Chronic back pain     takes Norco prn  . Bruises easily     takes Plaquenil daily  . CORONARY ATHEROSCLEROSIS, ARTERY BYPASS GRAFT 08/23/2008  . CAD (coronary artery disease)     s/p CABG -  x 1 with LIMA to LAD August 2009 /   . Urinary incontinence   . Bowel incontinence   . History of colon polyps   . Urinary frequency   . Urinary urgency   . Nocturia   . Anemia     takes folic acid bid  . Urinary incontinence     wears depends    History   Social History  . Marital Status: Widowed    Spouse Name: N/A    Number of Children: N/A  . Years of Education: N/A   Occupational History  . Not on file.   Social History Main Topics  . Smoking status: Never Smoker   . Smokeless tobacco: Not on file  . Alcohol Use: No  . Drug Use: No  . Sexual Activity: No   Other Topics Concern  . Not on file   Social History Narrative    Past Surgical History  Procedure Laterality Date  . Appendectomy    . Cesarean section  1968    x 1  . Hernia repair    . Coronary artery bypass graft  August 2009    CABG x 1 with LIMA to LAD /  . Ascending aortic aneurysm repair  August 2009     resection and grafting of a 5.2-cm ascending aortic arch aneurysm  . Joint replacement      2009 & 2010- both knees  . Back surgery      2011- cerv. fusion   . Breast surgery      biopsy x2- R breast  . Tonsillectomy      as a child  . Cardiac catheterization  2009  . Colonoscopy    . Lumbar laminectomy/decompression microdiscectomy Bilateral 07/11/2013    Procedure: LUMBAR LAMINECTOMY/DECOMPRESSION MICRODISCECTOMY LUMBAR FIVE SACRAL ONE;  Surgeon: Mariam Dollar, MD;  Location: [facility];  Service: Neurosurgery;  Laterality: Bilateral;    Family History  Problem Relation Age of Onset  . Heart disease    . Coronary artery disease    . Anesthesia problems Neg Hx   . Hypotension Neg Hx   . Malignant hyperthermia Neg Hx   . Pseudochol  deficiency Neg Hx   . Heart disease Mother   . Stroke Father   . Cancer Maternal Grandmother   . Stroke Paternal Grandmother     Allergies  Allergen Reactions  . Amoxicillin     REACTION: unspecified  . Cyclobenzaprine Hcl     REACTION: rash  . Mupirocin     Unknown reaction  . Penicillins Hives  . Tetanus Toxoid Hives    Current Outpatient Prescriptions on File Prior to Visit  Medication Sig Dispense Refill  . acetaminophen (TYLENOL) 500 MG tablet Take 500 mg by mouth every 6 (six) hours as needed (for pain.).    Marland Kitchen aspirin EC 81 MG tablet Take 81 mg by mouth at bedtime.     . cetirizine (ZYRTEC) 10 MG tablet Take 10 mg by mouth daily as needed. Alternating with Hydroxyzine  For allergies    . doxycycline (VIBRAMYCIN) 100 MG capsule Take 1 capsule (100 mg total) by mouth 2 (two) times daily. 20 capsule 0  . folic acid (FOLVITE) 1 MG tablet Take 1 mg by mouth 2 (two) times daily.    . furosemide (LASIX) 40 MG tablet Take 0.5-1 tablets (20-40 mg total) by mouth as needed. As needed for sweling 30 tablet 5  . hydrochlorothiazide (HYDRODIURIL) 25 MG tablet Take 25 mg by mouth daily.    . hydroxychloroquine (PLAQUENIL) 200 MG tablet Take 200 mg by mouth 2 (two) times daily.     . hydrOXYzine (ATARAX/VISTARIL) 25 MG tablet Take 25 mg by mouth every 4 (four) hours as needed. For allergies alternate with Zyrtec    . KLOR-CON M20 20 MEQ tablet TAKE 1 TABLET BY MOUTH TWICE A WEEK WITH FUROSEMIDE 12 tablet 3  . methotrexate (RHEUMATREX) 2.5 MG tablet Take 15 mg by mouth once a week. Caution:Chemotherapy. Protect from light.    . metoprolol succinate (TOPROL-XL) 50 MG 24 hr tablet TAKE 1 TABLET BY MOUTH EVERY DAY 30 tablet 4  . simvastatin (ZOCOR) 40 MG tablet Take 20 mg by mouth at bedtime.      No current facility-administered medications on file prior to visit.    BP 140/70 mmHg  Pulse 81  Temp(Src) 97.6 F (36.4 C) (Oral)  Resp 20  Ht 5\' 3"  (1.6 m)  Wt 154 lb (69.854 kg)  BMI  27.29 kg/m2  SpO2 98%      Review of Systems  Constitutional: Negative.   HENT: Negative for congestion, dental problem, hearing loss, rhinorrhea, sinus pressure, sore throat and tinnitus.   Eyes: Negative for pain, discharge and visual disturbance.  Respiratory: Negative for cough and shortness of breath.   Cardiovascular: Negative for chest pain, palpitations and leg swelling.  Gastrointestinal: Negative for nausea, vomiting, abdominal pain, diarrhea, constipation, blood in stool and abdominal distention.  Genitourinary: Negative for dysuria, urgency, frequency, hematuria, flank pain, vaginal bleeding, vaginal discharge, difficulty urinating, vaginal pain and pelvic pain.  Musculoskeletal: Positive for arthralgias and gait problem. Negative for joint swelling.  Skin: Negative for rash.  Neurological: Negative for dizziness, syncope, speech difficulty, weakness, numbness and headaches.  Hematological: Negative for adenopathy.  Psychiatric/Behavioral: Negative for behavioral problems, dysphoric mood and agitation. The patient is not nervous/anxious.        Objective:   Physical Exam  Constitutional: She is oriented to person, place, and time. She appears well-developed and well-nourished.  Uses a walker Repeat blood pressure 120/72  HENT:  Head: Normocephalic.  Right Ear: External ear normal.  Left Ear: External ear normal.  Mouth/Throat: Oropharynx is clear and moist.  Eyes: Conjunctivae and EOM are normal. Pupils are equal, round, and reactive to light.  Neck: Normal range of motion. Neck supple. No thyromegaly present.  Cardiovascular: Normal rate, regular rhythm, normal heart sounds and intact distal pulses.   Pulmonary/Chest: Effort normal and breath sounds normal.  Abdominal: Soft. Bowel sounds are normal. She exhibits no mass. There is no tenderness.  Musculoskeletal: Normal range of motion.  Lymphadenopathy:    She has no cervical adenopathy.  Neurological: She is  alert and oriented to person, place, and time.  Skin: Skin is warm and dry. No rash noted.  Unna boot left lower leg  Psychiatric: She has a normal mood and affect. Her behavior is normal.          Assessment & Plan:   Hypertension, well-controlled Dyslipidemia.  Will continue statin therapy Vaginitis in the setting of recent antibiotic use.  Patient has had a very difficult time with Monistat.  Will treat with Diflucan and put statin therapy on a temporary hold Gait abnormality  Medications updated Recheck 6 months

## 2014-11-16 DIAGNOSIS — L97929 Non-pressure chronic ulcer of unspecified part of left lower leg with unspecified severity: Secondary | ICD-10-CM | POA: Diagnosis not present

## 2014-11-16 DIAGNOSIS — I87332 Chronic venous hypertension (idiopathic) with ulcer and inflammation of left lower extremity: Secondary | ICD-10-CM | POA: Diagnosis not present

## 2014-11-23 ENCOUNTER — Emergency Department (HOSPITAL_BASED_OUTPATIENT_CLINIC_OR_DEPARTMENT_OTHER): Payer: Medicare Other

## 2014-11-23 ENCOUNTER — Encounter (HOSPITAL_BASED_OUTPATIENT_CLINIC_OR_DEPARTMENT_OTHER): Payer: Self-pay | Admitting: *Deleted

## 2014-11-23 ENCOUNTER — Emergency Department (HOSPITAL_BASED_OUTPATIENT_CLINIC_OR_DEPARTMENT_OTHER)
Admission: EM | Admit: 2014-11-23 | Discharge: 2014-11-23 | Disposition: A | Discharge status: Home / Self Care | Payer: Medicare Other | Attending: Emergency Medicine | Admitting: Emergency Medicine

## 2014-11-23 DIAGNOSIS — L97929 Non-pressure chronic ulcer of unspecified part of left lower leg with unspecified severity: Secondary | ICD-10-CM | POA: Diagnosis not present

## 2014-11-23 DIAGNOSIS — S0101XA Laceration without foreign body of scalp, initial encounter: Secondary | ICD-10-CM | POA: Insufficient documentation

## 2014-11-23 DIAGNOSIS — Z79899 Other long term (current) drug therapy: Secondary | ICD-10-CM | POA: Diagnosis not present

## 2014-11-23 DIAGNOSIS — D649 Anemia, unspecified: Secondary | ICD-10-CM | POA: Diagnosis not present

## 2014-11-23 DIAGNOSIS — M1990 Unspecified osteoarthritis, unspecified site: Secondary | ICD-10-CM | POA: Diagnosis not present

## 2014-11-23 DIAGNOSIS — Z8701 Personal history of pneumonia (recurrent): Secondary | ICD-10-CM | POA: Diagnosis not present

## 2014-11-23 DIAGNOSIS — Z7982 Long term (current) use of aspirin: Secondary | ICD-10-CM | POA: Insufficient documentation

## 2014-11-23 DIAGNOSIS — Y92019 Unspecified place in single-family (private) house as the place of occurrence of the external cause: Secondary | ICD-10-CM | POA: Insufficient documentation

## 2014-11-23 DIAGNOSIS — S0990XA Unspecified injury of head, initial encounter: Secondary | ICD-10-CM | POA: Diagnosis present

## 2014-11-23 DIAGNOSIS — Z8719 Personal history of other diseases of the digestive system: Secondary | ICD-10-CM | POA: Insufficient documentation

## 2014-11-23 DIAGNOSIS — I2510 Atherosclerotic heart disease of native coronary artery without angina pectoris: Secondary | ICD-10-CM | POA: Insufficient documentation

## 2014-11-23 DIAGNOSIS — Z8619 Personal history of other infectious and parasitic diseases: Secondary | ICD-10-CM | POA: Insufficient documentation

## 2014-11-23 DIAGNOSIS — Z872 Personal history of diseases of the skin and subcutaneous tissue: Secondary | ICD-10-CM | POA: Insufficient documentation

## 2014-11-23 DIAGNOSIS — Z87898 Personal history of other specified conditions: Secondary | ICD-10-CM | POA: Diagnosis not present

## 2014-11-23 DIAGNOSIS — S0191XA Laceration without foreign body of unspecified part of head, initial encounter: Secondary | ICD-10-CM

## 2014-11-23 DIAGNOSIS — Z8659 Personal history of other mental and behavioral disorders: Secondary | ICD-10-CM | POA: Insufficient documentation

## 2014-11-23 DIAGNOSIS — Z86010 Personal history of colonic polyps: Secondary | ICD-10-CM | POA: Diagnosis not present

## 2014-11-23 DIAGNOSIS — Y9389 Activity, other specified: Secondary | ICD-10-CM | POA: Diagnosis not present

## 2014-11-23 DIAGNOSIS — J45909 Unspecified asthma, uncomplicated: Secondary | ICD-10-CM | POA: Insufficient documentation

## 2014-11-23 DIAGNOSIS — I87332 Chronic venous hypertension (idiopathic) with ulcer and inflammation of left lower extremity: Secondary | ICD-10-CM | POA: Diagnosis not present

## 2014-11-23 DIAGNOSIS — G8929 Other chronic pain: Secondary | ICD-10-CM | POA: Diagnosis not present

## 2014-11-23 DIAGNOSIS — Z792 Long term (current) use of antibiotics: Secondary | ICD-10-CM | POA: Insufficient documentation

## 2014-11-23 DIAGNOSIS — Y998 Other external cause status: Secondary | ICD-10-CM | POA: Diagnosis not present

## 2014-11-23 DIAGNOSIS — Z880 Allergy status to penicillin: Secondary | ICD-10-CM | POA: Insufficient documentation

## 2014-11-23 DIAGNOSIS — I10 Essential (primary) hypertension: Secondary | ICD-10-CM | POA: Insufficient documentation

## 2014-11-23 DIAGNOSIS — W01198A Fall on same level from slipping, tripping and stumbling with subsequent striking against other object, initial encounter: Secondary | ICD-10-CM | POA: Diagnosis not present

## 2014-11-23 DIAGNOSIS — E782 Mixed hyperlipidemia: Secondary | ICD-10-CM | POA: Insufficient documentation

## 2014-11-23 DIAGNOSIS — E785 Hyperlipidemia, unspecified: Secondary | ICD-10-CM | POA: Diagnosis not present

## 2014-11-23 DIAGNOSIS — M069 Rheumatoid arthritis, unspecified: Secondary | ICD-10-CM | POA: Diagnosis not present

## 2014-11-23 MED ORDER — LIDOCAINE-EPINEPHRINE-TETRACAINE (LET) SOLUTION
3.0000 mL | Freq: Once | NASAL | Status: AC
Start: 2014-11-23 — End: 2014-11-23
  Administered 2014-11-23: 3 mL via TOPICAL
  Filled 2014-11-23: qty 3

## 2014-11-23 NOTE — ED Provider Notes (Signed)
CSN: [id number]     Arrival date & time 11/23/14  1924 History   First MD Initiated Contact with Patient 11/23/14 2116     This chart was scribed for Rolan Bucco, MD by Arlan Organ, ED Scribe. This patient was seen in room MH09/MH09 and the patient's care was started 10:37 PM.   Chief Complaint  Patient presents with  . Head Laceration   Patient is a 80 y.o. female presenting with scalp laceration. The history is provided by the patient. No language interpreter was used.  Head Laceration This is a new problem. The current episode started 3 to 5 hours ago. The problem occurs constantly. The problem has not changed since onset.Pertinent negatives include no chest pain, no abdominal pain, no headaches and no shortness of breath. Nothing aggravates the symptoms. Nothing relieves the symptoms. She has tried nothing for the symptoms.    HPI Comments: [name] is a 80 y.o. female with a PMHx of hyperlipidemia, osteopenia, dyslipidemia, neuropathy, GERD, CAD, and HTN who presents to the Emergency Department here after a fall sustained this afternoon while at her home. Pt states she fell to the ground today after turning around too quickly resulting in her hitting the back of her head against her walker. Bleeding controlled at this time. No LOC at time of impact. Dr. Leanord Hawking at the wound center looked over head laceration and placed 1 steri strip on wound. She was advised to come to ED for further evaluation. No dizziness since time of fall. [name] is not currently on any blood thinners at this time. No current back pain or neck pain. No CP or SOB. She admits to frequent falls.  Past Medical History  Diagnosis Date  . DEPRESSION 05/21/2007  . FIBROCYSTIC BREAST DISEASE, HX OF 05/21/2007  . HYPERLIPIDEMIA-MIXED 11/26/2008    takes Simvastatin daily  . Irritable bowel syndrome 05/21/2007  . KNEE PAIN 09/16/2007  . LEG EDEMA, BILATERAL 05/21/2009  . OSTEOPENIA 05/21/2007  . URTICARIA, CHRONIC  05/21/2007  . History of aortic aneurysm     resection and grafting of a 5.2-cm ascending aortic arch aneurysm August 2009  . Dyslipidemia   . Vitamin D deficiency   . Urticaria   . Neuropathy     left radial  neuropathy with wrist drop  . Diverticulosis   . Irritable bowel syndrome   . GERD (gastroesophageal reflux disease)   . 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 Vaginal yeast infection     recently told to use monistat, hasn't started as of 01/26/2012  . HYPERTENSION 05/11/2008    takes Metoprolol and HCTZ daily  . Asthma     hx of;as a child  . Seasonal allergies     takes Hydroxyzine prn and Zyrtec 2 times a week  . Peripheral edema     takes Furosemide prn  . Pneumonia     history of;in the 90's  . Weakness     hands and feet  . Sciatica   . OSTEOARTHRITIS 01/20/2008    takes Methotrexate weekly  . DJD (degenerative joint disease)     history of   . Rheumatoid arthritis(714.0)   . Chronic back pain     takes Norco prn  . Bruises easily     takes Plaquenil daily  . CORONARY ATHEROSCLEROSIS, ARTERY BYPASS GRAFT 08/23/2008  . CAD (coronary artery disease)     s/p CABG -  x 1 with LIMA to LAD  August 2009 /   . Urinary incontinence   . Bowel incontinence   . History of colon polyps   . Urinary frequency   . Urinary urgency   . Nocturia   . Anemia     takes folic acid bid  . Urinary incontinence     wears depends   Past Surgical History  Procedure Laterality Date  . Appendectomy    . Cesarean section  1968    x 1  . Hernia repair    . Coronary artery bypass graft  August 2009    CABG x 1 with LIMA to LAD /  . Ascending aortic aneurysm repair  August 2009     resection and grafting of a 5.2-cm ascending aortic arch aneurysm  . Joint replacement      2009 & 2010- both knees  . Back surgery      2011- cerv. fusion   . Breast surgery      biopsy x2- R breast  . Tonsillectomy      as a child  . Cardiac catheterization  2009  . Colonoscopy    .  Lumbar laminectomy/decompression microdiscectomy Bilateral 07/11/2013    Procedure: LUMBAR LAMINECTOMY/DECOMPRESSION MICRODISCECTOMY LUMBAR FIVE SACRAL ONE;  Surgeon: Mariam Dollar, MD;  Location: [facility];  Service: Neurosurgery;  Laterality: Bilateral;   Family History  Problem Relation Age of Onset  . Heart disease    . Coronary artery disease    . Anesthesia problems Neg Hx   . Hypotension Neg Hx   . Malignant hyperthermia Neg Hx   . Pseudochol deficiency Neg Hx   . Heart disease Mother   . Stroke Father   . Cancer Maternal Grandmother   . Stroke Paternal Grandmother    History  Substance Use Topics  . Smoking status: Never Smoker   . Smokeless tobacco: Not on file  . Alcohol Use: No   OB History    No data available     Review of Systems  Constitutional: Negative for fever, chills, diaphoresis and fatigue.  HENT: Negative for congestion, rhinorrhea and sneezing.   Eyes: Negative.   Respiratory: Negative for cough, chest tightness and shortness of breath.   Cardiovascular: Negative for chest pain and leg swelling.  Gastrointestinal: Negative for nausea, vomiting, abdominal pain, diarrhea and blood in stool.  Genitourinary: Negative for frequency, hematuria, flank pain and difficulty urinating.  Musculoskeletal: Negative for back pain, arthralgias and neck pain.  Skin: Positive for wound. Negative for rash.  Neurological: Negative for dizziness, speech difficulty, weakness, numbness and headaches.  All other systems reviewed and are negative.     Allergies  Amoxicillin; Cyclobenzaprine hcl; Mupirocin; Penicillins; and Tetanus toxoid  Home Medications   Prior to Admission medications   Medication Sig Start Date End Date Taking? Authorizing Provider  acetaminophen (TYLENOL) 500 MG tablet Take 500 mg by mouth every 6 (six) hours as needed (for pain.).    Historical Provider, MD  aspirin EC 81 MG tablet Take 81 mg by mouth at bedtime.     Historical Provider, MD   cetirizine (ZYRTEC) 10 MG tablet Take 10 mg by mouth daily as needed. Alternating with Hydroxyzine  For allergies    Historical Provider, MD  doxycycline (VIBRAMYCIN) 100 MG capsule Take 1 capsule (100 mg total) by mouth 2 (two) times daily. 11/02/14   Kristian Covey, MD  fluconazole (DIFLUCAN) 150 MG tablet Take 1 tablet (150 mg total) by mouth once. 11/13/14   Gordy Savers,  MD  folic acid (FOLVITE) 1 MG tablet Take 1 mg by mouth 2 (two) times daily.    Historical Provider, MD  furosemide (LASIX) 40 MG tablet Take 0.5-1 tablets (20-40 mg total) by mouth as needed. As needed for sweling 08/28/14   Gordy Savers, MD  hydrochlorothiazide (HYDRODIURIL) 25 MG tablet Take 25 mg by mouth daily.    Historical Provider, MD  hydrochlorothiazide (HYDRODIURIL) 25 MG tablet TAKE 1 TABLET BY MOUTH EVERY MORNING 11/14/14   Gordy Savers, MD  HYDROcodone-acetaminophen (NORCO/VICODIN) 5-325 MG per tablet Take 1 tablet by mouth every 6 (six) hours as needed for moderate pain. 11/13/14   Gordy Savers, MD  hydroxychloroquine (PLAQUENIL) 200 MG tablet Take 200 mg by mouth 2 (two) times daily.     Historical Provider, MD  hydrOXYzine (ATARAX/VISTARIL) 25 MG tablet Take 25 mg by mouth every 4 (four) hours as needed. For allergies alternate with Zyrtec    Historical Provider, MD  KLOR-CON M20 20 MEQ tablet TAKE 1 TABLET BY MOUTH TWICE A WEEK WITH FUROSEMIDE 09/18/14   Gordy Savers, MD  methotrexate (RHEUMATREX) 2.5 MG tablet Take 15 mg by mouth once a week. Caution:Chemotherapy. Protect from light.    Historical Provider, MD  metoprolol succinate (TOPROL-XL) 50 MG 24 hr tablet TAKE 1 TABLET BY MOUTH EVERY DAY 07/13/14   Gordy Savers, MD  simvastatin (ZOCOR) 40 MG tablet Take 20 mg by mouth at bedtime.  09/30/11   Gordy Savers, MD   Triage Vitals: BP 118/47 mmHg  Pulse 83  Temp(Src) 97.6 F (36.4 C) (Oral)  Resp 20  Ht 5\' 3"  (1.6 m)  Wt 154 lb (69.854 kg)  BMI 27.29  kg/m2  SpO2 100%   Physical Exam  Constitutional: She is oriented to person, place, and time. She appears well-developed and well-nourished.  HENT:  Head: Normocephalic.  1cm laceration to posterior scalp area  Eyes: Pupils are equal, round, and reactive to light.  Neck: Normal range of motion. Neck supple.  Cardiovascular: Normal rate, regular rhythm and normal heart sounds.   Pulmonary/Chest: Effort normal and breath sounds normal. No respiratory distress. She has no wheezes. She has no rales. She exhibits no tenderness.  Abdominal: Soft. Bowel sounds are normal. There is no tenderness. There is no rebound and no guarding.  Musculoskeletal: Normal range of motion. She exhibits no edema.  There is no pain along the cervical thoracic or lumbosacral spine. No pain on palpation or range of motion extremities  Lymphadenopathy:    She has no cervical adenopathy.  Neurological: She is alert and oriented to person, place, and time.  Skin: Skin is warm and dry. No rash noted.  Psychiatric: She has a normal mood and affect.    ED Course  LACERATION REPAIR Date/Time: 11/23/2014 10:39 PM Performed by: Malak Duchesneau Authorized by: 11/25/2014 Consent: Verbal consent obtained. Risks and benefits: risks, benefits and alternatives were discussed Consent given by: patient Body area: head/neck Location details: scalp Laceration length: 1 cm Foreign bodies: no foreign bodies Tendon involvement: none Nerve involvement: none Vascular damage: no Local anesthetic: LET (lido,epi,tetracaine) Patient sedated: no Irrigation solution: saline Irrigation method: syringe Amount of cleaning: standard Skin closure: staples Number of sutures: 2 Technique: simple Approximation: close Approximation difficulty: simple Patient tolerance: Patient tolerated the procedure well with no immediate complications   (including critical care time)  DIAGNOSTIC STUDIES: Oxygen Saturation is 100% on RA,  Normal by my interpretation.    COORDINATION OF CARE: 10:37  PM-Discussed treatment plan with pt at bedside and pt agreed to plan.     Labs Review Labs Reviewed - No data to display  Imaging Review Ct Head Wo Contrast  11/23/2014   CLINICAL DATA:  Lost balance, fell at home hitting back of head on walker. History of hypertension, hyperlipidemia and neuropathy.  EXAM: CT HEAD WITHOUT CONTRAST  TECHNIQUE: Contiguous axial images were obtained from the base of the skull through the vertex without intravenous contrast.  COMPARISON:  CT head July 05 2014  FINDINGS: The ventricles and sulci are normal for age. No intraparenchymal hemorrhage, mass effect nor midline shift. Patchy supratentorial white matter hypodensities are within normal range for patient's age and though non-specific suggest sequelae of chronic small vessel ischemic disease. No acute large vascular territory infarcts.  No abnormal extra-axial fluid collections. Basal cisterns are patent. Moderate calcific atherosclerosis of the carotid siphons.  Small RIGHT frontal scalp hematoma, no subcutaneous gas or radiopaque foreign bodies. No skull fracture. Mildly depressed remote RIGHT nasal bone fracture. The included ocular globes and orbital contents are non-suspicious. The mastoid aircells and included paranasal sinuses are well-aerated.  IMPRESSION: Small RIGHT frontal scalp hematoma.  No skull fracture.  No acute intracranial process; normal noncontrast CT of the head for age.   Electronically Signed   By: Awilda Metro   On: 11/23/2014 22:17     EKG Interpretation None      MDM   Final diagnoses:  Laceration of head, initial encounter    Pt allergic to TDAP.  Given wound care instructions an head injury precautions.  No other injuries identified.  I personally performed the services described in this documentation, which was scribed in my presence. The recorded information has been reviewed and is accurate.    Rolan Bucco, MD 11/23/14 [phone]

## 2014-11-23 NOTE — Discharge Instructions (Signed)
Head Injury You have received a head injury. It does not appear serious at this time. Headaches and vomiting are common following head injury. It should be easy to awaken from sleeping. Sometimes it is necessary for you to stay in the emergency department for a while for observation. Sometimes admission to the hospital may be needed. After injuries such as yours, most problems occur within the first 24 hours, but side effects may occur up to 7-10 days after the injury. It is important for you to carefully monitor your condition and contact your health care provider or seek immediate medical care if there is a change in your condition. WHAT ARE THE TYPES OF HEAD INJURIES? Head injuries can be as minor as a bump. Some head injuries can be more severe. More severe head injuries include:  A jarring injury to the brain (concussion).  A bruise of the brain (contusion). This mean there is bleeding in the brain that can cause swelling.  A cracked skull (skull fracture).  Bleeding in the brain that collects, clots, and forms a bump (hematoma). WHAT CAUSES A HEAD INJURY? A serious head injury is most likely to happen to someone who is in a car wreck and is not wearing a seat belt. Other causes of major head injuries include bicycle or motorcycle accidents, sports injuries, and falls. HOW ARE HEAD INJURIES DIAGNOSED? A complete history of the event leading to the injury and your current symptoms will be helpful in diagnosing head injuries. Many times, pictures of the brain, such as CT or MRI are needed to see the extent of the injury. Often, an overnight hospital stay is necessary for observation.  WHEN SHOULD I SEEK IMMEDIATE MEDICAL CARE?  You should get help right away if:  You have confusion or drowsiness.  You feel sick to your stomach (nauseous) or have continued, forceful vomiting.  You have dizziness or unsteadiness that is getting worse.  You have severe, continued headaches not relieved by  medicine. Only take over-the-counter or prescription medicines for pain, fever, or discomfort as directed by your health care provider.  You do not have normal function of the arms or legs or are unable to walk.  You notice changes in the black spots in the center of the colored part of your eye (pupil).  You have a clear or bloody fluid coming from your nose or ears.  You have a loss of vision. During the next 24 hours after the injury, you must stay with someone who can watch you for the warning signs. This person should contact local emergency services (911 in the [place] if you have seizures, you become unconscious, or you are unable to wake up. HOW CAN I PREVENT A HEAD INJURY IN THE FUTURE? The most important factor for preventing major head injuries is avoiding motor vehicle accidents. To minimize the potential for damage to your head, it is crucial to wear seat belts while riding in motor vehicles. Wearing helmets while bike riding and playing collision sports (like football) is also helpful. Also, avoiding dangerous activities around the house will further help reduce your risk of head injury.  WHEN CAN I RETURN TO NORMAL ACTIVITIES AND ATHLETICS? You should be reevaluated by your health care provider before returning to these activities. If you have any of the following symptoms, you should not return to activities or contact sports until 1 week after the symptoms have stopped:  Persistent headache.  Dizziness or vertigo.  Poor attention and concentration.  Confusion.  Memory problems.  Nausea or vomiting.  Fatigue or tire easily.  Irritability.  Intolerant of bright lights or loud noises.  Anxiety or depression.  Disturbed sleep. MAKE SURE YOU:   Understand these instructions.  Will watch your condition.  Will get help right away if you are not doing well or get worse. Document Released: 11/10/2005 Document Revised: 11/15/2013 Document Reviewed:  07/18/2013 [facility] Patient Information 2015 [place], [place]. This information is not intended to replace advice given to you by your health care provider. Make sure you discuss any questions you have with your health care provider.  Sutured Wound Care Sutures are stitches that can be used to close wounds. Wound care helps prevent pain and infection.  HOME CARE INSTRUCTIONS   Rest and elevate the injured area until all the pain and swelling are gone.  Only take over-the-counter or prescription medicines for pain, discomfort, or fever as directed by your caregiver.  After 48 hours, gently wash the area with mild soap and water once a day, or as directed. Rinse off the soap. Pat the area dry with a clean towel. Do not rub the wound. This may cause bleeding.  Follow your caregiver's instructions for how often to change the bandage (dressing). Stop using a dressing after 2 days or after the wound stops draining.  If the dressing sticks, moisten it with soapy water and gently remove it.  Apply ointment on the wound as directed.  Avoid stretching a sutured wound.  Drink enough fluids to keep your urine clear or pale yellow.  Follow up with your caregiver for suture removal as directed.  Use sunscreen on your wound for the next 3 to 6 months so the scar will not darken. SEEK IMMEDIATE MEDICAL CARE IF:   Your wound becomes red, swollen, hot, or tender.  You have increasing pain in the wound.  You have a red streak that extends from the wound.  There is pus coming from the wound.  You have a fever.  You have shaking chills.  There is a bad smell coming from the wound.  You have persistent bleeding from the wound. MAKE SURE YOU:   Understand these instructions.  Will watch your condition.  Will get help right away if you are not doing well or get worse. Document Released: 12/18/2004 Document Revised: 02/02/2012 Document Reviewed: 03/16/2011 [facility] Patient Information 2015  [place], [place]. This information is not intended to replace advice given to you by your health care provider. Make sure you discuss any questions you have with your health care provider.

## 2014-11-23 NOTE — ED Notes (Signed)
Fell at the wound center this afternoon. Hit the back of her head. Laceration and hematoma to the back of her head. Steri strip was applied by staff.

## 2014-11-27 ENCOUNTER — Ambulatory Visit: Payer: Medicare Other

## 2014-11-28 ENCOUNTER — Ambulatory Visit (INDEPENDENT_AMBULATORY_CARE_PROVIDER_SITE_OTHER): Payer: Medicare Other | Admitting: Neurology

## 2014-11-28 ENCOUNTER — Encounter: Payer: Self-pay | Admitting: Neurology

## 2014-11-28 VITALS — BP 175/66 | HR 74 | Wt 152.0 lb

## 2014-11-28 DIAGNOSIS — E538 Deficiency of other specified B group vitamins: Secondary | ICD-10-CM | POA: Diagnosis not present

## 2014-11-28 DIAGNOSIS — R2681 Unsteadiness on feet: Secondary | ICD-10-CM

## 2014-11-28 NOTE — Progress Notes (Signed)
Reason for visit: Gait disorder  [name] is a 81 y.o. female  History of present illness:  [name] is an 81 year old white female with a history of a chronic gait disorder. The patient indicates that she has had problems walking for greater than 10 years. She was followed by Dr. Sandria Manly in the past, but she does not recall that she was told any etiology for her walking problem. She has a history of rheumatoid arthritis, and she has had cervical spine surgery in the past at the C4-C6 level. The patient has had a recent MRI of the cervical spine that was done on 02/23/2014. This shows some spinal stenosis at the C2-3 and C3-4 levels without spinal cord impingement. The patient in the past has had EMG and nerve conduction studies that the patient claims did not show evidence of a peripheral neuropathy. She walks with a walker, but she has tends to lean backwards, and she will fall recently. If she tries to make a turn, she will oftentimes fall. She takes good stride, but she will scissor her legs with walking which results in a fall. She indicates that she falls anywhere from 1 to 5 times daily. She has a history of low back pain, and left-sided sciatica and some numbness down the left leg. She has had bilateral total knee replacements. She denies any neck discomfort. She has not had any problems with memory or confusion, or difficulty with her vision. She reports no headaches or dizziness. She has had some problems with double vision over the last year. She is sent to this office for further evaluation of the gait disorder. She notes occasional urinary urgency, no significant problems with urinary incontinence.  Past Medical History  Diagnosis Date  . DEPRESSION 05/21/2007  . FIBROCYSTIC BREAST DISEASE, HX OF 05/21/2007  . HYPERLIPIDEMIA-MIXED 11/26/2008    takes Simvastatin daily  . Irritable bowel syndrome 05/21/2007  . KNEE PAIN 09/16/2007  . LEG EDEMA, BILATERAL 05/21/2009  . OSTEOPENIA  05/21/2007  . URTICARIA, CHRONIC 05/21/2007  . History of aortic aneurysm     resection and grafting of a 5.2-cm ascending aortic arch aneurysm August 2009  . Dyslipidemia   . Vitamin D deficiency   . Urticaria   . Neuropathy     left radial  neuropathy with wrist drop  . Diverticulosis   . Irritable bowel syndrome   . GERD (gastroesophageal reflux disease)   . 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 Vaginal yeast infection     recently told to use monistat, hasn't started as of 01/26/2012  . HYPERTENSION 05/11/2008    takes Metoprolol and HCTZ daily  . Asthma     hx of;as a child  . Seasonal allergies     takes Hydroxyzine prn and Zyrtec 2 times a week  . Peripheral edema     takes Furosemide prn  . Pneumonia     history of;in the 90's  . Weakness     hands and feet  . Sciatica   . OSTEOARTHRITIS 01/20/2008    takes Methotrexate weekly  . DJD (degenerative joint disease)     history of   . Rheumatoid arthritis(714.0)   . Chronic back pain     takes Norco prn  . Bruises easily     takes Plaquenil daily  . CORONARY ATHEROSCLEROSIS, ARTERY BYPASS GRAFT 08/23/2008  . CAD (coronary artery disease)     s/p CABG -  x 1 with LIMA to LAD August 2009 /   . Urinary incontinence   . Bowel incontinence   . History of colon polyps   . Urinary frequency   . Urinary urgency   . Nocturia   . Anemia     takes folic acid bid  . Urinary incontinence     wears depends    Past Surgical History  Procedure Laterality Date  . Appendectomy    . Cesarean section  1968    x 1  . Hernia repair    . Coronary artery bypass graft  August 2009    CABG x 1 with LIMA to LAD /  . Ascending aortic aneurysm repair  August 2009     resection and grafting of a 5.2-cm ascending aortic arch aneurysm  . Joint replacement      2009 & 2010- both knees  . Back surgery      2011- cerv. fusion   . Breast surgery      biopsy x2- R breast  . Tonsillectomy      as a child  . Cardiac  catheterization  2009  . Colonoscopy    . Lumbar laminectomy/decompression microdiscectomy Bilateral 07/11/2013    Procedure: LUMBAR LAMINECTOMY/DECOMPRESSION MICRODISCECTOMY LUMBAR FIVE SACRAL ONE;  Surgeon: Mariam Dollar, MD;  Location: [facility];  Service: Neurosurgery;  Laterality: Bilateral;    Family History  Problem Relation Age of Onset  . Heart disease    . Coronary artery disease    . Anesthesia problems Neg Hx   . Hypotension Neg Hx   . Malignant hyperthermia Neg Hx   . Pseudochol deficiency Neg Hx   . Heart disease Mother   . Stroke Father   . Cancer Maternal Grandmother   . Stroke Paternal Grandmother     Social history:  reports that she has never smoked. She has never used smokeless tobacco. She reports that she does not drink alcohol or use illicit drugs.  Medications:  Current Outpatient Prescriptions on File Prior to Visit  Medication Sig Dispense Refill  . acetaminophen (TYLENOL) 500 MG tablet Take 500 mg by mouth every 6 (six) hours as needed (for pain.).    Marland Kitchen aspirin EC 81 MG tablet Take 81 mg by mouth at bedtime.     . cetirizine (ZYRTEC) 10 MG tablet Take 10 mg by mouth daily as needed. Alternating with Hydroxyzine  For allergies    . doxycycline (VIBRAMYCIN) 100 MG capsule Take 1 capsule (100 mg total) by mouth 2 (two) times daily. 20 capsule 0  . fluconazole (DIFLUCAN) 150 MG tablet Take 1 tablet (150 mg total) by mouth once. 1 tablet 0  . folic acid (FOLVITE) 1 MG tablet Take 1 mg by mouth 2 (two) times daily.    . furosemide (LASIX) 40 MG tablet Take 0.5-1 tablets (20-40 mg total) by mouth as needed. As needed for sweling 30 tablet 5  . hydrochlorothiazide (HYDRODIURIL) 25 MG tablet Take 25 mg by mouth daily.    Marland Kitchen HYDROcodone-acetaminophen (NORCO/VICODIN) 5-325 MG per tablet Take 1 tablet by mouth every 6 (six) hours as needed for moderate pain. 60 tablet 0  . hydroxychloroquine (PLAQUENIL) 200 MG tablet Take 200 mg by mouth 2 (two) times daily.     .  hydrOXYzine (ATARAX/VISTARIL) 25 MG tablet Take 25 mg by mouth as needed. For allergies alternate with Zyrtec    . methotrexate (RHEUMATREX) 2.5 MG tablet Take 15 mg by mouth once a week. Caution:Chemotherapy. Protect from  light.    . metoprolol succinate (TOPROL-XL) 50 MG 24 hr tablet TAKE 1 TABLET BY MOUTH EVERY DAY 30 tablet 4  . simvastatin (ZOCOR) 40 MG tablet Take 20 mg by mouth at bedtime.     Marland Kitchen KLOR-CON M20 20 MEQ tablet TAKE 1 TABLET BY MOUTH TWICE A WEEK WITH FUROSEMIDE (Patient not taking: Reported on 11/28/2014) 12 tablet 3   No current facility-administered medications on file prior to visit.      Allergies  Allergen Reactions  . Amoxicillin     REACTION: unspecified  . Cyclobenzaprine Hcl     REACTION: rash  . Mupirocin     Unknown reaction  . Penicillins Hives  . Tetanus Toxoid Hives    ROS:  Out of a complete 14 system review of symptoms, the patient complains only of the following symptoms, and all other reviewed systems are negative.  Double vision Gait disorder Back pain, leg pain  Blood pressure 175/66, pulse 74, weight 152 lb (68.947 kg).  Physical Exam  General: The patient is alert and cooperative at the time of the examination.  Eyes: Pupils are equal, round, and reactive to light. Discs are flat bilaterally.  Neck: The neck is supple, no carotid bruits are noted.  Respiratory: The respiratory examination is clear.  Cardiovascular: The cardiovascular examination reveals a regular rate and rhythm, no obvious murmurs or rubs are noted.  Skin: Extremities are with 3+ edema on the left, 2+ edema on the right leg below the knees.  Neurologic Exam  Mental status: The patient is alert and oriented x 3 at the time of the examination. The patient has apparent normal recent and remote memory, with an apparently normal attention span and concentration ability.  Cranial nerves: Facial symmetry is present. There is good sensation of the face to pinprick and  soft touch bilaterally. The strength of the facial muscles and the muscles to head turning and shoulder shrug are normal bilaterally. Speech is well enunciated, no aphasia or dysarthria is noted. Extraocular movements are full. Visual fields are full. The tongue is midline, and the patient has symmetric elevation of the soft palate. No obvious hearing deficits are noted.  Motor: The motor testing reveals 5 over 5 strength of all 4 extremities, with exception of some mild proximal weakness of the left leg. Good symmetric motor tone is noted throughout.  Sensory: Sensory testing is intact to pinprick, soft touch, vibration sensation, and position sense on all 4 extremities, with exception of some impairment of position sense in both feet. No evidence of extinction is noted.  Coordination: Cerebellar testing reveals good finger-nose-finger and heel-to-shin bilaterally.  Gait and station: Gait is really based, with a scissoring gait, good stride. The patient walks with a walker, still has significant gait instability, tendency to lean backwards with sitting or standing. Tandem gait was not attempted.  Reflexes: Deep tendon reflexes are symmetric, but are depressed bilaterally. Toes are downgoing bilaterally.   Assessment/Plan:  1. Gait disorder, progressive  2. Rheumatoid arthritis  3. Cervical and lumbosacral spine surgery  The patient has a severe gait disorder with a tendency to lean backwards with sitting or with standing, and a tendency to fall to one side or the next, even with a walker. The patient is falling frequently. She will be set up for some blood work today, and she will undergo MRI evaluation of the brain. I will need to review prior records from Dr. Sandria Manly. The patient will follow-up in 3-4 months. The current  gait pattern would be consistent with severe spasticity of the legs, but the patient does not appear to have hyperreflexia or increased motor tone in the legs.  Marlan Palau MD 11/28/2014 6:55 PM  [facility] [address] [place], [place] [address]  Phone [phone] Fax [phone]

## 2014-11-28 NOTE — Patient Instructions (Signed)

## 2014-11-29 LAB — SPECIMEN STATUS REPORT

## 2014-11-30 LAB — RPR: RPR: NONREACTIVE

## 2014-11-30 LAB — ANGIOTENSIN CONVERTING ENZYME: Angio Convert Enzyme: 55 U/L (ref 14–82)

## 2014-11-30 LAB — COPPER, SERUM: COPPER: 119 ug/dL (ref 72–166)

## 2014-11-30 LAB — VITAMIN B12: Vitamin B-12: 409 pg/mL (ref 211–946)

## 2014-12-01 ENCOUNTER — Encounter (HOSPITAL_BASED_OUTPATIENT_CLINIC_OR_DEPARTMENT_OTHER): Payer: Medicare Other | Attending: Internal Medicine

## 2014-12-01 DIAGNOSIS — L97921 Non-pressure chronic ulcer of unspecified part of left lower leg limited to breakdown of skin: Secondary | ICD-10-CM | POA: Insufficient documentation

## 2014-12-01 DIAGNOSIS — I87332 Chronic venous hypertension (idiopathic) with ulcer and inflammation of left lower extremity: Secondary | ICD-10-CM | POA: Insufficient documentation

## 2014-12-04 ENCOUNTER — Telehealth: Payer: Self-pay | Admitting: Internal Medicine

## 2014-12-04 NOTE — Telephone Encounter (Signed)
Pt has 2 staplers in back of head that needs to be removed ASAP. Can I create 30 min slot? Pt need removal this week

## 2014-12-04 NOTE — Telephone Encounter (Signed)
Yes

## 2014-12-05 NOTE — Telephone Encounter (Signed)
lmom for pt to cb to sch °

## 2014-12-05 NOTE — Telephone Encounter (Signed)
PT HAS BEEN SCH

## 2014-12-08 ENCOUNTER — Ambulatory Visit (INDEPENDENT_AMBULATORY_CARE_PROVIDER_SITE_OTHER): Payer: Medicare Other | Admitting: Internal Medicine

## 2014-12-08 ENCOUNTER — Other Ambulatory Visit: Payer: Self-pay | Admitting: Internal Medicine

## 2014-12-08 ENCOUNTER — Encounter: Payer: Self-pay | Admitting: Internal Medicine

## 2014-12-08 VITALS — BP 150/70 | HR 76 | Temp 97.6°F | Resp 20 | Ht 63.0 in | Wt 154.0 lb

## 2014-12-08 DIAGNOSIS — I2583 Coronary atherosclerosis due to lipid rich plaque: Principal | ICD-10-CM

## 2014-12-08 DIAGNOSIS — I2510 Atherosclerotic heart disease of native coronary artery without angina pectoris: Secondary | ICD-10-CM

## 2014-12-08 DIAGNOSIS — L97921 Non-pressure chronic ulcer of unspecified part of left lower leg limited to breakdown of skin: Secondary | ICD-10-CM | POA: Diagnosis not present

## 2014-12-08 DIAGNOSIS — I10 Essential (primary) hypertension: Secondary | ICD-10-CM

## 2014-12-08 DIAGNOSIS — R2681 Unsteadiness on feet: Secondary | ICD-10-CM

## 2014-12-08 DIAGNOSIS — I87332 Chronic venous hypertension (idiopathic) with ulcer and inflammation of left lower extremity: Secondary | ICD-10-CM | POA: Diagnosis not present

## 2014-12-08 NOTE — Progress Notes (Signed)
Pre visit review using our clinic review tool, if applicable. No additional management support is needed unless otherwise documented below in the visit note. 

## 2014-12-08 NOTE — Progress Notes (Signed)
Subjective:    Patient ID: [name], female    DOB: [birth year], 81 y.o.   MRN: [id number]  HPI  81 year old patient who is seen today in follow-up.  She has a history of gait instability with frequent falls.  She has fallen recently and has required staples of a scalp laceration.  She is seen here basically for removal of the staples She has treated hypertension. She is followed by neurology due to her gait instability and frequent falls.  She also is undergoing physical therapy. She has coronary artery disease which has been stable. She has significant osteoarthritis  Past Medical History  Diagnosis Date  . DEPRESSION 05/21/2007  . FIBROCYSTIC BREAST DISEASE, HX OF 05/21/2007  . HYPERLIPIDEMIA-MIXED 11/26/2008    takes Simvastatin daily  . Irritable bowel syndrome 05/21/2007  . KNEE PAIN 09/16/2007  . LEG EDEMA, BILATERAL 05/21/2009  . OSTEOPENIA 05/21/2007  . URTICARIA, CHRONIC 05/21/2007  . History of aortic aneurysm     resection and grafting of a 5.2-cm ascending aortic arch aneurysm August 2009  . Dyslipidemia   . Vitamin D deficiency   . Urticaria   . Neuropathy     left radial  neuropathy with wrist drop  . Diverticulosis   . Irritable bowel syndrome   . GERD (gastroesophageal reflux disease)   . 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 Vaginal yeast infection     recently told to use monistat, hasn't started as of 01/26/2012  . HYPERTENSION 05/11/2008    takes Metoprolol and HCTZ daily  . Asthma     hx of;as a child  . Seasonal allergies     takes Hydroxyzine prn and Zyrtec 2 times a week  . Peripheral edema     takes Furosemide prn  . Pneumonia     history of;in the 90's  . Weakness     hands and feet  . Sciatica   . OSTEOARTHRITIS 01/20/2008    takes Methotrexate weekly  . DJD (degenerative joint disease)     history of   . Rheumatoid arthritis(714.0)   . Chronic back pain     takes Norco prn  . Bruises easily     takes Plaquenil daily  .  CORONARY ATHEROSCLEROSIS, ARTERY BYPASS GRAFT 08/23/2008  . CAD (coronary artery disease)     s/p CABG -  x 1 with LIMA to LAD August 2009 /   . Urinary incontinence   . Bowel incontinence   . History of colon polyps   . Urinary frequency   . Urinary urgency   . Nocturia   . Anemia     takes folic acid bid  . Urinary incontinence     wears depends    History   Social History  . Marital Status: Widowed    Spouse Name: N/A    Number of Children: 2  . Years of Education: N/A   Occupational History  . retired    Social History Main Topics  . Smoking status: Never Smoker   . Smokeless tobacco: Never Used  . Alcohol Use: No  . Drug Use: No  . Sexual Activity: No   Other Topics Concern  . Not on file   Social History Narrative   Patient is right handed.   Patient drinks a little caffeine daily.    Past Surgical History  Procedure Laterality Date  . Appendectomy    . Cesarean section  1968    x  1  . Hernia repair    . Coronary artery bypass graft  August 2009    CABG x 1 with LIMA to LAD /  . Ascending aortic aneurysm repair  August 2009     resection and grafting of a 5.2-cm ascending aortic arch aneurysm  . Joint replacement      2009 & 2010- both knees  . Back surgery      2011- cerv. fusion   . Breast surgery      biopsy x2- R breast  . Tonsillectomy      as a child  . Cardiac catheterization  2009  . Colonoscopy    . Lumbar laminectomy/decompression microdiscectomy Bilateral 07/11/2013    Procedure: LUMBAR LAMINECTOMY/DECOMPRESSION MICRODISCECTOMY LUMBAR FIVE SACRAL ONE;  Surgeon: Mariam Dollar, MD;  Location: [facility];  Service: Neurosurgery;  Laterality: Bilateral;    Family History  Problem Relation Age of Onset  . Heart disease    . Coronary artery disease    . Anesthesia problems Neg Hx   . Hypotension Neg Hx   . Malignant hyperthermia Neg Hx   . Pseudochol deficiency Neg Hx   . Heart disease Mother   . Stroke Father   . Cancer Maternal  Grandmother   . Stroke Paternal Grandmother     Allergies  Allergen Reactions  . Amoxicillin     REACTION: unspecified  . Cyclobenzaprine Hcl     REACTION: rash  . Mupirocin     Unknown reaction  . Penicillins Hives  . Tetanus Toxoid Hives    Current Outpatient Prescriptions on File Prior to Visit  Medication Sig Dispense Refill  . acetaminophen (TYLENOL) 500 MG tablet Take 500 mg by mouth every 6 (six) hours as needed (for pain.).    Marland Kitchen aspirin EC 81 MG tablet Take 81 mg by mouth at bedtime.     . cetirizine (ZYRTEC) 10 MG tablet Take 10 mg by mouth daily as needed. Alternating with Hydroxyzine  For allergies    . doxycycline (VIBRAMYCIN) 100 MG capsule Take 1 capsule (100 mg total) by mouth 2 (two) times daily. 20 capsule 0  . fluconazole (DIFLUCAN) 150 MG tablet Take 1 tablet (150 mg total) by mouth once. 1 tablet 0  . folic acid (FOLVITE) 1 MG tablet Take 1 mg by mouth 2 (two) times daily.    . furosemide (LASIX) 40 MG tablet Take 0.5-1 tablets (20-40 mg total) by mouth as needed. As needed for sweling 30 tablet 5  . hydrochlorothiazide (HYDRODIURIL) 25 MG tablet Take 25 mg by mouth daily.    Marland Kitchen HYDROcodone-acetaminophen (NORCO/VICODIN) 5-325 MG per tablet Take 1 tablet by mouth every 6 (six) hours as needed for moderate pain. 60 tablet 0  . hydroxychloroquine (PLAQUENIL) 200 MG tablet Take 200 mg by mouth 2 (two) times daily.     . hydrOXYzine (ATARAX/VISTARIL) 25 MG tablet Take 25 mg by mouth as needed. For allergies alternate with Zyrtec    . KLOR-CON M20 20 MEQ tablet TAKE 1 TABLET BY MOUTH TWICE A WEEK WITH FUROSEMIDE 12 tablet 3  . methotrexate (RHEUMATREX) 2.5 MG tablet Take 15 mg by mouth once a week. Caution:Chemotherapy. Protect from light.    . simvastatin (ZOCOR) 40 MG tablet Take 20 mg by mouth at bedtime.      No current facility-administered medications on file prior to visit.    BP 150/70 mmHg  Pulse 76  Temp(Src) 97.6 F (36.4 C) (Oral)  Resp 20  Ht 5\' 3"   (  1.6 m)  Wt 154 lb (69.854 kg)  BMI 27.29 kg/m2  SpO2 98%     Review of Systems  Constitutional: Negative.   HENT: Negative for congestion, dental problem, hearing loss, rhinorrhea, sinus pressure, sore throat and tinnitus.   Eyes: Negative for pain, discharge and visual disturbance.  Respiratory: Negative for cough and shortness of breath.   Cardiovascular: Negative for chest pain, palpitations and leg swelling.  Gastrointestinal: Negative for nausea, vomiting, abdominal pain, diarrhea, constipation, blood in stool and abdominal distention.  Genitourinary: Negative for dysuria, urgency, frequency, hematuria, flank pain, vaginal bleeding, vaginal discharge, difficulty urinating, vaginal pain and pelvic pain.  Musculoskeletal: Positive for back pain, arthralgias and gait problem. Negative for joint swelling.  Skin: Negative for rash.  Neurological: Negative for dizziness, syncope, speech difficulty, weakness, numbness and headaches.  Hematological: Negative for adenopathy.  Psychiatric/Behavioral: Negative for behavioral problems, dysphoric mood and agitation. The patient is not nervous/anxious.        Objective:   Physical Exam  Constitutional: She appears well-developed and well-nourished. No distress.  Blood pressure 150/62  Skin:  2 staples are present involving the left occipital scalp area.  These were removed without difficulty          Assessment & Plan:   Staple removal Hypertension, controlled Unsteady gait.  Follow-up neurology Coronary artery disease, stable

## 2014-12-12 ENCOUNTER — Ambulatory Visit: Payer: Medicare Other

## 2014-12-14 DIAGNOSIS — L97921 Non-pressure chronic ulcer of unspecified part of left lower leg limited to breakdown of skin: Secondary | ICD-10-CM | POA: Diagnosis not present

## 2014-12-14 DIAGNOSIS — I87332 Chronic venous hypertension (idiopathic) with ulcer and inflammation of left lower extremity: Secondary | ICD-10-CM | POA: Diagnosis not present

## 2014-12-18 ENCOUNTER — Ambulatory Visit: Payer: Medicare Other | Admitting: Internal Medicine

## 2014-12-20 ENCOUNTER — Telehealth: Payer: Self-pay | Admitting: Internal Medicine

## 2014-12-20 NOTE — Telephone Encounter (Signed)
Patient is requesting re-fill on HYDROcodone-acetaminophen (NORCO/VICODIN) 5-325 MG per tablet. °

## 2014-12-21 MED ORDER — HYDROCODONE-ACETAMINOPHEN 5-325 MG PO TABS: 1.0000 | ORAL_TABLET | Freq: Four times a day (QID) | ORAL | Status: DC | PRN

## 2014-12-21 NOTE — Telephone Encounter (Signed)
Left detailed message Rx ready for pickup. Rx printed and signed. 

## 2014-12-22 DIAGNOSIS — L97921 Non-pressure chronic ulcer of unspecified part of left lower leg limited to breakdown of skin: Secondary | ICD-10-CM | POA: Diagnosis not present

## 2014-12-22 DIAGNOSIS — I87332 Chronic venous hypertension (idiopathic) with ulcer and inflammation of left lower extremity: Secondary | ICD-10-CM | POA: Diagnosis not present

## 2014-12-27 ENCOUNTER — Emergency Department (HOSPITAL_BASED_OUTPATIENT_CLINIC_OR_DEPARTMENT_OTHER): Payer: Medicare Other

## 2014-12-27 ENCOUNTER — Encounter (HOSPITAL_BASED_OUTPATIENT_CLINIC_OR_DEPARTMENT_OTHER): Payer: Self-pay | Admitting: *Deleted

## 2014-12-27 ENCOUNTER — Emergency Department (HOSPITAL_BASED_OUTPATIENT_CLINIC_OR_DEPARTMENT_OTHER)
Admission: EM | Admit: 2014-12-27 | Discharge: 2014-12-27 | Disposition: A | Discharge status: Home / Self Care | Payer: Medicare Other | Attending: Emergency Medicine | Admitting: Emergency Medicine

## 2014-12-27 DIAGNOSIS — M069 Rheumatoid arthritis, unspecified: Secondary | ICD-10-CM | POA: Diagnosis not present

## 2014-12-27 DIAGNOSIS — Z8669 Personal history of other diseases of the nervous system and sense organs: Secondary | ICD-10-CM | POA: Diagnosis not present

## 2014-12-27 DIAGNOSIS — Z951 Presence of aortocoronary bypass graft: Secondary | ICD-10-CM | POA: Diagnosis not present

## 2014-12-27 DIAGNOSIS — Y9289 Other specified places as the place of occurrence of the external cause: Secondary | ICD-10-CM | POA: Diagnosis not present

## 2014-12-27 DIAGNOSIS — E782 Mixed hyperlipidemia: Secondary | ICD-10-CM | POA: Insufficient documentation

## 2014-12-27 DIAGNOSIS — Y9389 Activity, other specified: Secondary | ICD-10-CM | POA: Diagnosis not present

## 2014-12-27 DIAGNOSIS — Y998 Other external cause status: Secondary | ICD-10-CM | POA: Insufficient documentation

## 2014-12-27 DIAGNOSIS — W19XXXA Unspecified fall, initial encounter: Secondary | ICD-10-CM

## 2014-12-27 DIAGNOSIS — Z86010 Personal history of colonic polyps: Secondary | ICD-10-CM | POA: Diagnosis not present

## 2014-12-27 DIAGNOSIS — S01111A Laceration without foreign body of right eyelid and periocular area, initial encounter: Secondary | ICD-10-CM | POA: Insufficient documentation

## 2014-12-27 DIAGNOSIS — Z880 Allergy status to penicillin: Secondary | ICD-10-CM | POA: Insufficient documentation

## 2014-12-27 DIAGNOSIS — Z792 Long term (current) use of antibiotics: Secondary | ICD-10-CM | POA: Diagnosis not present

## 2014-12-27 DIAGNOSIS — I10 Essential (primary) hypertension: Secondary | ICD-10-CM | POA: Insufficient documentation

## 2014-12-27 DIAGNOSIS — D649 Anemia, unspecified: Secondary | ICD-10-CM | POA: Insufficient documentation

## 2014-12-27 DIAGNOSIS — Z8701 Personal history of pneumonia (recurrent): Secondary | ICD-10-CM | POA: Diagnosis not present

## 2014-12-27 DIAGNOSIS — I2510 Atherosclerotic heart disease of native coronary artery without angina pectoris: Secondary | ICD-10-CM | POA: Insufficient documentation

## 2014-12-27 DIAGNOSIS — S61211A Laceration without foreign body of left index finger without damage to nail, initial encounter: Secondary | ICD-10-CM | POA: Diagnosis present

## 2014-12-27 DIAGNOSIS — W1839XA Other fall on same level, initial encounter: Secondary | ICD-10-CM | POA: Diagnosis not present

## 2014-12-27 DIAGNOSIS — Z9889 Other specified postprocedural states: Secondary | ICD-10-CM | POA: Diagnosis not present

## 2014-12-27 DIAGNOSIS — S61219A Laceration without foreign body of unspecified finger without damage to nail, initial encounter: Secondary | ICD-10-CM

## 2014-12-27 DIAGNOSIS — Z7982 Long term (current) use of aspirin: Secondary | ICD-10-CM | POA: Diagnosis not present

## 2014-12-27 DIAGNOSIS — Z872 Personal history of diseases of the skin and subcutaneous tissue: Secondary | ICD-10-CM | POA: Diagnosis not present

## 2014-12-27 DIAGNOSIS — Z8719 Personal history of other diseases of the digestive system: Secondary | ICD-10-CM | POA: Insufficient documentation

## 2014-12-27 DIAGNOSIS — Z79899 Other long term (current) drug therapy: Secondary | ICD-10-CM | POA: Insufficient documentation

## 2014-12-27 DIAGNOSIS — Z8619 Personal history of other infectious and parasitic diseases: Secondary | ICD-10-CM | POA: Insufficient documentation

## 2014-12-27 DIAGNOSIS — S0083XA Contusion of other part of head, initial encounter: Secondary | ICD-10-CM

## 2014-12-27 DIAGNOSIS — G8929 Other chronic pain: Secondary | ICD-10-CM | POA: Diagnosis not present

## 2014-12-27 DIAGNOSIS — J45909 Unspecified asthma, uncomplicated: Secondary | ICD-10-CM | POA: Insufficient documentation

## 2014-12-27 NOTE — ED Notes (Signed)
Pt c/o fall from standing on tile floor x 4 hrs ago , laceration to right eye brow and left index finger

## 2014-12-27 NOTE — Discharge Instructions (Signed)
Facial Laceration A facial laceration is a cut on the face. These injuries can be painful and cause bleeding. Some cuts may need to be closed with stitches (sutures), skin adhesive strips, or wound glue. Cuts usually heal quickly but can leave a scar. It can take 1-2 years for the scar to go away completely. HOME CARE   Only take medicines as told by your doctor.  Follow your doctor's instructions for wound care. For Stitches:  Keep the cut clean and dry.  If you have a bandage (dressing), change it at least once a day. Change the bandage if it gets wet or dirty, or as told by your doctor.  Wash the cut with soap and water 2 times a day. Rinse the cut with water. Pat it dry with a clean towel.  Put a thin layer of medicated cream on the cut as told by your doctor.  You may shower after the first 24 hours. Do not soak the cut in water until the stitches are removed.  Have your stitches removed as told by your doctor.  Do not wear any makeup until a few days after your stitches are removed. For Skin Adhesive Strips:  Keep the cut clean and dry.  Do not get the strips wet. You may take a bath, but be careful to keep the cut dry.  If the cut gets wet, pat it dry with a clean towel.  The strips will fall off on their own. Do not remove the strips that are still stuck to the cut. For Wound Glue:  You may shower or take baths. Do not soak or scrub the cut. Do not swim. Avoid heavy sweating until the glue falls off on its own. After a shower or bath, pat the cut dry with a clean towel.  Do not put medicine or makeup on your cut until the glue falls off.  If you have a bandage, do not put tape over the glue.  Avoid lots of sunlight or tanning lamps until the glue falls off.  The glue will fall off on its own in 5-10 days. Do not pick at the glue. After Healing: Put sunscreen on the cut for the first year to reduce your scar. GET HELP RIGHT AWAY IF:   Your cut area gets red,  painful, or puffy (swollen).  You see a yellowish-white fluid (pus) coming from the cut.  You have chills or a fever. MAKE SURE YOU:   Understand these instructions.  Will watch your condition.  Will get help right away if you are not doing well or get worse. Document Released: 04/28/2008 Document Revised: 08/31/2013 Document Reviewed: 06/23/2013 [facility] Patient Information 2015 [place], [place]. This information is not intended to replace advice given to you by your health care provider. Make sure you discuss any questions you have with your health care provider.  Stitches, Staples, or Skin Adhesive Strips  Stitches (sutures), staples, and skin adhesive strips hold the skin together as it heals. They will usually be in place for 7 days or less. HOME CARE  Wash your hands with soap and water before and after you touch your wound.  Only take medicine as told by your doctor.  Cover your wound only if your doctor told you to. Otherwise, leave it open to air.  Do not get your stitches wet or dirty. If they get dirty, dab them gently with a clean washcloth. Wet the washcloth with soapy water. Do not rub. Pat them dry gently.  Do not put medicine or medicated cream on your stitches unless your doctor told you to.  Do not take out your own stitches or staples. Skin adhesive strips will fall off by themselves.  Do not pick at the wound. Picking can cause an infection.  Do not miss your follow-up appointment.  If you have problems or questions, call your doctor. GET HELP RIGHT AWAY IF:   You have a temperature by mouth above 102 F (38.9 C), not controlled by medicine.  You have chills.  You have redness or pain around your stitches.  There is puffiness (swelling) around your stitches.  You notice fluid (drainage) from your stitches.  There is a bad smell coming from your wound. MAKE SURE YOU:  Understand these instructions.  Will watch your condition.  Will get help if  you are not doing well or get worse. Document Released: 09/07/2009 Document Revised: 02/02/2012 Document Reviewed: 09/07/2009 [facility] Patient Information 2015 [place], [place]. This information is not intended to replace advice given to you by your health care provider. Make sure you discuss any questions you have with your health care provider.

## 2014-12-27 NOTE — ED Provider Notes (Signed)
CSN: [id number]     Arrival date & time 12/27/14  1801 History   First MD Initiated Contact with Patient 12/27/14 1820     Chief Complaint  Patient presents with  . Fall     (Consider location/radiation/quality/duration/timing/severity/associated sxs/prior Treatment) HPI Comments: Pt comes in with c/o laceration to her left index finger and right eyebrow. Pt states that she fell while standing on the tile floor. Pt states that she is being worked up by neurology because she is constantly falling and they are not sure why yet. No loc. Pt was able to get up immediately after the incident. Pt is walking with her walker which is baseline. She is here because she couldn't get the bleeding to stop on the wounds.  The history is provided by the patient. No language interpreter was used.    Past Medical History  Diagnosis Date  . DEPRESSION 05/21/2007  . FIBROCYSTIC BREAST DISEASE, HX OF 05/21/2007  . HYPERLIPIDEMIA-MIXED 11/26/2008    takes Simvastatin daily  . Irritable bowel syndrome 05/21/2007  . KNEE PAIN 09/16/2007  . LEG EDEMA, BILATERAL 05/21/2009  . OSTEOPENIA 05/21/2007  . URTICARIA, CHRONIC 05/21/2007  . History of aortic aneurysm     resection and grafting of a 5.2-cm ascending aortic arch aneurysm August 2009  . Dyslipidemia   . Vitamin D deficiency   . Urticaria   . Neuropathy     left radial  neuropathy with wrist drop  . Diverticulosis   . Irritable bowel syndrome   . GERD (gastroesophageal reflux disease)   . 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 Vaginal yeast infection     recently told to use monistat, hasn't started as of 01/26/2012  . HYPERTENSION 05/11/2008    takes Metoprolol and HCTZ daily  . Asthma     hx of;as a child  . Seasonal allergies     takes Hydroxyzine prn and Zyrtec 2 times a week  . Peripheral edema     takes Furosemide prn  . Pneumonia     history of;in the 90's  . Weakness     hands and feet  . Sciatica   . OSTEOARTHRITIS 01/20/2008     takes Methotrexate weekly  . DJD (degenerative joint disease)     history of   . Rheumatoid arthritis(714.0)   . Chronic back pain     takes Norco prn  . Bruises easily     takes Plaquenil daily  . CORONARY ATHEROSCLEROSIS, ARTERY BYPASS GRAFT 08/23/2008  . CAD (coronary artery disease)     s/p CABG -  x 1 with LIMA to LAD August 2009 /   . Urinary incontinence   . Bowel incontinence   . History of colon polyps   . Urinary frequency   . Urinary urgency   . Nocturia   . Anemia     takes folic acid bid  . Urinary incontinence     wears depends   Past Surgical History  Procedure Laterality Date  . Appendectomy    . Cesarean section  1968    x 1  . Hernia repair    . Coronary artery bypass graft  August 2009    CABG x 1 with LIMA to LAD /  . Ascending aortic aneurysm repair  August 2009     resection and grafting of a 5.2-cm ascending aortic arch aneurysm  . Joint replacement      2009 & 2010- both knees  .  Back surgery      2011- cerv. fusion   . Breast surgery      biopsy x2- R breast  . Tonsillectomy      as a child  . Cardiac catheterization  2009  . Colonoscopy    . Lumbar laminectomy/decompression microdiscectomy Bilateral 07/11/2013    Procedure: LUMBAR LAMINECTOMY/DECOMPRESSION MICRODISCECTOMY LUMBAR FIVE SACRAL ONE;  Surgeon: Mariam Dollar, MD;  Location: [facility];  Service: Neurosurgery;  Laterality: Bilateral;   Family History  Problem Relation Age of Onset  . Heart disease    . Coronary artery disease    . Anesthesia problems Neg Hx   . Hypotension Neg Hx   . Malignant hyperthermia Neg Hx   . Pseudochol deficiency Neg Hx   . Heart disease Mother   . Stroke Father   . Cancer Maternal Grandmother   . Stroke Paternal Grandmother    History  Substance Use Topics  . Smoking status: Never Smoker   . Smokeless tobacco: Never Used  . Alcohol Use: No   OB History    No data available     Review of Systems  All other systems reviewed and are  negative.     Allergies  Amoxicillin; Cyclobenzaprine hcl; Mupirocin; Penicillins; and Tetanus toxoid  Home Medications   Prior to Admission medications   Medication Sig Start Date End Date Taking? Authorizing Provider  acetaminophen (TYLENOL) 500 MG tablet Take 500 mg by mouth every 6 (six) hours as needed (for pain.).    Historical Provider, MD  aspirin EC 81 MG tablet Take 81 mg by mouth at bedtime.     Historical Provider, MD  cetirizine (ZYRTEC) 10 MG tablet Take 10 mg by mouth daily as needed. Alternating with Hydroxyzine  For allergies    Historical Provider, MD  doxycycline (VIBRAMYCIN) 100 MG capsule Take 1 capsule (100 mg total) by mouth 2 (two) times daily. 11/02/14   Kristian Covey, MD  fluconazole (DIFLUCAN) 150 MG tablet Take 1 tablet (150 mg total) by mouth once. 11/13/14   Gordy Savers, MD  folic acid (FOLVITE) 1 MG tablet Take 1 mg by mouth 2 (two) times daily.    Historical Provider, MD  furosemide (LASIX) 40 MG tablet Take 0.5-1 tablets (20-40 mg total) by mouth as needed. As needed for sweling 08/28/14   Gordy Savers, MD  hydrochlorothiazide (HYDRODIURIL) 25 MG tablet Take 25 mg by mouth daily.    Historical Provider, MD  HYDROcodone-acetaminophen (NORCO/VICODIN) 5-325 MG per tablet Take 1 tablet by mouth every 6 (six) hours as needed for moderate pain. 12/21/14   Gordy Savers, MD  hydroxychloroquine (PLAQUENIL) 200 MG tablet Take 200 mg by mouth 2 (two) times daily.     Historical Provider, MD  hydrOXYzine (ATARAX/VISTARIL) 25 MG tablet Take 25 mg by mouth as needed. For allergies alternate with Zyrtec    Historical Provider, MD  KLOR-CON M20 20 MEQ tablet TAKE 1 TABLET BY MOUTH TWICE A WEEK WITH FUROSEMIDE 09/18/14   Gordy Savers, MD  methotrexate (RHEUMATREX) 2.5 MG tablet Take 15 mg by mouth once a week. Caution:Chemotherapy. Protect from light.    Historical Provider, MD  metoprolol succinate (TOPROL-XL) 50 MG 24 hr tablet TAKE 1 TABLET  BY MOUTH EVERY DAY 12/08/14   Gordy Savers, MD  simvastatin (ZOCOR) 40 MG tablet Take 20 mg by mouth at bedtime.  09/30/11   Gordy Savers, MD   BP 125/69 mmHg  Pulse 74  Temp(Src)  97.8 F (36.6 C)  Resp 18  Wt 154 lb (69.854 kg)  SpO2 100% Physical Exam  Constitutional: She is oriented to person, place, and time. She appears well-developed and well-nourished.  HENT:  Right Ear: External ear normal.  Left Ear: External ear normal.  Bruising and tenderness below the right eye  Eyes: Conjunctivae and EOM are normal. Pupils are equal, round, and reactive to light.  Neck: Normal range of motion. Neck supple.  Cardiovascular: Normal rate and regular rhythm.   Pulmonary/Chest: Effort normal and breath sounds normal.  Abdominal: Soft. Bowel sounds are normal. There is no tenderness.  Musculoskeletal:       Cervical back: Normal.       Thoracic back: Normal.       Lumbar back: Normal.  Bandaged left leg( unable to see wound as pt asked to not undress)  Neurological: She is alert and oriented to person, place, and time. She exhibits normal muscle tone. Coordination normal.  Skin:  Laceration to the dorsal aspect of the left finger. Laceration to the right eyebrow.  Nursing note and vitals reviewed.   ED Course  LACERATION REPAIR Date/Time: 12/27/2014 6:46 PM Performed by: Teressa Lower Authorized by: Teressa Lower Consent: Verbal consent obtained. Risks and benefits: risks, benefits and alternatives were discussed Consent given by: patient Patient identity confirmed: verbally with patient Time out: Immediately prior to procedure a "time out" was called to verify the correct patient, procedure, equipment, support staff and site/side marked as required. Body area: head/neck Location details: right eyebrow Laceration length: 2 cm Foreign bodies: no foreign bodies Irrigation solution: tap water Skin closure: glue Approximation: close Approximation difficulty:  simple Patient tolerance: Patient tolerated the procedure well with no immediate complications  LACERATION REPAIR Date/Time: 12/27/2014 6:47 PM Performed by: Teressa Lower Authorized by: Teressa Lower Consent: Verbal consent obtained. Risks and benefits: risks, benefits and alternatives were discussed Consent given by: patient Body area: upper extremity Location details: left index finger Laceration length: 2 cm Foreign bodies: no foreign bodies Irrigation solution: tap water Skin closure: glue Approximation: close Approximation difficulty: simple Patient tolerance: Patient tolerated the procedure well with no immediate complications   (including critical care time) Labs Review Labs Reviewed - No data to display  Imaging Review Ct Head Wo Contrast  12/27/2014   CLINICAL DATA:  81 year old female with history of trauma from a fall from a standing position tonight with laceration in the right eyebrow and bruising around the right eye. No associated loss of consciousness.  EXAM: CT HEAD WITHOUT CONTRAST  CT MAXILLOFACIAL WITHOUT CONTRAST  CT CERVICAL SPINE WITHOUT CONTRAST  TECHNIQUE: Multidetector CT imaging of the head, cervical spine, and maxillofacial structures were performed using the standard protocol without intravenous contrast. Multiplanar CT image reconstructions of the cervical spine and maxillofacial structures were also generated.  COMPARISON:  Head CT 11/23/2014.  FINDINGS: CT HEAD FINDINGS  Small amount of soft tissue swelling in the right frontal scalp. No acute displaced skull fractures are identified. Mild cerebral atrophy. Patchy and confluent areas of decreased attenuation are noted throughout the deep and periventricular white matter of the cerebral hemispheres bilaterally, compatible with mild chronic microvascular ischemic disease. No acute intracranial abnormality. Specifically, no evidence of acute post-traumatic intracranial hemorrhage, no definite regions of  acute/subacute cerebral ischemia, no focal mass, mass effect, hydrocephalus or abnormal intra or extra-axial fluid collections. The visualized paranasal sinuses and mastoids are well pneumatized.  CT MAXILLOFACIAL FINDINGS  Soft tissue swelling in the right frontal scalp above  the right orbit. Small amount of soft tissue swelling lateral to the right orbit. No acute displaced facial bone fractures. Specifically, pterygoid plates are intact. Mandibular condyles are located bilaterally. Bilateral globes and retro-orbital soft tissues are normal in appearance. No high attenuation fluid collections within the paranasal sinuses. Paranasal sinuses are well pneumatized.  CT CERVICAL SPINE FINDINGS  No acute displaced fractures of the cervical spine. Alignment is anatomic. Prevertebral soft tissues are normal. Status post ACDF at C4-C6 with interbody grafts at C4-C5 and C5-C6, with apparent complete bony fusion from C4 through C6. Multilevel degenerative disc disease, most pronounced at C6-C7. Moderate multilevel facet arthropathy with complete fusion of the facet joints at C3-C4 on the left side. Visualized portions of the upper thorax demonstrate some bilateral apical pleuroparenchymal thickening, presumably chronic post infectious or inflammatory scarring.  IMPRESSION: 1. Small amount of right frontal scalp swelling and periorbital soft tissue swelling lateral to the right orbit. No acute displaced facial bone fractures or skull fractures. No signs of significant acute intracranial trauma. 2. No evidence of significant acute traumatic injury to the cervical spine. 3. Mild cerebral atrophy and chronic white matter ischemic changes, typical for patient's age, as above. 4. Status post ACDF from C4-C6 with multilevel degenerative disc disease and cervical spondylosis, as above.   Electronically Signed   By: Trudie Reed M.D.   On: 12/27/2014 19:59   Ct Cervical Spine Wo Contrast  12/27/2014   CLINICAL DATA:   81 year old female with history of trauma from a fall from a standing position tonight with laceration in the right eyebrow and bruising around the right eye. No associated loss of consciousness.  EXAM: CT HEAD WITHOUT CONTRAST  CT MAXILLOFACIAL WITHOUT CONTRAST  CT CERVICAL SPINE WITHOUT CONTRAST  TECHNIQUE: Multidetector CT imaging of the head, cervical spine, and maxillofacial structures were performed using the standard protocol without intravenous contrast. Multiplanar CT image reconstructions of the cervical spine and maxillofacial structures were also generated.  COMPARISON:  Head CT 11/23/2014.  FINDINGS: CT HEAD FINDINGS  Small amount of soft tissue swelling in the right frontal scalp. No acute displaced skull fractures are identified. Mild cerebral atrophy. Patchy and confluent areas of decreased attenuation are noted throughout the deep and periventricular white matter of the cerebral hemispheres bilaterally, compatible with mild chronic microvascular ischemic disease. No acute intracranial abnormality. Specifically, no evidence of acute post-traumatic intracranial hemorrhage, no definite regions of acute/subacute cerebral ischemia, no focal mass, mass effect, hydrocephalus or abnormal intra or extra-axial fluid collections. The visualized paranasal sinuses and mastoids are well pneumatized.  CT MAXILLOFACIAL FINDINGS  Soft tissue swelling in the right frontal scalp above the right orbit. Small amount of soft tissue swelling lateral to the right orbit. No acute displaced facial bone fractures. Specifically, pterygoid plates are intact. Mandibular condyles are located bilaterally. Bilateral globes and retro-orbital soft tissues are normal in appearance. No high attenuation fluid collections within the paranasal sinuses. Paranasal sinuses are well pneumatized.  CT CERVICAL SPINE FINDINGS  No acute displaced fractures of the cervical spine. Alignment is anatomic. Prevertebral soft tissues are normal. Status  post ACDF at C4-C6 with interbody grafts at C4-C5 and C5-C6, with apparent complete bony fusion from C4 through C6. Multilevel degenerative disc disease, most pronounced at C6-C7. Moderate multilevel facet arthropathy with complete fusion of the facet joints at C3-C4 on the left side. Visualized portions of the upper thorax demonstrate some bilateral apical pleuroparenchymal thickening, presumably chronic post infectious or inflammatory scarring.  IMPRESSION: 1. Small amount of  right frontal scalp swelling and periorbital soft tissue swelling lateral to the right orbit. No acute displaced facial bone fractures or skull fractures. No signs of significant acute intracranial trauma. 2. No evidence of significant acute traumatic injury to the cervical spine. 3. Mild cerebral atrophy and chronic white matter ischemic changes, typical for patient's age, as above. 4. Status post ACDF from C4-C6 with multilevel degenerative disc disease and cervical spondylosis, as above.   Electronically Signed   By: Trudie Reed M.D.   On: 12/27/2014 19:59   Dg Finger Index Left  12/27/2014   CLINICAL DATA:  Status post fall; hit left index finger on object, with pain and laceration at the left second middle phalanx. Initial encounter.  EXAM: LEFT INDEX FINGER 2+V  COMPARISON:  None.  FINDINGS: There is no evidence of fracture or dislocation. The left second finger appears intact, aside from mild degenerative change at the second distal interphalangeal joint. Visualized joint spaces are otherwise preserved. The known soft tissue laceration is not well characterized on radiograph. No radiopaque foreign bodies are seen.  IMPRESSION: No evidence of fracture or dislocation.   Electronically Signed   By: Roanna Raider M.D.   On: 12/27/2014 19:57   Ct Maxillofacial Wo Cm  12/27/2014   CLINICAL DATA:  81 year old female with history of trauma from a fall from a standing position tonight with laceration in the right eyebrow and bruising  around the right eye. No associated loss of consciousness.  EXAM: CT HEAD WITHOUT CONTRAST  CT MAXILLOFACIAL WITHOUT CONTRAST  CT CERVICAL SPINE WITHOUT CONTRAST  TECHNIQUE: Multidetector CT imaging of the head, cervical spine, and maxillofacial structures were performed using the standard protocol without intravenous contrast. Multiplanar CT image reconstructions of the cervical spine and maxillofacial structures were also generated.  COMPARISON:  Head CT 11/23/2014.  FINDINGS: CT HEAD FINDINGS  Small amount of soft tissue swelling in the right frontal scalp. No acute displaced skull fractures are identified. Mild cerebral atrophy. Patchy and confluent areas of decreased attenuation are noted throughout the deep and periventricular white matter of the cerebral hemispheres bilaterally, compatible with mild chronic microvascular ischemic disease. No acute intracranial abnormality. Specifically, no evidence of acute post-traumatic intracranial hemorrhage, no definite regions of acute/subacute cerebral ischemia, no focal mass, mass effect, hydrocephalus or abnormal intra or extra-axial fluid collections. The visualized paranasal sinuses and mastoids are well pneumatized.  CT MAXILLOFACIAL FINDINGS  Soft tissue swelling in the right frontal scalp above the right orbit. Small amount of soft tissue swelling lateral to the right orbit. No acute displaced facial bone fractures. Specifically, pterygoid plates are intact. Mandibular condyles are located bilaterally. Bilateral globes and retro-orbital soft tissues are normal in appearance. No high attenuation fluid collections within the paranasal sinuses. Paranasal sinuses are well pneumatized.  CT CERVICAL SPINE FINDINGS  No acute displaced fractures of the cervical spine. Alignment is anatomic. Prevertebral soft tissues are normal. Status post ACDF at C4-C6 with interbody grafts at C4-C5 and C5-C6, with apparent complete bony fusion from C4 through C6. Multilevel  degenerative disc disease, most pronounced at C6-C7. Moderate multilevel facet arthropathy with complete fusion of the facet joints at C3-C4 on the left side. Visualized portions of the upper thorax demonstrate some bilateral apical pleuroparenchymal thickening, presumably chronic post infectious or inflammatory scarring.  IMPRESSION: 1. Small amount of right frontal scalp swelling and periorbital soft tissue swelling lateral to the right orbit. No acute displaced facial bone fractures or skull fractures. No signs of significant acute intracranial trauma.  2. No evidence of significant acute traumatic injury to the cervical spine. 3. Mild cerebral atrophy and chronic white matter ischemic changes, typical for patient's age, as above. 4. Status post ACDF from C4-C6 with multilevel degenerative disc disease and cervical spondylosis, as above.   Electronically Signed   By: Trudie Reed M.D.   On: 12/27/2014 19:59     EKG Interpretation None      MDM   Final diagnoses:  Fall  Finger laceration, initial encounter  Eyebrow laceration, right, initial encounter  Facial contusion, initial encounter    No acute bony injury noted. Wounds closed without any problem. Fall is ongoing problem. Discussed return precautions with pt    Teressa Lower, NP 12/27/14 2102  Ethelda Chick, MD 12/27/14 2108

## 2014-12-28 ENCOUNTER — Ambulatory Visit
Admission: RE | Admit: 2014-12-28 | Discharge: 2014-12-28 | Disposition: A | Discharge status: Home / Self Care | Payer: Medicare Other | Source: Ambulatory Visit | Attending: Neurology | Admitting: Neurology

## 2014-12-28 DIAGNOSIS — R2681 Unsteadiness on feet: Secondary | ICD-10-CM | POA: Diagnosis not present

## 2014-12-29 ENCOUNTER — Encounter (HOSPITAL_BASED_OUTPATIENT_CLINIC_OR_DEPARTMENT_OTHER): Payer: Medicare Other | Attending: Internal Medicine

## 2014-12-29 ENCOUNTER — Telehealth: Payer: Self-pay | Admitting: Neurology

## 2014-12-29 DIAGNOSIS — I87332 Chronic venous hypertension (idiopathic) with ulcer and inflammation of left lower extremity: Secondary | ICD-10-CM | POA: Insufficient documentation

## 2014-12-29 DIAGNOSIS — L97921 Non-pressure chronic ulcer of unspecified part of left lower leg limited to breakdown of skin: Secondary | ICD-10-CM | POA: Diagnosis not present

## 2014-12-29 NOTE — Telephone Encounter (Signed)
I called patient. The MRI the brain is essentially normal for age, nothing that would explain her significant walking problems. We may need to consider MRI evaluation of the cervical spine, she had a recent CT scan of the cervical spine that did not show any significant structural changes of the neck.   MRI brain 12/28/14:  IMPRESSION: This is a mildly abnormal MRI of the brain for this patient's age showing hyperintense foci in the hemispheres most consistent with age-related small vessel ischemic change. Brain volume and cerebellar volume are normal for age. There are no acute findings.

## 2015-01-04 DIAGNOSIS — L97921 Non-pressure chronic ulcer of unspecified part of left lower leg limited to breakdown of skin: Secondary | ICD-10-CM | POA: Diagnosis not present

## 2015-01-04 DIAGNOSIS — I87332 Chronic venous hypertension (idiopathic) with ulcer and inflammation of left lower extremity: Secondary | ICD-10-CM | POA: Diagnosis not present

## 2015-01-11 DIAGNOSIS — I87332 Chronic venous hypertension (idiopathic) with ulcer and inflammation of left lower extremity: Secondary | ICD-10-CM | POA: Diagnosis not present

## 2015-01-11 DIAGNOSIS — L97921 Non-pressure chronic ulcer of unspecified part of left lower leg limited to breakdown of skin: Secondary | ICD-10-CM | POA: Diagnosis not present

## 2015-01-14 NOTE — Progress Notes (Signed)
This encounter was created in error - please disregard.

## 2015-01-15 ENCOUNTER — Encounter: Payer: Medicare Other | Admitting: Internal Medicine

## 2015-01-17 ENCOUNTER — Telehealth: Payer: Self-pay | Admitting: Internal Medicine

## 2015-01-17 NOTE — Telephone Encounter (Signed)
Patient need re-fill on HYDROcodone-acetaminophen (NORCO/VICODIN) 5-325 MG per tablet

## 2015-01-18 MED ORDER — HYDROCODONE-ACETAMINOPHEN 5-325 MG PO TABS: 1.0000 | ORAL_TABLET | Freq: Four times a day (QID) | ORAL | Status: DC | PRN

## 2015-01-18 NOTE — Telephone Encounter (Signed)
Left detailed message Rx ready for pickup, will be at the front desk. Rx printed and signed. 

## 2015-01-19 DIAGNOSIS — L97921 Non-pressure chronic ulcer of unspecified part of left lower leg limited to breakdown of skin: Secondary | ICD-10-CM | POA: Diagnosis not present

## 2015-01-19 DIAGNOSIS — I87332 Chronic venous hypertension (idiopathic) with ulcer and inflammation of left lower extremity: Secondary | ICD-10-CM | POA: Diagnosis not present

## 2015-02-21 ENCOUNTER — Other Ambulatory Visit: Payer: Self-pay | Admitting: Internal Medicine

## 2015-02-22 ENCOUNTER — Telehealth: Payer: Self-pay | Admitting: Internal Medicine

## 2015-02-22 LAB — HM DIABETES EYE EXAM

## 2015-02-22 NOTE — Telephone Encounter (Signed)
Patient would like a re-fill on HYDROcodone-acetaminophen (NORCO/VICODIN) 5-325 MG per tablet. Ok to leave message on voicemail. She has 2 days left.

## 2015-02-23 MED ORDER — HYDROCODONE-ACETAMINOPHEN 5-325 MG PO TABS
1.0000 | ORAL_TABLET | Freq: Four times a day (QID) | ORAL | Status: DC | PRN
Start: 2015-02-23 — End: 2015-03-22

## 2015-02-23 NOTE — Telephone Encounter (Signed)
Left detailed message on home and mobile Rx ready for pickup. Rx printed and signed.

## 2015-03-14 ENCOUNTER — Encounter: Payer: Self-pay | Admitting: Internal Medicine

## 2015-03-18 ENCOUNTER — Emergency Department (HOSPITAL_COMMUNITY): Payer: Medicare Other

## 2015-03-18 ENCOUNTER — Emergency Department (HOSPITAL_COMMUNITY)
Admission: EM | Admit: 2015-03-18 | Discharge: 2015-03-18 | Disposition: A | Discharge status: Home / Self Care | Payer: Medicare Other | Attending: Emergency Medicine | Admitting: Emergency Medicine

## 2015-03-18 ENCOUNTER — Encounter (HOSPITAL_COMMUNITY): Payer: Self-pay | Admitting: Emergency Medicine

## 2015-03-18 DIAGNOSIS — Z79899 Other long term (current) drug therapy: Secondary | ICD-10-CM | POA: Diagnosis not present

## 2015-03-18 DIAGNOSIS — Y92121 Bathroom in nursing home as the place of occurrence of the external cause: Secondary | ICD-10-CM | POA: Insufficient documentation

## 2015-03-18 DIAGNOSIS — Z86018 Personal history of other benign neoplasm: Secondary | ICD-10-CM | POA: Insufficient documentation

## 2015-03-18 DIAGNOSIS — S0101XA Laceration without foreign body of scalp, initial encounter: Secondary | ICD-10-CM | POA: Diagnosis not present

## 2015-03-18 DIAGNOSIS — Z872 Personal history of diseases of the skin and subcutaneous tissue: Secondary | ICD-10-CM | POA: Insufficient documentation

## 2015-03-18 DIAGNOSIS — E782 Mixed hyperlipidemia: Secondary | ICD-10-CM | POA: Insufficient documentation

## 2015-03-18 DIAGNOSIS — Z7982 Long term (current) use of aspirin: Secondary | ICD-10-CM | POA: Diagnosis not present

## 2015-03-18 DIAGNOSIS — Z8659 Personal history of other mental and behavioral disorders: Secondary | ICD-10-CM | POA: Insufficient documentation

## 2015-03-18 DIAGNOSIS — G8929 Other chronic pain: Secondary | ICD-10-CM | POA: Insufficient documentation

## 2015-03-18 DIAGNOSIS — W01198A Fall on same level from slipping, tripping and stumbling with subsequent striking against other object, initial encounter: Secondary | ICD-10-CM | POA: Diagnosis not present

## 2015-03-18 DIAGNOSIS — J45909 Unspecified asthma, uncomplicated: Secondary | ICD-10-CM | POA: Diagnosis not present

## 2015-03-18 DIAGNOSIS — Z9889 Other specified postprocedural states: Secondary | ICD-10-CM | POA: Diagnosis not present

## 2015-03-18 DIAGNOSIS — I10 Essential (primary) hypertension: Secondary | ICD-10-CM | POA: Insufficient documentation

## 2015-03-18 DIAGNOSIS — M069 Rheumatoid arthritis, unspecified: Secondary | ICD-10-CM | POA: Insufficient documentation

## 2015-03-18 DIAGNOSIS — K589 Irritable bowel syndrome without diarrhea: Secondary | ICD-10-CM | POA: Insufficient documentation

## 2015-03-18 DIAGNOSIS — M1990 Unspecified osteoarthritis, unspecified site: Secondary | ICD-10-CM | POA: Insufficient documentation

## 2015-03-18 DIAGNOSIS — Z8701 Personal history of pneumonia (recurrent): Secondary | ICD-10-CM | POA: Diagnosis not present

## 2015-03-18 DIAGNOSIS — I2510 Atherosclerotic heart disease of native coronary artery without angina pectoris: Secondary | ICD-10-CM | POA: Insufficient documentation

## 2015-03-18 DIAGNOSIS — S0990XA Unspecified injury of head, initial encounter: Secondary | ICD-10-CM | POA: Diagnosis present

## 2015-03-18 DIAGNOSIS — Z951 Presence of aortocoronary bypass graft: Secondary | ICD-10-CM | POA: Insufficient documentation

## 2015-03-18 DIAGNOSIS — D649 Anemia, unspecified: Secondary | ICD-10-CM | POA: Diagnosis not present

## 2015-03-18 DIAGNOSIS — Y998 Other external cause status: Secondary | ICD-10-CM | POA: Insufficient documentation

## 2015-03-18 DIAGNOSIS — Z86010 Personal history of colonic polyps: Secondary | ICD-10-CM | POA: Diagnosis not present

## 2015-03-18 DIAGNOSIS — Y9389 Activity, other specified: Secondary | ICD-10-CM | POA: Diagnosis not present

## 2015-03-18 DIAGNOSIS — W19XXXA Unspecified fall, initial encounter: Secondary | ICD-10-CM

## 2015-03-18 DIAGNOSIS — Z880 Allergy status to penicillin: Secondary | ICD-10-CM | POA: Diagnosis not present

## 2015-03-18 MED ORDER — LIDOCAINE HCL (PF) 1 % IJ SOLN
5.0000 mL | Freq: Once | INTRAMUSCULAR | Status: AC
  Administered 2015-03-18: 5 mL via INTRADERMAL
  Filled 2015-03-18: qty 5

## 2015-03-18 NOTE — Discharge Instructions (Signed)
As discussed, with your frequent falls, it is important to work with your physician to find a means to reduce frequency of trauma.  Please monitor your condition carefully, and do not hesitate to return here if you develop new, or concerning changes in your condition.  Your sutures should be removed in one week.

## 2015-03-18 NOTE — ED Provider Notes (Signed)
CSN: [id number]     Arrival date & time 03/18/15  1646 History   First MD Initiated Contact with Patient 03/18/15 1657     Chief Complaint  Patient presents with  . Fall  . Head Injury    HPI  Patient presents after mechanical fall. Patient has a history of frequent falls, which she acknowledges is due to disequilibrium, has been a chronic condition. Today the patient had a typical fall, slipping, falling backwards. She struck her head, did not lose consciousness. It took her several hours of trying to clean herself up, before she called for assistance. She denies any loss of consciousness, weakness, visual changes, asymmetric dysesthesia throughout that passage of time. EMS reports the patient was awake and alert, hemodynamically stable on their arrival, and throughout transport. Patient does not take blood thinning medication.   Past Medical History  Diagnosis Date  . DEPRESSION 05/21/2007  . FIBROCYSTIC BREAST DISEASE, HX OF 05/21/2007  . HYPERLIPIDEMIA-MIXED 11/26/2008    takes Simvastatin daily  . Irritable bowel syndrome 05/21/2007  . KNEE PAIN 09/16/2007  . LEG EDEMA, BILATERAL 05/21/2009  . OSTEOPENIA 05/21/2007  . URTICARIA, CHRONIC 05/21/2007  . History of aortic aneurysm     resection and grafting of a 5.2-cm ascending aortic arch aneurysm August 2009  . Dyslipidemia   . Vitamin D deficiency   . Urticaria   . Neuropathy     left radial  neuropathy with wrist drop  . Diverticulosis   . Irritable bowel syndrome   . GERD (gastroesophageal reflux disease)   . 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 Vaginal yeast infection     recently told to use monistat, hasn't started as of 01/26/2012  . HYPERTENSION 05/11/2008    takes Metoprolol and HCTZ daily  . Asthma     hx of;as a child  . Seasonal allergies     takes Hydroxyzine prn and Zyrtec 2 times a week  . Peripheral edema     takes Furosemide prn  . Pneumonia     history of;in the 90's  . Weakness    hands and feet  . Sciatica   . OSTEOARTHRITIS 01/20/2008    takes Methotrexate weekly  . DJD (degenerative joint disease)     history of   . Rheumatoid arthritis(714.0)   . Chronic back pain     takes Norco prn  . Bruises easily     takes Plaquenil daily  . CORONARY ATHEROSCLEROSIS, ARTERY BYPASS GRAFT 08/23/2008  . CAD (coronary artery disease)     s/p CABG -  x 1 with LIMA to LAD August 2009 /   . Urinary incontinence   . Bowel incontinence   . History of colon polyps   . Urinary frequency   . Urinary urgency   . Nocturia   . Anemia     takes folic acid bid  . Urinary incontinence     wears depends   Past Surgical History  Procedure Laterality Date  . Appendectomy    . Cesarean section  1968    x 1  . Hernia repair    . Coronary artery bypass graft  August 2009    CABG x 1 with LIMA to LAD /  . Ascending aortic aneurysm repair  August 2009     resection and grafting of a 5.2-cm ascending aortic arch aneurysm  . Joint replacement      2009 & 2010- both knees  . Back  surgery      2011- cerv. fusion   . Breast surgery      biopsy x2- R breast  . Tonsillectomy      as a child  . Cardiac catheterization  2009  . Colonoscopy    . Lumbar laminectomy/decompression microdiscectomy Bilateral 07/11/2013    Procedure: LUMBAR LAMINECTOMY/DECOMPRESSION MICRODISCECTOMY LUMBAR FIVE SACRAL ONE;  Surgeon: Mariam Dollar, MD;  Location: [facility];  Service: Neurosurgery;  Laterality: Bilateral;   Family History  Problem Relation Age of Onset  . Heart disease    . Coronary artery disease    . Anesthesia problems Neg Hx   . Hypotension Neg Hx   . Malignant hyperthermia Neg Hx   . Pseudochol deficiency Neg Hx   . Heart disease Mother   . Stroke Father   . Cancer Maternal Grandmother   . Stroke Paternal Grandmother    History  Substance Use Topics  . Smoking status: Never Smoker   . Smokeless tobacco: Never Used  . Alcohol Use: No   OB History    No data available      Review of Systems  Constitutional:       Per HPI, otherwise negative  HENT:       Per HPI, otherwise negative  Respiratory:       Per HPI, otherwise negative  Cardiovascular:       Per HPI, otherwise negative  Gastrointestinal: Negative for vomiting.  Endocrine:       Negative aside from HPI  Genitourinary:       Neg aside from HPI   Musculoskeletal:       Per HPI, otherwise negative  Skin: Negative.   Neurological: Negative for dizziness, syncope, weakness, light-headedness and headaches.      Allergies  Amoxicillin; Cyclobenzaprine hcl; Mupirocin; Penicillins; and Tetanus toxoid  Home Medications   Prior to Admission medications   Medication Sig Start Date End Date Taking? Authorizing Provider  acetaminophen (TYLENOL) 500 MG tablet Take 500 mg by mouth every 6 (six) hours as needed (for pain.).    Historical Provider, MD  aspirin EC 81 MG tablet Take 81 mg by mouth at bedtime.     Historical Provider, MD  cetirizine (ZYRTEC) 10 MG tablet Take 10 mg by mouth daily as needed. Alternating with Hydroxyzine  For allergies    Historical Provider, MD  doxycycline (VIBRAMYCIN) 100 MG capsule Take 1 capsule (100 mg total) by mouth 2 (two) times daily. 11/02/14   Kristian Covey, MD  fluconazole (DIFLUCAN) 150 MG tablet Take 1 tablet (150 mg total) by mouth once. 11/13/14   Gordy Savers, MD  folic acid (FOLVITE) 1 MG tablet Take 1 mg by mouth 2 (two) times daily.    Historical Provider, MD  furosemide (LASIX) 40 MG tablet Take 0.5-1 tablets (20-40 mg total) by mouth as needed. As needed for sweling 08/28/14   Gordy Savers, MD  hydrochlorothiazide (HYDRODIURIL) 25 MG tablet Take 25 mg by mouth daily.    Historical Provider, MD  HYDROcodone-acetaminophen (NORCO/VICODIN) 5-325 MG per tablet Take 1 tablet by mouth every 6 (six) hours as needed for moderate pain. 02/23/15   Gordy Savers, MD  hydroxychloroquine (PLAQUENIL) 200 MG tablet Take 200 mg by mouth 2 (two)  times daily.     Historical Provider, MD  hydrOXYzine (ATARAX/VISTARIL) 25 MG tablet Take 25 mg by mouth as needed. For allergies alternate with Zyrtec    Historical Provider, MD  KLOR-CON M20 20 MEQ  tablet TAKE 1 TABLET BY MOUTH TWICE A WEEK WITH FUROSEMIDE 09/18/14   Gordy Savers, MD  methotrexate (RHEUMATREX) 2.5 MG tablet Take 15 mg by mouth once a week. Caution:Chemotherapy. Protect from light.    Historical Provider, MD  metoprolol succinate (TOPROL-XL) 50 MG 24 hr tablet TAKE 1 TABLET BY MOUTH EVERY DAY 12/08/14   Gordy Savers, MD  simvastatin (ZOCOR) 40 MG tablet TAKE 1 TABLET BY MOUTH AT BEDTIME 02/22/15   Gordy Savers, MD   BP 140/51 mmHg  Pulse 73  Temp(Src) 97.7 F (36.5 C) (Oral)  Resp 20  SpO2 100% Physical Exam  Constitutional: She is oriented to person, place, and time. She appears well-developed and well-nourished. No distress.  HENT:  Head: Normocephalic.    Eyes: Conjunctivae and EOM are normal.  Neck: No spinous process tenderness and no muscular tenderness present. No rigidity. No edema, no erythema and normal range of motion present.  Cardiovascular: Normal rate and regular rhythm.   Pulmonary/Chest: Effort normal and breath sounds normal. No stridor. No respiratory distress.  Abdominal: She exhibits no distension.  Musculoskeletal: She exhibits no edema.  Neurological: She is alert and oriented to person, place, and time. No cranial nerve deficit. She exhibits normal muscle tone. Coordination normal.  Skin: Skin is warm and dry.  Psychiatric: She has a normal mood and affect.  Nursing note and vitals reviewed.   ED Course  Procedures (including critical care time) LACERATION REPAIR Performed by: Gerhard Munch Authorized by: Gerhard Munch Consent: Verbal consent obtained. Risks and benefits: risks, benefits and alternatives were discussed Consent given by: patient Patient identity confirmed: provided demographic data Prepped and  Draped in normal sterile fashion Wound explored  Laceration Location: R posterior scalp  Laceration Length: 4cm  No Foreign Bodies seen or palpated  Anesthesia: local infiltration  Local anesthetic: lidocaine 1% no epinephrine  Anesthetic total:  27ml  Irrigation method: syringe Amount of cleaning: standard  Skin closure: 4-0 sutures  Number of sutures: 4  Technique: close as possible  Patient tolerance: Patient tolerated the procedure well with no immediate complications.   MDM   Final diagnoses:  Fall, initial encounter  Laceration of scalp, initial encounter   elderly female presents after a fall. No evidence for intracranial hemorrhage, neurovascular compromise. Patient does have actively bleeding wound on the posterior scalp. Repaired with 40 sutures. Wound repair was done without complication, well tolerated. Patient discharged in stable condition.  Gerhard Munch, MD 03/18/15 2049

## 2015-03-18 NOTE — ED Notes (Signed)
Pt from home via [facility] with c/o fall at approx 2 pm this afternoon.  Pt got out of shower, was cleaning around the toilet and slipped falling backwards.  Pt adamantly denies LOC, lives alone.  EMS reports minimum of 250 mL blood loss, denies pain, squirting blood noted to right occipital region of head, slowed with compression dressing.  Pt A&Ox4.

## 2015-03-21 ENCOUNTER — Telehealth: Payer: Self-pay | Admitting: Internal Medicine

## 2015-03-21 NOTE — Telephone Encounter (Addendum)
Pt needs new rx hydrocodone by friday °

## 2015-03-22 MED ORDER — HYDROCODONE-ACETAMINOPHEN 5-325 MG PO TABS: 1.0000 | ORAL_TABLET | Freq: Four times a day (QID) | ORAL | Status: DC | PRN

## 2015-03-22 NOTE — Telephone Encounter (Signed)
Pt notified Rx ready for pickup. Rx printed and signed by Dr. Todd.  

## 2015-03-29 ENCOUNTER — Encounter: Payer: Self-pay | Admitting: Internal Medicine

## 2015-03-29 ENCOUNTER — Ambulatory Visit (INDEPENDENT_AMBULATORY_CARE_PROVIDER_SITE_OTHER): Payer: Medicare Other | Admitting: Internal Medicine

## 2015-03-29 VITALS — BP 130/72 | HR 88 | Temp 97.7°F | Resp 20 | Ht 63.0 in | Wt 155.0 lb

## 2015-03-29 DIAGNOSIS — M150 Primary generalized (osteo)arthritis: Secondary | ICD-10-CM

## 2015-03-29 DIAGNOSIS — R2681 Unsteadiness on feet: Secondary | ICD-10-CM

## 2015-03-29 DIAGNOSIS — Z4802 Encounter for removal of sutures: Secondary | ICD-10-CM | POA: Diagnosis not present

## 2015-03-29 DIAGNOSIS — M159 Polyosteoarthritis, unspecified: Secondary | ICD-10-CM

## 2015-03-29 NOTE — Progress Notes (Signed)
Pre visit review using our clinic review tool, if applicable. No additional management support is needed unless otherwise documented below in the visit note. 

## 2015-03-29 NOTE — Progress Notes (Signed)
   Subjective:    Patient ID: [name], female    DOB: 28-Jan-1932, 81 y.o.   MRN: [id number]  HPI Suture removal   Review of Systems  Constitutional: Negative.   HENT: Negative for congestion, dental problem, hearing loss, rhinorrhea, sinus pressure, sore throat and tinnitus.   Eyes: Negative for pain, discharge and visual disturbance.  Respiratory: Negative for cough and shortness of breath.   Cardiovascular: Negative for chest pain, palpitations and leg swelling.  Gastrointestinal: Negative for nausea, vomiting, abdominal pain, diarrhea, constipation, blood in stool and abdominal distention.  Genitourinary: Negative for dysuria, urgency, frequency, hematuria, flank pain, vaginal bleeding, vaginal discharge, difficulty urinating, vaginal pain and pelvic pain.  Musculoskeletal: Positive for arthralgias and gait problem. Negative for joint swelling.  Skin: Negative for rash.  Neurological: Negative for dizziness, syncope, speech difficulty, weakness, numbness and headaches.  Hematological: Negative for adenopathy.  Psychiatric/Behavioral: Negative for behavioral problems, dysphoric mood and agitation. The patient is not nervous/anxious.        Objective:   Physical Exam  Constitutional: She appears well-developed and well-nourished. No distress.  Very unsteady gait Walks with a walker Tends to fall backwards  Skin:  4.  Sutures are removed from scalp laceration right occipital region          Assessment & Plan:   Suture removal Scalp laceration Unsteady gait.  Follow-up neurology  Return here for routine exam as scheduled next month

## 2015-04-25 ENCOUNTER — Ambulatory Visit (INDEPENDENT_AMBULATORY_CARE_PROVIDER_SITE_OTHER): Payer: Medicare Other | Admitting: Neurology

## 2015-04-25 ENCOUNTER — Encounter: Payer: Self-pay | Admitting: Neurology

## 2015-04-25 VITALS — BP 148/56 | HR 70 | Ht 64.0 in

## 2015-04-25 DIAGNOSIS — R2681 Unsteadiness on feet: Secondary | ICD-10-CM | POA: Diagnosis not present

## 2015-04-25 NOTE — Progress Notes (Signed)
Reason for visit: Gait disorder  [name] is an 81 y.o. female  History of present illness:  [name] is an 81 year old right-handed white female with a history of a severe gait disorder. The patient has had prior lumbosacral spine surgery in 2014 at the L5-S1 level for severe spinal stenosis. The patient has had worsening gait since that time, however. The patient has some ongoing discomfort down the left leg, and weakness in both legs. The patient also has a tendency to drop things out of her hands. With standing, she has a tendency to lean backwards and fall. The patient will scissor legs with walking, and she will fall even when using a walker. The patient has rheumatoid arthritis, she is now off of methotrexate. She denies any issues controlling the bowels or the bladder. She reports no significant numbness of the extremities. She denies any neck pain or pain down arms. She had MRI evaluation of the brain when last seen, no obvious etiology of her gait disorder was noted. Blood work done at that time was unremarkable.  Past Medical History  Diagnosis Date  . DEPRESSION 05/21/2007  . FIBROCYSTIC BREAST DISEASE, HX OF 05/21/2007  . HYPERLIPIDEMIA-MIXED 11/26/2008    takes Simvastatin daily  . Irritable bowel syndrome 05/21/2007  . KNEE PAIN 09/16/2007  . LEG EDEMA, BILATERAL 05/21/2009  . OSTEOPENIA 05/21/2007  . URTICARIA, CHRONIC 05/21/2007  . History of aortic aneurysm     resection and grafting of a 5.2-cm ascending aortic arch aneurysm August 2009  . Dyslipidemia   . Vitamin D deficiency   . Urticaria   . Neuropathy     left radial  neuropathy with wrist drop  . Diverticulosis   . Irritable bowel syndrome   . GERD (gastroesophageal reflux disease)   . 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 Vaginal yeast infection     recently told to use monistat, hasn't started as of 01/26/2012  . HYPERTENSION 05/11/2008    takes Metoprolol and HCTZ daily  . Asthma     hx  of;as a child  . Seasonal allergies     takes Hydroxyzine prn and Zyrtec 2 times a week  . Peripheral edema     takes Furosemide prn  . Pneumonia     history of;in the 90's  . Weakness     hands and feet  . Sciatica   . OSTEOARTHRITIS 01/20/2008    takes Methotrexate weekly  . DJD (degenerative joint disease)     history of   . Rheumatoid arthritis(714.0)   . Chronic back pain     takes Norco prn  . Bruises easily     takes Plaquenil daily  . CORONARY ATHEROSCLEROSIS, ARTERY BYPASS GRAFT 08/23/2008  . CAD (coronary artery disease)     s/p CABG -  x 1 with LIMA to LAD August 2009 /   . Urinary incontinence   . Bowel incontinence   . History of colon polyps   . Urinary frequency   . Urinary urgency   . Nocturia   . Anemia     takes folic acid bid  . Urinary incontinence     wears depends    Past Surgical History  Procedure Laterality Date  . Appendectomy    . Cesarean section  1968    x 1  . Hernia repair    . Coronary artery bypass graft  August 2009    CABG x 1  with LIMA to LAD /  . Ascending aortic aneurysm repair  August 2009     resection and grafting of a 5.2-cm ascending aortic arch aneurysm  . Joint replacement      2009 & 2010- both knees  . Back surgery      2011- cerv. fusion   . Breast surgery      biopsy x2- R breast  . Tonsillectomy      as a child  . Cardiac catheterization  2009  . Colonoscopy    . Lumbar laminectomy/decompression microdiscectomy Bilateral 07/11/2013    Procedure: LUMBAR LAMINECTOMY/DECOMPRESSION MICRODISCECTOMY LUMBAR FIVE SACRAL ONE;  Surgeon: Mariam Dollar, MD;  Location: [facility];  Service: Neurosurgery;  Laterality: Bilateral;    Family History  Problem Relation Age of Onset  . Heart disease    . Coronary artery disease    . Anesthesia problems Neg Hx   . Hypotension Neg Hx   . Malignant hyperthermia Neg Hx   . Pseudochol deficiency Neg Hx   . Heart disease Mother   . Stroke Father   . Cancer Maternal Grandmother     . Stroke Paternal Grandmother     Social history:  reports that she has never smoked. She has never used smokeless tobacco. She reports that she does not drink alcohol or use illicit drugs.    Allergies  Allergen Reactions  . Amoxicillin     REACTION: unspecified  . Cyclobenzaprine Hcl     REACTION: rash  . Mupirocin     Unknown reaction  . Penicillins Hives  . Tetanus Toxoid Hives    Medications:  Prior to Admission medications   Medication Sig Start Date End Date Taking? Authorizing Provider  acetaminophen (TYLENOL) 500 MG tablet Take 500 mg by mouth every 6 (six) hours as needed (for pain.).   Yes Historical Provider, MD  aspirin EC 81 MG tablet Take 81 mg by mouth at bedtime.    Yes Historical Provider, MD  calcium carbonate (TUMS - DOSED IN MG ELEMENTAL CALCIUM) 500 MG chewable tablet Chew 1 tablet by mouth daily as needed for indigestion or heartburn.   Yes Historical Provider, MD  cetirizine (ZYRTEC) 10 MG tablet Take 10 mg by mouth daily as needed. Alternating with Hydroxyzine  For allergies   Yes Historical Provider, MD  cholecalciferol (VITAMIN D) 1000 UNITS tablet Take 1,000 Units by mouth daily.   Yes Historical Provider, MD  folic acid (FOLVITE) 1 MG tablet Take 1 mg by mouth 2 (two) times daily.   Yes Historical Provider, MD  furosemide (LASIX) 40 MG tablet Take 0.5-1 tablets (20-40 mg total) by mouth as needed. As needed for sweling Patient taking differently: Take 20-40 mg by mouth daily as needed for fluid. As needed for sweling 08/28/14  Yes Gordy Savers, MD  hydrochlorothiazide (HYDRODIURIL) 25 MG tablet Take 25 mg by mouth daily.   Yes Historical Provider, MD  HYDROcodone-acetaminophen (NORCO/VICODIN) 5-325 MG per tablet Take 1 tablet by mouth every 6 (six) hours as needed for moderate pain. 03/22/15  Yes Roderick Pee, MD  hydroxychloroquine (PLAQUENIL) 200 MG tablet Take 200 mg by mouth 2 (two) times daily.    Yes Historical Provider, MD  hydrOXYzine  (ATARAX/VISTARIL) 25 MG tablet Take 25 mg by mouth as needed. For allergies alternate with Zyrtec   Yes Historical Provider, MD  methotrexate (RHEUMATREX) 2.5 MG tablet Take 15 mg by mouth once a week. Caution:Chemotherapy. Protect from light. Take on Wednesday   Yes  Historical Provider, MD  metoprolol succinate (TOPROL-XL) 50 MG 24 hr tablet TAKE 1 TABLET BY MOUTH EVERY DAY 12/08/14  Yes Gordy Savers, MD  Pediatric Multivit-Minerals-C (COMPLETE MULTI-VITAMIN PO) Take 1 tablet by mouth daily.   Yes Historical Provider, MD  potassium chloride SA (K-DUR,KLOR-CON) 20 MEQ tablet Take 20 mEq by mouth See admin instructions. Take 1 tablet daily as needed for low potassium. (Only take when needed with the lasix)   Yes Historical Provider, MD  simvastatin (ZOCOR) 40 MG tablet TAKE 1 TABLET BY MOUTH AT BEDTIME 02/22/15  Yes Gordy Savers, MD    ROS:  Out of a complete 14 system review of symptoms, the patient complains only of the following symptoms, and all other reviewed systems are negative.  Gait disorder Weakness  Blood pressure 148/56, pulse 70, height 5\' 4"  (1.626 m).  Physical Exam  General: The patient is alert and cooperative at the time of the examination.  Skin: No significant peripheral edema is noted.   Neurologic Exam  Mental status: The patient is alert and oriented x 3 at the time of the examination. The patient has apparent normal recent and remote memory, with an apparently normal attention span and concentration ability.   Cranial nerves: Facial symmetry is present. Speech is normal, no aphasia or dysarthria is noted. Extraocular movements are full. Visual fields are full.  Motor: The patient has good strength in all 4 extremities.  Sensory examination: Soft touch sensation is symmetric on the face, arms, and legs.  Coordination: The patient has good finger-nose-finger and heel-to-shin bilaterally.  Gait and station: The patient has a normal gait. Tandem  gait is normal. Romberg is negative. No drift is seen.  Reflexes: Deep tendon reflexes are symmetric.   MRI brain 12/29/14:  IMPRESSION: This is a mildly abnormal MRI of the brain for this patient's age showing hyperintense foci in the hemispheres most consistent with age-related small vessel ischemic change. Brain volume and cerebellar volume are normal for age. There are no acute findings.  * MRI scan images were reviewed online. I agree with the written report.     Assessment/Plan:  1. Severe gait disorder  2. Lumbosacral spine surgery, left leg pain  The etiology of the gait disorder is not completely clear, and it may be multifactorial. The patient will be set up for MRI evaluation of the cervical and thoracic spine, EMG evaluation will be done on both legs, nerve conduction studies will be done on both legs. The patient will be set up for physical therapy for her gait. We will see her back for the EMG evaluation.  Marlan Palau MD 04/25/2015 7:02 PM  [facility] [address] [place], [place] [address]  Phone [phone] Fax [phone]

## 2015-04-25 NOTE — Patient Instructions (Signed)

## 2015-05-02 ENCOUNTER — Telehealth: Payer: Self-pay | Admitting: Internal Medicine

## 2015-05-02 NOTE — Telephone Encounter (Signed)
Pt needs new rx hydrocodone °

## 2015-05-03 MED ORDER — HYDROCODONE-ACETAMINOPHEN 5-325 MG PO TABS: 1.0000 | ORAL_TABLET | Freq: Four times a day (QID) | ORAL | Status: DC | PRN

## 2015-05-03 NOTE — Telephone Encounter (Signed)
Left detailed message Rx ready for pickup. Rx printed and signed. 

## 2015-05-08 ENCOUNTER — Ambulatory Visit
Admission: RE | Admit: 2015-05-08 | Discharge: 2015-05-08 | Disposition: A | Discharge status: Home / Self Care | Payer: Medicare Other | Source: Ambulatory Visit | Attending: Neurology | Admitting: Neurology

## 2015-05-08 DIAGNOSIS — R2681 Unsteadiness on feet: Secondary | ICD-10-CM | POA: Diagnosis not present

## 2015-05-09 ENCOUNTER — Telehealth: Payer: Self-pay | Admitting: Neurology

## 2015-05-09 NOTE — Telephone Encounter (Signed)
I called the patient. The MRI of the cervical spine shows stenosis at the C3-4 level, with flattening of the cord, but no critical compression is seen. No cord signal. No sure this alone explains gait disorder. The MRI of the thoracic spine is OK. Will get EMG study.   MRI cervical 05/09/15:  IMPRESSION:  Abnormal MRI cervical spine (without) demonstrating: 1. At C3-4: disc bulging and uncovertebral joint hypertrophy and facet hypertrophy with moderate-severe spinal stenosis and severe biforaminal stenosis; no abnormal spinal cord lesions 2. At C2-3: disc bulging and uncovertebral joint hypertrophy, with severe biforaminal stenosis  3. At C4-5: uncovertebral joint hypertrophy with moderate biforaminal stenosis  4. At T1-2: disc bulging and uncovertebral joint hypertrophy with mild left foraminal stenosis  5. Compared to MRI on 02/23/14, there has been slight progression of degenerative disease especially at C3-4.    MRI thoracic 05/09/15:  IMPRESSION:  Unremarkable MRI thoracic spine (without). No change from MRI on 04/09/10.

## 2015-05-11 ENCOUNTER — Other Ambulatory Visit: Payer: Self-pay | Admitting: Internal Medicine

## 2015-05-17 ENCOUNTER — Ambulatory Visit: Payer: Medicare Other | Admitting: Internal Medicine

## 2015-05-17 ENCOUNTER — Other Ambulatory Visit: Payer: Self-pay | Admitting: Neurology

## 2015-05-17 ENCOUNTER — Encounter: Payer: Self-pay | Admitting: Neurology

## 2015-05-17 ENCOUNTER — Ambulatory Visit (INDEPENDENT_AMBULATORY_CARE_PROVIDER_SITE_OTHER): Payer: Self-pay | Admitting: Neurology

## 2015-05-17 ENCOUNTER — Ambulatory Visit (INDEPENDENT_AMBULATORY_CARE_PROVIDER_SITE_OTHER): Payer: Medicare Other | Admitting: Neurology

## 2015-05-17 DIAGNOSIS — G609 Hereditary and idiopathic neuropathy, unspecified: Secondary | ICD-10-CM

## 2015-05-17 DIAGNOSIS — R2681 Unsteadiness on feet: Secondary | ICD-10-CM | POA: Diagnosis not present

## 2015-05-17 NOTE — Progress Notes (Signed)
Please refer to EMG and nerve conduction study procedure note. 

## 2015-05-17 NOTE — Procedures (Signed)
     HISTORY:  [name] is an 81 year old patient with a history of progressive gait disorder, with multiple falls. The patient is being evaluated for the gait disturbance.  NERVE CONDUCTION STUDIES:  Nerve conduction studies were performed on the right upper extremity. The distal motor latencies and motor amplitudes for the median and ulnar nerves were within normal limits. The F wave latencies and nerve conduction velocities for these nerves were also normal. The sensory latencies for the median and ulnar nerves were normal.  Nerve conduction studies were performed on both lower extremities. The distal motor latency for the right peroneal nerve was normal, with a low motor amplitude. No response was seen on the left. The distal motor latencies for the posterior tibial nerves were normal bilaterally, with low motor amplitudes for these nerves bilaterally. The nerve conduction velocities for the right peroneal nerve and for both posterior tibial nerves were normal. The H reflex latencies were unobtainable bilaterally, and the peroneal sensory latencies were unobtainable bilaterally.  EMG STUDIES:  EMG study was performed on the right lower extremity:  The tibialis anterior muscle reveals 2 to 6K motor units with decreased recruitment. No fibrillations or positive waves were seen. The peroneus tertius muscle reveals 2 to 8K motor units with moderately decreased recruitment. No fibrillations or positive waves were seen. The medial gastrocnemius muscle reveals 2 to 6K motor units with decreased recruitment. No fibrillations or positive waves were seen. The vastus lateralis muscle reveals 2 to 4K motor units with full recruitment. No fibrillations or positive waves were seen. The iliopsoas muscle reveals 2 to 4K motor units with full recruitment. No fibrillations or positive waves were seen. The biceps femoris muscle (long head) reveals 2 to 4K motor units with full recruitment. No  fibrillations or positive waves were seen. The lumbosacral paraspinal muscles were tested at 3 levels, and revealed no abnormalities of insertional activity at all 3 levels tested. There was good relaxation.   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 EMG evaluation of the right lower extremity shows distal chronic signs of denervation consistent with the diagnosis of a peripheral neuropathy. There does not appear to be evidence of an overlying lumbosacral radiculopathy. A prior study done in January 2013 shows normal nerve conduction studies of the lower extremities, this study suggests significant progression of the peripheral nerve function on this evaluation.  Marlan Palau MD 05/17/2015 4:54 PM  [facility] [address] [place], [place] [address]  Phone [phone] Fax [phone]

## 2015-05-17 NOTE — Progress Notes (Signed)
EMG and nerve conduction study done today shows evidence of a moderate to severe peripheral neuropathy of axonal type. The study done in January 2013 was normal. The patient will be sent for further blood work today. There does appear to be some mild features of parkinsonism, mild masking of the face, some superior gaze deviation issues. The gait remains quite abnormal with long strides, scissoring of the legs. We'll need to consider the possibility of a parkinsonism syndrome of some sort, possibly Parkinson's disease or PSP. The peripheral neuropathy is likely not the sole etiology of the gait disorder.

## 2015-05-22 ENCOUNTER — Telehealth: Payer: Self-pay | Admitting: Neurology

## 2015-05-22 LAB — MULTIPLE MYELOMA PANEL, SERUM
ALBUMIN/GLOB SERPL: 1.3 (ref 0.7–1.7)
ALPHA2 GLOB SERPL ELPH-MCNC: 0.6 g/dL (ref 0.4–1.0)
Albumin SerPl Elph-Mcnc: 3.5 g/dL (ref 2.9–4.4)
Alpha 1: 0.3 g/dL (ref 0.0–0.4)
B-Globulin SerPl Elph-Mcnc: 1.2 g/dL (ref 0.7–1.3)
Gamma Glob SerPl Elph-Mcnc: 0.8 g/dL (ref 0.4–1.8)
Globulin, Total: 2.8 g/dL (ref 2.2–3.9)
IGM (IMMUNOGLOBULIN M), SRM: 53 mg/dL (ref 26–217)
IgA/Immunoglobulin A, Serum: 400 mg/dL (ref 64–422)
IgG (Immunoglobin G), Serum: 950 mg/dL (ref 700–1600)
TOTAL PROTEIN: 6.3 g/dL (ref 6.0–8.5)

## 2015-05-22 LAB — ENA+DNA/DS+SJORGEN'S
ENA RNP Ab: 0.2 AI (ref 0.0–0.9)
ENA SM Ab Ser-aCnc: <0.2 AI (ref 0.0–0.9)
ENA SSA (RO) Ab: <0.2 AI (ref 0.0–0.9)
ENA SSB (LA) Ab: <0.2 AI (ref 0.0–0.9)
dsDNA Ab: 10 IU/mL — ABNORMAL HIGH (ref 0–9)

## 2015-05-22 LAB — ANA W/REFLEX: ANA: POSITIVE — AB

## 2015-05-22 NOTE — Telephone Encounter (Signed)
I called the patient. The blood work shows a positive ANA, but this is associated with a very low grade elevation in the ds DNA ab, not likely to be clinically significant.

## 2015-05-24 ENCOUNTER — Ambulatory Visit (INDEPENDENT_AMBULATORY_CARE_PROVIDER_SITE_OTHER): Payer: Medicare Other | Admitting: Internal Medicine

## 2015-05-24 ENCOUNTER — Encounter: Payer: Self-pay | Admitting: Internal Medicine

## 2015-05-24 VITALS — BP 140/60 | HR 64 | Temp 98.2°F | Resp 18 | Ht 63.0 in | Wt 143.0 lb

## 2015-05-24 DIAGNOSIS — I10 Essential (primary) hypertension: Secondary | ICD-10-CM | POA: Diagnosis not present

## 2015-05-24 DIAGNOSIS — E785 Hyperlipidemia, unspecified: Secondary | ICD-10-CM | POA: Diagnosis not present

## 2015-05-24 DIAGNOSIS — I2510 Atherosclerotic heart disease of native coronary artery without angina pectoris: Secondary | ICD-10-CM

## 2015-05-24 DIAGNOSIS — G609 Hereditary and idiopathic neuropathy, unspecified: Secondary | ICD-10-CM

## 2015-05-24 DIAGNOSIS — R2681 Unsteadiness on feet: Secondary | ICD-10-CM | POA: Diagnosis not present

## 2015-05-24 MED ORDER — HYDROCODONE-ACETAMINOPHEN 5-325 MG PO TABS: 1.0000 | ORAL_TABLET | Freq: Four times a day (QID) | ORAL | Status: DC | PRN

## 2015-05-24 NOTE — Progress Notes (Signed)
Subjective:    Patient ID: [name], female    DOB: May 26, 1932, 81 y.o.   MRN: [id number]  HPI   81 year old patient who is seen today for her biannual follow-up. She is followed closely by cardiology and rheumatology and has had a recent neurology evaluation due to her severe gait abnormality. This is felt to be multi-factorial but most related to his severe peripheral neuropathy. She has OA as well as RA.  She is requesting a refill of her analgesics.  She has coronary artery disease dyslipidemia  And is maintained on statin therapy.  No ischemic symptoms.  Past Medical History  Diagnosis Date  . DEPRESSION 05/21/2007  . FIBROCYSTIC BREAST DISEASE, HX OF 05/21/2007  . HYPERLIPIDEMIA-MIXED 11/26/2008    takes Simvastatin daily  . Irritable bowel syndrome 05/21/2007  . KNEE PAIN 09/16/2007  . LEG EDEMA, BILATERAL 05/21/2009  . OSTEOPENIA 05/21/2007  . URTICARIA, CHRONIC 05/21/2007  . History of aortic aneurysm     resection and grafting of a 5.2-cm ascending aortic arch aneurysm August 2009  . Dyslipidemia   . Vitamin D deficiency   . Urticaria   . Neuropathy     left radial  neuropathy with wrist drop  . Diverticulosis   . Irritable bowel syndrome   . GERD (gastroesophageal reflux disease)   . 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 Vaginal yeast infection     recently told to use monistat, hasn't started as of 01/26/2012  . HYPERTENSION 05/11/2008    takes Metoprolol and HCTZ daily  . Asthma     hx of;as a child  . Seasonal allergies     takes Hydroxyzine prn and Zyrtec 2 times a week  . Peripheral edema     takes Furosemide prn  . Pneumonia     history of;in the 90's  . Weakness     hands and feet  . Sciatica   . OSTEOARTHRITIS 01/20/2008    takes Methotrexate weekly  . DJD (degenerative joint disease)     history of   . Rheumatoid arthritis(714.0)   . Chronic back pain     takes Norco prn  . Bruises easily     takes Plaquenil daily  . CORONARY  ATHEROSCLEROSIS, ARTERY BYPASS GRAFT 08/23/2008  . CAD (coronary artery disease)     s/p CABG -  x 1 with LIMA to LAD August 2009 /   . Urinary incontinence   . Bowel incontinence   . History of colon polyps   . Urinary frequency   . Urinary urgency   . Nocturia   . Anemia     takes folic acid bid  . Urinary incontinence     wears depends  . Hereditary and idiopathic peripheral neuropathy 05/17/2015    History   Social History  . Marital Status: Widowed    Spouse Name: N/A  . Number of Children: 2  . Years of Education: N/A   Occupational History  . retired    Social History Main Topics  . Smoking status: Never Smoker   . Smokeless tobacco: Never Used  . Alcohol Use: No  . Drug Use: No  . Sexual Activity: No   Other Topics Concern  . Not on file   Social History Narrative   Patient is right handed.   Patient drinks a little caffeine daily.    Past Surgical History  Procedure Laterality Date  . Appendectomy    . Cesarean  section  1968    x 1  . Hernia repair    . Coronary artery bypass graft  August 2009    CABG x 1 with LIMA to LAD /  . Ascending aortic aneurysm repair  August 2009     resection and grafting of a 5.2-cm ascending aortic arch aneurysm  . Joint replacement      2009 & 2010- both knees  . Back surgery      2011- cerv. fusion   . Breast surgery      biopsy x2- R breast  . Tonsillectomy      as a child  . Cardiac catheterization  2009  . Colonoscopy    . Lumbar laminectomy/decompression microdiscectomy Bilateral 07/11/2013    Procedure: LUMBAR LAMINECTOMY/DECOMPRESSION MICRODISCECTOMY LUMBAR FIVE SACRAL ONE;  Surgeon: Mariam Dollar, MD;  Location: [facility];  Service: Neurosurgery;  Laterality: Bilateral;    Family History  Problem Relation Age of Onset  . Heart disease    . Coronary artery disease    . Anesthesia problems Neg Hx   . Hypotension Neg Hx   . Malignant hyperthermia Neg Hx   . Pseudochol deficiency Neg Hx   . Heart  disease Mother   . Stroke Father   . Cancer Maternal Grandmother   . Stroke Paternal Grandmother     Allergies  Allergen Reactions  . Amoxicillin     REACTION: unspecified  . Cyclobenzaprine Hcl     REACTION: rash  . Mupirocin     Unknown reaction  . Penicillins Hives  . Tetanus Toxoid Hives    Current Outpatient Prescriptions on File Prior to Visit  Medication Sig Dispense Refill  . acetaminophen (TYLENOL) 500 MG tablet Take 500 mg by mouth every 6 (six) hours as needed (for pain.).    Marland Kitchen aspirin EC 81 MG tablet Take 81 mg by mouth at bedtime.     . calcium carbonate (TUMS - DOSED IN MG ELEMENTAL CALCIUM) 500 MG chewable tablet Chew 1 tablet by mouth daily as needed for indigestion or heartburn.    . cetirizine (ZYRTEC) 10 MG tablet Take 10 mg by mouth daily as needed. Alternating with Hydroxyzine  For allergies    . cholecalciferol (VITAMIN D) 1000 UNITS tablet Take 1,000 Units by mouth daily.    . folic acid (FOLVITE) 1 MG tablet Take 1 mg by mouth 2 (two) times daily.    . furosemide (LASIX) 40 MG tablet Take 0.5-1 tablets (20-40 mg total) by mouth as needed. As needed for sweling (Patient taking differently: Take 20-40 mg by mouth daily as needed for fluid. As needed for sweling) 30 tablet 5  . hydrochlorothiazide (HYDRODIURIL) 25 MG tablet Take 25 mg by mouth daily.    Marland Kitchen HYDROcodone-acetaminophen (NORCO/VICODIN) 5-325 MG per tablet Take 1 tablet by mouth every 6 (six) hours as needed for moderate pain. 60 tablet 0  . hydroxychloroquine (PLAQUENIL) 200 MG tablet Take 200 mg by mouth 2 (two) times daily.     . hydrOXYzine (ATARAX/VISTARIL) 25 MG tablet Take 25 mg by mouth as needed. For allergies alternate with Zyrtec    . methotrexate (RHEUMATREX) 2.5 MG tablet Take 15 mg by mouth once a week. Caution:Chemotherapy. Protect from light. Take on Wednesday    . metoprolol succinate (TOPROL-XL) 50 MG 24 hr tablet TAKE 1 TABLET BY MOUTH EVERY DAY 30 tablet 5  . Pediatric  Multivit-Minerals-C (COMPLETE MULTI-VITAMIN PO) Take 1 tablet by mouth daily.    . potassium chloride  SA (K-DUR,KLOR-CON) 20 MEQ tablet Take 20 mEq by mouth See admin instructions. Take 1 tablet daily as needed for low potassium. (Only take when needed with the lasix)    . simvastatin (ZOCOR) 40 MG tablet TAKE 1 TABLET BY MOUTH AT BEDTIME 30 tablet 5   No current facility-administered medications on file prior to visit.    BP 140/60 mmHg  Pulse 64  Temp(Src) 98.2 F (36.8 C) (Oral)  Resp 18  Ht 5\' 3"  (1.6 m)  Wt 143 lb (64.864 kg)  BMI 25.34 kg/m2  SpO2 95%     Review of Systems  Constitutional: Positive for unexpected weight change.  HENT: Negative for congestion, dental problem, hearing loss, rhinorrhea, sinus pressure, sore throat and tinnitus.   Eyes: Negative for pain, discharge and visual disturbance.  Respiratory: Negative for cough and shortness of breath.   Cardiovascular: Positive for leg swelling. Negative for chest pain and palpitations.  Gastrointestinal: Negative for nausea, vomiting, abdominal pain, diarrhea, constipation, blood in stool and abdominal distention.  Genitourinary: Negative for dysuria, urgency, frequency, hematuria, flank pain, vaginal bleeding, vaginal discharge, difficulty urinating, vaginal pain and pelvic pain.  Musculoskeletal: Negative for joint swelling, arthralgias and gait problem.  Skin: Negative for rash.  Neurological: Positive for weakness. Negative for dizziness, syncope, speech difficulty, numbness and headaches.  Hematological: Negative for adenopathy.  Psychiatric/Behavioral: Negative for behavioral problems, dysphoric mood and agitation. The patient is not nervous/anxious.        Objective:   Physical Exam  Constitutional: She is oriented to person, place, and time. She appears well-developed and well-nourished.  Wheelchair bound Blood pressure 130/64  HENT:  Head: Normocephalic.  Right Ear: External ear normal.  Left Ear:  External ear normal.  Mouth/Throat: Oropharynx is clear and moist.  Eyes: Conjunctivae and EOM are normal. Pupils are equal, round, and reactive to light.  Neck: Normal range of motion. Neck supple. No thyromegaly present.  Cardiovascular: Normal rate, regular rhythm, normal heart sounds and intact distal pulses.   Pulmonary/Chest: Effort normal and breath sounds normal.  Abdominal: Soft. Bowel sounds are normal. She exhibits no mass. There is no tenderness.  Musculoskeletal: Normal range of motion.  Trace edema  Lymphadenopathy:    She has no cervical adenopathy.  Neurological: She is alert and oriented to person, place, and time.  Skin: Skin is warm and dry. No rash noted.  Psychiatric: She has a normal mood and affect. Her behavior is normal.          Assessment & Plan:    Essential hypertension, stable Coronary artery disease, stable Gait instability Severe peripheral neuropathy Dyslipidemia.  Continue statin therapy RA.  Follow-up rheumatology  Analgesics refilled Recheck 6 months or as needed

## 2015-05-24 NOTE — Patient Instructions (Signed)
Limit your sodium (Salt) intake  Return in 6 months for follow-up  

## 2015-05-24 NOTE — Progress Notes (Signed)
Pre visit review using our clinic review tool, if applicable. No additional management support is needed unless otherwise documented below in the visit note. 

## 2015-06-18 ENCOUNTER — Other Ambulatory Visit: Payer: Self-pay | Admitting: Internal Medicine

## 2015-06-21 ENCOUNTER — Telehealth: Payer: Self-pay | Admitting: Internal Medicine

## 2015-06-21 MED ORDER — HYDROCODONE-ACETAMINOPHEN 5-325 MG PO TABS: 1.0000 | ORAL_TABLET | Freq: Four times a day (QID) | ORAL | Status: DC | PRN

## 2015-06-21 NOTE — Telephone Encounter (Signed)
Pt request refill HYDROcodone-acetaminophen (NORCO/VICODIN) 5-325 MG per tablet   Also son states he thinks the bursa jn pt's elbow may have ruptured. Made appt for this Monday.

## 2015-06-21 NOTE — Telephone Encounter (Signed)
Spoke to pt's son Trey Paula, told him Rx is ready for pickup. Trey Paula verbalized understanding. Rx printed and signed.

## 2015-06-25 ENCOUNTER — Encounter: Payer: Self-pay | Admitting: Internal Medicine

## 2015-06-25 ENCOUNTER — Other Ambulatory Visit: Payer: Self-pay | Admitting: Internal Medicine

## 2015-06-25 ENCOUNTER — Ambulatory Visit (INDEPENDENT_AMBULATORY_CARE_PROVIDER_SITE_OTHER): Payer: Medicare Other | Admitting: Internal Medicine

## 2015-06-25 VITALS — BP 110/60 | HR 80 | Temp 98.1°F | Resp 18 | Ht 63.0 in | Wt 142.0 lb

## 2015-06-25 DIAGNOSIS — M159 Polyosteoarthritis, unspecified: Secondary | ICD-10-CM

## 2015-06-25 DIAGNOSIS — M150 Primary generalized (osteo)arthritis: Principal | ICD-10-CM

## 2015-06-25 DIAGNOSIS — R2681 Unsteadiness on feet: Secondary | ICD-10-CM

## 2015-06-25 DIAGNOSIS — M7022 Olecranon bursitis, left elbow: Secondary | ICD-10-CM

## 2015-06-25 DIAGNOSIS — G609 Hereditary and idiopathic neuropathy, unspecified: Secondary | ICD-10-CM

## 2015-06-25 DIAGNOSIS — L03114 Cellulitis of left upper limb: Secondary | ICD-10-CM

## 2015-06-25 DIAGNOSIS — M158 Other polyosteoarthritis: Secondary | ICD-10-CM

## 2015-06-25 DIAGNOSIS — R296 Repeated falls: Secondary | ICD-10-CM

## 2015-06-25 MED ORDER — DOXYCYCLINE HYCLATE 100 MG PO TABS: 100.0000 mg | ORAL_TABLET | Freq: Two times a day (BID) | ORAL | Status: DC

## 2015-06-25 NOTE — Progress Notes (Signed)
Subjective:    Patient ID: [name], female    DOB: December 19, 1931, 81 y.o.   MRN: [id number]  HPI  81 year old patient who has a history of RA, as well as DJD.  She also has history of intermittent bursitis involving the left elbow area.  Over the weekend, she has developed more pain, swelling and redness.  There is been some spontaneous drainage.  No fever or other constitutional complaints.  She states that the elbow actually has improved over the past 24 hours.  She has been treated in the past for lower extremity cellulitis.  She does have a penicillin allergy.  She is also seen for a face to face evaluation to determine the need for a powered wheelchair.  The patient has a long history of gait instability which is multi-factorial.  Factors include generalized weakness.  Peripheral neuropathy as well as RA and osteoarthritis.  The patient has had multiple falls with a tendency to fall backwards.  She has required treatment a number of times for trauma related to her falls.  She has been evaluated by neurology and has had physical therapy without much benefit.  She remains a very high fall risk.  She is ambulatory with a walker but is quite limited due to poor exercise capacity.  In spite of using her walker.  She still has had frequent falls  Past Medical History  Diagnosis Date  . DEPRESSION 05/21/2007  . FIBROCYSTIC BREAST DISEASE, HX OF 05/21/2007  . HYPERLIPIDEMIA-MIXED 11/26/2008    takes Simvastatin daily  . Irritable bowel syndrome 05/21/2007  . KNEE PAIN 09/16/2007  . LEG EDEMA, BILATERAL 05/21/2009  . OSTEOPENIA 05/21/2007  . URTICARIA, CHRONIC 05/21/2007  . History of aortic aneurysm     resection and grafting of a 5.2-cm ascending aortic arch aneurysm August 2009  . Dyslipidemia   . Vitamin D deficiency   . Urticaria   . Neuropathy     left radial  neuropathy with wrist drop  . Diverticulosis   . Irritable bowel syndrome   . GERD (gastroesophageal reflux disease)   .  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 Vaginal yeast infection     recently told to use monistat, hasn't started as of 01/26/2012  . HYPERTENSION 05/11/2008    takes Metoprolol and HCTZ daily  . Asthma     hx of;as a child  . Seasonal allergies     takes Hydroxyzine prn and Zyrtec 2 times a week  . Peripheral edema     takes Furosemide prn  . Pneumonia     history of;in the 90's  . Weakness     hands and feet  . Sciatica   . OSTEOARTHRITIS 01/20/2008    takes Methotrexate weekly  . DJD (degenerative joint disease)     history of   . Rheumatoid arthritis(714.0)   . Chronic back pain     takes Norco prn  . Bruises easily     takes Plaquenil daily  . CORONARY ATHEROSCLEROSIS, ARTERY BYPASS GRAFT 08/23/2008  . CAD (coronary artery disease)     s/p CABG -  x 1 with LIMA to LAD August 2009 /   . Urinary incontinence   . Bowel incontinence   . History of colon polyps   . Urinary frequency   . Urinary urgency   . Nocturia   . Anemia     takes folic acid bid  . Urinary incontinence  wears depends  . Hereditary and idiopathic peripheral neuropathy 05/17/2015    History   Social History  . Marital Status: Widowed    Spouse Name: N/A  . Number of Children: 2  . Years of Education: N/A   Occupational History  . retired    Social History Main Topics  . Smoking status: Never Smoker   . Smokeless tobacco: Never Used  . Alcohol Use: No  . Drug Use: No  . Sexual Activity: No   Other Topics Concern  . Not on file   Social History Narrative   Patient is right handed.   Patient drinks a little caffeine daily.    Past Surgical History  Procedure Laterality Date  . Appendectomy    . Cesarean section  1968    x 1  . Hernia repair    . Coronary artery bypass graft  August 2009    CABG x 1 with LIMA to LAD /  . Ascending aortic aneurysm repair  August 2009     resection and grafting of a 5.2-cm ascending aortic arch aneurysm  . Joint replacement      2009  & 2010- both knees  . Back surgery      2011- cerv. fusion   . Breast surgery      biopsy x2- R breast  . Tonsillectomy      as a child  . Cardiac catheterization  2009  . Colonoscopy    . Lumbar laminectomy/decompression microdiscectomy Bilateral 07/11/2013    Procedure: LUMBAR LAMINECTOMY/DECOMPRESSION MICRODISCECTOMY LUMBAR FIVE SACRAL ONE;  Surgeon: Mariam Dollar, MD;  Location: [facility];  Service: Neurosurgery;  Laterality: Bilateral;    Family History  Problem Relation Age of Onset  . Heart disease    . Coronary artery disease    . Anesthesia problems Neg Hx   . Hypotension Neg Hx   . Malignant hyperthermia Neg Hx   . Pseudochol deficiency Neg Hx   . Heart disease Mother   . Stroke Father   . Cancer Maternal Grandmother   . Stroke Paternal Grandmother     Allergies  Allergen Reactions  . Amoxicillin     REACTION: unspecified  . Cyclobenzaprine Hcl     REACTION: rash  . Mupirocin     Unknown reaction  . Penicillins Hives  . Tetanus Toxoid Hives    Current Outpatient Prescriptions on File Prior to Visit  Medication Sig Dispense Refill  . acetaminophen (TYLENOL) 500 MG tablet Take 500 mg by mouth every 6 (six) hours as needed (for pain.).    Marland Kitchen aspirin EC 81 MG tablet Take 81 mg by mouth at bedtime.     . calcium carbonate (TUMS - DOSED IN MG ELEMENTAL CALCIUM) 500 MG chewable tablet Chew 1 tablet by mouth daily as needed for indigestion or heartburn.    . cetirizine (ZYRTEC) 10 MG tablet Take 10 mg by mouth daily as needed. Alternating with Hydroxyzine  For allergies    . cholecalciferol (VITAMIN D) 1000 UNITS tablet Take 1,000 Units by mouth daily.    . folic acid (FOLVITE) 1 MG tablet Take 1 mg by mouth 2 (two) times daily.    . furosemide (LASIX) 40 MG tablet Take 0.5-1 tablets (20-40 mg total) by mouth as needed. As needed for sweling (Patient taking differently: Take 20-40 mg by mouth daily as needed for fluid. As needed for sweling) 30 tablet 5  .  hydrochlorothiazide (HYDRODIURIL) 25 MG tablet Take 25 mg by mouth daily.    Marland Kitchen  hydrochlorothiazide (HYDRODIURIL) 25 MG tablet TAKE 1 TABLET BY MOUTH EVERY MORNING 90 tablet 1  . HYDROcodone-acetaminophen (NORCO/VICODIN) 5-325 MG per tablet Take 1 tablet by mouth every 6 (six) hours as needed for moderate pain. 60 tablet 0  . hydroxychloroquine (PLAQUENIL) 200 MG tablet Take 200 mg by mouth 2 (two) times daily.     . hydrOXYzine (ATARAX/VISTARIL) 25 MG tablet Take 25 mg by mouth as needed. For allergies alternate with Zyrtec    . methotrexate (RHEUMATREX) 2.5 MG tablet Take 15 mg by mouth once a week. Caution:Chemotherapy. Protect from light. Take on Wednesday    . metoprolol succinate (TOPROL-XL) 50 MG 24 hr tablet TAKE 1 TABLET BY MOUTH EVERY DAY 30 tablet 5  . Pediatric Multivit-Minerals-C (COMPLETE MULTI-VITAMIN PO) Take 1 tablet by mouth daily.    . potassium chloride SA (K-DUR,KLOR-CON) 20 MEQ tablet Take 20 mEq by mouth See admin instructions. Take 1 tablet daily as needed for low potassium. (Only take when needed with the lasix)    . simvastatin (ZOCOR) 40 MG tablet TAKE 1 TABLET BY MOUTH AT BEDTIME 30 tablet 5   No current facility-administered medications on file prior to visit.    BP 110/60 mmHg  Pulse 80  Temp(Src) 98.1 F (36.7 C) (Oral)  Resp 18  Ht 5\' 3"  (1.6 m)  Wt 142 lb (64.411 kg)  BMI 25.16 kg/m2  SpO2 96%      Review of Systems  Constitutional: Negative.   HENT: Negative for congestion, dental problem, hearing loss, rhinorrhea, sinus pressure, sore throat and tinnitus.   Eyes: Negative for pain, discharge and visual disturbance.  Respiratory: Negative for cough and shortness of breath.   Cardiovascular: Negative for chest pain, palpitations and leg swelling.  Gastrointestinal: Negative for nausea, vomiting, abdominal pain, diarrhea, constipation, blood in stool and abdominal distention.  Genitourinary: Negative for dysuria, urgency, frequency, hematuria, flank  pain, vaginal bleeding, vaginal discharge, difficulty urinating, vaginal pain and pelvic pain.  Musculoskeletal: Positive for arthralgias and gait problem. Negative for joint swelling.  Skin: Positive for rash and wound.  Neurological: Negative for dizziness, syncope, speech difficulty, weakness, numbness and headaches.  Hematological: Negative for adenopathy.  Psychiatric/Behavioral: Negative for behavioral problems, dysphoric mood and agitation. The patient is not nervous/anxious.        Objective:   Physical Exam  Constitutional: She appears well-developed and well-nourished. No distress.  Wheelchair bound Afebrile  Skin:  Erythema and soft tissue swelling about the left elbow extending to the mid forearm area.  Small tiny ulceration noted over the olecranon, but presently not draining.  Some postinflammatory desquamation of the skin noted          Assessment & Plan:  Chronic bursitis left elbow.  Now with cellulitis.  Will treat with 10 days of doxycycline.  She report any worsening pain, swelling or redness or constitutional complaints such as fever  Gait instability High fall risk RA/DJD Deconditioning  We'll process.  Needed paperwork to obtain a powered wheelchair

## 2015-06-25 NOTE — Patient Instructions (Addendum)
Take your antibiotic as prescribed until ALL of it is gone, but stop if you develop a rash, swelling, or any side effects of the medication.  Contact our office as soon as possible if  there are side effects of the medication.  Limit your sodium (Salt) intake  Return in 6 months for follow-up  Cellulitis Cellulitis is an infection of the skin and the tissue beneath it. The infected area is usually red and tender. Cellulitis occurs most often in the arms and lower legs.  CAUSES  Cellulitis is caused by bacteria that enter the skin through cracks or cuts in the skin. The most common types of bacteria that cause cellulitis are staphylococci and streptococci. SIGNS AND SYMPTOMS   Redness and warmth.  Swelling.  Tenderness or pain.  Fever. DIAGNOSIS  Your health care provider can usually determine what is wrong based on a physical exam. Blood tests may also be done. TREATMENT  Treatment usually involves taking an antibiotic medicine. HOME CARE INSTRUCTIONS   Take your antibiotic medicine as directed by your health care provider. Finish the antibiotic even if you start to feel better.  Keep the infected arm or leg elevated to reduce swelling.  Apply a warm cloth to the affected area up to 4 times per day to relieve pain.  Take medicines only as directed by your health care provider.  Keep all follow-up visits as directed by your health care provider. SEEK MEDICAL CARE IF:   You notice red streaks coming from the infected area.  Your red area gets larger or turns dark in color.  Your bone or joint underneath the infected area becomes painful after the skin has healed.  Your infection returns in the same area or another area.  You notice a swollen bump in the infected area.  You develop new symptoms.  You have a fever. SEEK IMMEDIATE MEDICAL CARE IF:   You feel very sleepy.  You develop vomiting or diarrhea.  You have a general ill feeling (malaise) with muscle aches  and pains. MAKE SURE YOU:   Understand these instructions.  Will watch your condition.  Will get help right away if you are not doing well or get worse. Document Released: 08/20/2005 Document Revised: 03/27/2014 Document Reviewed: 01/26/2012 [facility] Patient Information 2015 [place], [place]. This information is not intended to replace advice given to you by your health care provider. Make sure you discuss any questions you have with your health care provider.

## 2015-06-25 NOTE — Progress Notes (Signed)
Pre visit review using our clinic review tool, if applicable. No additional management support is needed unless otherwise documented below in the visit note. 

## 2015-07-23 ENCOUNTER — Telehealth: Payer: Self-pay | Admitting: Internal Medicine

## 2015-07-23 NOTE — Telephone Encounter (Signed)
Pt request refill HYDROcodone-acetaminophen (NORCO/VICODIN) 5-325 MG per tablet °

## 2015-07-24 MED ORDER — HYDROCODONE-ACETAMINOPHEN 5-325 MG PO TABS: 1.0000 | ORAL_TABLET | Freq: Four times a day (QID) | ORAL | Status: DC | PRN

## 2015-07-24 NOTE — Telephone Encounter (Signed)
Left detailed message on voicemail Rx ready for pickup, will be at the front desk. Rx printed and signed.  

## 2015-08-27 ENCOUNTER — Encounter: Payer: Self-pay | Admitting: Internal Medicine

## 2015-08-27 ENCOUNTER — Ambulatory Visit (INDEPENDENT_AMBULATORY_CARE_PROVIDER_SITE_OTHER): Payer: Medicare Other | Admitting: Internal Medicine

## 2015-08-27 ENCOUNTER — Telehealth: Payer: Self-pay | Admitting: Internal Medicine

## 2015-08-27 VITALS — BP 118/54 | HR 76 | Ht 63.0 in | Wt 135.4 lb

## 2015-08-27 DIAGNOSIS — I2510 Atherosclerotic heart disease of native coronary artery without angina pectoris: Secondary | ICD-10-CM | POA: Diagnosis not present

## 2015-08-27 DIAGNOSIS — E785 Hyperlipidemia, unspecified: Secondary | ICD-10-CM | POA: Diagnosis not present

## 2015-08-27 DIAGNOSIS — I359 Nonrheumatic aortic valve disorder, unspecified: Secondary | ICD-10-CM | POA: Diagnosis not present

## 2015-08-27 NOTE — Progress Notes (Signed)
Cardiology Office Note   Date:  08/27/2015   ID:  Colleen, Foley [birth year], MRN [id number]  PCP:  Rogelia Boga, MD  Cardiologist:   Dietrich Pates, MD   No chief complaint on file.  F/U of CAD     History of Present Illness: [name] is a 81 y.o. female with a history of  Patient is a 81 yo with a hsitory of CAD and aortic aneurysm (s/p CABG and repair in Aug 2009) Also a history of DJD, HTN, RA and HL.   I saw her in 2013.    Since seen she has continued to have problems with balance Mostly in wheelchair. She denies CP  Breathing is OK     Current Outpatient Prescriptions  Medication Sig Dispense Refill  . acetaminophen (TYLENOL) 500 MG tablet Take 500 mg by mouth every 6 (six) hours as needed (for pain.).    Marland Kitchen aspirin EC 81 MG tablet Take 81 mg by mouth at bedtime.     . calcium carbonate (TUMS - DOSED IN MG ELEMENTAL CALCIUM) 500 MG chewable tablet Chew 1 tablet by mouth daily as needed for indigestion or heartburn.    . cetirizine (ZYRTEC) 10 MG tablet Take 10 mg by mouth daily as needed. Alternating with Hydroxyzine  For allergies    . cholecalciferol (VITAMIN D) 1000 UNITS tablet Take 1,000 Units by mouth daily.    Marland Kitchen doxycycline (VIBRA-TABS) 100 MG tablet Take 1 tablet (100 mg total) by mouth 2 (two) times daily. 20 tablet 0  . folic acid (FOLVITE) 1 MG tablet Take 1 mg by mouth 2 (two) times daily.    . furosemide (LASIX) 40 MG tablet Take 0.5-1 tablets (20-40 mg total) by mouth as needed. As needed for sweling (Patient taking differently: Take 20-40 mg by mouth daily as needed for fluid. As needed for sweling) 30 tablet 5  . hydrochlorothiazide (HYDRODIURIL) 25 MG tablet TAKE 1 TABLET BY MOUTH EVERY MORNING 90 tablet 1  . HYDROcodone-acetaminophen (NORCO/VICODIN) 5-325 MG per tablet Take 1 tablet by mouth every 6 (six) hours as needed for moderate pain. 60 tablet 0  . hydroxychloroquine (PLAQUENIL) 200 MG tablet Take 200 mg by mouth 2 (two) times  daily.     . hydrOXYzine (ATARAX/VISTARIL) 25 MG tablet Take 25 mg by mouth as needed. For allergies alternate with Zyrtec    . methotrexate (RHEUMATREX) 2.5 MG tablet Take 15 mg by mouth once a week. Caution:Chemotherapy. Protect from light. Take on Wednesday    . metoprolol succinate (TOPROL-XL) 50 MG 24 hr tablet TAKE 1 TABLET BY MOUTH EVERY DAY 30 tablet 5  . NASONEX 50 MCG/ACT nasal spray Place 2 sprays into the nose as needed.   5  . Pediatric Multivit-Minerals-C (COMPLETE MULTI-VITAMIN PO) Take 1 tablet by mouth daily.    . potassium chloride SA (K-DUR,KLOR-CON) 20 MEQ tablet Take 20 mEq by mouth See admin instructions. Take 1 tablet daily as needed for low potassium. (Only take when needed with the lasix)    . simvastatin (ZOCOR) 40 MG tablet TAKE 1 TABLET BY MOUTH AT BEDTIME 30 tablet 5   No current facility-administered medications for this visit.    Allergies:   Amoxicillin; Cyclobenzaprine hcl; Mupirocin; Penicillins; and Tetanus toxoid   Past Medical History  Diagnosis Date  . DEPRESSION 05/21/2007  . FIBROCYSTIC BREAST DISEASE, HX OF 05/21/2007  . HYPERLIPIDEMIA-MIXED 11/26/2008    takes Simvastatin daily  . Irritable bowel syndrome 05/21/2007  . KNEE PAIN  09/16/2007  . LEG EDEMA, BILATERAL 05/21/2009  . OSTEOPENIA 05/21/2007  . URTICARIA, CHRONIC 05/21/2007  . History of aortic aneurysm     resection and grafting of a 5.2-cm ascending aortic arch aneurysm August 2009  . Dyslipidemia   . Vitamin D deficiency   . Urticaria   . Neuropathy (HCC)     left radial  neuropathy with wrist drop  . Diverticulosis   . Irritable bowel syndrome   . GERD (gastroesophageal reflux disease)   . 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 Vaginal yeast infection     recently told to use monistat, hasn't started as of 01/26/2012  . HYPERTENSION 05/11/2008    takes Metoprolol and HCTZ daily  . Asthma     hx of;as a child  . Seasonal allergies     takes Hydroxyzine prn and Zyrtec 2  times a week  . Peripheral edema     takes Furosemide prn  . Pneumonia     history of;in the 90's  . Weakness     hands and feet  . Sciatica   . OSTEOARTHRITIS 01/20/2008    takes Methotrexate weekly  . DJD (degenerative joint disease)     history of   . Rheumatoid arthritis(714.0)   . Chronic back pain     takes Norco prn  . Bruises easily     takes Plaquenil daily  . CORONARY ATHEROSCLEROSIS, ARTERY BYPASS GRAFT 08/23/2008  . CAD (coronary artery disease)     s/p CABG -  x 1 with LIMA to LAD August 2009 /   . Urinary incontinence   . Bowel incontinence   . History of colon polyps   . Urinary frequency   . Urinary urgency   . Nocturia   . Anemia     takes folic acid bid  . Urinary incontinence     wears depends  . Hereditary and idiopathic peripheral neuropathy 05/17/2015    Past Surgical History  Procedure Laterality Date  . Appendectomy    . Cesarean section  1968    x 1  . Hernia repair    . Coronary artery bypass graft  August 2009    CABG x 1 with LIMA to LAD /  . Ascending aortic aneurysm repair  August 2009     resection and grafting of a 5.2-cm ascending aortic arch aneurysm  . Joint replacement      2009 & 2010- both knees  . Back surgery      2011- cerv. fusion   . Breast surgery      biopsy x2- R breast  . Tonsillectomy      as a child  . Cardiac catheterization  2009  . Colonoscopy    . Lumbar laminectomy/decompression microdiscectomy Bilateral 07/11/2013    Procedure: LUMBAR LAMINECTOMY/DECOMPRESSION MICRODISCECTOMY LUMBAR FIVE SACRAL ONE;  Surgeon: Mariam Dollar, MD;  Location: [facility];  Service: Neurosurgery;  Laterality: Bilateral;     Social History:  The patient  reports that she has never smoked. She has never used smokeless tobacco. She reports that she does not drink alcohol or use illicit drugs.   Family History:  The patient's family history includes Cancer in her maternal grandmother; Coronary artery disease in an other family  member; Heart disease in her mother and another family member; Stroke in her father and paternal grandmother. There is no history of Anesthesia problems, Hypotension, Malignant hyperthermia, or Pseudochol deficiency.    ROS:  Please see the history of present illness. All other systems are reviewed and  Negative to the above problem except as noted.    PHYSICAL EXAM: VS:  BP 118/54 mmHg  Pulse 76  Ht 5\' 3"  (1.6 m)  Wt 135 lb 6.4 oz (61.417 kg)  BMI 23.99 kg/m2  GEN: Well nourished, well developed, in no acute distress HEENT: normal Neck: no JVD, carotid bruits, or masses Cardiac: RRR; no murmurs, rubs, or gallops,no edema  Respiratory:  clear to auscultation bilaterally, normal work of breathing GI: soft, nontender, nondistended, + BS  No hepatomegaly  MS: no deformity Moving all extremities   Skin: warm and dry, no rash Neuro:  Strength and sensation are intact Psych: euthymic mood, full affect   EKG:  EKG is ordered today.  SR  76 bpm  Atrial bigeminy.     Lipid Panel    Component Value Date/Time   CHOL 132 01/27/2012 1626   TRIG 53.0 01/27/2012 1626   HDL 74.10 01/27/2012 1626   CHOLHDL 2 01/27/2012 1626   VLDL 10.6 01/27/2012 1626   LDLCALC 47 01/27/2012 1626   LDLDIRECT 127.4 05/18/2007 1033      Wt Readings from Last 3 Encounters:  08/27/15 135 lb 6.4 oz (61.417 kg)  06/25/15 142 lb (64.411 kg)  05/24/15 143 lb (64.864 kg)      ASSESSMENT AND PLAN:  1.  CAD  No symptoms of angina.  Continue meds  2.  Hx repair thoracic aneurysm.  Set up for echo  3.  HTN  Good control  4.  HL  Check lipids when comes back for echo    F/u in 1 year   Signed, 05/26/15, MD  08/27/2015 4:01 PM    [facility] [address] [place], [place], [place]  [place] Phone: [phone]; Fax: [phone]

## 2015-08-27 NOTE — Patient Instructions (Signed)
Your physician recommends that you continue on your current medications as directed. Please refer to the Current Medication list given to you today. Your physician recommends that you return for lab work when you have your ECHO done (CBC, BMET, LIPIDS, AST)  Your physician has requested that you have an echocardiogram. Echocardiography is a painless test that uses sound waves to create images of your heart. It provides your doctor with information about the size and shape of your heart and how well your heart's chambers and valves are working. This procedure takes approximately one hour. There are no restrictions for this procedure.  Your physician wants you to follow-up in: 1 YEAR WITH DR. Tenny Craw.  You will receive a reminder letter in the mail two months in advance. If you don't receive a letter, please call our office to schedule the follow-up appointment.

## 2015-08-27 NOTE — Telephone Encounter (Signed)
Pt request refill of the following: HYDROcodone-acetaminophen (NORCO/VICODIN) 5-325 MG per tablet ° ° °Phamacy: °

## 2015-08-28 MED ORDER — HYDROCODONE-ACETAMINOPHEN 5-325 MG PO TABS
1.0000 | ORAL_TABLET | Freq: Four times a day (QID) | ORAL | Status: DC | PRN
Start: 2015-08-28 — End: 2015-09-28

## 2015-08-28 NOTE — Telephone Encounter (Signed)
Left message on voicemail Rx ready for pickup will be at the front desk. Rx printed and signed.  

## 2015-08-30 ENCOUNTER — Ambulatory Visit (INDEPENDENT_AMBULATORY_CARE_PROVIDER_SITE_OTHER): Payer: Medicare Other

## 2015-08-30 DIAGNOSIS — Z23 Encounter for immunization: Secondary | ICD-10-CM | POA: Diagnosis not present

## 2015-09-11 ENCOUNTER — Encounter: Payer: Self-pay | Admitting: Neurology

## 2015-09-11 ENCOUNTER — Ambulatory Visit (INDEPENDENT_AMBULATORY_CARE_PROVIDER_SITE_OTHER): Payer: Medicare Other | Admitting: Neurology

## 2015-09-11 VITALS — BP 145/51 | HR 62 | Ht 62.0 in

## 2015-09-11 DIAGNOSIS — G609 Hereditary and idiopathic neuropathy, unspecified: Secondary | ICD-10-CM

## 2015-09-11 DIAGNOSIS — R269 Unspecified abnormalities of gait and mobility: Secondary | ICD-10-CM

## 2015-09-11 DIAGNOSIS — R2681 Unsteadiness on feet: Secondary | ICD-10-CM

## 2015-09-11 NOTE — Patient Instructions (Signed)
Fall Prevention in the Home  Falls can cause injuries and can affect people from all age groups. There are many simple things that you can do to make your home safe and to help prevent falls. WHAT CAN I DO ON THE OUTSIDE OF MY HOME?  Regularly repair the edges of walkways and driveways and fix any cracks.  Remove high doorway thresholds.  Trim any shrubbery on the main path into your home.  Use bright outdoor lighting.  Clear walkways of debris and clutter, including tools and rocks.  Regularly check that handrails are securely fastened and in good repair. Both sides of any steps should have handrails.  Install guardrails along the edges of any raised decks or porches.  Have leaves, snow, and ice cleared regularly.  Use sand or salt on walkways during winter months.  In the garage, clean up any spills right away, including grease or oil spills. WHAT CAN I DO IN THE BATHROOM?  Use night lights.  Install grab bars by the toilet and in the tub and shower. Do not use towel bars as grab bars.  Use non-skid mats or decals on the floor of the tub or shower.  If you need to sit down while you are in the shower, use a plastic, non-slip stool..  Keep the floor dry. Immediately clean up any water that spills on the floor.  Remove soap buildup in the tub or shower on a regular basis.  Attach bath mats securely with double-sided non-slip rug tape.  Remove throw rugs and other tripping hazards from the floor. WHAT CAN I DO IN THE BEDROOM?  Use night lights.  Make sure that a bedside light is easy to reach.  Do not use oversized bedding that drapes onto the floor.  Have a firm chair that has side arms to use for getting dressed.  Remove throw rugs and other tripping hazards from the floor. WHAT CAN I DO IN THE KITCHEN?   Clean up any spills right away.  Avoid walking on wet floors.  Place frequently used items in easy-to-reach places.  If you need to reach for something  above you, use a sturdy step stool that has a grab bar.  Keep electrical cables out of the way.  Do not use floor polish or wax that makes floors slippery. If you have to use wax, make sure that it is non-skid floor wax.  Remove throw rugs and other tripping hazards from the floor. WHAT CAN I DO IN THE STAIRWAYS?  Do not leave any items on the stairs.  Make sure that there are handrails on both sides of the stairs. Fix handrails that are broken or loose. Make sure that handrails are as long as the stairways.  Check any carpeting to make sure that it is firmly attached to the stairs. Fix any carpet that is loose or worn.  Avoid having throw rugs at the top or bottom of stairways, or secure the rugs with carpet tape to prevent them from moving.  Make sure that you have a light switch at the top of the stairs and the bottom of the stairs. If you do not have them, have them installed. WHAT ARE SOME OTHER FALL PREVENTION TIPS?  Wear closed-toe shoes that fit well and support your feet. Wear shoes that have rubber soles or low heels.  When you use a stepladder, make sure that it is completely opened and that the sides are firmly locked. Have someone hold the ladder while you   are using it. Do not climb a closed stepladder.  Add color or contrast paint or tape to grab bars and handrails in your home. Place contrasting color strips on the first and last steps.  Use mobility aids as needed, such as canes, walkers, scooters, and crutches.  Turn on lights if it is dark. Replace any light bulbs that burn out.  Set up furniture so that there are clear paths. Keep the furniture in the same spot.  Fix any uneven floor surfaces.  Choose a carpet design that does not hide the edge of steps of a stairway.  Be aware of any and all pets.  Review your medicines with your healthcare provider. Some medicines can cause dizziness or changes in blood pressure, which increase your risk of falling. Talk  with your health care provider about other ways that you can decrease your risk of falls. This may include working with a physical therapist or trainer to improve your strength, balance, and endurance.   This information is not intended to replace advice given to you by your health care provider. Make sure you discuss any questions you have with your health care provider.   Document Released: 10/31/2002 Document Revised: 03/27/2015 Document Reviewed: 12/15/2014 Elsevier Interactive Patient Education 2016 [employer]  

## 2015-09-11 NOTE — Progress Notes (Signed)
Reason for visit: Gait disorder  [name] is an 81 y.o. female  History of present illness:  [name] is an 81 year old right-handed white female with a history of a progressive gait disorder. The patient has undergone a workup that has revealed evidence of significant cervical spondylosis, there is some spinal stenosis at the C3-4 level with some deformation of the spinal cord, no increased cord signal is seen. The patient has reported some increased bladder frequency and some incontinence. The patient has developed some discomfort into the shoulders, right greater than left, and some tingling down the left arm. The patient has a tendency to lean backwards with walking, and she will occasionally have crossover of one leg that results in a fall. The patient is falling relatively frequently, even with a walker. She is being evaluated in the next several days for a power wheelchair to help improve safety in the home environment. The patient has had nerve conduction studies that show evidence of a moderate to severe severity peripheral neuropathy with distal denervation. She has a history of prior cervical spine surgery and a history of lumbosacral spinal stenosis with prior surgery. She returns for an evaluation.    Past Medical History  Diagnosis Date  . DEPRESSION 05/21/2007  . FIBROCYSTIC BREAST DISEASE, HX OF 05/21/2007  . HYPERLIPIDEMIA-MIXED 11/26/2008    takes Simvastatin daily  . Irritable bowel syndrome 05/21/2007  . KNEE PAIN 09/16/2007  . LEG EDEMA, BILATERAL 05/21/2009  . OSTEOPENIA 05/21/2007  . URTICARIA, CHRONIC 05/21/2007  . History of aortic aneurysm     resection and grafting of a 5.2-cm ascending aortic arch aneurysm August 2009  . Dyslipidemia   . Vitamin D deficiency   . Urticaria   . Neuropathy (HCC)     left radial  neuropathy with wrist drop  . Diverticulosis   . Irritable bowel syndrome   . GERD (gastroesophageal reflux disease)   . 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 Vaginal yeast infection     recently told to use monistat, hasn't started as of 01/26/2012  . HYPERTENSION 05/11/2008    takes Metoprolol and HCTZ daily  . Asthma     hx of;as a child  . Seasonal allergies     takes Hydroxyzine prn and Zyrtec 2 times a week  . Peripheral edema     takes Furosemide prn  . Pneumonia     history of;in the 90's  . Weakness     hands and feet  . Sciatica   . OSTEOARTHRITIS 01/20/2008    takes Methotrexate weekly  . DJD (degenerative joint disease)     history of   . Rheumatoid arthritis(714.0)   . Chronic back pain     takes Norco prn  . Bruises easily     takes Plaquenil daily  . CORONARY ATHEROSCLEROSIS, ARTERY BYPASS GRAFT 08/23/2008  . CAD (coronary artery disease)     s/p CABG -  x 1 with LIMA to LAD August 2009 /   . Urinary incontinence   . Bowel incontinence   . History of colon polyps   . Urinary frequency   . Urinary urgency   . Nocturia   . Anemia     takes folic acid bid  . Urinary incontinence     wears depends  . Hereditary and idiopathic peripheral neuropathy 05/17/2015    Past Surgical History  Procedure Laterality Date  . Appendectomy    . Cesarean  section  1968    x 1  . Hernia repair    . Coronary artery bypass graft  August 2009    CABG x 1 with LIMA to LAD /  . Ascending aortic aneurysm repair  August 2009     resection and grafting of a 5.2-cm ascending aortic arch aneurysm  . Joint replacement      2009 & 2010- both knees  . Back surgery      2011- cerv. fusion   . Breast surgery      biopsy x2- R breast  . Tonsillectomy      as a child  . Cardiac catheterization  2009  . Colonoscopy    . Lumbar laminectomy/decompression microdiscectomy Bilateral 07/11/2013    Procedure: LUMBAR LAMINECTOMY/DECOMPRESSION MICRODISCECTOMY LUMBAR FIVE SACRAL ONE;  Surgeon: Mariam Dollar, MD;  Location: [facility];  Service: Neurosurgery;  Laterality: Bilateral;    Family History  Problem Relation Age of  Onset  . Heart disease    . Coronary artery disease    . Anesthesia problems Neg Hx   . Hypotension Neg Hx   . Malignant hyperthermia Neg Hx   . Pseudochol deficiency Neg Hx   . Heart disease Mother   . Stroke Father   . Cancer Maternal Grandmother   . Stroke Paternal Grandmother     Social history:  reports that she has never smoked. She has never used smokeless tobacco. She reports that she does not drink alcohol or use illicit drugs.    Allergies  Allergen Reactions  . Amoxicillin     REACTION: unspecified  . Cyclobenzaprine Hcl     REACTION: rash  . Mupirocin     Unknown reaction  . Penicillins Hives  . Tetanus Toxoid Hives    Medications:  Prior to Admission medications   Medication Sig Start Date End Date Taking? Authorizing Provider  acetaminophen (TYLENOL) 500 MG tablet Take 500 mg by mouth every 6 (six) hours as needed (for pain.).   Yes Historical Provider, MD  aspirin EC 81 MG tablet Take 81 mg by mouth at bedtime.    Yes Historical Provider, MD  calcium carbonate (TUMS - DOSED IN MG ELEMENTAL CALCIUM) 500 MG chewable tablet Chew 1 tablet by mouth daily as needed for indigestion or heartburn.   Yes Historical Provider, MD  cetirizine (ZYRTEC) 10 MG tablet Take 10 mg by mouth daily as needed. Alternating with Hydroxyzine  For allergies   Yes Historical Provider, MD  cholecalciferol (VITAMIN D) 1000 UNITS tablet Take 1,000 Units by mouth daily.   Yes Historical Provider, MD  doxycycline (VIBRA-TABS) 100 MG tablet Take 1 tablet (100 mg total) by mouth 2 (two) times daily. 06/25/15  Yes Gordy Savers, MD  folic acid (FOLVITE) 1 MG tablet Take 1 mg by mouth 2 (two) times daily.   Yes Historical Provider, MD  furosemide (LASIX) 40 MG tablet Take 0.5-1 tablets (20-40 mg total) by mouth as needed. As needed for sweling Patient taking differently: Take 20-40 mg by mouth daily as needed for fluid. As needed for sweling 08/28/14  Yes Gordy Savers, MD    hydrochlorothiazide (HYDRODIURIL) 25 MG tablet TAKE 1 TABLET BY MOUTH EVERY MORNING 06/18/15  Yes Gordy Savers, MD  HYDROcodone-acetaminophen (NORCO/VICODIN) 5-325 MG tablet Take 1 tablet by mouth every 6 (six) hours as needed for moderate pain. 08/28/15  Yes Gordy Savers, MD  hydroxychloroquine (PLAQUENIL) 200 MG tablet Take 200 mg by mouth 2 (two) times daily.  Yes Historical Provider, MD  hydrOXYzine (ATARAX/VISTARIL) 25 MG tablet Take 25 mg by mouth as needed. For allergies alternate with Zyrtec   Yes Historical Provider, MD  methotrexate (RHEUMATREX) 2.5 MG tablet Take 15 mg by mouth once a week. Caution:Chemotherapy. Protect from light. Take on Wednesday   Yes Historical Provider, MD  metoprolol succinate (TOPROL-XL) 50 MG 24 hr tablet TAKE 1 TABLET BY MOUTH EVERY DAY 05/11/15  Yes Gordy Savers, MD  NASONEX 50 MCG/ACT nasal spray Place 2 sprays into the nose as needed.  06/22/15  Yes Historical Provider, MD  Pediatric Multivit-Minerals-C (COMPLETE MULTI-VITAMIN PO) Take 1 tablet by mouth daily.   Yes Historical Provider, MD  potassium chloride SA (K-DUR,KLOR-CON) 20 MEQ tablet Take 20 mEq by mouth See admin instructions. Take 1 tablet daily as needed for low potassium. (Only take when needed with the lasix)   Yes Historical Provider, MD  simvastatin (ZOCOR) 40 MG tablet TAKE 1 TABLET BY MOUTH AT BEDTIME 02/22/15  Yes Gordy Savers, MD    ROS:  Out of a complete 14 system review of symptoms, the patient complains only of the following symptoms, and all other reviewed systems are negative.  Gait disturbance, falls Urinary incontinence  Blood pressure 145/51, pulse 62, height 5\' 2"  (1.575 m).  Physical Exam  General: The patient is alert and cooperative at the time of the examination.  Skin: No significant peripheral edema is noted.   Neurologic Exam  Mental status: The patient is alert and oriented x 3 at the time of the examination. The patient has  apparent normal recent and remote memory, with an apparently normal attention span and concentration ability.   Cranial nerves: Facial symmetry is present. Speech is normal, no aphasia or dysarthria is noted. Extraocular movements are full. Visual fields are full.  Motor: The patient has good strength in all 4 extremities, with the exception that the left triceps muscle is 4/5 strength.  Sensory examination: Soft touch sensation is symmetric on the face, arms, and legs.  Coordination: The patient has good finger-nose-finger and heel-to-shin bilaterally.  Gait and station: The patient has an unsteady gait, tends to lean backwards. Unable to perform tandem gait. Rhomberg is positive.  Reflexes: Deep tendon reflexes are symmetric.   Assessment/Plan:  1. Gait disorder  2. Peripheral neuropathy  3. Cervical spinal stenosis, C3-4 level  The patient likely has a multifactorial gait disorder. The patient has significant cervical and lumbar spine disease, the patient has some impingement of the cervical cord without cord signal. The patient also has a peripheral neuropathy of moderate to severe severity which may be leading some gait instability issues. It is possible that the patient may have some other overlying neurodegenerative process such as PSP. The patient is high risk for falls, she will be evaluated for a power wheelchair in the near future. I will get her set up for physical therapy for gait training. The patient will follow-up in 5 or 6 months through this office.  MD 09/11/2015 7:49 PM  [facility] [address] [place], [place]  Phone [phone] Fax [phone]

## 2015-09-14 ENCOUNTER — Other Ambulatory Visit: Payer: Self-pay

## 2015-09-14 ENCOUNTER — Ambulatory Visit (HOSPITAL_COMMUNITY): Payer: Medicare Other | Attending: Cardiovascular Disease

## 2015-09-14 ENCOUNTER — Other Ambulatory Visit (INDEPENDENT_AMBULATORY_CARE_PROVIDER_SITE_OTHER): Payer: Medicare Other | Admitting: *Deleted

## 2015-09-14 DIAGNOSIS — I359 Nonrheumatic aortic valve disorder, unspecified: Secondary | ICD-10-CM

## 2015-09-14 DIAGNOSIS — I351 Nonrheumatic aortic (valve) insufficiency: Secondary | ICD-10-CM | POA: Diagnosis not present

## 2015-09-14 DIAGNOSIS — I10 Essential (primary) hypertension: Secondary | ICD-10-CM | POA: Insufficient documentation

## 2015-09-14 DIAGNOSIS — I071 Rheumatic tricuspid insufficiency: Secondary | ICD-10-CM | POA: Diagnosis not present

## 2015-09-14 DIAGNOSIS — E785 Hyperlipidemia, unspecified: Secondary | ICD-10-CM | POA: Insufficient documentation

## 2015-09-14 DIAGNOSIS — I340 Nonrheumatic mitral (valve) insufficiency: Secondary | ICD-10-CM | POA: Insufficient documentation

## 2015-09-14 DIAGNOSIS — Z951 Presence of aortocoronary bypass graft: Secondary | ICD-10-CM | POA: Diagnosis not present

## 2015-09-14 DIAGNOSIS — I2510 Atherosclerotic heart disease of native coronary artery without angina pectoris: Secondary | ICD-10-CM

## 2015-09-14 DIAGNOSIS — I517 Cardiomegaly: Secondary | ICD-10-CM | POA: Diagnosis not present

## 2015-09-14 LAB — BASIC METABOLIC PANEL
BUN: 15 mg/dL (ref 7–25)
CHLORIDE: 103 mmol/L (ref 98–110)
CO2: 30 mmol/L (ref 20–31)
Calcium: 8.9 mg/dL (ref 8.6–10.4)
Creat: 0.54 mg/dL — ABNORMAL LOW (ref 0.60–0.88)
Glucose, Bld: 82 mg/dL (ref 65–99)
POTASSIUM: 3.8 mmol/L (ref 3.5–5.3)
SODIUM: 142 mmol/L (ref 135–146)

## 2015-09-14 LAB — LIPID PANEL
Cholesterol: 120 mg/dL — ABNORMAL LOW (ref 125–200)
HDL: 63 mg/dL (ref >=46)
LDL CALC: 48 mg/dL (ref <130)
TRIGLYCERIDES: 47 mg/dL (ref <150)
Total CHOL/HDL Ratio: 1.9 Ratio (ref <=5.0)
VLDL: 9 mg/dL (ref <30)

## 2015-09-14 LAB — CBC WITH DIFFERENTIAL/PLATELET
BASOS PCT: 0 % (ref 0–1)
Basophils Absolute: 0 10*3/uL (ref 0.0–0.1)
EOS ABS: 0.1 10*3/uL (ref 0.0–0.7)
Eosinophils Relative: 2 % (ref 0–5)
HCT: 31.6 % — ABNORMAL LOW (ref 36.0–46.0)
HEMOGLOBIN: 10.8 g/dL — AB (ref 12.0–15.0)
Lymphocytes Relative: 15 % (ref 12–46)
Lymphs Abs: 0.9 10*3/uL (ref 0.7–4.0)
MCH: 28.7 pg (ref 26.0–34.0)
MCHC: 34.2 g/dL (ref 30.0–36.0)
MCV: 84 fL (ref 78.0–100.0)
MONO ABS: 0.5 10*3/uL (ref 0.1–1.0)
MONOS PCT: 8 % (ref 3–12)
MPV: 9.3 fL (ref 8.6–12.4)
NEUTROS ABS: 4.4 10*3/uL (ref 1.7–7.7)
Neutrophils Relative %: 75 % (ref 43–77)
Platelets: 240 10*3/uL (ref 150–400)
RBC: 3.76 MIL/uL — ABNORMAL LOW (ref 3.87–5.11)
RDW: 17 % — ABNORMAL HIGH (ref 11.5–15.5)
WBC: 5.8 10*3/uL (ref 4.0–10.5)

## 2015-09-14 LAB — AST: AST: 28 U/L (ref 10–35)

## 2015-09-20 ENCOUNTER — Ambulatory Visit: Payer: Medicare Other | Attending: Internal Medicine | Admitting: Physical Therapy

## 2015-09-20 ENCOUNTER — Encounter: Payer: Self-pay | Admitting: Physical Therapy

## 2015-09-20 DIAGNOSIS — R2681 Unsteadiness on feet: Secondary | ICD-10-CM | POA: Insufficient documentation

## 2015-09-20 DIAGNOSIS — R531 Weakness: Secondary | ICD-10-CM | POA: Diagnosis present

## 2015-09-20 DIAGNOSIS — R269 Unspecified abnormalities of gait and mobility: Secondary | ICD-10-CM

## 2015-09-20 NOTE — Therapy (Signed)
[place] [address] [place], [place], [address] Phone: [phone]   Fax:  [phone]  Physical Therapy Evaluation  Patient Details  Name: [name] MRN: [id number] Date of Birth: 02-07-1932 Referring Provider: Bluford Kaufmann, MD  Encounter Date: 09/20/2015      PT End of Session - 09/20/15 1201    Visit Number 1   Number of Visits 1   PT Start Time 0935   PT Stop Time 1058   PT Time Calculation (min) 83 min   Equipment Utilized During Treatment Gait belt   Activity Tolerance Patient tolerated treatment well   Behavior During Therapy [facility] for tasks assessed/performed      Past Medical History  Diagnosis Date  . DEPRESSION 05/21/2007  . FIBROCYSTIC BREAST DISEASE, HX OF 05/21/2007  . HYPERLIPIDEMIA-MIXED 11/26/2008    takes Simvastatin daily  . Irritable bowel syndrome 05/21/2007  . KNEE PAIN 09/16/2007  . LEG EDEMA, BILATERAL 05/21/2009  . OSTEOPENIA 05/21/2007  . URTICARIA, CHRONIC 05/21/2007  . History of aortic aneurysm     resection and grafting of a 5.2-cm ascending aortic arch aneurysm August 2009  . Dyslipidemia   . Vitamin D deficiency   . Urticaria   . Neuropathy (HCC)     left radial  neuropathy with wrist drop  . Diverticulosis   . Irritable bowel syndrome   . GERD (gastroesophageal reflux disease)   . 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 Vaginal yeast infection     recently told to use monistat, hasn't started as of 01/26/2012  . HYPERTENSION 05/11/2008    takes Metoprolol and HCTZ daily  . Asthma     hx of;as a child  . Seasonal allergies     takes Hydroxyzine prn and Zyrtec 2 times a week  . Peripheral edema     takes Furosemide prn  . Pneumonia     history of;in the 90's  . Weakness     hands and feet  . Sciatica   . OSTEOARTHRITIS 01/20/2008    takes Methotrexate weekly  . DJD (degenerative joint disease)     history of   . Rheumatoid arthritis(714.0)   .  Chronic back pain     takes Norco prn  . Bruises easily     takes Plaquenil daily  . CORONARY ATHEROSCLEROSIS, ARTERY BYPASS GRAFT 08/23/2008  . CAD (coronary artery disease)     s/p CABG -  x 1 with LIMA to LAD August 2009 /   . Urinary incontinence   . Bowel incontinence   . History of colon polyps   . Urinary frequency   . Urinary urgency   . Nocturia   . Anemia     takes folic acid bid  . Urinary incontinence     wears depends  . Hereditary and idiopathic peripheral neuropathy 05/17/2015    Past Surgical History  Procedure Laterality Date  . Appendectomy    . Cesarean section  1968    x 1  . Hernia repair    . Coronary artery bypass graft  August 2009    CABG x 1 with LIMA to LAD /  . Ascending aortic aneurysm repair  August 2009     resection and grafting of a 5.2-cm ascending aortic arch aneurysm  . Joint replacement      2009 & 2010- both knees  . Back surgery      2011- cerv. fusion   .  Breast surgery      biopsy x2- R breast  . Tonsillectomy      as a child  . Cardiac catheterization  2009  . Colonoscopy    . Lumbar laminectomy/decompression microdiscectomy Bilateral 07/11/2013    Procedure: LUMBAR LAMINECTOMY/DECOMPRESSION MICRODISCECTOMY LUMBAR FIVE SACRAL ONE;  Surgeon: Elaina Hoops, MD;  Location: [facility];  Service: Neurosurgery;  Laterality: Bilateral;    There were no vitals filed for this visit.  Visit Diagnosis:  Abnormality of gait  Unsteadiness  Weakness generalized      Subjective Assessment - 09/20/15 0943    Subjective This 81yo female presents for PT power wheelchair evaluation.   Patient is accompained by: Family member   Patient Stated Goals To get around in home and stop falls.   Currently in Pain? Yes   Pain Score 3   in last week, worst 5/10, best 1/10   Pain Location Other (Comment)  arthritic pain all over-shoulder, neck, back   Pain Descriptors / Indicators Aching   Pain Onset More than a month ago   Pain Frequency  Constant   Aggravating Factors  push w/c, using walker   Pain Relieving Factors stretching   Multiple Pain Sites No       Mobility/Seating Evaluation    PATIENT INFORMATION: Name: [name] DOB: 1932-04-17  Sex: Female Date seen: 09/20/2015 Time: 9:30  Address:  [address] [place], [place] [address] Physician: Royetta Car, MD This evaluation/justification form will serve as the LMN for the following suppliers: __________________________ Supplier: [employer] Contact Person: Luz Brazen, ATP????? Phone:  [phone]   Seating Therapist: Jamey Reas, PT, DPT Phone:   [phone]   Phone: [phone]    Spouse/Parent/Caregiver name: Tysheena Ginzburg, son  Phone number: [phone] Insurance/Payer: [facility] Medicare     Reason for Referral: To get power w/c  Patient/Caregiver Goals: Get around house, stop falls, protect further deteoration of arthritic joints.  Patient was seen for face-to-face evaluation for new power wheelchair.  Also present was Jaskiran Pata, son & Luz Brazen, ATP to discuss recommendations and wheelchair options.  Further paperwork was completed and sent to vendor.  Patient appears to qualify for power mobility device at this time per objective findings.   MEDICAL HISTORY: Diagnosis: Primary Diagnosis: Rheumatoid & Osteo Artthritis Onset: 2009 diagnosed  Diagnosis: Osteopenia, Herediatry Idiopathic peripheral neuropathy, CAD   '[x]' Progressive Disease Relevant past and future surgeries: CABG '09, aortic aneurysm repair '09, Total Knee Replacement bilateral '09 & '10, Cervical fusion 2011, lumbar laminectomy 2014   Height: 5'2" Weight: 153# Explain recent changes or trends in weight: in last year she has lost >25#   History including Falls: She reports falls daily 2-3 times/day. She has had stitches in 3 emergency room visits and multiple bruises.     HOME ENVIRONMENT: '[x]' House  '[]' Condo/town home  '[]' Apartment  '[]' Assisted Living    '[x]' Lives Alone  '[]'  Lives with Others                                                                                          Hours with caregiver: 5-10 hours /wk  '[x]' Home is  accessible to patient           Stairs      '[x]' Yes '[]'  No     Ramp '[]' Yes '[x]' No Comments:  Her house is single level with single step entrance from garage. Family plans to get a ramp.   COMMUNITY ADL: TRANSPORTATION: '[x]' Car    '[]' Van    '[]' Public Transportation    '[]' Adapted w/c Lift    '[]' Ambulance    '[]' Other:       '[]' Sits in wheelchair during transport  Employment/School: ????? Specific requirements pertaining to mobility ?????  Other: ?????    FUNCTIONAL/SENSORY PROCESSING SKILLS:  Handedness:   '[x]' Right     '[]' Left    '[]' NA  Comments:  ?????  Functional Processing Skills for Wheeled Mobility '[x]' Processing Skills are adequate for safe wheelchair operation  Areas of concern than may interfere with safe operation of wheelchair Description of problem   '[]'  Attention to environment      '[]' Judgment      '[]'  Hearing  '[]'  Vision or visual processing      '[]' Motor Planning  '[]'  Fluctuations in Behavior  ?????    VERBAL COMMUNICATION: '[x]' WFL receptive '[x]'  WFL expressive '[x]' Understandable  '[]' Difficult to understand  '[]' non-communicative '[]'  Uses an augmented communication device  CURRENT SEATING / MOBILITY: Current Mobility Base:  '[]' None '[x]' Dependent '[x]' Manual '[]' Scooter '[]' Power  Type of Control: ?????  Manufacturer:  [employer] w/cSize:  19" X 18"Age: 6 months paid out of pocket  Current Condition of Mobility Base:  good   Current Wheelchair components:  Transport w/c with swing away footrest.  Describe posture in present seating system:  Kyphotic sacral sitting, head forward, rounded shoulders, knee adduction.      SENSATION and SKIN ISSUES: Sensation '[x]' Intact  '[]' Impaired '[]' Absent  Level of sensation: ????? Pressure Relief: Able to perform effective pressure relief :    '[]' Yes  '[x]'  No Method: ???? If not, Why?: UEs too weak to lift  high enough to be effective and only <10 seconds.  Skin Issues/Skin Integrity Current Skin Issues  '[x]' Yes '[]' No '[]' Intact '[x]'  Red area'[]'  Open Area  '[]' Scar Tissue '[x]' At risk from prolonged sitting Where  ?????  History of Skin Issues  '[]' Yes '[x]' No Where  ????? When  ?????  Hx of skin flap surgeries  '[]' Yes '[x]' No Where  ????? When  ?????  Limited sitting tolerance '[]' Yes '[x]' No Hours spent sitting in wheelchair daily: >12 hours /day  Complaint of Pain:  Please describe: Arthritis pain in shoulders, neck, back ranging 1-6/10 with medication  and limiting standing time   Swelling/Edema: bilateral LEs knee distally   ADL STATUS (in reference to wheelchair use):  Indep Assist Unable Indep with Equip Not assessed Comments  Dressing X ????? ????? ????? ????? Sitting for all dressing  Eating X ????? ????? ????? ????? ?????  Toileting ????? ????? ????? X ????? raised commode seat and some grab bars  Bathing ????? ????? ????? X ????? sits on seat & grab bars  Grooming/Hygiene X ????? ????? ????? ????? sits  Meal Prep X ????? ????? ????? ????? using microwave  IADLS ????? X ????? ????? ????? Son is managing finances  Bowel Management: '[]' Continent  '[]' Incontinent  '[x]' Accidents Comments:  takes too long to get to bathroom  Bladder Management: '[]' Continent  '[x]' Incontinent  '[x]' Accidents Comments:  stress & urgency incontinence, takes too long to get to bathroom     WHEELCHAIR SKILLS: Manual w/c Propulsion: '[]' UE or LE strength and endurance sufficient to participate in ADLs using manual wheelchair Arm : '[]'   left '[]' right   '[]' Both      Distance: ????? Foot:  '[]' left '[]' right   '[]' Both  Operate Scooter: '[]'  Strength, hand grip, balance and transfer appropriate for use '[]' Living environment is accessible for use of scooter  Operate Power w/c:  '[x]'  Std. Joystick   '[]'  Alternative Controls Indep '[x]'  Assist '[]'  Dependent/unable '[]'  N/A '[]'   '[x]' Safe          '[x]'  Functional      Distance: ?????  Bed confined without  wheelchair '[x]'  Yes '[]'  No   STRENGTH/RANGE OF MOTION:  Seated Passive Range of Motion Strength  Shoulder Flexion bilaterally 100* Bil. 4/5  Elbow WFL left triceps 4-/5, bil. biceps 5/5  Wrist/Hand WFL  left grip 40#, right grip 28#  Hip flexion WFL abduction & extension bil. 3-/5, flexion 3+/5  Knee WFL extension bil. 4/5, flexion 3+/5  Ankle WFL dorsiflexion bil. 5/5     MOBILITY/BALANCE:  '[]'  Patient is totally dependent for mobility  ?????    Balance Transfers Ambulation  Sitting Balance: Standing Balance: '[]'  Independent '[]'  Independent/Modified Independent  '[x]'  WFL     '[]'  WFL '[]'  Supervision '[]'  Supervision  '[]'  Uses UE for balance  '[]'  Supervision '[]'  Min Assist '[]'  Ambulates with Assist  ?????    '[]'  Min Assist '[x]'  Min assist '[x]'  Mod Assist '[x]'  Ambulates with Device:      '[x]'  RW  '[]'  StW  '[]'  Cane  '[]'  moderate assist  '[]'  Mod Assist '[]'  Mod assist '[]'  Max assist   '[]'  Max Assist '[]'  Max assist '[]'  Dependent '[]'  Indep. Short Distance Only  '[]'  Unable '[]'  Unable '[]'  Lift / Sling Required Distance (in feet)  ?????   '[]'  Sliding board '[]'  Unable to Ambulate (see explanation below)  Cardio Status:  '[x]' Intact  '[]'  Impaired   '[]'  NA     Heart Rate pre-activity 68, post-activity 71bpm  Respiratory Status:  '[x]' Intact   '[]' Impaired   '[]' NA     Oxygen saturation pre-activity 98%, post-activity 97%  Orthotics/Prosthetics: none  Comments (Address manual vs power w/c vs scooter): Timed Up & Go with rolling walker with moderate assist for safety = 40.35sec which is not functional nor safe. Patient performed stand-pivot transfer between 2 wheelchairs with minimal assist with significant instability especially in her hips. Patient propelled manual w/c 30' but was slow and not functional. Repetitive w/c propulsion would exerbate upper body arthritic issues.          Anterior / Posterior Obliquity Rotation-Pelvis ?????  PELVIS    '[]'  '[x]'  '[]'   Neutral Posterior Anterior  '[x]'  '[]'  '[]'   WFL Rt elev Lt elev   '[x]'  '[]'  '[]'   WFL Right Left                      Anterior    Anterior     '[]'  Fixed '[]'  Other '[x]'  Partly Flexible '[]'  Flexible   '[]'  Fixed '[]'  Other '[x]'  Partly Flexible  '[]'  Flexible  '[]'  Fixed '[]'  Other '[x]'  Partly Flexible  '[]'  Flexible   TRUNK  '[]'  '[x]'  '[]'   WFL ? Thoracic ? Lumbar  Kyphosis Lordosis  '[x]'  '[]'  '[]'   WFL Convex Convex  Right Left '[]' c-curve '[]' s-curve '[]' multiple  '[]'  Neutral '[]'  Left-anterior '[x]'  Right-anterior     '[x]'  Fixed '[]'  Flexible '[]'  Partly Flexible '[]'  Other  '[]'  Fixed '[]'  Flexible '[x]'  Partly Flexible '[]'  Other  '[]'  Fixed             '[]'  Flexible '[x]'  Partly  Flexible '[]'  Other    Position Windswept  ?????  HIPS          '[x]'            '[]'               '[]'    Neutral       Abduct        ADduct         '[x]'           '[]'            '[]'   Neutral Right           Left      '[]'  Fixed '[]'  Subluxed '[x]'  Partly Flexible '[]'  Dislocated '[]'  Flexible  '[]'  Fixed '[]'  Other '[x]'  Partly Flexible  '[]'  Flexible                 Foot Positioning Knee Positioning  left foot toes-in    '[x]'  WFL  '[]' Lt '[]' Rt '[x]'  WFL  '[]' Lt '[]' Rt    KNEES ROM concerns: ROM concerns:    & Dorsi-Flexed '[]' Lt '[]' Rt ?????    FEET Plantar Flexed '[]' Lt '[]' Rt      Inversion                 '[]' Lt '[]' Rt      Eversion                 '[]' Lt '[]' Rt     HEAD '[x]'  Functional '[x]'  Good Head Control  ?????  & '[]'  Flexed         '[]'  Extended '[]'  Adequate Head Control    NECK '[]'  Rotated  Lt  '[]'  Lat Flexed Lt '[]'  Rotated  Rt '[]'  Lat Flexed Rt '[]'  Limited Head Control     '[]'  Cervical Hyperextension '[]'  Absent  Head Control     SHOULDERS ELBOWS WRIST& HAND ?????      Left     Right    Left     Right    Left     Right   U/E '[x]' Functional           '[x]' Functional [facility] WFL '[]' Fisting             '[]' Fisting      '[]' elev   '[]' dep      '[]' elev   '[]' dep       '[]' pro -'[]' retract     '[]' pro  '[]' retract '[]' subluxed             '[]' subluxed           Goals for Wheelchair Mobility  '[x]'  Independence with mobility in the home with motor related ADLs (MRADLs)  '[x]'  Independence with  MRADLs in the community '[]'  Provide dependent mobility  '[]'  Provide recline     '[x]' Provide tilt   Goals for Seating system '[x]'  Optimize pressure distribution '[x]'  Provide support needed to facilitate function or safety '[x]'  Provide corrective forces to assist with maintaining or improving posture '[x]'  Accommodate client's posture:   current seated postures and positions are not flexible or will not tolerate corrective forces '[x]'  Client to be independent with relieving pressure in the wheelchair '[]' Enhance physiological function such as breathing, swallowing, digestion  Simulation ideas/Equipment trials:group 3 power w/c State why other equipment was unsuccessful:?????   MOBILITY BASE RECOMMENDATIONS and JUSTIFICATION: MOBILITY COMPONENT JUSTIFICATION  Manufacturer: [employer] Model: Pulus 6   Size: Width 18"Seat Depth 18" '[x]' provide transport from point A to B      '[x]' promote Indep mobility  [  x]is not a safe, functional ambulator '[x]' walker or cane inadequate '[]' non-standard width/depth necessary to accommodate anatomical measurement '[]'  ?????  '[]' Manual Mobility Base '[]' non-functional ambulator    '[]' Scooter/POV  '[]' can safely operate  '[]' can safely transfer   '[]' has adequate trunk stability  '[]' cannot functionally propel manual w/c  '[x]' Power Mobility Base  '[x]' non-ambulatory  '[x]' cannot functionally propel manual wheelchair  '[x]'  cannot functionally and safely operate scooter/POV '[x]' can safely operate and willing to  '[]' Stroller Base '[]' infant/child  '[]' unable to propel manual wheelchair '[]' allows for growth '[]' non-functional ambulator '[]' non-functional UE '[]' Indep mobility is not a goal at this time  '[x]' Tilt  '[]' Forward '[x]' Backward '[x]' Powered tilt  '[]' Manual tilt  '[x]' change position against gravitational force on head and shoulders  '[x]' change position for pressure relief/cannot weight shift '[x]' transfers  '[]' management of tone '[x]' rest periods '[x]' control edema '[x]' facilitate postural control  '[]'  ?????   '[]' Recline  '[]' Power recline on power base '[]' Manual recline on manual base  '[]' accommodate femur to back angle  '[]' bring to full recline for ADL care  '[]' change position for pressure relief/cannot weight shift '[]' rest periods '[]' repositioning for transfers or clothing/diaper /catheter changes '[]' head positioning  '[]' Lighter weight required '[]' self- propulsion  '[]' lifting '[]'  ?????  '[]' Heavy Duty required '[]' user weight greater than 250# '[]' extreme tone/ over active movement '[]' broken frame on previous chair '[]'  ?????  '[x]'  Back  '[x]'  Angle Adjustable '[]'  Custom molded ????? '[x]' postural control '[]' control of tone/spasticity '[]' accommodation of range of motion '[x]' UE functional control '[]' accommodation for seating system '[]'  ????? '[x]' provide lateral trunk support '[]' accommodate deformity '[x]' provide posterior trunk support '[x]' provide lumbar/sacral support '[x]' support trunk in midline '[x]' Pressure relief over spinal processes  '[x]'  Seat Cushion skin protection  / positioning comfort company M2 '[]' impaired sensation  '[]' decubitus ulcers present '[]' history of pressure ulceration '[x]' prevent pelvic extension '[x]' low maintenance  '[x]' stabilize pelvis  '[]' accommodate obliquity '[]' accommodate multiple deformity '[x]' neutralize lower extremity position '[x]' increase pressure distribution '[]'  ?????  '[]'  Pelvic/thigh support  '[]'  Lateral thigh guide '[]'  Distal medial pad  '[]'  Distal lateral pad '[]'  pelvis in neutral '[]' accommodate pelvis '[]'  position upper legs '[]'  alignment '[]'  accommodate ROM '[]'  decr adduction '[]' accommodate tone '[]' removable for transfers '[]' decr abduction  '[]'  Lateral trunk Supports '[]'  Lt     '[]'  Rt '[]' decrease lateral trunk leaning '[]' control tone '[]' contour for increased contact '[]' safety  '[]' accommodate asymmetry '[]'  ?????  '[x]'  Mounting hardware  '[]' lateral trunk supports  '[]' back   '[]' seat '[x]' headrest      '[]'  thigh support '[]' fixed   '[]' swing away '[]' attach seat platform/cushion to w/c frame '[]' attach back cushion  to w/c frame '[x]' mount postural supports '[x]' mount headrest  '[]' swing medial thigh support away '[]' swing lateral supports away for transfers  '[]'  ?????    Armrests  '[]' fixed '[x]' adjustable height '[]' removable   '[]' swing away  '[x]' flip back   '[]' reclining '[x]' full length pads '[]' desk    '[]' pads tubular  '[x]' provide support with elbow at 90   '[]' provide support for w/c tray '[x]' change of height/angles for variable activities '[x]' remove for transfers '[x]' allow to come closer to table top '[x]' remove for access to tables '[]'  ?????  Hangers/ Leg rests  '[]' 60 '[]' 70 '[]' 90 '[]' elevating '[]' heavy duty  '[]' articulating '[]' fixed '[]' lift off '[]' swing away     '[]' power '[]' provide LE support  '[]' accommodate to hamstring tightness '[]' elevate legs during recline   '[]' provide change in position for Legs '[]' Maintain placement of feet on footplate '[]' durability '[]' enable transfers '[]' decrease edema '[]' Accommodate lower leg length '[]'  ?????  Foot support Footplate    '[]' Lt  '[]'  Rt  '[x]'  Center mount '[x]' flip up     '[]' depth/angle adjustable '[]' Amputee  adapter    '[]'  Lt     '[]'  Rt '[x]' provide foot support '[]' accommodate to ankle ROM '[x]' transfers '[]' Provide support for residual extremity '[x]'  allow foot to go under wheelchair base '[]'  decrease tone  '[]'  ?????  '[]'  Ankle strap/heel loops '[]' support foot on foot support '[]' decrease extraneous movement '[]' provide input to heel  '[]' protect foot  Tires: '[]' pneumatic  '[x]' flat free inserts  '[]' solid  '[x]' decrease maintenance  '[x]' prevent frequent flats '[]' increase shock absorbency '[]' decrease pain from road shock '[]' decrease spasms from road shock '[]'  ?????  '[x]'  Headrest  '[x]' provide posterior head support '[]' provide posterior neck support '[]' provide lateral head support '[]' provide anterior head support '[x]' support during tilt and recline '[]' improve feeding   '[]' improve respiration '[]' placement of switches '[x]' safety  '[]' accommodate ROM  '[]' accommodate tone '[x]' improve visual orientation  '[]'  Anterior chest strap '[]'   Vest '[]'  Shoulder retractors  '[]' decrease forward movement of shoulder '[]' accommodation of TLSO '[]' decrease forward movement of trunk '[]' decrease shoulder elevation '[]' added abdominal support '[]' alignment '[]' assistance with shoulder control  '[]'  ?????  Pelvic Positioner '[x]' Belt '[]' SubASIS bar '[]' Dual Pull '[]' stabilize tone '[x]' decrease falling out of chair/ **will not Decr potential for sliding due to pelvic tilting '[]' prevent excessive rotation '[]' pad for protection over boney prominence '[]' prominence comfort '[]' special pull angle to control rotation '[]'  ?????  Upper Extremity Support '[]' L   '[]'  R '[]' Arm trough    '[]' hand support '[]'  tray       '[]' full tray '[]' swivel mount '[]' decrease edema      '[]' decrease subluxation   '[]' control tone   '[]' placement for AAC/Computer/EADL '[]' decrease gravitational pull on shoulders '[]' provide midline positioning '[]' provide support to increase UE function '[]' provide hand support in natural position '[]' provide work surface   POWER WHEELCHAIR CONTROLS  '[x]' Proportional  '[]' Non-Proportional Type Joystick '[]' Left  '[x]' Right '[x]' provides access for controlling wheelchair   '[]' lacks motor control to operate proportional drive control '[]' unable to understand proportional controls  Actuator Control Module  '[x]' Single  '[]' Multiple   '[x]' Allow the client to operate the power seat function(s) through the joystick control   '[]' Safety Reset Switches '[]' Used to change modes and stop the wheelchair when driving in latch mode    '[x]' Upgraded Electronics   '[x]' programming for accurate control '[x]' progressive Disease/changing condition '[]' non-proportional drive control needed '[x]' Needed in order to operate power seat functions through joystick control   '[]' Display box '[]' Allows user to see in which mode and drive the wheelchair is set  '[]' necessary for alternate controls    '[]' Digital interface electronics '[]' Allows w/c to operate when using alternative drive controls  '[]' ASL Head Array '[]' Allows client to  operate wheelchair  through switches placed in tri-panel headrest  '[]' Sip and puff with tubing kit '[]' needed to operate sip and puff drive controls  '[]' Upgraded tracking electronics '[]' increase safety when driving '[]' correct tracking when on uneven surfaces  '[x]' Mount for switches or joystick '[]' Attaches switches to w/c  '[x]' Swing away for access or transfers '[]' midline for optimal placement '[]' provides for consistent access  '[]' Attendant controlled joystick plus mount '[]' safety '[]' long distance driving '[]' operation of seat functions '[]' compliance with transportation regulations '[]'  ?????    Rear wheel placement/Axle adjustability '[]' None '[]' semi adjustable '[]' fully adjustable  '[]' improved UE access to wheels '[]' improved stability '[]' changing angle in space for improvement of postural stability '[]' 1-arm drive access '[]' amputee pad placement '[]'  ?????  Wheel rims/ hand rims  '[]' metal  '[]' plastic coated '[]' oblique projections '[]' vertical projections '[]' Provide ability to propel manual wheelchair  '[]'  Increase self-propulsion with hand weakness/decreased grasp  Push handles '[]' extended  '[]' angle adjustable  '[]' standard '[]' caregiver access '[]' caregiver assist '[]' allows "hooking" to enable  increased ability to perform ADLs or maintain balance  One armed device  '[]' Lt   '[]' Rt '[]' enable propulsion of manual wheelchair with one arm   '[]'  ?????   Brake/wheel lock extension '[]'  Lt   '[]'  Rt '[]' increase indep in applying wheel locks   '[]' Side guards '[]' prevent clothing getting caught in wheel or becoming soiled '[]'  prevent skin tears/abrasions  Battery: NF22 X 2 '[x]' to power wheelchair ?????  Other: ????? ????? ?????  The above equipment has a life- long use expectancy. Growth and changes in medical and/or functional conditions would be the exceptions. This is to certify that the therapist has no financial relationship with durable medical provider or manufacturer. The therapist will not receive remuneration of any kind for the equipment  recommended in this evaluation.   Patient has mobility limitation that significantly impairs safe, timely participation in one or more mobility related ADL's.  (bathing, toileting, feeding, dressing, grooming, moving from room to room)                                                             '[x]'  Yes '[]'  No Will mobility device sufficiently improve ability to participate and/or be aided in participation of MRADL's?         '[x]'  Yes '[]'  No Can limitation be compensated for with use of a cane or walker?                                                                                '[]'  Yes '[x]'  No Does patient or caregiver demonstrate ability/potential ability & willingness to safely use the mobility device?   '[x]'  Yes '[]'  No Does patient's home environment support use of recommended mobility device?                                                    '[x]'  Yes '[]'  No Does patient have sufficient upper extremity function necessary to functionally propel a manual wheelchair?    '[]'  Yes '[x]'  No Does patient have sufficient strength and trunk stability to safely operate a POV (scooter)?                                  '[]'  Yes '[x]'  No Does patient need additional features/benefits provided by a power wheelchair for MRADL's in the home?       '[x]'  Yes '[]'  No Does the patient demonstrate the ability to safely use a power wheelchair?                                                              [  x] Yes '[]'  No  Therapist Name Printed: Jamey Reas, PT, DPT Date: 06-Oct-2015  Therapist's Signature:   Date:   Supplier's Name Printed: [employer] Date: 10-06-15  Supplier's Signature:   Date:  Patient/Caregiver Signature:   Date:     This is to certify that I have read this evaluation and do agree with the content within:    Physician's Name Printed: Bluford Kaufmann, MD  Physician's Signature:  Date:     This is to certify that I, the above signed therapist have the following affiliations: '[]'  This DME  provider '[]'  Manufacturer of recommended equipment '[]'  Patient's lo te     [facility] PT Assessment - 10-06-15 0930    Assessment   Referring Provider Bluford Kaufmann, Ute - 2015-10-06 1201    Clinical Impression Statement This 81yo female is unsafe with gait even with a rolling walker. Her arthritis in shoulders and cervical region limits ability to propel a manual w/c. Patient needs a power w/c to enable safe mobility in home environment. Due to progressive nature of her disease, she needs a Group 3 power w/c. She needs power tilt option also.    Pt will benefit from skilled therapeutic intervention in order to improve on the following deficits Abnormal gait;Pain;Decreased balance   Rehab Potential Good   PT Frequency One time visit   PT Next Visit Plan w/c evaluation only   Recommended Other Services Group 3 power w/c   Consulted and Agree with Plan of Care Patient;Family member/caregiver   Family Member Consulted Davion Meara, son    f Ca     G-Codes - Oct 06, 2015 02/10/1205    Functional Assessment Tool Used Timed Up & Go 40.35sec with rolling walker and moderate assist   Functional Limitation Mobility: Walking and moving around   Mobility: Walking and Moving Around Current Status [phone]) At least 80 percent but less than 100 percent impaired, limited or restricted   Mobility: Walking and Moving Around Goal Status [id number]) At least 80 percent but less than 100 percent impaired, limited or restricted   Mobility: Walking and Moving Around Discharge Status [id number]) At least 80 percent but less than 100 percent impaired, limited or restricted    estricted       P Patient Active Problem List   Diagnosis Date Noted  . Hereditary and idiopathic peripheral neuropathy 05/17/2015  . Gait instability 05/09/2011  . History of aortic aneurysm   . CAD (coronary artery disease)   . LEG EDEMA, BILATERAL 05/21/2009  . Dyslipidemia  11/26/2008  . CORONARY ATHEROSCLEROSIS, ARTERY BYPASS GRAFT 08/23/2008  . Essential hypertension 05/11/2008  . Osteoarthritis 01/20/2008  . KNEE PAIN 09/16/2007  . DEPRESSION 05/21/2007  . IRRITABLE BOWEL SYNDROME 05/21/2007  . URTICARIA, CHRONIC 05/21/2007  . OSTEOPENIA 05/21/2007  . FIBROCYSTIC BREAST DISEASE, HX OF 05/21/2007   06/2WALDRON,ROBINDRON,ROBINNovember 12, 20160/212:25 PM12:24 PM  [address],[id number] [phone]   [place] [id number] Date of Birth: 07/12/1932

## 2015-09-28 ENCOUNTER — Telehealth: Payer: Self-pay | Admitting: Internal Medicine

## 2015-09-28 MED ORDER — HYDROCODONE-ACETAMINOPHEN 5-325 MG PO TABS: 1.0000 | ORAL_TABLET | Freq: Four times a day (QID) | ORAL | Status: DC | PRN

## 2015-09-28 NOTE — Telephone Encounter (Signed)
Patient would like a refill on her Hydrocodone meds.

## 2015-09-28 NOTE — Telephone Encounter (Signed)
Pt notified Rx ready for pickup. Rx printed and signed.  

## 2015-10-11 ENCOUNTER — Encounter: Payer: Self-pay | Admitting: Family Medicine

## 2015-10-11 ENCOUNTER — Other Ambulatory Visit: Payer: Self-pay | Admitting: Internal Medicine

## 2015-10-11 ENCOUNTER — Ambulatory Visit (INDEPENDENT_AMBULATORY_CARE_PROVIDER_SITE_OTHER): Payer: Medicare Other | Admitting: Family Medicine

## 2015-10-11 VITALS — BP 134/74 | HR 82 | Temp 97.6°F

## 2015-10-11 DIAGNOSIS — L03115 Cellulitis of right lower limb: Secondary | ICD-10-CM

## 2015-10-11 DIAGNOSIS — R600 Localized edema: Secondary | ICD-10-CM

## 2015-10-11 MED ORDER — DOXYCYCLINE HYCLATE 100 MG PO TABS: 100.0000 mg | ORAL_TABLET | Freq: Two times a day (BID) | ORAL | Status: DC

## 2015-10-11 NOTE — Progress Notes (Signed)
   Subjective:    Patient ID: AYANA IMHOF, female    DOB: Mar 16, 1932, 81 y.o.   MRN: [id number]  HPI Here with her son for one week of redness, swelling, and pain in the right leg. No fever. She has chronic LE edema and gets cellulitis at times. She asks to be referred back to the Wound Clinic (she saw them a year ago).    Review of Systems  Constitutional: Negative.   Respiratory: Negative.   Cardiovascular: Positive for leg swelling. Negative for chest pain and palpitations.  Skin: Positive for color change.       Objective:   Physical Exam  Constitutional: She appears well-developed and well-nourished.  In a wheelchair  Cardiovascular: Normal rate, regular rhythm, normal heart sounds and intact distal pulses.   Pulmonary/Chest: Effort normal and breath sounds normal.  Musculoskeletal:  Left lower leg has 2+ edema. The right lower leg has 4+ edema , it is red, warm, and tender. There as several deep venous stasis ulcers on this leg          Assessment & Plan:  Cellulitis, treat with Doxycycline. Refer to the Wound Clinic. Keep the leg elevated.

## 2015-10-11 NOTE — Progress Notes (Signed)
Pre visit review using our clinic review tool, if applicable. No additional management support is needed unless otherwise documented below in the visit note. Pt unable to stand and weigh.   

## 2015-10-16 ENCOUNTER — Other Ambulatory Visit: Payer: Self-pay | Admitting: Internal Medicine

## 2015-10-25 ENCOUNTER — Telehealth: Payer: Self-pay | Admitting: Internal Medicine

## 2015-10-25 NOTE — Telephone Encounter (Signed)
Ms. Keilman called saying she needs a Rx for Hydrocodone. Due to her no longer driving either her daughter Judeth Cornfield or her son Homero Fellers will pick it up. Please give her a call if you have questions or concerns.   Pt ph# [phone] Thank you.

## 2015-10-26 MED ORDER — HYDROCODONE-ACETAMINOPHEN 5-325 MG PO TABS: 1.0000 | ORAL_TABLET | Freq: Four times a day (QID) | ORAL | Status: DC | PRN

## 2015-10-26 NOTE — Telephone Encounter (Signed)
Pt notified Rx ready for pickup. Rx printed and signed.  

## 2015-10-29 ENCOUNTER — Ambulatory Visit (INDEPENDENT_AMBULATORY_CARE_PROVIDER_SITE_OTHER): Payer: Medicare Other | Admitting: Internal Medicine

## 2015-10-29 ENCOUNTER — Other Ambulatory Visit: Payer: Self-pay | Admitting: Internal Medicine

## 2015-10-29 ENCOUNTER — Ambulatory Visit (HOSPITAL_COMMUNITY)
Admission: RE | Admit: 2015-10-29 | Discharge: 2015-10-29 | Disposition: A | Discharge status: Home / Self Care | Payer: Medicare Other | Source: Ambulatory Visit | Attending: Internal Medicine | Admitting: Internal Medicine

## 2015-10-29 ENCOUNTER — Encounter: Payer: Self-pay | Admitting: Internal Medicine

## 2015-10-29 VITALS — BP 120/60 | HR 61 | Resp 20 | Ht 62.0 in | Wt 134.0 lb

## 2015-10-29 DIAGNOSIS — E785 Hyperlipidemia, unspecified: Secondary | ICD-10-CM | POA: Diagnosis not present

## 2015-10-29 DIAGNOSIS — M150 Primary generalized (osteo)arthritis: Secondary | ICD-10-CM | POA: Diagnosis not present

## 2015-10-29 DIAGNOSIS — I10 Essential (primary) hypertension: Secondary | ICD-10-CM | POA: Diagnosis not present

## 2015-10-29 DIAGNOSIS — I83019 Varicose veins of right lower extremity with ulcer of unspecified site: Secondary | ICD-10-CM | POA: Diagnosis not present

## 2015-10-29 DIAGNOSIS — IMO0001 Reserved for inherently not codable concepts without codable children: Secondary | ICD-10-CM

## 2015-10-29 DIAGNOSIS — R2681 Unsteadiness on feet: Secondary | ICD-10-CM | POA: Diagnosis not present

## 2015-10-29 DIAGNOSIS — M159 Polyosteoarthritis, unspecified: Secondary | ICD-10-CM

## 2015-10-29 MED ORDER — SILVER SULFADIAZINE 1 % EX CREA: 1.0000 "application " | TOPICAL_CREAM | Freq: Every day | CUTANEOUS | Status: DC

## 2015-10-29 NOTE — Progress Notes (Signed)
Pre visit review using our clinic review tool, if applicable. No additional management support is needed unless otherwise documented below in the visit note. 

## 2015-10-29 NOTE — Progress Notes (Signed)
Subjective:    Patient ID: [name], female    DOB: [birth year], 81 y.o.   MRN: [id number]  HPI  81 year old patient who has a history chronic venous insufficiency with the ulceration.  She was seen approximately 3 weeks ago and was treated with doxycycline.  She continues to have open ulceration involving her right lower leg.  She has had the increasing right lower leg edema for the past month.  Remains on methotrexate  Treatment. She has been followed at the wound center in the past and does have a follow-up scheduled on December 27  Past Medical History  Diagnosis Date  . DEPRESSION 05/21/2007  . FIBROCYSTIC BREAST DISEASE, HX OF 05/21/2007  . HYPERLIPIDEMIA-MIXED 11/26/2008    takes Simvastatin daily  . Irritable bowel syndrome 05/21/2007  . KNEE PAIN 09/16/2007  . LEG EDEMA, BILATERAL 05/21/2009  . OSTEOPENIA 05/21/2007  . URTICARIA, CHRONIC 05/21/2007  . History of aortic aneurysm     resection and grafting of a 5.2-cm ascending aortic arch aneurysm August 2009  . Dyslipidemia   . Vitamin D deficiency   . Urticaria   . Neuropathy (HCC)     left radial  neuropathy with wrist drop  . Diverticulosis   . Irritable bowel syndrome   . GERD (gastroesophageal reflux disease)   . 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 Vaginal yeast infection     recently told to use monistat, hasn't started as of 01/26/2012  . HYPERTENSION 05/11/2008    takes Metoprolol and HCTZ daily  . Asthma     hx of;as a child  . Seasonal allergies     takes Hydroxyzine prn and Zyrtec 2 times a week  . Peripheral edema     takes Furosemide prn  . Pneumonia     history of;in the 90's  . Weakness     hands and feet  . Sciatica   . OSTEOARTHRITIS 01/20/2008    takes Methotrexate weekly  . DJD (degenerative joint disease)     history of   . Rheumatoid arthritis(714.0)   . Chronic back pain     takes Norco prn  . Bruises easily     takes Plaquenil daily  . CORONARY ATHEROSCLEROSIS, ARTERY  BYPASS GRAFT 08/23/2008  . CAD (coronary artery disease)     s/p CABG -  x 1 with LIMA to LAD August 2009 /   . Urinary incontinence   . Bowel incontinence   . History of colon polyps   . Urinary frequency   . Urinary urgency   . Nocturia   . Anemia     takes folic acid bid  . Urinary incontinence     wears depends  . Hereditary and idiopathic peripheral neuropathy 05/17/2015    Social History   Social History  . Marital Status: Widowed    Spouse Name: N/A  . Number of Children: 2  . Years of Education: N/A   Occupational History  . retired    Social History Main Topics  . Smoking status: Never Smoker   . Smokeless tobacco: Never Used  . Alcohol Use: No  . Drug Use: No  . Sexual Activity: No   Other Topics Concern  . Not on file   Social History Narrative   Patient is right handed.   Patient drinks a little caffeine daily.    Past Surgical History  Procedure Laterality Date  . Appendectomy    . Cesarean section  1968    x 1  . Hernia repair    . Coronary artery bypass graft  August 2009    CABG x 1 with LIMA to LAD /  . Ascending aortic aneurysm repair  August 2009     resection and grafting of a 5.2-cm ascending aortic arch aneurysm  . Joint replacement      2009 & 2010- both knees  . Back surgery      2011- cerv. fusion   . Breast surgery      biopsy x2- R breast  . Tonsillectomy      as a child  . Cardiac catheterization  2009  . Colonoscopy    . Lumbar laminectomy/decompression microdiscectomy Bilateral 07/11/2013    Procedure: LUMBAR LAMINECTOMY/DECOMPRESSION MICRODISCECTOMY LUMBAR FIVE SACRAL ONE;  Surgeon: Mariam Dollar, MD;  Location: [facility];  Service: Neurosurgery;  Laterality: Bilateral;    Family History  Problem Relation Age of Onset  . Heart disease    . Coronary artery disease    . Anesthesia problems Neg Hx   . Hypotension Neg Hx   . Malignant hyperthermia Neg Hx   . Pseudochol deficiency Neg Hx   . Heart disease Mother   .  Stroke Father   . Cancer Maternal Grandmother   . Stroke Paternal Grandmother     Allergies  Allergen Reactions  . Amoxicillin     REACTION: unspecified  . Cyclobenzaprine Hcl     REACTION: rash  . Mupirocin     Unknown reaction  . Penicillins Hives  . Tetanus Toxoid Hives    Current Outpatient Prescriptions on File Prior to Visit  Medication Sig Dispense Refill  . acetaminophen (TYLENOL) 500 MG tablet Take 500 mg by mouth every 6 (six) hours as needed (for pain.).    Marland Kitchen aspirin EC 81 MG tablet Take 81 mg by mouth at bedtime.     . calcium carbonate (TUMS - DOSED IN MG ELEMENTAL CALCIUM) 500 MG chewable tablet Chew 1 tablet by mouth daily as needed for indigestion or heartburn.    . cetirizine (ZYRTEC) 10 MG tablet Take 10 mg by mouth daily as needed. Alternating with Hydroxyzine  For allergies    . cholecalciferol (VITAMIN D) 1000 UNITS tablet Take 1,000 Units by mouth daily.    . folic acid (FOLVITE) 1 MG tablet Take 1 mg by mouth 2 (two) times daily.    . furosemide (LASIX) 40 MG tablet TAKE 1/2 TO 1 TABLET BY MOUTH AS NEEDED FOR SWELLING 30 tablet 2  . hydrochlorothiazide (HYDRODIURIL) 25 MG tablet TAKE 1 TABLET BY MOUTH EVERY MORNING 90 tablet 1  . HYDROcodone-acetaminophen (NORCO/VICODIN) 5-325 MG tablet Take 1 tablet by mouth every 6 (six) hours as needed for moderate pain. 60 tablet 0  . hydroxychloroquine (PLAQUENIL) 200 MG tablet Take 200 mg by mouth 2 (two) times daily.     . hydrOXYzine (ATARAX/VISTARIL) 25 MG tablet Take 25 mg by mouth as needed. For allergies alternate with Zyrtec    . methotrexate (RHEUMATREX) 2.5 MG tablet Take 15 mg by mouth once a week. Caution:Chemotherapy. Protect from light. Take on Wednesday    . metoprolol succinate (TOPROL-XL) 50 MG 24 hr tablet TAKE 1 TABLET BY MOUTH EVERY DAY 30 tablet 5  . NASONEX 50 MCG/ACT nasal spray Place 2 sprays into the nose as needed.   5  . Pediatric Multivit-Minerals-C (COMPLETE MULTI-VITAMIN PO) Take 1 tablet by  mouth daily.    . potassium chloride SA (K-DUR,KLOR-CON) 20  MEQ tablet Take 20 mEq by mouth See admin instructions. Take 1 tablet daily as needed for low potassium. (Only take when needed with the lasix)    . simvastatin (ZOCOR) 40 MG tablet TAKE 1 TABLET BY MOUTH AT BEDTIME 30 tablet 5   No current facility-administered medications on file prior to visit.    BP 120/60 mmHg  Pulse 61  Resp 20  Ht 5\' 2"  (1.575 m)  Wt 134 lb (60.782 kg)  BMI 24.50 kg/m2  SpO2 97%     Review of Systems  HENT: Negative for congestion, dental problem, hearing loss, rhinorrhea, sinus pressure, sore throat and tinnitus.   Eyes: Negative for pain, discharge and visual disturbance.  Respiratory: Negative for cough and shortness of breath.   Cardiovascular: Positive for leg swelling. Negative for chest pain and palpitations.  Gastrointestinal: Negative for nausea, vomiting, abdominal pain, diarrhea, constipation, blood in stool and abdominal distention.  Genitourinary: Negative for dysuria, urgency, frequency, hematuria, flank pain, vaginal bleeding, vaginal discharge, difficulty urinating, vaginal pain and pelvic pain.  Musculoskeletal: Positive for back pain, arthralgias and gait problem. Negative for joint swelling.  Skin: Positive for rash and wound.  Neurological: Positive for weakness. Negative for dizziness, syncope, speech difficulty, numbness and headaches.  Hematological: Negative for adenopathy.  Psychiatric/Behavioral: Negative for behavioral problems, dysphoric mood and agitation. The patient is not nervous/anxious.        Objective:   Physical Exam  Constitutional: She appears well-developed and well-nourished. No distress.  Musculoskeletal:  Prominent edema of the distal right leg 2.  Prominent areas of ulceration, one involving the right lateral lower leg, 2.5 x 3.5 cm.  Another ulcer involving the medial calf deeper but slightly smaller at 2 x 3 cm          Assessment & Plan:    Chronic lower extremity edema/stasis dermatitis with ulceration.  Local wound care discussed.  She will clean with soap and water daily and apply Silvadene and dress. We'll check a venous Doppler to rule out DVT Wound Center consultation as scheduled   Gait abnormality.  Hopeful to obtain a powered wheelchair soon Peripheral neuropathy Essential hypertension

## 2015-10-29 NOTE — Patient Instructions (Signed)
Limit your sodium (Salt) intake   keep legs elevated as much as possible  Venous Ulcer A venous ulcer is a shallow sore on your lower leg that is caused by poor circulation in your veins. This condition used to be called stasis ulcer.  Veins have valves that help return blood to the heart. If these valves do not work properly, it can cause blood to flow backward and to back up into the veins near the skin. When that happens, blood can pool in your lower legs. The blood can then leak out of your veins, which can irritate your skin. This may cause a break in your skin that becomes a venous ulcer. Venous ulcer is the most common type of lower leg ulcer. You may have venous ulcers on one leg or on both legs. The area where this condition most commonly develops is around the ankles. A venous ulcer may last for a long time (chronic ulcer) or it may return repeatedly (recurrent ulcer). CAUSES Any condition that causes poor circulation to your legs can lead to a venous ulcer.  RISK FACTORS This condition is more likely to develop in:  People who are 79 years of age or older.  People who are overweight.  People who are not active.  People who have had a leg ulcer in the past.  People who have clots in their lower leg veins (deep vein thrombosis).  People who have inflammation of their leg veins (phlebitis).  Women who have given birth.  People who smoke. SYMPTOMS  The most common symptom of this condition is an open sore near your ankle. Other symptoms may include:  Swelling.  Thickening of the skin.  Fluid leaking from the ulcer.  Bleeding.  Itching.  Pain and swelling that gets worse when you stand up and feels better when you raise your leg.  Blotchy skin.  Darkening of the skin. DIAGNOSIS  Your health care provider may suspect a venous ulcer based on your medical history and your risk factors. Your health care provider will check the skin on your legs. Other tests may be  done to learn more about the ulcer and to determine the best way to treat it. Tests that may be done include:  Measuring the blood pressure in your arms and legs.  Using sound waves (ultrasound) to measure the blood flow in your leg veins. TREATMENT You may need to try several different types of treatment to get your venous ulcer to heal. Healing may take a long time. Treatment may include:  Keeping your leg raised (elevated).  Wearing a type of bandage or stocking to compress the veins of your leg (compression therapy). Venous wounds are not likely to heal or to stay healed without compression.  Taking medicines to improve blood flow.  Taking antibiotic medicines to treat infection.  Cleaning your ulcer and removing any dead tissue from the wound (debridement).  Placing various types of medicated bandage (dressings) or medicated wraps on your ulcer. This helps the ulcer to heal and helps to prevent infection. Surgery is sometimes needed to close the wound using a piece of skin taken from another area of your body (graft). You may need surgery if other treatments are not working or if your ulcer is very deep. HOME CARE INSTRUCTIONS Wound Care  Follow instructions from your health care provider about:  How to take care of your wound.  When and how you should change your bandage (dressing).  When you should remove your dressing.  If your dressing is dry and sticks to your leg when you try to remove it, moisten or wet the dressing with saline solution or water so that the dressing can be removed without harming your skin or wound tissue.  Check your wound every day for signs of infection. Have a caregiver do this for you if you are not able to do it yourself. Check for:  More redness, swelling, or pain. More fluid or blood.  Pus, warmth, or a bad smell. Medicines   Take over-the-counter and prescription medicines only as told by your health care provider.  If you were prescribed  an antibiotic medicine, take it or apply it as told by your health care provider. Do not stop taking or using the antibiotic even if your condition improves. Activity  Do not stand or sit in one position for a long period of time. Rest with your legs raised during the day. If possible, keep your legs above your heart for 30 minutes, 3-4 times a day, or as told by your health care provider.  Do not sit with your legs crossed.   Walk often to increase the blood flow in your legs.Ask your health care provider what level of activity is safe for you.  If you are taking a long ride in a car or plane, take a break to walk around at least once every two hours, or as often as your health care provider recommends. Ask your health care provider if you should take aspirin before long trips.  General Instructions  Wear elastic stockings, compression stockings, or support hose as told by your health care provider. This is very important.  Raise the foot of your bed as told by your health care provider.  Do not smoke.  Keep all follow-up visits as told by your health care provider. This is important. SEEK MEDICAL CARE IF:   You have a fever.   Your ulcer is getting larger or is not healing.   Your pain gets worse.   You have more redness or swelling around your ulcer.  You have more fluid, blood, or pus coming from your ulcer after it has been cleaned by you or your health care provider.  You have warmth or a bad smell coming from your ulcer.   This information is not intended to replace advice given to you by your health care provider. Make sure you discuss any questions you have with your health care provider.   Document Released: 08/05/2001 Document Revised: 08/01/2015 Document Reviewed: 03/21/2015 Elsevier Interactive Patient Education [employer]

## 2015-11-13 ENCOUNTER — Emergency Department (HOSPITAL_COMMUNITY): Payer: Medicare Other

## 2015-11-13 ENCOUNTER — Telehealth: Payer: Self-pay | Admitting: Internal Medicine

## 2015-11-13 ENCOUNTER — Encounter (HOSPITAL_COMMUNITY): Payer: Self-pay | Admitting: *Deleted

## 2015-11-13 ENCOUNTER — Ambulatory Visit (INDEPENDENT_AMBULATORY_CARE_PROVIDER_SITE_OTHER): Payer: Medicare Other | Admitting: Internal Medicine

## 2015-11-13 ENCOUNTER — Inpatient Hospital Stay (HOSPITAL_COMMUNITY)
Admission: EM | Admit: 2015-11-13 | Discharge: 2015-11-19 | DRG: 871 | Disposition: A | Discharge status: Skilled Nursing Facility | Payer: Medicare Other | Attending: Internal Medicine | Admitting: Internal Medicine

## 2015-11-13 ENCOUNTER — Encounter: Payer: Self-pay | Admitting: Internal Medicine

## 2015-11-13 VITALS — BP 140/70 | HR 60 | Resp 18

## 2015-11-13 DIAGNOSIS — Z66 Do not resuscitate: Secondary | ICD-10-CM | POA: Diagnosis present

## 2015-11-13 DIAGNOSIS — R2681 Unsteadiness on feet: Secondary | ICD-10-CM

## 2015-11-13 DIAGNOSIS — R410 Disorientation, unspecified: Secondary | ICD-10-CM | POA: Diagnosis not present

## 2015-11-13 DIAGNOSIS — E860 Dehydration: Secondary | ICD-10-CM | POA: Diagnosis present

## 2015-11-13 DIAGNOSIS — I868 Varicose veins of other specified sites: Secondary | ICD-10-CM | POA: Diagnosis present

## 2015-11-13 DIAGNOSIS — Z8679 Personal history of other diseases of the circulatory system: Secondary | ICD-10-CM

## 2015-11-13 DIAGNOSIS — F05 Delirium due to known physiological condition: Secondary | ICD-10-CM | POA: Diagnosis not present

## 2015-11-13 DIAGNOSIS — E440 Moderate protein-calorie malnutrition: Secondary | ICD-10-CM | POA: Diagnosis present

## 2015-11-13 DIAGNOSIS — I25810 Atherosclerosis of coronary artery bypass graft(s) without angina pectoris: Secondary | ICD-10-CM | POA: Diagnosis present

## 2015-11-13 DIAGNOSIS — Z9181 History of falling: Secondary | ICD-10-CM

## 2015-11-13 DIAGNOSIS — A419 Sepsis, unspecified organism: Principal | ICD-10-CM | POA: Diagnosis present

## 2015-11-13 DIAGNOSIS — M05731 Rheumatoid arthritis with rheumatoid factor of right wrist without organ or systems involvement: Secondary | ICD-10-CM | POA: Diagnosis not present

## 2015-11-13 DIAGNOSIS — I5032 Chronic diastolic (congestive) heart failure: Secondary | ICD-10-CM | POA: Diagnosis present

## 2015-11-13 DIAGNOSIS — I2510 Atherosclerotic heart disease of native coronary artery without angina pectoris: Secondary | ICD-10-CM | POA: Diagnosis present

## 2015-11-13 DIAGNOSIS — M05732 Rheumatoid arthritis with rheumatoid factor of left wrist without organ or systems involvement: Secondary | ICD-10-CM

## 2015-11-13 DIAGNOSIS — R1110 Vomiting, unspecified: Secondary | ICD-10-CM

## 2015-11-13 DIAGNOSIS — Z823 Family history of stroke: Secondary | ICD-10-CM

## 2015-11-13 DIAGNOSIS — I10 Essential (primary) hypertension: Secondary | ICD-10-CM | POA: Diagnosis not present

## 2015-11-13 DIAGNOSIS — I878 Other specified disorders of veins: Secondary | ICD-10-CM | POA: Diagnosis present

## 2015-11-13 DIAGNOSIS — G609 Hereditary and idiopathic neuropathy, unspecified: Secondary | ICD-10-CM | POA: Diagnosis not present

## 2015-11-13 DIAGNOSIS — M069 Rheumatoid arthritis, unspecified: Secondary | ICD-10-CM | POA: Diagnosis present

## 2015-11-13 DIAGNOSIS — E876 Hypokalemia: Secondary | ICD-10-CM | POA: Diagnosis present

## 2015-11-13 DIAGNOSIS — G8929 Other chronic pain: Secondary | ICD-10-CM | POA: Diagnosis present

## 2015-11-13 DIAGNOSIS — N390 Urinary tract infection, site not specified: Secondary | ICD-10-CM | POA: Diagnosis not present

## 2015-11-13 DIAGNOSIS — Z6821 Body mass index (BMI) 21.0-21.9, adult: Secondary | ICD-10-CM

## 2015-11-13 DIAGNOSIS — B964 Proteus (mirabilis) (morganii) as the cause of diseases classified elsewhere: Secondary | ICD-10-CM | POA: Diagnosis present

## 2015-11-13 DIAGNOSIS — R0600 Dyspnea, unspecified: Secondary | ICD-10-CM

## 2015-11-13 DIAGNOSIS — I712 Thoracic aortic aneurysm, without rupture: Secondary | ICD-10-CM | POA: Diagnosis present

## 2015-11-13 DIAGNOSIS — D61818 Other pancytopenia: Secondary | ICD-10-CM | POA: Diagnosis present

## 2015-11-13 DIAGNOSIS — Z8249 Family history of ischemic heart disease and other diseases of the circulatory system: Secondary | ICD-10-CM

## 2015-11-13 DIAGNOSIS — E785 Hyperlipidemia, unspecified: Secondary | ICD-10-CM | POA: Diagnosis present

## 2015-11-13 DIAGNOSIS — I872 Venous insufficiency (chronic) (peripheral): Secondary | ICD-10-CM | POA: Diagnosis present

## 2015-11-13 DIAGNOSIS — G9340 Encephalopathy, unspecified: Secondary | ICD-10-CM | POA: Diagnosis present

## 2015-11-13 DIAGNOSIS — G9341 Metabolic encephalopathy: Secondary | ICD-10-CM | POA: Diagnosis present

## 2015-11-13 DIAGNOSIS — Z951 Presence of aortocoronary bypass graft: Secondary | ICD-10-CM

## 2015-11-13 LAB — BLOOD GAS, ARTERIAL
ACID-BASE EXCESS: 2.5 mmol/L — AB (ref 0.0–2.0)
Bicarbonate: 28.1 mEq/L — ABNORMAL HIGH (ref 20.0–24.0)
DRAWN BY: 11249
FIO2: 0.21
O2 SAT: 95.3 %
PATIENT TEMPERATURE: 98.6
TCO2: 26.5 mmol/L (ref 0–100)
pCO2 arterial: 52 mmHg — ABNORMAL HIGH (ref 35.0–45.0)
pH, Arterial: 7.352 (ref 7.350–7.450)
pO2, Arterial: 81.4 mmHg (ref 80.0–100.0)

## 2015-11-13 LAB — COMPREHENSIVE METABOLIC PANEL
ALT: 55 U/L — ABNORMAL HIGH (ref 14–54)
AST: 68 U/L — ABNORMAL HIGH (ref 15–41)
Albumin: 3.2 g/dL — ABNORMAL LOW (ref 3.5–5.0)
Alkaline Phosphatase: 100 U/L (ref 38–126)
Anion gap: 12 (ref 5–15)
BUN: 19 mg/dL (ref 6–20)
CHLORIDE: 101 mmol/L (ref 101–111)
CO2: 31 mmol/L (ref 22–32)
CREATININE: 0.54 mg/dL (ref 0.44–1.00)
Calcium: 9.3 mg/dL (ref 8.9–10.3)
Glucose, Bld: 99 mg/dL (ref 65–99)
POTASSIUM: 3.8 mmol/L (ref 3.5–5.1)
Sodium: 144 mmol/L (ref 135–145)
TOTAL PROTEIN: 6.4 g/dL — AB (ref 6.5–8.1)
Total Bilirubin: 1.1 mg/dL (ref 0.3–1.2)

## 2015-11-13 LAB — URINALYSIS, ROUTINE W REFLEX MICROSCOPIC
Bilirubin Urine: NEGATIVE
Glucose, UA: NEGATIVE mg/dL
HGB URINE DIPSTICK: NEGATIVE
Ketones, ur: 40 mg/dL — AB
Nitrite: NEGATIVE
PROTEIN: NEGATIVE mg/dL
SPECIFIC GRAVITY, URINE: 1.018 (ref 1.005–1.030)
pH: 7 (ref 5.0–8.0)

## 2015-11-13 LAB — URINE MICROSCOPIC-ADD ON

## 2015-11-13 LAB — AMMONIA: AMMONIA: 17 umol/L (ref 9–35)

## 2015-11-13 LAB — CBC WITH DIFFERENTIAL/PLATELET
BASOS ABS: 0 10*3/uL (ref 0.0–0.1)
Basophils Relative: 0 %
EOS ABS: 0 10*3/uL (ref 0.0–0.7)
Eosinophils Relative: 1 %
HCT: 30.4 % — ABNORMAL LOW (ref 36.0–46.0)
HEMOGLOBIN: 9.8 g/dL — AB (ref 12.0–15.0)
LYMPHS ABS: 0.5 10*3/uL — AB (ref 0.7–4.0)
LYMPHS PCT: 13 %
MCH: 27.5 pg (ref 26.0–34.0)
MCHC: 32.2 g/dL (ref 30.0–36.0)
MCV: 85.2 fL (ref 78.0–100.0)
Monocytes Absolute: 0.3 10*3/uL (ref 0.1–1.0)
Monocytes Relative: 9 %
NEUTROS PCT: 77 %
Neutro Abs: 2.7 10*3/uL (ref 1.7–7.7)
Platelets: 111 10*3/uL — ABNORMAL LOW (ref 150–400)
RBC: 3.57 MIL/uL — AB (ref 3.87–5.11)
RDW: 17.1 % — ABNORMAL HIGH (ref 11.5–15.5)
WBC: 3.5 10*3/uL — AB (ref 4.0–10.5)

## 2015-11-13 LAB — I-STAT CG4 LACTIC ACID, ED
LACTIC ACID, VENOUS: 1.06 mmol/L (ref 0.5–2.0)
LACTIC ACID, VENOUS: 0.4 mmol/L — AB (ref 0.5–2.0)

## 2015-11-13 LAB — MAGNESIUM: MAGNESIUM: 1.7 mg/dL (ref 1.7–2.4)

## 2015-11-13 LAB — TROPONIN I

## 2015-11-13 LAB — BRAIN NATRIURETIC PEPTIDE: B NATRIURETIC PEPTIDE 5: 683.3 pg/mL — AB (ref 0.0–100.0)

## 2015-11-13 MED ORDER — ENOXAPARIN SODIUM 40 MG/0.4ML ~~LOC~~ SOLN
40.0000 mg | SUBCUTANEOUS | Status: DC
  Administered 2015-11-13 – 2015-11-18 (×5): 40 mg via SUBCUTANEOUS
  Filled 2015-11-13 (×6): qty 0.4

## 2015-11-13 MED ORDER — METOPROLOL TARTRATE 1 MG/ML IV SOLN
2.5000 mg | Freq: Four times a day (QID) | INTRAVENOUS | Status: DC
  Administered 2015-11-14 – 2015-11-18 (×16): 2.5 mg via INTRAVENOUS
  Filled 2015-11-13 (×18): qty 5

## 2015-11-13 MED ORDER — ADULT MULTIVITAMIN W/MINERALS CH
1.0000 | ORAL_TABLET | Freq: Every day | ORAL | Status: DC
  Administered 2015-11-14 – 2015-11-15 (×2): 1 via ORAL
  Filled 2015-11-13 (×3): qty 1

## 2015-11-13 MED ORDER — METOPROLOL TARTRATE 1 MG/ML IV SOLN: 2.5000 mg | Freq: Four times a day (QID) | INTRAVENOUS | Status: DC

## 2015-11-13 MED ORDER — COMPLETE MULTI-VITAMIN PO CHEW: CHEWABLE_TABLET | Freq: Every day | ORAL | Status: DC

## 2015-11-13 MED ORDER — HYDROCHLOROTHIAZIDE 25 MG PO TABS
25.0000 mg | ORAL_TABLET | Freq: Every morning | ORAL | Status: DC
  Administered 2015-11-14 – 2015-11-19 (×4): 25 mg via ORAL
  Filled 2015-11-13 (×6): qty 1

## 2015-11-13 MED ORDER — POTASSIUM CHLORIDE IN NACL 20-0.9 MEQ/L-% IV SOLN
INTRAVENOUS | Status: DC
  Administered 2015-11-13 – 2015-11-15 (×2): via INTRAVENOUS
  Filled 2015-11-13 (×5): qty 1000

## 2015-11-13 MED ORDER — ACETAMINOPHEN 500 MG PO TABS
500.0000 mg | ORAL_TABLET | Freq: Four times a day (QID) | ORAL | Status: DC | PRN
  Administered 2015-11-15 – 2015-11-16 (×2): 500 mg via ORAL
  Filled 2015-11-13 (×2): qty 1

## 2015-11-13 MED ORDER — LORATADINE 10 MG PO TABS
10.0000 mg | ORAL_TABLET | Freq: Every day | ORAL | Status: DC
  Administered 2015-11-13 – 2015-11-15 (×3): 10 mg via ORAL
  Filled 2015-11-13 (×4): qty 1

## 2015-11-13 MED ORDER — VITAMIN D3 25 MCG (1000 UNIT) PO TABS
1000.0000 [IU] | ORAL_TABLET | Freq: Every day | ORAL | Status: DC
  Administered 2015-11-14 – 2015-11-15 (×2): 1000 [IU] via ORAL
  Filled 2015-11-13 (×3): qty 1

## 2015-11-13 MED ORDER — CALCIUM CARBONATE ANTACID 500 MG PO CHEW
1.0000 | CHEWABLE_TABLET | Freq: Every day | ORAL | Status: DC | PRN
  Administered 2015-11-13: 200 mg via ORAL
  Filled 2015-11-13: qty 1

## 2015-11-13 MED ORDER — SODIUM CHLORIDE 0.9 % IV BOLUS (SEPSIS)
1000.0000 mL | Freq: Once | INTRAVENOUS | Status: AC
  Administered 2015-11-13: 1000 mL via INTRAVENOUS

## 2015-11-13 MED ORDER — SILVER SULFADIAZINE 1 % EX CREA
1.0000 "application " | TOPICAL_CREAM | Freq: Every day | CUTANEOUS | Status: DC
  Administered 2015-11-13 – 2015-11-18 (×6): 1 via TOPICAL
  Filled 2015-11-13: qty 85

## 2015-11-13 MED ORDER — DEXTROSE 5 % IV SOLN
1.0000 g | INTRAVENOUS | Status: DC
  Administered 2015-11-13 – 2015-11-18 (×6): 1 g via INTRAVENOUS
  Filled 2015-11-13 (×7): qty 10

## 2015-11-13 MED ORDER — SIMVASTATIN 40 MG PO TABS
40.0000 mg | ORAL_TABLET | Freq: Every day | ORAL | Status: DC
  Administered 2015-11-13 – 2015-11-18 (×6): 40 mg via ORAL
  Filled 2015-11-13 (×6): qty 1

## 2015-11-13 MED ORDER — ASPIRIN EC 81 MG PO TBEC
81.0000 mg | DELAYED_RELEASE_TABLET | Freq: Every day | ORAL | Status: DC
  Administered 2015-11-13 – 2015-11-18 (×6): 81 mg via ORAL
  Filled 2015-11-13 (×6): qty 1

## 2015-11-13 MED ORDER — FOLIC ACID 1 MG PO TABS
1.0000 mg | ORAL_TABLET | Freq: Two times a day (BID) | ORAL | Status: DC
  Administered 2015-11-13 – 2015-11-15 (×5): 1 mg via ORAL
  Filled 2015-11-13 (×6): qty 1

## 2015-11-13 MED ORDER — DEXTROSE 5 % IV SOLN
1.0000 g | Freq: Once | INTRAVENOUS | Status: AC
  Administered 2015-11-13: 1 g via INTRAVENOUS
  Filled 2015-11-13: qty 10

## 2015-11-13 MED ORDER — ONDANSETRON HCL 4 MG/2ML IJ SOLN
4.0000 mg | Freq: Four times a day (QID) | INTRAMUSCULAR | Status: DC | PRN
  Administered 2015-11-16: 4 mg via INTRAVENOUS
  Filled 2015-11-13: qty 2

## 2015-11-13 NOTE — Telephone Encounter (Signed)
Pt has been sch for today at 330pm °

## 2015-11-13 NOTE — Telephone Encounter (Signed)
Please call and schedule pt to come in today at 1:45 or 3:30. Someone will have to open slot for you.

## 2015-11-13 NOTE — Telephone Encounter (Signed)
Patient son called the nurse line and was transferred to the office to schedule appointment. Patient son states that she says odd things and kinda out of sort. Patient son would like for her to see Md today if possible.

## 2015-11-13 NOTE — ED Provider Notes (Signed)
CSN: [id number]     Arrival date & time 11/13/15  1638 History   First MD Initiated Contact with Patient 11/13/15 1727     Chief Complaint  Patient presents with  . Altered Mental Status  . Agitation     (Consider location/radiation/quality/duration/timing/severity/associated sxs/prior Treatment) HPI Comments: 81 year old female with extensive past medical history including aortic aneurysm repair, hyperlipidemia, rheumatoid arthritis, CAD status post CABG who presents with altered mental status. History limited because of the patient's confusion and obtained from family members. The patient's son and daughter report that she is normally highly functional and well put together but she has had worsening confusion since yesterday and was up most of the night with restlessness and picking at things. She has been repeating things and is only able to identify who they are but she is unable to answer questions. They deny any recent cough/cold symptoms, fevers, vomiting, or complaints of new pain. She has a history of frequent falls and always has bruises. No anticoagulant use. She was sent here by her PCP for further evaluation. Of note, the patient was recently on antibiotics for chronic leg wounds.   LEVEL 5 CAVEAT APPLIES 2/2 AMS  Patient is a 81 y.o. female presenting with altered mental status. The history is provided by a relative.  Altered Mental Status   Past Medical History  Diagnosis Date  . DEPRESSION 05/21/2007  . FIBROCYSTIC BREAST DISEASE, HX OF 05/21/2007  . HYPERLIPIDEMIA-MIXED 11/26/2008    takes Simvastatin daily  . Irritable bowel syndrome 05/21/2007  . KNEE PAIN 09/16/2007  . LEG EDEMA, BILATERAL 05/21/2009  . OSTEOPENIA 05/21/2007  . URTICARIA, CHRONIC 05/21/2007  . History of aortic aneurysm     resection and grafting of a 5.2-cm ascending aortic arch aneurysm August 2009  . Dyslipidemia   . Vitamin D deficiency   . Urticaria   . Neuropathy (HCC)     left radial   neuropathy with wrist drop  . Diverticulosis   . Irritable bowel syndrome   . GERD (gastroesophageal reflux disease)   . 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 Vaginal yeast infection     recently told to use monistat, hasn't started as of 01/26/2012  . HYPERTENSION 05/11/2008    takes Metoprolol and HCTZ daily  . Asthma     hx of;as a child  . Seasonal allergies     takes Hydroxyzine prn and Zyrtec 2 times a week  . Peripheral edema     takes Furosemide prn  . Pneumonia     history of;in the 90's  . Weakness     hands and feet  . Sciatica   . OSTEOARTHRITIS 01/20/2008    takes Methotrexate weekly  . DJD (degenerative joint disease)     history of   . Rheumatoid arthritis(714.0)   . Chronic back pain     takes Norco prn  . Bruises easily     takes Plaquenil daily  . CORONARY ATHEROSCLEROSIS, ARTERY BYPASS GRAFT 08/23/2008  . CAD (coronary artery disease)     s/p CABG -  x 1 with LIMA to LAD August 2009 /   . Urinary incontinence   . Bowel incontinence   . History of colon polyps   . Urinary frequency   . Urinary urgency   . Nocturia   . Anemia     takes folic acid bid  . Urinary incontinence     wears depends  . Hereditary and idiopathic  peripheral neuropathy 05/17/2015   Past Surgical History  Procedure Laterality Date  . Appendectomy    . Cesarean section  1968    x 1  . Hernia repair    . Coronary artery bypass graft  August 2009    CABG x 1 with LIMA to LAD /  . Ascending aortic aneurysm repair  August 2009     resection and grafting of a 5.2-cm ascending aortic arch aneurysm  . Joint replacement      2009 & 2010- both knees  . Back surgery      2011- cerv. fusion   . Breast surgery      biopsy x2- R breast  . Tonsillectomy      as a child  . Cardiac catheterization  2009  . Colonoscopy    . Lumbar laminectomy/decompression microdiscectomy Bilateral 07/11/2013    Procedure: LUMBAR LAMINECTOMY/DECOMPRESSION MICRODISCECTOMY LUMBAR FIVE SACRAL  ONE;  Surgeon: Mariam Dollar, MD;  Location: [facility];  Service: Neurosurgery;  Laterality: Bilateral;   Family History  Problem Relation Age of Onset  . Heart disease    . Coronary artery disease    . Anesthesia problems Neg Hx   . Hypotension Neg Hx   . Malignant hyperthermia Neg Hx   . Pseudochol deficiency Neg Hx   . Heart disease Mother   . Stroke Father   . Cancer Maternal Grandmother   . Stroke Paternal Grandmother    Social History  Substance Use Topics  . Smoking status: Never Smoker   . Smokeless tobacco: Never Used  . Alcohol Use: No   OB History    No data available     Review of Systems  Unable to perform ROS: Mental status change      Allergies  Amoxicillin; Cyclobenzaprine hcl; Mupirocin; Penicillins; and Tetanus toxoid  Home Medications   Prior to Admission medications   Medication Sig Start Date End Date Taking? Authorizing Provider  aspirin EC 81 MG tablet Take 81 mg by mouth at bedtime.    Yes Historical Provider, MD  cholecalciferol (VITAMIN D) 1000 UNITS tablet Take 1,000 Units by mouth daily.   Yes Historical Provider, MD  DENTA 5000 PLUS 1.1 % CREA dental cream BRUSH 3 TIMES A DAY 10/01/15  Yes Historical Provider, MD  folic acid (FOLVITE) 1 MG tablet Take 1 mg by mouth 2 (two) times daily.   Yes Historical Provider, MD  furosemide (LASIX) 40 MG tablet TAKE 1/2 TO 1 TABLET BY MOUTH AS NEEDED FOR SWELLING 10/12/15  Yes Gordy Savers, MD  hydrochlorothiazide (HYDRODIURIL) 25 MG tablet TAKE 1 TABLET BY MOUTH EVERY MORNING 06/18/15  Yes Gordy Savers, MD  HYDROcodone-acetaminophen (NORCO/VICODIN) 5-325 MG tablet Take 1 tablet by mouth every 6 (six) hours as needed for moderate pain. 10/26/15  Yes Gordy Savers, MD  hydroxychloroquine (PLAQUENIL) 200 MG tablet Take 200 mg by mouth 2 (two) times daily.    Yes Historical Provider, MD  methotrexate (RHEUMATREX) 2.5 MG tablet Take 15 mg by mouth once a week. Caution:Chemotherapy. Protect  from light. Take on Wednesday   Yes Historical Provider, MD  metoprolol succinate (TOPROL-XL) 50 MG 24 hr tablet TAKE 1 TABLET BY MOUTH EVERY DAY 10/16/15  Yes Gordy Savers, MD  Pediatric Multivit-Minerals-C (COMPLETE MULTI-VITAMIN PO) Take 1 tablet by mouth daily.   Yes Historical Provider, MD  silver sulfADIAZINE (SILVADENE) 1 % cream Apply 1 application topically daily. 10/29/15  Yes Gordy Savers, MD  simvastatin (ZOCOR) 40  MG tablet TAKE 1 TABLET BY MOUTH AT BEDTIME 02/22/15  Yes Gordy Savers, MD  acetaminophen (TYLENOL) 500 MG tablet Take 500 mg by mouth every 6 (six) hours as needed (for pain.).    Historical Provider, MD  calcium carbonate (TUMS - DOSED IN MG ELEMENTAL CALCIUM) 500 MG chewable tablet Chew 1 tablet by mouth daily as needed for indigestion or heartburn.    Historical Provider, MD  cetirizine (ZYRTEC) 10 MG tablet Take 10 mg by mouth daily as needed. Alternating with Hydroxyzine  For allergies    Historical Provider, MD  hydrOXYzine (ATARAX/VISTARIL) 25 MG tablet Take 25 mg by mouth as needed. For allergies alternate with Zyrtec    Historical Provider, MD  NASONEX 50 MCG/ACT nasal spray Place 2 sprays into the nose daily as needed (allergies).  06/22/15   Historical Provider, MD  potassium chloride SA (K-DUR,KLOR-CON) 20 MEQ tablet Take 20 mEq by mouth See admin instructions. Take 1 tablet daily as needed for low potassium. (Only take when needed with the lasix)    Historical Provider, MD   BP 155/58 mmHg  Pulse 59  Resp 16  SpO2 98% Physical Exam  Constitutional:  Thin, frail-appearing woman in no acute distress  HENT:  Head: Normocephalic.  B/l periorbital ecchymoses, Moist mucous membranes  Eyes: Conjunctivae are normal. Pupils are equal, round, and reactive to light.  Neck: Neck supple.  Cardiovascular: Normal rate, regular rhythm and normal heart sounds.   No murmur heard. Pulmonary/Chest: Effort normal and breath sounds normal.  Abdominal: Soft.  Bowel sounds are normal. She exhibits no distension. There is no tenderness.  Musculoskeletal: She exhibits no edema.  Neurological: She is alert.  Trying to pick at things and trying to turn things off, moving all 4 ext  Skin: Skin is warm and dry.  Multiple ecchymoses on face, arms, legs of varying ages; chronic leg wounds on b/l lower legs w/ no active drainage  Nursing note and vitals reviewed.   ED Course  Procedures (including critical care time) Labs Review Labs Reviewed  COMPREHENSIVE METABOLIC PANEL - Abnormal; Notable for the following:    Total Protein 6.4 (*)    Albumin 3.2 (*)    AST 68 (*)    ALT 55 (*)    All other components within normal limits  CBC WITH DIFFERENTIAL/PLATELET - Abnormal; Notable for the following:    WBC 3.5 (*)    RBC 3.57 (*)    Hemoglobin 9.8 (*)    HCT 30.4 (*)    RDW 17.1 (*)    Platelets 111 (*)    Lymphs Abs 0.5 (*)    All other components within normal limits  URINALYSIS, ROUTINE W REFLEX MICROSCOPIC (NOT AT [facility]) - Abnormal; Notable for the following:    APPearance CLOUDY (*)    Ketones, ur 40 (*)    Leukocytes, UA MODERATE (*)    All other components within normal limits  URINE MICROSCOPIC-ADD ON - Abnormal; Notable for the following:    Squamous Epithelial / LPF 0-5 (*)    Bacteria, UA MANY (*)    Crystals TRIPLE PHOSPHATE CRYSTALS (*)    All other components within normal limits  URINE CULTURE  I-STAT CG4 LACTIC ACID, ED    Imaging Review Dg Chest 2 View  11/13/2015  CLINICAL DATA:  Hypertension.  Confusion and agitation. EXAM: CHEST  2 VIEW COMPARISON:  July 04, 2013 FINDINGS: Areas no edema or consolidation. Heart is mildly enlarged with pulmonary vascularity within normal limits.  Patient is status post internal mammary bypass grafting. Aorta is tortuous. No adenopathy. No pneumothorax. There are healing fractures of the right posterior sixth and seventh ribs. No acute fracture is apparent. IMPRESSION: Healing fractures of  the right posterior sixth and seventh ribs. No pneumothorax. No edema or consolidation. Stable cardiac prominence and aortic tortuosity. Electronically Signed   By: Bretta Bang III M.D.   On: 11/13/2015 18:59   Ct Head Wo Contrast  11/13/2015  CLINICAL DATA:  Altered mental status.  Recent falls EXAM: CT HEAD WITHOUT CONTRAST CT CERVICAL SPINE WITHOUT CONTRAST TECHNIQUE: Multidetector CT imaging of the head and cervical spine was performed following the standard protocol without intravenous contrast. Multiplanar CT image reconstructions of the cervical spine were also generated. COMPARISON:  CT cervical spine December 27, 2014; cervical MRI May 08, 2015; head CT March 18, 2015 FINDINGS: CT HEAD FINDINGS There is age related volume loss. There is no intracranial mass, hemorrhage, extra-axial fluid collection, or midline shift. There is slight small vessel disease in the centra semiovale bilaterally. There is no new gray-white compartment lesion. No acute infarct evident. Bony calvarium appears intact. The mastoid air cells are clear. No intraorbital lesions are identified. There is rightward deviation of the nasal septum. There is a slight right frontal scalp hematoma. CT CERVICAL SPINE FINDINGS A degree of patient motion makes this study less than optimal. The patient is status post anterior screw and plate fixation from C4-C6, stable. The screw and plate fixation device appears intact as do disc spacers at C4-5 and C5-6. There is no demonstrable acute fracture. There is slight anterolisthesis of C3 on C4, likely due to spondylosis. The prevertebral soft tissues and predental space regions are normal. There is moderately severe disc space narrowing at all levels. There is facet hypertrophy at multiple levels, tending to be more severe on the right than on the left. There is no frank disc extrusion or stenosis. There is exit foraminal narrowing at multiple levels due to bony hypertrophy. IMPRESSION: CT  head: Age related volume loss with mild periventricular small vessel disease. No intracranial mass, hemorrhage, or extra-axial fluid collection. No evidence of acute infarct. Small right frontal scalp hematoma. CT cervical spine: Postoperative change from C4-C6. No fracture. Slight spondylolisthesis at C3-4, felt to be due to underlying spondylosis. No other spondylolisthesis. Multilevel arthropathy. Note that a degree of motion artifact makes this study somewhat less than optimal. Electronically Signed   By: Bretta Bang III M.D.   On: 11/13/2015 18:43   Ct Cervical Spine Wo Contrast  11/13/2015  CLINICAL DATA:  Altered mental status.  Recent falls EXAM: CT HEAD WITHOUT CONTRAST CT CERVICAL SPINE WITHOUT CONTRAST TECHNIQUE: Multidetector CT imaging of the head and cervical spine was performed following the standard protocol without intravenous contrast. Multiplanar CT image reconstructions of the cervical spine were also generated. COMPARISON:  CT cervical spine December 27, 2014; cervical MRI May 08, 2015; head CT March 18, 2015 FINDINGS: CT HEAD FINDINGS There is age related volume loss. There is no intracranial mass, hemorrhage, extra-axial fluid collection, or midline shift. There is slight small vessel disease in the centra semiovale bilaterally. There is no new gray-white compartment lesion. No acute infarct evident. Bony calvarium appears intact. The mastoid air cells are clear. No intraorbital lesions are identified. There is rightward deviation of the nasal septum. There is a slight right frontal scalp hematoma. CT CERVICAL SPINE FINDINGS A degree of patient motion makes this study less than optimal. The patient is status  post anterior screw and plate fixation from C4-C6, stable. The screw and plate fixation device appears intact as do disc spacers at C4-5 and C5-6. There is no demonstrable acute fracture. There is slight anterolisthesis of C3 on C4, likely due to spondylosis. The prevertebral  soft tissues and predental space regions are normal. There is moderately severe disc space narrowing at all levels. There is facet hypertrophy at multiple levels, tending to be more severe on the right than on the left. There is no frank disc extrusion or stenosis. There is exit foraminal narrowing at multiple levels due to bony hypertrophy. IMPRESSION: CT head: Age related volume loss with mild periventricular small vessel disease. No intracranial mass, hemorrhage, or extra-axial fluid collection. No evidence of acute infarct. Small right frontal scalp hematoma. CT cervical spine: Postoperative change from C4-C6. No fracture. Slight spondylolisthesis at C3-4, felt to be due to underlying spondylosis. No other spondylolisthesis. Multilevel arthropathy. Note that a degree of motion artifact makes this study somewhat less than optimal. Electronically Signed   By: Bretta Bang III M.D.   On: 11/13/2015 18:43   I have personally reviewed and evaluated these lab results as part of my medical decision-making.   EKG Interpretation None     Medications  cefTRIAXone (ROCEPHIN) 1 g in dextrose 5 % 50 mL IVPB (1 g Intravenous New Bag/Given 11/13/15 1923)  sodium chloride 0.9 % bolus 1,000 mL (1,000 mLs Intravenous New Bag/Given 11/13/15 1923)    MDM   Final diagnoses:  Urinary tract infection in elderly patient  Confusion    Pt p/w increasing confusion and AMS noted by family members today. On exam, pt w/ stable VS, frail appearing but NAD. She had multiple ecchymoses of varying ages including on her face but was moving all 4 extremities. She was able to identify family members but unable to answer any further questions. No abdominal tenderness. Obtained above lab work including UA and also obtained chest x-ray to evaluate for infection. Given her history of frequent falls, obtained CT of head and C-spine to rule out intracranial process.  CTs negative for acute injury. Chest x-ray shows old rib  fractures but no acute process. Labs show WBC 3.5, normal creatinine, very mildly elevated LFTs, UA consistent with infection. U culture sent, gave CTX and IVF bolus. The patient's confusion may be related in part to urinary tract infection. Because of the degree of her confusion and inability to function at home, I discussed admission with [facility] hospitalist, Dr. Antionette Char, and pt admitted for further care.  Laurence Spates, MD 11/13/15 [id number]

## 2015-11-13 NOTE — Progress Notes (Signed)
Pre visit review using our clinic review tool, if applicable. No additional management support is needed unless otherwise documented below in the visit note. 

## 2015-11-13 NOTE — ED Notes (Signed)
Bed: WA10 Expected date:  Expected time:  Means of arrival:  Comments: Triage 1   

## 2015-11-13 NOTE — Patient Instructions (Signed)
Report to the emergency room immediately for further evaluation and management 

## 2015-11-13 NOTE — Telephone Encounter (Signed)
Homero Fellers, the pt's son called back saying she's still experiencing hallucinations today and he's wondering if she can be worked in. Initially he only wanted her to see Dr. Kirtland Bouchard but said it's ok if Dr. Clent Ridges can work her in as well. He'd like a phone call regarding this.  Geoff's ph# [phone] Thank you.

## 2015-11-13 NOTE — Progress Notes (Signed)
EDCM went to speak to patient and family at bedside about home health services, however, patient was taken to unit.

## 2015-11-13 NOTE — Progress Notes (Signed)
Subjective:    Patient ID: [name], female    DOB: 09-19-1932, 81 y.o.   MRN: [id number]  HPI  81 year old patient brought in by the family as a work and due to acute confusional state.  The patient was first noted to be confused.  Approximately 24 hours ago.  She has a history of unsteady gait and multiple falls.  No clear history of recent falls or head trauma.  She has been attended and has been quite confused.  The patient has been supervised for the past 24 hours and has not been treated with any medications  Past Medical History  Diagnosis Date  . DEPRESSION 05/21/2007  . FIBROCYSTIC BREAST DISEASE, HX OF 05/21/2007  . HYPERLIPIDEMIA-MIXED 11/26/2008    takes Simvastatin daily  . Irritable bowel syndrome 05/21/2007  . KNEE PAIN 09/16/2007  . LEG EDEMA, BILATERAL 05/21/2009  . OSTEOPENIA 05/21/2007  . URTICARIA, CHRONIC 05/21/2007  . History of aortic aneurysm     resection and grafting of a 5.2-cm ascending aortic arch aneurysm August 2009  . Dyslipidemia   . Vitamin D deficiency   . Urticaria   . Neuropathy (HCC)     left radial  neuropathy with wrist drop  . Diverticulosis   . Irritable bowel syndrome   . GERD (gastroesophageal reflux disease)   . 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 Vaginal yeast infection     recently told to use monistat, hasn't started as of 01/26/2012  . HYPERTENSION 05/11/2008    takes Metoprolol and HCTZ daily  . Asthma     hx of;as a child  . Seasonal allergies     takes Hydroxyzine prn and Zyrtec 2 times a week  . Peripheral edema     takes Furosemide prn  . Pneumonia     history of;in the 90's  . Weakness     hands and feet  . Sciatica   . OSTEOARTHRITIS 01/20/2008    takes Methotrexate weekly  . DJD (degenerative joint disease)     history of   . Rheumatoid arthritis(714.0)   . Chronic back pain     takes Norco prn  . Bruises easily     takes Plaquenil daily  . CORONARY ATHEROSCLEROSIS, ARTERY BYPASS GRAFT  08/23/2008  . CAD (coronary artery disease)     s/p CABG -  x 1 with LIMA to LAD August 2009 /   . Urinary incontinence   . Bowel incontinence   . History of colon polyps   . Urinary frequency   . Urinary urgency   . Nocturia   . Anemia     takes folic acid bid  . Urinary incontinence     wears depends  . Hereditary and idiopathic peripheral neuropathy 05/17/2015    Social History   Social History  . Marital Status: Widowed    Spouse Name: N/A  . Number of Children: 2  . Years of Education: N/A   Occupational History  . retired    Social History Main Topics  . Smoking status: Never Smoker   . Smokeless tobacco: Never Used  . Alcohol Use: No  . Drug Use: No  . Sexual Activity: No   Other Topics Concern  . Not on file   Social History Narrative   Patient is right handed.   Patient drinks a little caffeine daily.    Past Surgical History  Procedure Laterality Date  . Appendectomy    .  Cesarean section  1968    x 1  . Hernia repair    . Coronary artery bypass graft  August 2009    CABG x 1 with LIMA to LAD /  . Ascending aortic aneurysm repair  August 2009     resection and grafting of a 5.2-cm ascending aortic arch aneurysm  . Joint replacement      2009 & 2010- both knees  . Back surgery      2011- cerv. fusion   . Breast surgery      biopsy x2- R breast  . Tonsillectomy      as a child  . Cardiac catheterization  2009  . Colonoscopy    . Lumbar laminectomy/decompression microdiscectomy Bilateral 07/11/2013    Procedure: LUMBAR LAMINECTOMY/DECOMPRESSION MICRODISCECTOMY LUMBAR FIVE SACRAL ONE;  Surgeon: Mariam Dollar, MD;  Location: [facility];  Service: Neurosurgery;  Laterality: Bilateral;    Family History  Problem Relation Age of Onset  . Heart disease    . Coronary artery disease    . Anesthesia problems Neg Hx   . Hypotension Neg Hx   . Malignant hyperthermia Neg Hx   . Pseudochol deficiency Neg Hx   . Heart disease Mother   . Stroke Father     . Cancer Maternal Grandmother   . Stroke Paternal Grandmother     Allergies  Allergen Reactions  . Amoxicillin     REACTION: unspecified  . Cyclobenzaprine Hcl     REACTION: rash  . Mupirocin     Unknown reaction  . Penicillins Hives  . Tetanus Toxoid Hives    Current Outpatient Prescriptions on File Prior to Visit  Medication Sig Dispense Refill  . acetaminophen (TYLENOL) 500 MG tablet Take 500 mg by mouth every 6 (six) hours as needed (for pain.).    Marland Kitchen aspirin EC 81 MG tablet Take 81 mg by mouth at bedtime.     . calcium carbonate (TUMS - DOSED IN MG ELEMENTAL CALCIUM) 500 MG chewable tablet Chew 1 tablet by mouth daily as needed for indigestion or heartburn.    . cetirizine (ZYRTEC) 10 MG tablet Take 10 mg by mouth daily as needed. Alternating with Hydroxyzine  For allergies    . cholecalciferol (VITAMIN D) 1000 UNITS tablet Take 1,000 Units by mouth daily.    . DENTA 5000 PLUS 1.1 % CREA dental cream BRUSH 3 TIMES A DAY  4  . folic acid (FOLVITE) 1 MG tablet Take 1 mg by mouth 2 (two) times daily.    . furosemide (LASIX) 40 MG tablet TAKE 1/2 TO 1 TABLET BY MOUTH AS NEEDED FOR SWELLING 30 tablet 2  . hydrochlorothiazide (HYDRODIURIL) 25 MG tablet TAKE 1 TABLET BY MOUTH EVERY MORNING 90 tablet 1  . HYDROcodone-acetaminophen (NORCO/VICODIN) 5-325 MG tablet Take 1 tablet by mouth every 6 (six) hours as needed for moderate pain. 60 tablet 0  . hydroxychloroquine (PLAQUENIL) 200 MG tablet Take 200 mg by mouth 2 (two) times daily.     . hydrOXYzine (ATARAX/VISTARIL) 25 MG tablet Take 25 mg by mouth as needed. For allergies alternate with Zyrtec    . methotrexate (RHEUMATREX) 2.5 MG tablet Take 15 mg by mouth once a week. Caution:Chemotherapy. Protect from light. Take on Wednesday    . metoprolol succinate (TOPROL-XL) 50 MG 24 hr tablet TAKE 1 TABLET BY MOUTH EVERY DAY 30 tablet 5  . NASONEX 50 MCG/ACT nasal spray Place 2 sprays into the nose as needed.   5  .  Pediatric  Multivit-Minerals-C (COMPLETE MULTI-VITAMIN PO) Take 1 tablet by mouth daily.    . potassium chloride SA (K-DUR,KLOR-CON) 20 MEQ tablet Take 20 mEq by mouth See admin instructions. Take 1 tablet daily as needed for low potassium. (Only take when needed with the lasix)    . silver sulfADIAZINE (SILVADENE) 1 % cream Apply 1 application topically daily. 50 g 0  . simvastatin (ZOCOR) 40 MG tablet TAKE 1 TABLET BY MOUTH AT BEDTIME 30 tablet 5   No current facility-administered medications on file prior to visit.    BP 140/70 mmHg  Pulse 60  Resp 18  SpO2 98%     Review of Systems  Constitutional: Positive for fatigue.  HENT: Negative for congestion, dental problem, hearing loss, rhinorrhea, sinus pressure, sore throat and tinnitus.   Eyes: Negative for pain, discharge and visual disturbance.  Respiratory: Negative for cough and shortness of breath.   Cardiovascular: Negative for chest pain, palpitations and leg swelling.  Gastrointestinal: Negative for nausea, vomiting, abdominal pain, diarrhea, constipation, blood in stool and abdominal distention.  Genitourinary: Negative for dysuria, urgency, frequency, hematuria, flank pain, vaginal bleeding, vaginal discharge, difficulty urinating, vaginal pain and pelvic pain.  Musculoskeletal: Negative for joint swelling, arthralgias and gait problem.  Skin: Negative for rash.  Neurological: Positive for weakness. Negative for dizziness, syncope, speech difficulty, numbness and headaches.  Hematological: Negative for adenopathy.  Psychiatric/Behavioral: Positive for behavioral problems, confusion and decreased concentration. Negative for dysphoric mood and agitation. The patient is not nervous/anxious.        Objective:   Physical Exam  Constitutional: She is oriented to person, place, and time. She appears well-developed and well-nourished.  Weak, somnolent, wheelchair bound Oriented to place   HENT:  Head: Normocephalic.  Right Ear:  External ear normal.  Left Ear: External ear normal.  Mouth/Throat: Oropharynx is clear and moist.  Eyes: Conjunctivae and EOM are normal. Pupils are equal, round, and reactive to light.  Neck: Normal range of motion. Neck supple. No thyromegaly present.  Cardiovascular: Normal rate, regular rhythm, normal heart sounds and intact distal pulses.   No tachycardia  Pulmonary/Chest: Effort normal and breath sounds normal.  O2 saturation 98  Abdominal: Soft. Bowel sounds are normal. She exhibits no mass. There is no tenderness.  Musculoskeletal: Normal range of motion.  Lymphadenopathy:    She has no cervical adenopathy.  Neurological: She is alert and oriented to person, place, and time.  Somnolent Exam nonfocal the generally weak No drift of the outstretched arms  Wheelchair bound.  Slight weakness left leg but this is chronic  No facial asymmetry  Skin: Skin is warm and dry. No rash noted.  Left peri Orbital bruising  Psychiatric: She has a normal mood and affect. Her behavior is normal.          Assessment & Plan:   Acute confusional state.  Rule out acute CVA, subdural hematoma, metabolic derangement.  Discussed with the family who are in attendance.  They are agreeable to take the patient to the ED immediately for head CT screening lab in hospital admission

## 2015-11-13 NOTE — Telephone Encounter (Signed)
Asked patient son if she would be willing to see another provider stated yes then changed mind and only wants to see MD. Patient has an appointment schedule for Friday as well.

## 2015-11-13 NOTE — Telephone Encounter (Signed)
Noted  

## 2015-11-13 NOTE — ED Notes (Addendum)
Pt saw pcp today, sent to ED. Pt has confusion and increased agitation since yesterday. Family reports pt lives alone and falls often.pt has bruises to face. Pt denies pain. Pt keeps repeating "turn it off" and fidgeting. Pt denies pain.   Pt recently on abx for leg wounds. Unable to get a oral temperature on pt, pt cannot follow directions to get temperature.

## 2015-11-13 NOTE — H&P (Addendum)
[facility] History and Physical  [name] KJZ:[id number] DOB: 1932-02-25 DOA: 11/13/2015  Referring physician: ED physician PCP: [name], MD  Specialists:   Chief Complaint: Confusion, agitation  HPI: [name] is medically-complex a 81 y.o. female with PMH of ASCVD status post AAA repair and CABG, rheumatoid arthritis, and chronic low back pain who is brought to Colleen ED by family with concerns regarding acute confusion and agitation. Given Colleen patient's acute confusional state, she is unable to provide a history at this time and so information obtained from [name], Foley, and ED physician. Patient lives alone and typically cares for herself, but has family members checking in on her daily. 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 On 11/12/2015, at about 6 PM, Colleen patient was visited by a niece who noted Colleen patient to be confused and called [name] to inform him of this. [name] was taken over to her Foley's house where she spent Colleen night, reportedly fidgeting and repeating, "I have to turn it off" throughout Colleen night without sleep. She has a wobbly gait at baseline and falls frequently, but her family does not believe there was an acute trauma. She has not complained of fevers, chills, confusion, dysuria, chest pain, headaches, or change in bowel habits in Colleen days leading up to her presentation. She administers her own medications, including Norco, methotrexate, and Plaquenil, which her family states she has never had a problem with before. Patient's family denies any prior presentation like this.  In ED, patient was found to saturating well on room air with vital signs stable. She was in no acute distress, but disoriented. A noncontrast CT of Colleen head and neck were obtained, and there was a small right frontal scalp  hematoma, but Colleen studies were otherwise negative for acute processes. Metabolic panel revealed mild elevation in Colleen transaminases and a mild hypoalbuminemia, but Colleen electrolytes and renal function were within Colleen normal limits. CBC demonstrates a stable normocytic anemia and leukopenia and lactic acid is 1.0. Urinalysis revealed many bacteria and positive leukocytes, as well as triple phosphate crystals, and will be sent for culture. Patient was given IV fluids and an empiric dose of Rocephin for suspected urinary tract infection and Colleen hospitalists were asked to admit.  Where does patient live?   At home    Can patient participate in ADLs?  Yes    Review of Systems:   Unable to obtain ROS given Colleen patient's acute confusional state.   Allergy:  Allergies  Allergen Reactions  . Amoxicillin     REACTION: unspecified  . Cyclobenzaprine Hcl     REACTION: rash  . Mupirocin     Unknown reaction  . Penicillins Hives  . Tetanus Toxoid Hives    Past Medical History  Diagnosis Date  . DEPRESSION 05/21/2007  . FIBROCYSTIC BREAST DISEASE, HX OF 05/21/2007  . HYPERLIPIDEMIA-MIXED 11/26/2008    takes Simvastatin daily  . Irritable bowel syndrome 05/21/2007  . KNEE PAIN 09/16/2007  . LEG EDEMA, BILATERAL 05/21/2009  . OSTEOPENIA 05/21/2007  . URTICARIA, CHRONIC 05/21/2007  . History of aortic aneurysm     resection and grafting of a 5.2-cm ascending aortic arch aneurysm August 2009  . Dyslipidemia   . Vitamin D deficiency   . Urticaria   . Neuropathy (HCC)     left radial  neuropathy with wrist drop  .  Diverticulosis   . Irritable bowel syndrome   . GERD (gastroesophageal reflux disease)   . 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 Vaginal yeast infection     recently told to use monistat, hasn't started as of 01/26/2012  . HYPERTENSION 05/11/2008    takes Metoprolol and HCTZ daily  . Asthma     hx of;as a child  . Seasonal allergies     takes Hydroxyzine prn and Zyrtec 2  times a week  . Peripheral edema     takes Furosemide prn  . Pneumonia     history of;in Colleen 90's  . Weakness     hands and feet  . Sciatica   . OSTEOARTHRITIS 01/20/2008    takes Methotrexate weekly  . DJD (degenerative joint disease)     history of   . Rheumatoid arthritis(714.0)   . Chronic back pain     takes Norco prn  . Bruises easily     takes Plaquenil daily  . CORONARY ATHEROSCLEROSIS, ARTERY BYPASS GRAFT 08/23/2008  . CAD (coronary artery disease)     s/p CABG -  x 1 with LIMA to LAD August 2009 /   . Urinary incontinence   . Bowel incontinence   . History of colon polyps   . Urinary frequency   . Urinary urgency   . Nocturia   . Anemia     takes folic acid bid  . Urinary incontinence     wears depends  . Hereditary and idiopathic peripheral neuropathy 05/17/2015    Past Surgical History  Procedure Laterality Date  . Appendectomy    . Cesarean section  1968    x 1  . Hernia repair    . Coronary artery bypass graft  August 2009    CABG x 1 with LIMA to LAD /  . Ascending aortic aneurysm repair  August 2009     resection and grafting of a 5.2-cm ascending aortic arch aneurysm  . Joint replacement      2009 & 2010- both knees  . Back surgery      2011- cerv. fusion   . Breast surgery      biopsy x2- R breast  . Tonsillectomy      as a child  . Cardiac catheterization  2009  . Colonoscopy    . Lumbar laminectomy/decompression microdiscectomy Bilateral 07/11/2013    Procedure: LUMBAR LAMINECTOMY/DECOMPRESSION MICRODISCECTOMY LUMBAR FIVE SACRAL ONE;  Surgeon: Elaina Hoops, MD;  Location: [facility];  Service: Neurosurgery;  Laterality: Bilateral;    Social History:  reports that she has never smoked. She has never used smokeless tobacco. She reports that she does not drink alcohol or use illicit drugs.  Family History:  Family History  Problem Relation Age of Onset  . Heart disease    . Coronary artery disease    . Anesthesia problems Neg Hx   .  Hypotension Neg Hx   . Malignant hyperthermia Neg Hx   . Pseudochol deficiency Neg Hx   . Heart disease Mother   . Stroke Father   . Cancer Maternal Grandmother   . Stroke Paternal Grandmother      Prior to Admission medications   Medication Sig Start Date End Date Taking? Authorizing Provider  aspirin EC 81 MG tablet Take 81 mg by mouth at bedtime.    Yes Historical Provider, MD  cholecalciferol (VITAMIN D) 1000 UNITS tablet Take 1,000 Units by mouth daily.  Yes Historical Provider, MD  DENTA 5000 PLUS 1.1 % CREA dental cream BRUSH 3 TIMES A DAY 10/01/15  Yes Historical Provider, MD  folic acid (FOLVITE) 1 MG tablet Take 1 mg by mouth 2 (two) times daily.   Yes Historical Provider, MD  furosemide (LASIX) 40 MG tablet TAKE 1/2 TO 1 TABLET BY MOUTH AS NEEDED FOR SWELLING 10/12/15  Yes Marletta Lor, MD  hydrochlorothiazide (HYDRODIURIL) 25 MG tablet TAKE 1 TABLET BY MOUTH EVERY MORNING 06/18/15  Yes Marletta Lor, MD  HYDROcodone-acetaminophen (NORCO/VICODIN) 5-325 MG tablet Take 1 tablet by mouth every 6 (six) hours as needed for moderate pain. 10/26/15  Yes Marletta Lor, MD  hydroxychloroquine (PLAQUENIL) 200 MG tablet Take 200 mg by mouth 2 (two) times daily.    Yes Historical Provider, MD  methotrexate (RHEUMATREX) 2.5 MG tablet Take 15 mg by mouth once a week. Caution:Chemotherapy. Protect from light. Take on Wednesday   Yes Historical Provider, MD  metoprolol succinate (TOPROL-XL) 50 MG 24 hr tablet TAKE 1 TABLET BY MOUTH EVERY DAY 10/16/15  Yes Marletta Lor, MD  Pediatric Multivit-Minerals-C (COMPLETE MULTI-VITAMIN PO) Take 1 tablet by mouth daily.   Yes Historical Provider, MD  silver sulfADIAZINE (SILVADENE) 1 % cream Apply 1 application topically daily. 10/29/15  Yes Marletta Lor, MD  simvastatin (ZOCOR) 40 MG tablet TAKE 1 TABLET BY MOUTH AT BEDTIME 02/22/15  Yes Marletta Lor, MD  acetaminophen (TYLENOL) 500 MG tablet Take 500 mg by mouth every 6  (six) hours as needed (for pain.).    Historical Provider, MD  calcium carbonate (TUMS - DOSED IN MG ELEMENTAL CALCIUM) 500 MG chewable tablet Chew 1 tablet by mouth daily as needed for indigestion or heartburn.    Historical Provider, MD  cetirizine (ZYRTEC) 10 MG tablet Take 10 mg by mouth daily as needed. Alternating with Hydroxyzine  For allergies    Historical Provider, MD  hydrOXYzine (ATARAX/VISTARIL) 25 MG tablet Take 25 mg by mouth as needed. For allergies alternate with Zyrtec    Historical Provider, MD  NASONEX 50 MCG/ACT nasal spray Place 2 sprays into Colleen nose daily as needed (allergies).  06/22/15   Historical Provider, MD  potassium chloride SA (K-DUR,KLOR-CON) 20 MEQ tablet Take 20 mEq by mouth See admin instructions. Take 1 tablet daily as needed for low potassium. (Only take when needed with Colleen lasix)    Historical Provider, MD    Physical Exam: Filed Vitals:   11/13/15 1701 11/13/15 1812  BP: 151/69 155/58  Pulse: 95 59  TempSrc: Oral   Resp:  16  SpO2: 100% 98%   General: Not in acute distress, resting, arousable HEENT:       Eyes: PERRL, EOMI, no scleral icterus or conjunctival pallor.       ENT: No discharge from Colleen ears or nose, no pharyngeal ulcers, petechiae or exudate, no tonsillar enlargement.        Neck: No JVD, no bruit, no appreciable mass Heme: No cervical adenopathy, no pallor Cardiac: Rate 60 and regular, with hard snap for S2 and a grade 2 midsystolic murmur at Colleen lower sternal borders, No gallops or rubs. Pulm: Good air movement bilaterally. No rales, wheezing, rhonchi or rubs. Abd: Soft, nondistended, nontender, no rebound pain or gaurding, no mass or organomegaly, BS present. Ext: Bilateral lower extremities with 1+ pitting edema, hyperpigmentation in a gaiter distribution with overlying ulcers in various stages of healing. There is prominent erythema surrounding Colleen right lower extremity ulcers, but  no foul odor or purulent discharge.   Musculoskeletal: No gross deformity, no red, hot, swollen joints  Skin: Aside from Colleen extremity findings above, No rashes or wounds on exposed surfaces  Neuro: Somnolent, arousable, oriented to person only, pupils equal, round, and reactive to light. Moving all extremities spontaneously. Patellar tendon DTRs 1+ and symmetric bilaterally. Negative Babinski. Psych: Confused, with exam limited at this time due to patient's somnolence.  Labs on Admission:  Basic Metabolic Panel:  Recent Labs Lab 11/13/15 1737  NA 144  K 3.8  CL 101  CO2 31  GLUCOSE 99  BUN 19  CREATININE 0.54  CALCIUM 9.3   Liver Function Tests:  Recent Labs Lab 11/13/15 1737  AST 68*  ALT 55*  ALKPHOS 100  BILITOT 1.1  PROT 6.4*  ALBUMIN 3.2*   No results for input(s): LIPASE, AMYLASE in Colleen last 168 hours. No results for input(s): AMMONIA in Colleen last 168 hours. CBC:  Recent Labs Lab 11/13/15 1737  WBC 3.5*  NEUTROABS 2.7  HGB 9.8*  HCT 30.4*  MCV 85.2  PLT 111*   Cardiac Enzymes: No results for input(s): CKTOTAL, CKMB, CKMBINDEX, TROPONINI in Colleen last 168 hours.  BNP (last 3 results) No results for input(s): BNP in Colleen last 8760 hours.  ProBNP (last 3 results) No results for input(s): PROBNP in Colleen last 8760 hours.  CBG: No results for input(s): GLUCAP in Colleen last 168 hours.  Radiological Exams on Admission: Dg Chest 2 View  11/13/2015  CLINICAL DATA:  Hypertension.  Confusion and agitation. EXAM: CHEST  2 VIEW COMPARISON:  July 04, 2013 FINDINGS: Areas no edema or consolidation. Heart is mildly enlarged with pulmonary vascularity within normal limits. Patient is status post internal mammary bypass grafting. Aorta is tortuous. No adenopathy. No pneumothorax. There are healing fractures of Colleen right posterior sixth and seventh ribs. No acute fracture is apparent. IMPRESSION: Healing fractures of Colleen right posterior sixth and seventh ribs. No pneumothorax. No edema or consolidation.  Stable cardiac prominence and aortic tortuosity. Electronically Signed   By: Lowella Grip III M.D.   On: 11/13/2015 18:59   Ct Head Wo Contrast  11/13/2015  CLINICAL DATA:  Altered mental status.  Recent falls EXAM: CT HEAD WITHOUT CONTRAST CT CERVICAL SPINE WITHOUT CONTRAST TECHNIQUE: Multidetector CT imaging of Colleen head and cervical spine was performed following Colleen standard protocol without intravenous contrast. Multiplanar CT image reconstructions of Colleen cervical spine were also generated. COMPARISON:  CT cervical spine December 27, 2014; cervical MRI May 08, 2015; head CT March 18, 2015 FINDINGS: CT HEAD FINDINGS There is age related volume loss. There is no intracranial mass, hemorrhage, extra-axial fluid collection, or midline shift. There is slight small vessel disease in Colleen centra semiovale bilaterally. There is no new gray-white compartment lesion. No acute infarct evident. Bony calvarium appears intact. Colleen mastoid air cells are clear. No intraorbital lesions are identified. There is rightward deviation of Colleen nasal septum. There is a slight right frontal scalp hematoma. CT CERVICAL SPINE FINDINGS A degree of patient motion makes this study less than optimal. Colleen patient is status post anterior screw and plate fixation from I6-E7, stable. Colleen screw and plate fixation device appears intact as do disc spacers at C4-5 and C5-6. There is no demonstrable acute fracture. There is slight anterolisthesis of C3 on C4, likely due to spondylosis. Colleen prevertebral soft tissues and predental space regions are normal. There is moderately severe disc space narrowing at all levels. There is facet hypertrophy  at multiple levels, tending to be more severe on Colleen right than on Colleen left. There is no frank disc extrusion or stenosis. There is exit foraminal narrowing at multiple levels due to bony hypertrophy. IMPRESSION: CT head: Age related volume loss with mild periventricular small vessel disease. No  intracranial mass, hemorrhage, or extra-axial fluid collection. No evidence of acute infarct. Small right frontal scalp hematoma. CT cervical spine: Postoperative change from C4-C6. No fracture. Slight spondylolisthesis at C3-4, felt to be due to underlying spondylosis. No other spondylolisthesis. Multilevel arthropathy. Note that a degree of motion artifact makes this study somewhat less than optimal. Electronically Signed   By: Lowella Grip III M.D.   On: 11/13/2015 18:43   Ct Cervical Spine Wo Contrast  11/13/2015  CLINICAL DATA:  Altered mental status.  Recent falls EXAM: CT HEAD WITHOUT CONTRAST CT CERVICAL SPINE WITHOUT CONTRAST TECHNIQUE: Multidetector CT imaging of Colleen head and cervical spine was performed following Colleen standard protocol without intravenous contrast. Multiplanar CT image reconstructions of Colleen cervical spine were also generated. COMPARISON:  CT cervical spine December 27, 2014; cervical MRI May 08, 2015; head CT March 18, 2015 FINDINGS: CT HEAD FINDINGS There is age related volume loss. There is no intracranial mass, hemorrhage, extra-axial fluid collection, or midline shift. There is slight small vessel disease in Colleen centra semiovale bilaterally. There is no new gray-white compartment lesion. No acute infarct evident. Bony calvarium appears intact. Colleen mastoid air cells are clear. No intraorbital lesions are identified. There is rightward deviation of Colleen nasal septum. There is a slight right frontal scalp hematoma. CT CERVICAL SPINE FINDINGS A degree of patient motion makes this study less than optimal. Colleen patient is status post anterior screw and plate fixation from P3-X9, stable. Colleen screw and plate fixation device appears intact as do disc spacers at C4-5 and C5-6. There is no demonstrable acute fracture. There is slight anterolisthesis of C3 on C4, likely due to spondylosis. Colleen prevertebral soft tissues and predental space regions are normal. There is moderately severe disc  space narrowing at all levels. There is facet hypertrophy at multiple levels, tending to be more severe on Colleen right than on Colleen left. There is no frank disc extrusion or stenosis. There is exit foraminal narrowing at multiple levels due to bony hypertrophy. IMPRESSION: CT head: Age related volume loss with mild periventricular small vessel disease. No intracranial mass, hemorrhage, or extra-axial fluid collection. No evidence of acute infarct. Small right frontal scalp hematoma. CT cervical spine: Postoperative change from C4-C6. No fracture. Slight spondylolisthesis at C3-4, felt to be due to underlying spondylosis. No other spondylolisthesis. Multilevel arthropathy. Note that a degree of motion artifact makes this study somewhat less than optimal. Electronically Signed   By: Lowella Grip III M.D.   On: 11/13/2015 18:43    EKG:  Not done in ED, ordered and pending  Assessment/Plan  1. Acute confusional state - Urinalysis is suggestive of UTI and patient received an empiric dose of Rocephin in Colleen ED - We will continue treating UTI empirically with urine culture pending - No obvious systemic infection, though evaluation of WBC difficult in setting of chronic leukopenia, and RN unable to obtain oral temp, will perform rectal - Differential diagnosis is quite broad, and just below UTI, medication effect or polypharmacy should be considered; will also evaluate for vitamin deficiency, particularly folate in Colleen setting of methotrexate use - Lower extremity wounds represent a less likely source of infection given stable appearance - CT head  negative for acute process, no focal neurologic deficits identified to suggest acute CVA - Check TSH, B12, folate, ammonia, ABG, RPR, and carboxyhemoglobin - Chek methotrexate level - Hold methotrexate, Plaquenil, Norco, and Atarax for now. Consider resuming tomorrow based on clinical course - Patient is clinically dry and will be gently hydrated with IVF while  holding her home Lasix - Keep nothing by mouth until swallow eval given potential risk for aspiration - Suspect Colleen patient will require closer supervision upon discharge  2. Urinary tract infection - UA suggestive of infection, culture pending - Continue empiric treatment with Rocephin, tailor according to culture data  3. Rheumatoid arthritis - Holding methotrexate and Plaquenil for now - Chek methotrexate level and folic acid  4. Hypertension - Normotensive currently - We will convert her Toprol-XL to IV metoprolol while nothing by mouth, with holding parameters for bradycardia or hypotension - Consider resuming by mouth antihypertensives as clinically appropriate  5. Atherosclerotic cardiovascular disease - Patient is status post AAA repair and CABG - Aspirin and statin will be continued - Troponin ordered and pending - EKG not obtained in Colleen emergency department, ordered and pending  6. Chronic diastolic CHF - Last echo on file (09/14/2015), EF 42-39%, grade 2 diastolic dysfunction, PA peak pressure 56 - Patient is clinically dry on presentation - Holding her home Lasix for now and gently hydrating with IVF  7. Pancytopenia - Appears stable per chart review - Appears to have had a negative workup for multiple myeloma in June of this year  - Monitor  8. Chronic venous stasis with ulcers - Several ulcers on bilateral LEs - Distal right LE erythematous, but not hot, and without foul odor or purulence - Several open ulcers with proteinaceous exudate, in various stages of healing - Doesn't appear to be source of Colleen patient's possible infection, if there is cellulitis present, it is nonpurulent and should be covered by Colleen empiric Rocephin used for UTI - Consult wound care   DVT ppx:  SQ Lovenox   Code Status: DNR Family Communication:  Yes, patient's Foley at bed side, and pt's Foley by phone Disposition Plan: Admit to inpatient   Date of Service 11/13/2015     Vianne Bulls, MD [facility] Pager [phone]  If 7PM-7AM, please contact night-coverage [email or web address] Password [facility] 11/13/2015, 8:39 PM

## 2015-11-14 DIAGNOSIS — L8990 Pressure ulcer of unspecified site, unspecified stage: Secondary | ICD-10-CM

## 2015-11-14 DIAGNOSIS — I872 Venous insufficiency (chronic) (peripheral): Secondary | ICD-10-CM | POA: Diagnosis present

## 2015-11-14 DIAGNOSIS — Z6821 Body mass index (BMI) 21.0-21.9, adult: Secondary | ICD-10-CM | POA: Diagnosis not present

## 2015-11-14 DIAGNOSIS — Z8679 Personal history of other diseases of the circulatory system: Secondary | ICD-10-CM | POA: Diagnosis not present

## 2015-11-14 DIAGNOSIS — I25708 Atherosclerosis of coronary artery bypass graft(s), unspecified, with other forms of angina pectoris: Secondary | ICD-10-CM | POA: Diagnosis not present

## 2015-11-14 DIAGNOSIS — G8929 Other chronic pain: Secondary | ICD-10-CM | POA: Diagnosis present

## 2015-11-14 DIAGNOSIS — Z66 Do not resuscitate: Secondary | ICD-10-CM | POA: Diagnosis present

## 2015-11-14 DIAGNOSIS — D61818 Other pancytopenia: Secondary | ICD-10-CM | POA: Diagnosis present

## 2015-11-14 DIAGNOSIS — R410 Disorientation, unspecified: Secondary | ICD-10-CM | POA: Diagnosis not present

## 2015-11-14 DIAGNOSIS — I712 Thoracic aortic aneurysm, without rupture: Secondary | ICD-10-CM | POA: Diagnosis present

## 2015-11-14 DIAGNOSIS — N390 Urinary tract infection, site not specified: Secondary | ICD-10-CM | POA: Diagnosis present

## 2015-11-14 DIAGNOSIS — E876 Hypokalemia: Secondary | ICD-10-CM | POA: Diagnosis present

## 2015-11-14 DIAGNOSIS — B964 Proteus (mirabilis) (morganii) as the cause of diseases classified elsewhere: Secondary | ICD-10-CM | POA: Diagnosis present

## 2015-11-14 DIAGNOSIS — F05 Delirium due to known physiological condition: Secondary | ICD-10-CM | POA: Diagnosis not present

## 2015-11-14 DIAGNOSIS — G9340 Encephalopathy, unspecified: Secondary | ICD-10-CM | POA: Diagnosis present

## 2015-11-14 DIAGNOSIS — I878 Other specified disorders of veins: Secondary | ICD-10-CM | POA: Diagnosis present

## 2015-11-14 DIAGNOSIS — E785 Hyperlipidemia, unspecified: Secondary | ICD-10-CM | POA: Diagnosis present

## 2015-11-14 DIAGNOSIS — I868 Varicose veins of other specified sites: Secondary | ICD-10-CM | POA: Diagnosis present

## 2015-11-14 DIAGNOSIS — I10 Essential (primary) hypertension: Secondary | ICD-10-CM | POA: Diagnosis present

## 2015-11-14 DIAGNOSIS — I2510 Atherosclerotic heart disease of native coronary artery without angina pectoris: Secondary | ICD-10-CM | POA: Diagnosis present

## 2015-11-14 DIAGNOSIS — E440 Moderate protein-calorie malnutrition: Secondary | ICD-10-CM

## 2015-11-14 DIAGNOSIS — Z823 Family history of stroke: Secondary | ICD-10-CM | POA: Diagnosis not present

## 2015-11-14 DIAGNOSIS — Z8249 Family history of ischemic heart disease and other diseases of the circulatory system: Secondary | ICD-10-CM | POA: Diagnosis not present

## 2015-11-14 DIAGNOSIS — E860 Dehydration: Secondary | ICD-10-CM | POA: Diagnosis present

## 2015-11-14 DIAGNOSIS — A419 Sepsis, unspecified organism: Principal | ICD-10-CM

## 2015-11-14 DIAGNOSIS — G9341 Metabolic encephalopathy: Secondary | ICD-10-CM | POA: Diagnosis present

## 2015-11-14 DIAGNOSIS — I83009 Varicose veins of unspecified lower extremity with ulcer of unspecified site: Secondary | ICD-10-CM | POA: Diagnosis not present

## 2015-11-14 DIAGNOSIS — Z9181 History of falling: Secondary | ICD-10-CM | POA: Diagnosis not present

## 2015-11-14 DIAGNOSIS — Z951 Presence of aortocoronary bypass graft: Secondary | ICD-10-CM | POA: Diagnosis not present

## 2015-11-14 DIAGNOSIS — I5032 Chronic diastolic (congestive) heart failure: Secondary | ICD-10-CM | POA: Diagnosis not present

## 2015-11-14 DIAGNOSIS — R740 Nonspecific elevation of levels of transaminase and lactic acid dehydrogenase [LDH]: Secondary | ICD-10-CM

## 2015-11-14 DIAGNOSIS — M069 Rheumatoid arthritis, unspecified: Secondary | ICD-10-CM | POA: Diagnosis present

## 2015-11-14 LAB — TSH: TSH: 4.374 u[IU]/mL (ref 0.350–4.500)

## 2015-11-14 LAB — RPR: RPR: NONREACTIVE

## 2015-11-14 LAB — BASIC METABOLIC PANEL
ANION GAP: 11 (ref 5–15)
BUN: 17 mg/dL (ref 6–20)
CHLORIDE: 103 mmol/L (ref 101–111)
CO2: 30 mmol/L (ref 22–32)
Calcium: 9.1 mg/dL (ref 8.9–10.3)
Creatinine, Ser: 0.5 mg/dL (ref 0.44–1.00)
GFR calc non Af Amer: >60 mL/min (ref >60)
Glucose, Bld: 61 mg/dL — ABNORMAL LOW (ref 65–99)
POTASSIUM: 4.1 mmol/L (ref 3.5–5.1)
Sodium: 144 mmol/L (ref 135–145)

## 2015-11-14 LAB — SEDIMENTATION RATE: SED RATE: 14 mm/h (ref 0–22)

## 2015-11-14 LAB — TROPONIN I
Troponin I: <0.03 ng/mL (ref <0.031)
Troponin I: <0.03 ng/mL (ref <0.031)

## 2015-11-14 LAB — VITAMIN B12: VITAMIN B 12: 1282 pg/mL — AB (ref 180–914)

## 2015-11-14 LAB — HIV ANTIBODY (ROUTINE TESTING W REFLEX): HIV Screen 4th Generation wRfx: NONREACTIVE

## 2015-11-14 LAB — METHOTREXATE: METHOTREXATE: 0.07

## 2015-11-14 MED ORDER — COLLAGENASE 250 UNIT/GM EX OINT
TOPICAL_OINTMENT | Freq: Two times a day (BID) | CUTANEOUS | Status: DC
  Administered 2015-11-14 – 2015-11-18 (×9): via TOPICAL
  Administered 2015-11-18: 1 via TOPICAL
  Administered 2015-11-19: 10:00:00 via TOPICAL
  Filled 2015-11-14: qty 30

## 2015-11-14 MED ORDER — ENSURE ENLIVE PO LIQD
237.0000 mL | Freq: Every morning | ORAL | Status: DC
  Administered 2015-11-14 – 2015-11-19 (×4): 237 mL via ORAL

## 2015-11-14 MED ORDER — HYDROCERIN EX CREA
TOPICAL_CREAM | Freq: Two times a day (BID) | CUTANEOUS | Status: DC
  Administered 2015-11-14 – 2015-11-17 (×8): via TOPICAL
  Administered 2015-11-18: 1 via TOPICAL
  Administered 2015-11-18 – 2015-11-19 (×2): via TOPICAL
  Filled 2015-11-14: qty 113

## 2015-11-14 MED ORDER — PRO-STAT SUGAR FREE PO LIQD
30.0000 mL | Freq: Two times a day (BID) | ORAL | Status: DC
  Administered 2015-11-14 – 2015-11-19 (×7): 30 mL via ORAL
  Filled 2015-11-14 (×11): qty 30

## 2015-11-14 NOTE — Progress Notes (Signed)
Rec'd callback from son who states patient lives alone- independent with ADL's- does not drive anymore but her family is local, supportive and very involved. Per son, they are planning to dc back home with [facility] (PT,etc if needed) and may add some private duty care. The family checks in on her daily as well.  CSW will update RNCM and follow-   Reece Levy, MSW, [place]  [phone]

## 2015-11-14 NOTE — Progress Notes (Signed)
Initial Nutrition Assessment  DOCUMENTATION CODES:   Non-severe (moderate) malnutrition in context of acute illness/injury  INTERVENTION:  - Will order Ensure Enlive once/day, thissupplement provides 350 kcal and 20 grams of protein - Will order 30 mL Prostat BID, each supplement provides 100 kcal and 15 grams of protein. - Provide meal assistance as needed - RD will continue to monitor for needs  NUTRITION DIAGNOSIS:   Inadequate oral intake related to lethargy/confusion as evidenced by meal completion < 25%.  GOAL:   Patient will meet greater than or equal to 90% of their needs  MONITOR:   PO intake, Supplement acceptance, Weight trends, Labs, Skin, I & O's  REASON FOR ASSESSMENT:   Malnutrition Screening Tool  ASSESSMENT:   81 y.o. female with PMH of ASCVD status post AAA repair and CABG, rheumatoid arthritis, and chronic low back pain who is brought to the ED by family with concerns regarding acute confusion and agitation. Given the patient's acute confusional state, she is unable to provide a history at this time and so information obtained from the patient's daughter, son, and ED physician. Patient lives alone and typically cares for herself, but has family members checking in on her daily. 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 On 11/12/2015, at about 6 PM, the patient was visited by a niece who noted the patient to be confused and called the patient's son to inform him of this. Ms. Clowdus was taken over to her son's house where she spent the night, reportedly fidgeting and repeating, "I have to turn it off" throughout the night without sleep. She has a wobbly gait at baseline and falls frequently, but her family does not believe there was an acute trauma.   Pt seen for MST. BMI indicates normal weight. Pt has been confused since admission and remains so  this AM. No family in the room to provide information from PTA. Breakfast tray sitting in front of pt untouched; RD questions if pt is able to feed herself with current presentation and pt may need assistance during meal times. No other intakes documented since admission. Will order supplements as outlined above to assist.   Physical assessment showed mild muscle and fat wasting to upper body. Did not assess legs given multiple wounds and redness bilaterally. Per chart review, pt has lost 8 lbs (6% body weight) in the past 16 days which is significant for time frame.   Not meeting needs at this time. Medications reviewed; Hydrochlorothiazide ordered. Labs reviewed.   Diet Order:  DIET SOFT Room service appropriate?: Yes; Fluid consistency:: Thin  Skin:  Wound (see comment) (Stage 1 pressure ulcer to coccyx, Multiple wounds to lower legs)  Last BM:  12/20  Height:   Ht Readings from Last 1 Encounters:  11/13/15 5\' 2"  (1.575 m)    Weight:   Wt Readings from Last 1 Encounters:  11/14/15 126 lb 8.7 oz (57.4 kg)    Ideal Body Weight:  50 kg (kg)  BMI:  Body mass index is 23.14 kg/(m^2).  Estimated Nutritional Needs:   Kcal:  1400-1600  Protein:  50-60 grams  Fluid:  1.8-2.1 L/day  EDUCATION NEEDS:   No education needs identified at this time     11/16/15, RD, LDN Inpatient Clinical Dietitian Pager # [phone] After hours/weekend pager # [phone]

## 2015-11-14 NOTE — Progress Notes (Signed)
[facility] called and said that [name] methotrexate value is 0.07 and they were informed to call us once they had the result.   Rockne Coons, RN

## 2015-11-14 NOTE — Progress Notes (Signed)
[name] came up to the floor from the Emergency Department around 2200 on 11/13/15. When doing the admission/initial vital signs the RN and tech were having difficulty obtaining a temperature. After multiple failed attempts orally and axillary we did a rectal temperature and it just kept saying "Lo." So the RN looked in the patient's chart there was no record of any temperature for this patient the entire time she's been here. RN paged NP on call to ask what should be done and was told to put multiple warm blankets on her and re-check rectal temperature within an hour. After waiting an hour RN checked rectal temperature and finally got one at 90.5. RN paged NP on call and made her aware of the situation and since this patient was a DNR and otherwise stable the NP got the [facility] of the hospital to come evaluate the patient to see if she needed to move to step-down. At this point the [facility] and NP were in agreement that the patient should stay on the floor and I cover her with more warm blankets and keep checking her temperature throughout the night. Last temperature taken was 91.8 at 0115. So patient's temperature is increasing and moving in the right direction. RN is monitoring patient and keeping an eye on temperature while keeping the NP on call aware of the situation.   Rockne Coons, RN 2:48 AM 11/14/2015

## 2015-11-14 NOTE — Consult Note (Signed)
WOC wound consult note Reason for Consult: Bilateral lower extremity wounds. Bilateral heels are intact Wound type:Mixed etiology, chronic venous and arterial insufficiency. Hairless legs with thin brittle nails are consistent with PAD, hemosiderin staining is consistent with chronic venous insufficiency. Punctate ulcerations with smooth edges on areas other than the medial malleolus is consistent with PAD. Pressure Ulcer POA: No Measurements:  Left proximal wound:  1.5cm x 2cm with depth not known due to the presence of firmly adherent yellow slough. No exudate Left distal wound:  1.6cm x 1.4cm x 0.4cm with 25% of wound bed obscured by yellow slough, 75% of wound bed is pink, dry. No exudate. Right medial proximal wound:  3cm x 2cm with depth not known due to the presence of necrotic tissue.  100% firmly adherent yellow slough. Right distal wound:  3cm x 1.5cm  With 50% of wound covered in yellow slough and 50% of wound pink and dry. No exudate. Right lateral wound:  3.5cm x 3cm with depth unable to be determined due to the presence of necrotic tissue covering 100% of the wound bed. No exudate. Wound bed:As described above Drainage (amount, consistency, odor) As described above Periwound: Bilateral LEs are as described above, exhibiting presentations consistent with ulcers of mixed etiology. Dressing procedure/placement/frequency: I will implement wound care to the ulcers that will support enzymatic debridement of the necrotic wound beds (collagenase/Santyl) in addition to addressing the chronic dryness with Eucerin twice daily.   Typically collagenase is applied once daily, but we will begin with twice daily applications. The heels are intact, but given the circulatory compromise, I will implement bilateral pressure redistribution boots. Thank you for this consultation.  The WOC nursing team will not follow, but will remain available to this patient, the nursing and medical teams.  Please re-consult  if needed. Thanks, Ladona Mow, MSN, RN, GNP, Hans Eden  Pager# [phone]

## 2015-11-14 NOTE — Progress Notes (Signed)
CSW attempted to meet with patient this morning but she was too drowsy and unable to engage in conversation. CSW has left message for son as well. Possible need for placement noted- will need PT/OT evals and recommendations for disposition determination.  Reece Levy, MSW, Theresia Majors  [phone]

## 2015-11-14 NOTE — Progress Notes (Signed)
PT Cancellation Note  Patient Details Name: [name] MRN: [id number] DOB: 03-11-1932   Cancelled Treatment:    Reason Eval/Treat Not Completed: Patient's level of consciousness   Rada Hay 11/14/2015, 3:42 PM Blanchard Kelch PT [phone]

## 2015-11-14 NOTE — Care Management Note (Signed)
Case Management Note  Patient Details  Name: [name] MRN: [id number] Date of Birth: [birth year]  Subjective/Objective: 81 y/o f admitted w/Acute confusional state. From home.PT cons-await recc.                   Action/Plan:d/c plan home.   Expected Discharge Date:   (unknown)               Expected Discharge Plan:  Home/Self Care  In-House Referral:     Discharge planning Services  CM Consult  Post Acute Care Choice:    Choice offered to:     DME Arranged:    DME Agency:     HH Arranged:    HH Agency:     Status of Service:  In process, will continue to follow  Medicare Important Message Given:    Date Medicare IM Given:    Medicare IM give by:    Date Additional Medicare IM Given:    Additional Medicare Important Message give by:     If discussed at Long Length of Stay Meetings, dates discussed:    Additional Comments:  Lanier Clam, RN 11/14/2015, 12:40 PM

## 2015-11-14 NOTE — Evaluation (Signed)
Clinical/Bedside Swallow Evaluation Patient Details  Name: [name] MRN: [id number] Date of Birth: 10/01/1932  Today's Date: 11/14/2015 Time: SLP Start Time (ACUTE ONLY): 1005 SLP Stop Time (ACUTE ONLY): 1019 SLP Time Calculation (min) (ACUTE ONLY): 14 min  Past Medical History:  Past Medical History  Diagnosis Date  . DEPRESSION 05/21/2007  . FIBROCYSTIC BREAST DISEASE, HX OF 05/21/2007  . HYPERLIPIDEMIA-MIXED 11/26/2008    takes Simvastatin daily  . Irritable bowel syndrome 05/21/2007  . KNEE PAIN 09/16/2007  . LEG EDEMA, BILATERAL 05/21/2009  . OSTEOPENIA 05/21/2007  . URTICARIA, CHRONIC 05/21/2007  . History of aortic aneurysm     resection and grafting of a 5.2-cm ascending aortic arch aneurysm August 2009  . Dyslipidemia   . Vitamin D deficiency   . Urticaria   . Neuropathy (HCC)     left radial  neuropathy with wrist drop  . Diverticulosis   . Irritable bowel syndrome   . GERD (gastroesophageal reflux disease)   . 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 Vaginal yeast infection     recently told to use monistat, hasn't started as of 01/26/2012  . HYPERTENSION 05/11/2008    takes Metoprolol and HCTZ daily  . Asthma     hx of;as a child  . Seasonal allergies     takes Hydroxyzine prn and Zyrtec 2 times a week  . Peripheral edema     takes Furosemide prn  . Pneumonia     history of;in the 90's  . Weakness     hands and feet  . Sciatica   . OSTEOARTHRITIS 01/20/2008    takes Methotrexate weekly  . DJD (degenerative joint disease)     history of   . Rheumatoid arthritis(714.0)   . Chronic back pain     takes Norco prn  . Bruises easily     takes Plaquenil daily  . CORONARY ATHEROSCLEROSIS, ARTERY BYPASS GRAFT 08/23/2008  . CAD (coronary artery disease)     s/p CABG -  x 1 with LIMA to LAD August 2009 /   . Urinary incontinence   . Bowel incontinence   . History of colon polyps   . Urinary frequency   . Urinary urgency   . Nocturia   . Anemia      takes folic acid bid  . Urinary incontinence     wears depends  . Hereditary and idiopathic peripheral neuropathy 05/17/2015   Past Surgical History:  Past Surgical History  Procedure Laterality Date  . Appendectomy    . Cesarean section  1968    x 1  . Hernia repair    . Coronary artery bypass graft  August 2009    CABG x 1 with LIMA to LAD /  . Ascending aortic aneurysm repair  August 2009     resection and grafting of a 5.2-cm ascending aortic arch aneurysm  . Joint replacement      2009 & 2010- both knees  . Back surgery      2011- cerv. fusion   . Breast surgery      biopsy x2- R breast  . Tonsillectomy      as a child  . Cardiac catheterization  2009  . Colonoscopy    . Lumbar laminectomy/decompression microdiscectomy Bilateral 07/11/2013    Procedure: LUMBAR LAMINECTOMY/DECOMPRESSION MICRODISCECTOMY LUMBAR FIVE SACRAL ONE;  Surgeon: Mariam Dollar, MD;  Location: [facility];  Service: Neurosurgery;  Laterality: Bilateral;   HPI:  81 y.o. female with PMH of ASCVD status post AAA repair and CABG, GERD, rheumatoid arthritis, and chronic low back pain who is brought to the ED by family with concerns regarding acute confusion and agitation.  CT of the head and neck negative for acute processes. Urinalysis is suggestive of UTI. CT chest no pneumothorax. No edema or consolidation.   Assessment / Plan / Recommendation Clinical Impression  Pt exhibits oropharyngeal dysphagia impacted primarily by decreased alertness and awareness/cognitive status. Suspect airway compromise with thin due to aformentioned status. Suspect improvements in swallow function once more awake and aware. Downgraded texture to Dys 2 and nectar thick liquids, crush meds. Will follow up next date for status.     Aspiration Risk   (mod-severe)    Diet Recommendation Dysphagia 2 (Fine chop);Nectar-thick liquid   Liquid Administration via: Cup;No straw Medication Administration: Crushed with puree Supervision:  Staff to assist with self feeding;Full supervision/cueing for compensatory strategies Compensations: Slow rate;Small sips/bites Postural Changes: Seated upright at 90 degrees    Other  Recommendations Oral Care Recommendations: Oral care BID   Follow up Recommendations  None    Frequency and Duration min 2x/week  2 weeks       Prognosis Prognosis for Safe Diet Advancement: Good Barriers to Reach Goals: Cognitive deficits      Swallow Study   General HPI: 81 y.o. female with PMH of ASCVD status post AAA repair and CABG, GERD, rheumatoid arthritis, and chronic low back pain who is brought to the ED by family with concerns regarding acute confusion and agitation.  CT of the head and neck negative for acute processes. Urinalysis is suggestive of UTI. CT chest no pneumothorax. No edema or consolidation. Type of Study: Bedside Swallow Evaluation Previous Swallow Assessment: none found Diet Prior to this Study: Thin liquids (soft) Temperature Spikes Noted: No Respiratory Status: Room air History of Recent Intubation: No Behavior/Cognition: Lethargic/Drowsy;Requires cueing Oral Cavity Assessment:  (thick secretions/mucousy) Oral Care Completed by SLP: No Oral Cavity - Dentition: Adequate natural dentition Vision:  (questionable) Self-Feeding Abilities: Total assist Patient Positioning: Upright in bed Baseline Vocal Quality: Low vocal intensity Volitional Cough: Cognitively unable to elicit Volitional Swallow: Unable to elicit    Oral/Motor/Sensory Function Overall Oral Motor/Sensory Function: Generalized oral weakness   Ice Chips Ice chips: Not tested   Thin Liquid Thin Liquid: Impaired Presentation: Cup;Straw Pharyngeal  Phase Impairments: Wet Vocal Quality;Suspected delayed Swallow    Nectar Thick Nectar Thick Liquid: Not tested   Honey Thick Honey Thick Liquid: Not tested   Puree Puree: Impaired Presentation: Spoon Pharyngeal Phase Impairments: Suspected delayed Swallow    Solid Solid: Impaired Oral Phase Impairments: Impaired mastication Oral Phase Functional Implications: Impaired mastication       Roque Cash, [name] 11/14/2015,10:47 AM  [name] [name].Ed [employer] [phone]

## 2015-11-14 NOTE — Progress Notes (Addendum)
Patient ID: [name], female   DOB: 05/26/1932, 81 y.o.   MRN: [id number]  [facility] PROGRESS NOTE  [name] PFY:[id number] DOB: 06-16-1932 DOA: 11/13/2015 PCP: [name], MD   Brief narrative:    81 y.o. female with ASCVD status post AAA repair and CABG, rheumatoid arthritis, and chronic low back pain who is brought to the ED by family with concerns regarding acute confusion and agitation.   In ED, patient had stable VS except hypothermia and T 90.5 F, glucose in 50's and noncontrast CT of the head and neck were obtained, and there was a small right frontal scalp hematoma, but the studies were otherwise negative for acute processes. Metabolic panel revealed mild elevation in the transaminases and a mild hypoalbuminemia, but the electrolytes and renal function were within the normal limits. CBC demonstrated a stable normocytic anemia and leukopenia and lactic acid 1.0. Urinalysis revealed many bacteria and positive leukocytes, as well as triple phosphate crystals, and were sent for culture. Patient was given IV fluids and an empiric dose of Rocephin for suspected urinary tract infection and the hospitalists were asked to admit.  Assessment/Plan:    Principal Problem:   Acute encephalopathy - suspect this is metabolic and related to UTI sepsis, dehydration  - still confused this AM - continue treatment with IV Rocephin for UTI - continue IVF - requested PT/OT evaluation   Active Problems:   Sepsis secondary to UTI ([place]) - unclear pathogen at this this time - pt met criteria for sepsis with T 90.5 F, HT < 90, WBC <4, suspected source UTI    Chronic diastolic CHF - Last echo on file (09/14/2015), EF 81-77%, grade 2 diastolic dysfunction, PA peak pressure 56 - Patient is clinically dry, continue gentle hydration  - Holding her home Lasix for now     Chronic venous stasis with ulcers - Several ulcers on bilateral LEs - Distal right LE erythematous, but  not hot, and without foul odor or purulence - Several open ulcers with proteinaceous exudate, in various stages of healing - Doesn't appear to be source of the patient's possible infection, if there is cellulitis present, it is nonpurulent and should be covered by the empiric Rocephin used for UTI - wound care consulted     Pancytopenia ([place]) - review of records indicate that WBC and Plt count were WNL 2 months ago - ? Plaquenil vs Methotrexate effect in the setting of acute illness UTI sepsis  - monitor closely  - CBC with diff in AM    CORONARY ATHEROSCLEROSIS, ARTERY BYPASS GRAFT - continue with Aspirin and statin     Malnutrition of moderate degree - appreciate nutritionist assistance     Transaminitis - AST > ALT - repeat CMET in AM    Essential hypertension - reasonable inpatient control - continue HCTZ and Metoprolol     Rheumatoid arthritis (HCC) - Plaquenil and Methotrexate on hold for now     Pressure ulcer - wound care consulted   DVT prophylaxis - Lovenox SQ  Code Status: DNR Family Communication:  plan of care discussed with the patient, no family at bedside  Disposition Plan: Not ready for d/c , suspect later this week   IV access:  Peripheral IV  Procedures and diagnostic studies:    Dg Chest 2 View 11/13/2015  Healing fractures of the right posterior sixth and seventh ribs. No pneumothorax. No edema or consolidation. Stable cardiac prominence and aortic tortuosity.   Ct Head Wo Contrast 11/13/2015  CT head: Age related volume loss with mild periventricular small vessel disease. No intracranial mass, hemorrhage, or extra-axial fluid collection. No evidence of acute infarct. Small right frontal scalp hematoma. CT cervical spine: Postoperative change from C4-C6. No fracture. Slight spondylolisthesis at C3-4, felt to be due to underlying spondylosis. No other spondylolisthesis. Multilevel arthropathy.   Medical Consultants:  None   Other Consultants:   PT/OT Nutritionist   IAnti-Infectives:   Rocephin 12/20 -->  Colleen Ramsay, MD  [facility] Pager [phone]  If 7PM-7AM, please contact night-coverage [email or web address] Password [facility] 11/14/2015, 10:01 AM  HPI/Subjective: No events overnight.   Objective: Filed Vitals:   11/14/15 0115 11/14/15 0330 11/14/15 0458 11/14/15 0601  BP:   127/66   Pulse:   79   Temp: 91.8 F (33.2 C) 93.6 F (34.2 C)  96 F (35.6 C)  TempSrc: Rectal Rectal  Rectal  Resp:   16   Height:      Weight:    57.4 kg (126 lb 8.7 oz)  SpO2:   95%     Intake/Output Summary (Last 24 hours) at 11/14/15 1001 Last data filed at 11/14/15 0643  Gross per 24 hour  Intake    364 ml  Output      0 ml  Net    364 ml    Exam:   General:  Pt is somnolent but easy to awake, NAD, confused   Cardiovascular: Regular rate and rhythm, no rubs, no gallops  Respiratory: Clear to auscultation bilaterally, no wheezing, diminished breath sounds at bases   Abdomen: Soft, non tender, non distended, bowel sounds present, no guarding  Data Reviewed: Basic Metabolic Panel:  Recent Labs Lab 11/13/15 1737 11/13/15 2043 11/14/15 0240  NA 144  --  144  K 3.8  --  4.1  CL 101  --  103  CO2 31  --  30  GLUCOSE 99  --  61*  BUN 19  --  17  CREATININE 0.54  --  0.50  CALCIUM 9.3  --  9.1  MG  --  1.7  --    Liver Function Tests:  Recent Labs Lab 11/13/15 1737  AST 68*  ALT 55*  ALKPHOS 100  BILITOT 1.1  PROT 6.4*  ALBUMIN 3.2*    Recent Labs Lab 11/13/15 2225  AMMONIA 17   CBC:  Recent Labs Lab 11/13/15 1737  WBC 3.5*  NEUTROABS 2.7  HGB 9.8*  HCT 30.4*  MCV 85.2  PLT 111*   Cardiac Enzymes:  Recent Labs Lab 11/13/15 2043 11/14/15 0240 11/14/15 0825  TROPONINI <0.03 <0.03 <0.03    Scheduled Meds: . aspirin EC  81 mg Oral QHS  . cefTRIAXone   IV  1 g Intravenous Q24H  . cholecalciferol  1,000 Units Oral Daily  . enoxaparin  injection  40 mg Subcutaneous Q24H  . folic acid  1  mg Oral BID  . hydrocerin   Topical BID  . hydrochlorothiazide  25 mg Oral q morning - 10a  . loratadine  10 mg Oral Daily  . metoprolol  2.5 mg Intravenous 4 times per day  . simvastatin  40 mg Oral QHS   Continuous Infusions: . 0.9 % NaCl with KCl 20 mEq / L 10 mL/hr at 11/13/15 2319

## 2015-11-14 NOTE — Evaluation (Signed)
Occupational Therapy Evaluation Patient Details Name: [name] MRN: [id number] DOB: 12-27-1931 Today's Date: 11/14/2015    History of Present Illness Pt was admitted with confusion and agitation. She has PMH significant for ASCVD, RA, CABG, chronic low back pain   Clinical Impression   This 81 year old female was admitted for the above.  Per chart, she lives alone and family checks on her daily.  She needs up to total A for ADLs at this time. She was lethargic but followed commands and answered mostly yes/no questions during evaluation.  Pt will benefit from continued OT to increase safety and independence with adls.  Goals in acute are for supervision to min A levels.      Follow Up Recommendations  SNF;Supervision/Assistance - 24 hour -- per chart, family plans to hire assistance for home   Equipment Recommendations   (to be further assessed)    Recommendations for Other Services       Precautions / Restrictions Precautions Precautions: Fall Restrictions Weight Bearing Restrictions: No      Mobility Bed Mobility Overal bed mobility: Needs Assistance Bed Mobility: Rolling Rolling: Total assist (to R)         General bed mobility comments: utilized pad; pt did not assist  Transfers                 General transfer comment: not attempted    Balance                                            ADL Overall ADL's : Needs assistance/impaired Eating/Feeding: Total assistance;Bed level (90 degrees)   Grooming: Wash/dry face;Total assistance                                 General ADL Comments: pt stated she was thirsty; could not hold cup independently and did not lift to mouth: assistance given and also with amount taken in.  Pt following commands but little effort/decreased initiation.  Was able to open eyes with cues.  Total A for ADLs, +2 for LB at this time     Vision     Perception     Praxis      Pertinent  Vitals/Pain Pain Assessment: Faces Faces Pain Scale: Hurts a little bit Pain Location: stomach Pain Intervention(s): Limited activity within patient's tolerance;Monitored during session;Patient requesting pain meds-RN notified     Hand Dominance Right   Extremity/Trunk Assessment Upper Extremity Assessment Upper Extremity Assessment: Difficult to assess due to impaired cognition (little effort; AAROM WFLs)       Cervical / Trunk Assessment Cervical / Trunk Assessment:  (h/o neck sx and chronic low back pain)   Communication Communication Communication:  (mostly answered yes/no).     Cognition Arousal/Alertness: Lethargic Behavior During Therapy: Flat affect Overall Cognitive Status: Impaired/Different from baseline Area of Impairment: Following commands;Safety/judgement;Awareness;Attention   Current Attention Level: Focused   Following Commands: Follows one step commands inconsistently       General Comments: pt answering most questions appropriately:  eyes closed a lot of session.  Pt did ask "Are they out"  When asked to clarify, she said "lights"; her eyes were closed. Decreased initiation.  Pt was aware that she is at [facility] Comments       Exercises  Shoulder Instructions      Home Living Family/patient expects to be discharged to:: Unsure Living Arrangements: Alone (family checked on pt daily)                               Additional Comments: was living alone with family checking in      Prior Functioning/Environment Level of Independence: Independent             OT Diagnosis: Generalized weakness;Altered mental status   OT Problem List: Decreased strength;Decreased activity tolerance;Decreased cognition;Decreased safety awareness;Pain (balance NT)   OT Treatment/Interventions: Self-care/ADL training;DME and/or AE instruction;Patient/family education;Therapeutic exercise;Therapeutic activities;Cognitive  remediation/compensation    OT Goals(Current goals can be found in the care plan section) Acute Rehab OT Goals Patient Stated Goal: none stated OT Goal Formulation: Patient unable to participate in goal setting Time For Goal Achievement: 11/28/15 Potential to Achieve Goals: Good ADL Goals Pt Will Perform Eating: with supervision;sitting Pt Will Perform Grooming: with supervision;sitting Pt Will Perform Upper Body Bathing: with supervision;sitting Pt Will Perform Lower Body Bathing: with min assist;sit to/from stand Pt Will Perform Upper Body Dressing: with supervision;sitting Pt Will Perform Lower Body Dressing: with min assist;sit to/from stand Pt Will Transfer to Toilet: with min assist;bedside commode;stand pivot transfer Pt Will Perform Toileting - Clothing Manipulation and hygiene: with min assist;sit to/from stand Additional ADL Goal #1: pt will initiate and follow ADL/mobility cues within 5 seconds 90% of time  OT Frequency: Min 2X/week   Barriers to D/C:            Co-evaluation              End of Session    Activity Tolerance: Patient limited by fatigue;Patient limited by lethargy Patient left: in bed;with call bell/phone within reach;with bed alarm set   Time: 1317-1330 OT Time Calculation (min): 13 min Charges:  OT General Charges $OT Visit: 1 Procedure OT Evaluation $Initial OT Evaluation Tier I: 1 Procedure G-Codes: OT G-codes **NOT FOR INPATIENT CLASS** Functional Assessment Tool Used: clinical observation and judgment Functional Limitation: Self care Self Care Current Status (K2409): At least 80 percent but less than 100 percent impaired, limited or restricted Self Care Goal Status (B3532): At least 20 percent but less than 40 percent impaired, limited or restricted  [name] 11/14/2015, 2:02 PM Marica Otter, OTR/L [phone] 11/14/2015

## 2015-11-15 DIAGNOSIS — I83009 Varicose veins of unspecified lower extremity with ulcer of unspecified site: Secondary | ICD-10-CM

## 2015-11-15 DIAGNOSIS — I5032 Chronic diastolic (congestive) heart failure: Secondary | ICD-10-CM

## 2015-11-15 LAB — CBC WITH DIFFERENTIAL/PLATELET
Basophils Absolute: 0 10*3/uL (ref 0.0–0.1)
Basophils Relative: 0 %
EOS ABS: 0 10*3/uL (ref 0.0–0.7)
Eosinophils Relative: 1 %
HEMATOCRIT: 29.9 % — AB (ref 36.0–46.0)
HEMOGLOBIN: 9.7 g/dL — AB (ref 12.0–15.0)
LYMPHS ABS: 1 10*3/uL (ref 0.7–4.0)
Lymphocytes Relative: 15 %
MCH: 28 pg (ref 26.0–34.0)
MCHC: 32.4 g/dL (ref 30.0–36.0)
MCV: 86.2 fL (ref 78.0–100.0)
MONO ABS: 0.8 10*3/uL (ref 0.1–1.0)
MONOS PCT: 13 %
NEUTROS PCT: 71 %
Neutro Abs: 4.4 10*3/uL (ref 1.7–7.7)
Platelets: 125 10*3/uL — ABNORMAL LOW (ref 150–400)
RBC: 3.47 MIL/uL — ABNORMAL LOW (ref 3.87–5.11)
RDW: 17.6 % — ABNORMAL HIGH (ref 11.5–15.5)
WBC: 6.3 10*3/uL (ref 4.0–10.5)

## 2015-11-15 LAB — COMPREHENSIVE METABOLIC PANEL
ALK PHOS: 85 U/L (ref 38–126)
ALT: 40 U/L (ref 14–54)
ANION GAP: 12 (ref 5–15)
AST: 43 U/L — ABNORMAL HIGH (ref 15–41)
Albumin: 2.7 g/dL — ABNORMAL LOW (ref 3.5–5.0)
BILIRUBIN TOTAL: 0.9 mg/dL (ref 0.3–1.2)
BUN: 14 mg/dL (ref 6–20)
CALCIUM: 8.3 mg/dL — AB (ref 8.9–10.3)
CO2: 29 mmol/L (ref 22–32)
Chloride: 100 mmol/L — ABNORMAL LOW (ref 101–111)
Creatinine, Ser: 0.7 mg/dL (ref 0.44–1.00)
GFR calc non Af Amer: >60 mL/min (ref >60)
Glucose, Bld: 77 mg/dL (ref 65–99)
Potassium: 3.5 mmol/L (ref 3.5–5.1)
Sodium: 141 mmol/L (ref 135–145)
TOTAL PROTEIN: 5.5 g/dL — AB (ref 6.5–8.1)

## 2015-11-15 LAB — FOLATE RBC
Folate, Hemolysate: >620 ng/mL
Folate, RBC: >2123 ng/mL (ref >498)
HEMATOCRIT: 29.2 % — AB (ref 34.0–46.6)

## 2015-11-15 MED ORDER — RESOURCE THICKENUP CLEAR PO POWD
ORAL | Status: DC | PRN
  Filled 2015-11-15: qty 125

## 2015-11-15 NOTE — Clinical Social Work Placement (Signed)
   CLINICAL SOCIAL WORK PLACEMENT  NOTE  Date:  11/15/2015  Patient Details  Name: [name] MRN: [id number] Date of Birth: [birth year]  Clinical Social Work is seeking post-discharge placement for this patient at the Skilled  Nursing Facility level of care (*CSW will initial, date and re-position this form in  chart as items are completed):  No   Patient/family provided with [facility] Clinical Social Work Department's list of facilities offering this level of care within the geographic area requested by the patient (or if unable, by the patient's family).  Yes   Patient/family informed of their freedom to choose among providers that offer the needed level of care, that participate in Medicare, Medicaid or managed care program needed by the patient, have an available bed and are willing to accept the patient.  No   Patient/family informed of 's ownership interest in [facility] and [facility], as well as of the fact that they are under no obligation to receive care at these facilities.  PASRR submitted to EDS on       PASRR number received on       Existing PASRR number confirmed on 11/15/15     FL2 transmitted to all facilities in geographic area requested by pt/family on 11/15/15     FL2 transmitted to all facilities within larger geographic area on       Patient informed that his/her managed care company has contracts with or will negotiate with certain facilities, including the following:            Patient/family informed of bed offers received.  Patient chooses bed at       Physician recommends and patient chooses bed at      Patient to be transferred to   on  .  Patient to be transferred to facility by       Patient family notified on   of transfer.  Name of family member notified:        PHYSICIAN       Additional Comment:    _______________________________________________ [name]  [phone] 11/15/2015, 3:54  PM

## 2015-11-15 NOTE — Progress Notes (Signed)
Speech Language Pathology Treatment: Dysphagia  Patient Details Name: [name] MRN: [id number] DOB: 05-29-1932 Today's Date: 11/15/2015 Time: 0263-7858 SLP Time Calculation (min) (ACUTE ONLY): 20 min  Assessment / Plan / Recommendation Clinical Impression  Increased alertness and more aware of surroundings; not oriented to purpose for admission/dx. Continues to exhibit wet vocal quality and delayed coughing with trials of thin liquids indicative of airway compromise mitigated with nectar thick. Use of arms/hands for feeding was non functional; right hand appeared slightly weaker than yesterday (?), RN notified and will discuss with OT who evaluated yesterday. She is dependent on staff for feeding increasing her aspiration risk. Continue Dys 2 (due to decreased endurance) and nectar thick liquids, no straws and full supervision/assistance. She will need ST at discharge (either Home Health or at facility if that is the plan).    HPI HPI: 81 y.o. female with PMH of ASCVD status post AAA repair and CABG, GERD, rheumatoid arthritis, and chronic low back pain who is brought to the ED by family with concerns regarding acute confusion and agitation.  CT of the head and neck negative for acute processes. Urinalysis is suggestive of UTI. CT chest no pneumothorax. No edema or consolidation.      SLP Plan  Continue with current plan of care     Recommendations  Diet recommendations: Dysphagia 2 (fine chop);Nectar-thick liquid Liquids provided via: Cup;No straw Medication Administration: Crushed with puree Supervision: Staff to assist with self feeding;Full supervision/cueing for compensatory strategies Compensations: Slow rate;Small sips/bites Postural Changes and/or Swallow Maneuvers: Seated upright 90 degrees              Oral Care Recommendations: Oral care BID Follow up Recommendations:  (home health vs SNF) Plan: Continue with current plan of care   [name] 11/15/2015,  11:10 AM   [name] [name].Ed [employer] [phone]

## 2015-11-15 NOTE — Clinical Social Work Note (Signed)
Clinical Social Work Assessment  Patient Details  Name: [name] MRN: [id number] Date of Birth: 1932-01-18  Date of referral:  11/15/15               Reason for consult:  Facility Placement, Discharge Planning                Permission sought to share information with:  Oceanographer granted to share information::  Yes, Verbal Permission Granted  Name::        Agency::     Relationship::     Contact Information:     Housing/Transportation Living arrangements for the past 2 months:  Single Family Home Source of Information:  Adult Children Patient Interpreter Needed:  None Criminal Activity/Legal Involvement Pertinent to Current Situation/Hospitalization:  No - Comment as needed Significant Relationships:  Adult Children, Other Family Members Lives with:  Self Do you feel safe going back to the place where you live?   (ST Rehab recommended.) Need for family participation in patient care:  Yes (Comment)  Care giving concerns:  Pt may need more care at home than available.   Social Worker assessment / plan: Pt hospitalized on 11/13/15 with acute encephalopathy. CSW attempted to meet with pt but care was being provided. PN reviewed and pt's son contacted. PT / OT recommendations reviewed. Pt's son reports that he feels ST Rehab would be helpful prior to pt returning ome. Pt's son reports that he will discuss this plan with his mother and gave CSW permission to initiate SNF search and insurance authorization. CSW has requested SNF authorization from [facility]. A decision is pending. CSW will continue to follow to assist with d/c planning to SNF.  Employment status:  Retired Database administrator PT Recommendations:  Skilled Nursing Facility Information / Referral to community resources:  Skilled Nursing Facility  Patient/Family's Response to care: Son feels ST Rehab is needed.  Patient/Family's Understanding of and Emotional  Response to Diagnosis, Current Treatment, and Prognosis:  Pt's son has spoken with MD and is aware of pt's medical status. Pt's son reports that pt as been to [facility] in the past and had a good experience. Pt's son feels pt will accept ST  Rehab plan and son will contact CSW after plan is  reviewed with pt.  Emotional Assessment Appearance:  Appears stated age Attitude/Demeanor/Rapport:  Unable to Assess Affect (typically observed):  Unable to Assess Orientation:  Oriented to Self, Oriented to Place, Oriented to Situation Alcohol / Substance use:  Not Applicable Psych involvement (Current and /or in the community):  No (Comment)  Discharge Needs  Concerns to be addressed:  Discharge Planning Concerns Readmission within the last 30 days:  No Current discharge risk:  None Barriers to Discharge:      [name]  [phone] 11/15/2015, 3:34 PM

## 2015-11-15 NOTE — NC FL2 (Signed)
[place] MEDICAID FL2 LEVEL OF CARE SCREENING TOOL     IDENTIFICATION  Patient Name: [name] Birthdate: 24-Jun-1932 Sex: female Admission Date (Current Location): 11/13/2015  [facility] and [place] Number:  Producer, television/film/video and Address:  [facility],  [address] [place] [address]      Provider Number: [id number]  Attending Physician Name and Address:  Dorothea Ogle, MD  Relative Name and Phone Number:       Current Level of Care: Hospital Recommended Level of Care: Skilled Nursing Facility Prior Approval Number:    Date Approved/Denied:   PASRR Number: [id number]  Discharge Plan: SNF    Current Diagnoses: Patient Active Problem List   Diagnosis Date Noted  . Pressure ulcer 11/14/2015  . Malnutrition of moderate degree 11/14/2015  . Acute encephalopathy 11/14/2015  . Sepsis secondary to UTI (HCC) 11/14/2015  . Pancytopenia (HCC) 11/14/2015  . Transaminitis 11/14/2015  . Chronic diastolic congestive heart failure (HCC) 11/14/2015  . Chronic cutaneous venous stasis ulcer (HCC) 11/14/2015  . Acute confusional state 11/14/2015  . Rheumatoid arthritis (HCC) 11/13/2015  . Hereditary and idiopathic peripheral neuropathy 05/17/2015  . History of aortic aneurysm   . CORONARY ATHEROSCLEROSIS, ARTERY BYPASS GRAFT 08/23/2008  . Essential hypertension 05/11/2008  . FIBROCYSTIC BREAST DISEASE, HX OF 05/21/2007    Orientation RESPIRATION BLADDER Height & Weight    Self, Time, Place  Normal Continent 5\' 2"  (157.5 cm) 128 lbs.  BEHAVIORAL SYMPTOMS/MOOD NEUROLOGICAL BOWEL NUTRITION STATUS  Other (Comment) (no behaviors)   Continent Diet  AMBULATORY STATUS COMMUNICATION OF NEEDS Skin   Extensive Assist Verbally Other (Comment) (Stage 2 decub ulcer on coccyx, multiple wounds on lower legs)                       Personal Care Assistance Level of Assistance  Bathing, Feeding, Dressing Bathing Assistance: Maximum assistance Feeding  assistance: Independent Dressing Assistance: Maximum assistance     Functional Limitations Info             SPECIAL CARE FACTORS FREQUENCY  PT (By licensed PT), OT (By licensed OT)     PT Frequency: 5 x wk OT Frequency: 5 x wk            Contractures Contractures Info: Not present    Additional Factors Info  Code Status Code Status Info: Full Code             Current Medications (11/15/2015):  This is the current hospital active medication list Current Facility-Administered Medications  Medication Dose Route Frequency Provider Last Rate Last Dose  . acetaminophen (TYLENOL) tablet 500 mg  500 mg Oral Q6H PRN 11/17/2015, MD   500 mg at 11/15/15 0023  . aspirin EC tablet 81 mg  81 mg Oral QHS 11/17/15, MD   81 mg at 11/15/15 0023  . calcium carbonate (TUMS - dosed in mg elemental calcium) chewable tablet 200 mg of elemental calcium  1 tablet Oral Daily PRN 11/17/15, MD   200 mg of elemental calcium at 11/13/15 2156  . cefTRIAXone (ROCEPHIN) 1 g in dextrose 5 % 50 mL IVPB  1 g Intravenous Q24H 2157, MD   1 g at 11/14/15 2021  . cholecalciferol (VITAMIN D) tablet 1,000 Units  1,000 Units Oral Daily 2022, MD   1,000 Units at 11/15/15 1050  . collagenase (SANTYL) ointment   Topical BID 11/17/15, MD      .  enoxaparin (LOVENOX) injection 40 mg  40 mg Subcutaneous Q24H Briscoe Deutscher, MD   40 mg at 11/14/15 2255  . feeding supplement (ENSURE ENLIVE) (ENSURE ENLIVE) liquid 237 mL  237 mL Oral q morning - 10a Anderson [place], RD   237 mL at 11/15/15 1050  . feeding supplement (PRO-STAT SUGAR FREE 64) liquid 30 mL  30 mL Oral BID Anderson [place], RD   30 mL at 11/15/15 1049  . folic acid (FOLVITE) tablet 1 mg  1 mg Oral BID Briscoe Deutscher, MD   1 mg at 11/15/15 1050  . hydrocerin (EUCERIN) cream   Topical BID Dorothea Ogle, MD      . hydrochlorothiazide (HYDRODIURIL) tablet 25 mg  25 mg Oral q morning - 10a Briscoe Deutscher, MD   25 mg at  11/15/15 1050  . loratadine (CLARITIN) tablet 10 mg  10 mg Oral Daily Briscoe Deutscher, MD   10 mg at 11/15/15 1050  . metoprolol (LOPRESSOR) injection 2.5 mg  2.5 mg Intravenous 4 times per day Briscoe Deutscher, MD   2.5 mg at 11/15/15 8338  . multivitamin with minerals tablet 1 tablet  1 tablet Oral Daily Briscoe Deutscher, MD   1 tablet at 11/15/15 1050  . ondansetron (ZOFRAN) injection 4 mg  4 mg Intravenous Q6H PRN Briscoe Deutscher, MD      . RESOURCE THICKENUP CLEAR   Oral PRN Dorothea Ogle, MD      . silver sulfADIAZINE (SILVADENE) 1 % cream 1 application  1 application Topical Daily Briscoe Deutscher, MD   1 application at 11/15/15 1051  . simvastatin (ZOCOR) tablet 40 mg  40 mg Oral QHS Briscoe Deutscher, MD   40 mg at 11/14/15 2255     Discharge Medications: Please see discharge summary for a list of discharge medications.  Relevant Imaging Results:  Relevant Lab Results:   Additional Information SS # [id number]  Avary Pitsenbarger, Dickey Gave, LCSW

## 2015-11-15 NOTE — Evaluation (Signed)
Physical Therapy Evaluation Patient Details Name: [name] MRN: [id number] DOB: [birth year] Today's Date: 11/15/2015   History of Present Illness  Pt was admitted with confusion,agitation, acute encephalopathy. She has PMH significant for ASCVD, RA, CABG, chronic low back pain  Clinical Impression  On eval, pt required Mod-Max assist +2 for mobility-assisted pt onto bsc then into recliner. Recommend ST rehab at SNF. Pt will absolutely need +2 assist and 24 hour supervision if she is to d/c home (per chart, family stating they may take pt home)    Follow Up Recommendations SNF;Supervision/Assistance - 24 hour    Equipment Recommendations       Recommendations for Other Services       Precautions / Restrictions Precautions Precautions: Fall Restrictions Weight Bearing Restrictions: No      Mobility  Bed Mobility Overal bed mobility: Needs Assistance Bed Mobility: Supine to Sit     Supine to sit: Mod assist;+2 for safety/equipment Sit to supine: Mod assist;+2 for physical assistance   General bed mobility comments: assist for trunk and LEs. Increased time. Posterior lean and LOB.   Transfers Overall transfer level: Needs assistance Equipment used: 2 person hand held assist Transfers: Sit to/from [employer] Sit to Stand: Max assist;+2 physical assistance;+2 safety/equipment Stand pivot transfers: Max assist;+2 physical assistance;+2 safety/equipment       General transfer comment: assist to power up, stabilize, weightshift.  Pt moved RLE but not LLE during transfer. Bed >BSC, then [facility] >recliner  Ambulation/Gait                Stairs            Wheelchair Mobility    Modified Rankin (Stroke Patients Only)       Balance Overall balance assessment: Needs assistance Sitting-balance support: Feet supported;No upper extremity supported Sitting balance-Leahy Scale: Poor Sitting balance - Comments: min guard to min A with  multimodal cues to maintain static balance Postural control: Posterior lean   Standing balance-Leahy Scale: Poor                               Pertinent Vitals/Pain Pain Assessment: No/denies pain    Home Living Family/patient expects to be discharged to:: Unsure Living Arrangements: Alone               Additional Comments: was living alone with family checking in    Prior Function Level of Independence: Independent               Hand Dominance        Extremity/Trunk Assessment   Upper Extremity Assessment: Defer to OT evaluation           Lower Extremity Assessment:  (Strength ~3-/5 throughout; impaired coordination)      Cervical / Trunk Assessment: Kyphotic  Communication   Communication: No difficulties  Cognition Arousal/Alertness: Awake/alert Behavior During Therapy: WFL for tasks assessed/performed Overall Cognitive Status: No family/caregiver present to determine baseline cognitive functioning Area of Impairment: Following commands;Safety/judgement;Awareness;Attention               General Comments: pt followed all commands with extra time; was talking to son at one point and he was not in room    General Comments      Exercises        Assessment/Plan    PT Assessment Patient needs continued PT services  PT Diagnosis Difficulty walking;Generalized weakness   PT Problem List Decreased  strength;Decreased activity tolerance;Decreased balance;Decreased mobility;Decreased knowledge of use of DME;Decreased cognition;Decreased safety awareness  PT Treatment Interventions Functional mobility training;Therapeutic activities;Patient/family education;Balance training;Therapeutic exercise   PT Goals (Current goals can be found in the Care Plan section) Acute Rehab PT Goals Patient Stated Goal: none stated PT Goal Formulation: Patient unable to participate in goal setting Time For Goal Achievement: 11/29/15 Potential to Achieve  Goals: Good    Frequency Min 3X/week   Barriers to discharge        Co-evaluation   Reason for Co-Treatment: For patient/therapist safety PT goals addressed during session: Mobility/safety with mobility OT goals addressed during session: ADL's and self-care       End of Session Equipment Utilized During Treatment: Gait belt Activity Tolerance: Patient tolerated treatment well Patient left: in chair;with call bell/phone within reach;with chair alarm set           Time: 6568-1275 PT Time Calculation (min) (ACUTE ONLY): 18 min   Charges:   PT Evaluation $Initial PT Evaluation Tier I: 1 Procedure     PT G Codes:        Rebeca Alert, MPT Pager: [phone]

## 2015-11-15 NOTE — Progress Notes (Addendum)
Patient ID: [name], female   DOB: [birth year], 81 y.o.   MRN: [id number]  [facility] PROGRESS NOTE  [name] EVO:[id number] DOB: [birth year] DOA: 11/13/2015 PCP: [name], MD   Brief narrative:    81 y.o. female with ASCVD status post AAA repair and CABG, rheumatoid arthritis, and chronic low back pain who is brought to the ED by family with concerns regarding acute confusion and agitation.   In ED, patient had stable VS except hypothermia and T 90.5 F, glucose in 50's and noncontrast CT of the head and neck were obtained, and there was a small right frontal scalp hematoma, but the studies were otherwise negative for acute processes. Metabolic panel revealed mild elevation in the transaminases and a mild hypoalbuminemia, but the electrolytes and renal function were within the normal limits. CBC demonstrated a stable normocytic anemia and leukopenia and lactic acid 1.0. Urinalysis revealed many bacteria and positive leukocytes, as well as triple phosphate crystals, and were sent for culture. Patient was given IV fluids and an empiric dose of Rocephin for suspected urinary tract infection and the hospitalists were asked to admit.  Assessment/Plan:    Principal Problem:   Acute encephalopathy - suspect this is metabolic and related to UTI sepsis, dehydration  - better and more alert this am  - continue treatment with IV Rocephin for UTI - ? Need for SNF, to discuss with family   Active Problems:   Sepsis secondary to UTI [facility]) - unclear pathogen at this this time - pt met criteria for sepsis with T 90.5 F, HT < 90, WBC <4, suspected source UTI - continue Rocephin day #2, follow up on urine culture     Chronic diastolic CHF - Last echo on file (09/14/2015), EF 29-93%, grade 2 diastolic dysfunction, PA peak pressure 56 - Patient is clinically dry, stop IVF - resume Lasix in AM    Chronic venous stasis with ulcers - Several ulcers on bilateral LEs -  Left proximal wound: 1.5cm x 2cm with depth not known due to the presence of firmly adherent yellow slough. No exudate - Left distal wound: 1.6cm x 1.4cm x 0.4cm with 25% of wound bed obscured by yellow slough,  - Right medial proximal wound: 3cm x 2cm, 100% firmly adherent yellow slough. - Right distal wound: 3cm x 1.5cm With 50% of wound covered in yellow slough and 50% of wound pink and dry. No exudate. - Right lateral wound: 3.5cm x 3cm depth unable to determine due to presence of necrotic tissue covering 100% of wound bed. - wound care to the ulcers that will support enzymatic debridement of the necrotic wound beds (collagenase/Santyl) in addition to addressing the chronic dryness with Eucerin twice daily - appreciate wound care team assistance     Pancytopenia ([place]) - review of records indicate that WBC and Plt count were WNL 2 months ago - ? Plaquenil vs Methotrexate effect in the setting of acute illness UTI sepsis  - blood counts improving overall  - CBC in AM    CORONARY ATHEROSCLEROSIS, ARTERY BYPASS GRAFT - continue with Aspirin and statin     Malnutrition of moderate degree - appreciate nutritionist assistance     Transaminitis - AST > ALT, improving  - repeat CMET in AM    Essential hypertension - reasonable inpatient control - continue HCTZ and Metoprolol     Rheumatoid arthritis (HCC) - Plaquenil and Methotrexate still on hold  DVT prophylaxis - Lovenox SQ  Code Status: DNR Family  Communication:  plan of care discussed with the patient, no family at bedside  Disposition Plan: Not ready for d/c, suspect later this week   IV access:  Peripheral IV  Procedures and diagnostic studies:    Dg Chest 2 View 11/13/2015  Healing fractures of the right posterior sixth and seventh ribs. No pneumothorax. No edema or consolidation. Stable cardiac prominence and aortic tortuosity.   Ct Head Wo Contrast 11/13/2015  CT head: Age related volume loss with mild  periventricular small vessel disease. No intracranial mass, hemorrhage, or extra-axial fluid collection. No evidence of acute infarct. Small right frontal scalp hematoma. CT cervical spine: Postoperative change from C4-C6. No fracture. Slight spondylolisthesis at C3-4, felt to be due to underlying spondylosis. No other spondylolisthesis. Multilevel arthropathy.   Medical Consultants:  None   Other Consultants:  PT/OT Nutritionist   IAnti-Infectives:   Rocephin 12/20 -->  [name], MD  [facility] Pager [phone]  If 7PM-7AM, please contact night-coverage [email or web address] Password [facility] 11/15/2015, 10:22 AM  HPI/Subjective: No events overnight.   Objective: Filed Vitals:   11/14/15 1344 11/14/15 1800 11/14/15 2047 11/15/15 0527  BP: 108/38 113/36 131/43 126/50  Pulse: 99  109 106  Temp: 99.4 F (37.4 C)  99.9 F (37.7 C) 98.5 F (36.9 C)  TempSrc: Oral  Oral Oral  Resp: '18  18 18  ' Height:      Weight:    58.2 kg (128 lb 4.9 oz)  SpO2: 94%  95% 94%    Intake/Output Summary (Last 24 hours) at 11/15/15 1022 Last data filed at 11/15/15 0825  Gross per 24 hour  Intake 1652.17 ml  Output      0 ml  Net 1652.17 ml    Exam:   General:  Pt is alert, NAD  Cardiovascular: Regular rate and rhythm, no rubs, no gallops  Respiratory: Clear to auscultation bilaterally, no wheezing, diminished breath sounds at bases   Abdomen: Soft, non tender, non distended, bowel sounds present, no guarding  Data Reviewed: Basic Metabolic Panel:  Recent Labs Lab 11/13/15 1737 11/13/15 2043 11/14/15 0240 11/15/15 0451  NA 144  --  144 141  K 3.8  --  4.1 3.5  CL 101  --  103 100*  CO2 31  --  30 29  GLUCOSE 99  --  61* 77  BUN 19  --  17 14  CREATININE 0.54  --  0.50 0.70  CALCIUM 9.3  --  9.1 8.3*  MG  --  1.7  --   --    Liver Function Tests:  Recent Labs Lab 11/13/15 1737 11/15/15 0451  AST 68* 43*  ALT 55* 40  ALKPHOS 100 85  BILITOT 1.1 0.9  PROT 6.4* 5.5*   ALBUMIN 3.2* 2.7*    Recent Labs Lab 11/13/15 2225  AMMONIA 17   CBC:  Recent Labs Lab 11/13/15 1737 11/15/15 0451  WBC 3.5* 6.3  NEUTROABS 2.7 4.4  HGB 9.8* 9.7*  HCT 30.4* 29.9*  MCV 85.2 86.2  PLT 111* 125*   Cardiac Enzymes:  Recent Labs Lab 11/13/15 2043 11/14/15 0240 11/14/15 0825  TROPONINI <0.03 <0.03 <0.03    Scheduled Meds: . aspirin EC  81 mg Oral QHS  . cefTRIAXone   IV  1 g Intravenous Q24H  . cholecalciferol  1,000 Units Oral Daily  . enoxaparin  injection  40 mg Subcutaneous Q24H  . folic acid  1 mg Oral BID  . hydrocerin   Topical BID  . hydrochlorothiazide  25 mg Oral q morning - 10a  . loratadine  10 mg Oral Daily  . metoprolol  2.5 mg Intravenous 4 times per day  . simvastatin  40 mg Oral QHS   Continuous Infusions: . 0.9 % NaCl with KCl 20 mEq / L 50 mL/hr at 11/15/15 [phone]

## 2015-11-15 NOTE — Progress Notes (Signed)
Occupational Therapy Treatment Patient Details Name: [name] MRN: [id number] DOB: 1932/06/25 Today's Date: 11/15/2015    History of present illness Pt was admitted with confusion,agitation, acute encephalopathy. She has PMH significant for ASCVD, RA, CABG, chronic low back pain   OT comments  Pt much more alert today. Able to move bil UEs but not using well functionally.  Needs max A +2 for transfers  Follow Up Recommendations  SNF (if home needs 2 caregivers at same time to assist)    Equipment Recommendations  3 in 1 bedside comode    Recommendations for Other Services      Precautions / Restrictions Precautions Precautions: Fall Restrictions Weight Bearing Restrictions: No       Mobility Bed Mobility           Sit to supine: Mod assist;+2 for physical assistance   General bed mobility comments: Pt mostly managed legs, very minimal assistance with L.  Needed assist with trunk to sit up.  Had tendency to lean posteriorly and did not attempt to self correct  Transfers Overall transfer level: Needs assistance Equipment used: 2 person hand held assist Transfers: Sit to/from [employer] Transfers Sit to Stand: Max assist;+2 physical assistance Stand pivot transfers: Max assist;+2 physical assistance       General transfer comment: assist to power up and stabilize.  Pt moved RLE but not LLE during transfer    Balance Overall balance assessment: Needs assistance Sitting-balance support: Feet supported;No upper extremity supported Sitting balance-Leahy Scale: Poor Sitting balance - Comments: min guard to min A with multimodal cues to maintain static balance                           ADL Overall ADL's : Needs assistance/impaired                         Toilet Transfer: Maximal assistance;+2 for physical assistance;Stand-pivot;BSC   Toileting- Clothing Manipulation and Hygiene: Total assistance;+2 for physical assistance;Sit  to/from stand         General ADL Comments: Pt has very weak UE's.  Able to lift bil to 90, but R took more effort and was slower.  She states she has RA, bil hands swollen and red.  She is able to make a fist.  Poor use of hands during session--multimodal cues to use them.      Vision                     Perception     Praxis      Cognition   Behavior During Therapy: [facility] for tasks assessed/performed Overall Cognitive Status: No family/caregiver present to determine baseline cognitive functioning (h/o dementia)                  General Comments: pt followed all commands with extra time; was talking to son at one point and he was not in room    Extremity/Trunk Assessment               Exercises     Shoulder Instructions       General Comments      Pertinent Vitals/ Pain       Pain Assessment: No/denies pain  Home Living Family/patient expects to be discharged to:: Unsure Living Arrangements: Alone  Additional Comments: was living alone with family checking in      Prior Functioning/Environment Level of Independence: Independent            Frequency Min 2X/week     Progress Toward Goals  OT Goals(current goals can now be found in the care plan section)  Progress towards OT goals: Progressing toward goals     Plan Discharge plan remains appropriate    Co-evaluation    PT/OT/SLP Co-Evaluation/Treatment: Yes Reason for Co-Treatment: For patient/therapist safety PT goals addressed during session: Mobility/safety with mobility OT goals addressed during session: ADL's and self-care      End of Session     Activity Tolerance Patient limited by fatigue   Patient Left in chair;with call bell/phone within reach;with chair alarm set (maximove pad placed)   Nurse Communication Need for lift equipment        Time: 1115-5208 OT Time Calculation (min): 21 min  Charges: OT General  Charges $OT Visit: 1 Procedure OT Treatments $Self Care/Home Management : 8-22 mins  Damein Gaunce 11/15/2015, 12:41 PM  Marica Otter, OTR/L [phone] 11/15/2015

## 2015-11-16 ENCOUNTER — Inpatient Hospital Stay (HOSPITAL_COMMUNITY): Payer: Medicare Other

## 2015-11-16 ENCOUNTER — Ambulatory Visit: Payer: Medicare Other | Admitting: Internal Medicine

## 2015-11-16 LAB — CBC
HEMATOCRIT: 33 % — AB (ref 36.0–46.0)
Hemoglobin: 10.8 g/dL — ABNORMAL LOW (ref 12.0–15.0)
MCH: 28.1 pg (ref 26.0–34.0)
MCHC: 32.7 g/dL (ref 30.0–36.0)
MCV: 85.7 fL (ref 78.0–100.0)
PLATELETS: 106 10*3/uL — AB (ref 150–400)
RBC: 3.85 MIL/uL — ABNORMAL LOW (ref 3.87–5.11)
RDW: 17.2 % — AB (ref 11.5–15.5)
WBC: 6.8 10*3/uL (ref 4.0–10.5)

## 2015-11-16 LAB — COMPREHENSIVE METABOLIC PANEL
ALT: 42 U/L (ref 14–54)
ANION GAP: 10 (ref 5–15)
AST: 50 U/L — ABNORMAL HIGH (ref 15–41)
Albumin: 2.8 g/dL — ABNORMAL LOW (ref 3.5–5.0)
Alkaline Phosphatase: 91 U/L (ref 38–126)
BILIRUBIN TOTAL: 0.7 mg/dL (ref 0.3–1.2)
BUN: 9 mg/dL (ref 6–20)
CHLORIDE: 97 mmol/L — AB (ref 101–111)
CO2: 30 mmol/L (ref 22–32)
Calcium: 8.8 mg/dL — ABNORMAL LOW (ref 8.9–10.3)
Creatinine, Ser: 0.55 mg/dL (ref 0.44–1.00)
Glucose, Bld: 100 mg/dL — ABNORMAL HIGH (ref 65–99)
POTASSIUM: 3.8 mmol/L (ref 3.5–5.1)
Sodium: 137 mmol/L (ref 135–145)
TOTAL PROTEIN: 5.8 g/dL — AB (ref 6.5–8.1)

## 2015-11-16 MED ORDER — SENNOSIDES-DOCUSATE SODIUM 8.6-50 MG PO TABS
1.0000 | ORAL_TABLET | Freq: Two times a day (BID) | ORAL | Status: DC
  Administered 2015-11-16 – 2015-11-19 (×5): 1 via ORAL
  Filled 2015-11-16 (×5): qty 1

## 2015-11-16 MED ORDER — BISACODYL 10 MG RE SUPP
10.0000 mg | Freq: Once | RECTAL | Status: AC
  Administered 2015-11-16: 10 mg via RECTAL
  Filled 2015-11-16: qty 1

## 2015-11-16 NOTE — Progress Notes (Signed)
Speech Language Pathology Treatment: Dysphagia  Patient Details Name: [name] MRN: [id number] DOB: 26-Feb-1932 Today's Date: 11/16/2015 Time: 8185-6314 SLP Time Calculation (min) (ACUTE ONLY): 12 min  Assessment / Plan / Recommendation Clinical Impression  Pt required max tactile, verbal and visual cues to open eyes for more than 30 seconds. Wet vocal quality noted. Max verbal cues for throat clear was unsuccessful. Pt drooling from left oral cavity (lips cleaned). She refused tsp trials thick liquid or oatmeal. RN reported pt got very little sleep last night. Pt confused with irrelevant utterances; asking this SLP to leave. Only feed pt when she is adequately alert (DYs 2, nectar). Will continue to treat.     HPI HPI: 81 y.o. female with PMH of ASCVD status post AAA repair and CABG, GERD, rheumatoid arthritis, and chronic low back pain who is brought to the ED by family with concerns regarding acute confusion and agitation.  CT of the head and neck negative for acute processes. Urinalysis is suggestive of UTI. CT chest no pneumothorax. No edema or consolidation.      SLP Plan  Continue with current plan of care     Recommendations  Diet recommendations: Dysphagia 2 (fine chop);Nectar-thick liquid Liquids provided via: Cup;No straw Medication Administration: Crushed with puree Supervision: Staff to assist with self feeding;Full supervision/cueing for compensatory strategies Compensations: Slow rate;Small sips/bites Postural Changes and/or Swallow Maneuvers: Seated upright 90 degrees              Oral Care Recommendations: Oral care BID Follow up Recommendations:  (TBD) Plan: Continue with current plan of care   [name] 11/16/2015, 10:53 AM   [name] [name].Ed [employer] [phone]

## 2015-11-16 NOTE — Progress Notes (Signed)
Patient ID: [name], female   DOB: [birth year], 81 y.o.   MRN: [id number]  [facility] PROGRESS NOTE  [name] VOJ:[id number] DOB: 1932-09-07 DOA: 11/13/2015 PCP: Colleen Cowden, MD   Brief narrative:    81 y.o. female with ASCVD status post AAA repair and CABG, rheumatoid arthritis, and chronic low back pain who is brought to the ED by family with concerns regarding acute confusion and agitation.   In ED, patient had stable VS except hypothermia and T 90.5 F, glucose in 50's and noncontrast CT of the head and neck were obtained, and there was a small right frontal scalp hematoma, but the studies were otherwise negative for acute processes. Metabolic panel revealed mild elevation in the transaminases and a mild hypoalbuminemia, but the electrolytes and renal function were within the normal limits. CBC demonstrated a stable normocytic anemia and leukopenia and lactic acid 1.0. Urinalysis revealed many bacteria and positive leukocytes, as well as triple phosphate crystals, and were sent for culture. Patient was given IV fluids and an empiric dose of Rocephin for suspected urinary tract infection and the hospitalists were asked to admit.  Assessment/Plan:    Principal Problem:   Acute encephalopathy - suspect this is metabolic and related to UTI sepsis, dehydration  - was more alert yesterday but remains confused this morning - continue treatment with IV Rocephin for UTI - Plan is to discharge patient to skilled nursing facility once she is medically cleared  Active Problems:   Sepsis secondary to UTI ([place]) - unclear pathogen at this this time - pt met criteria for sepsis with T 90.5 F, HT < 90, WBC <4, suspected source UTI - continue Rocephin day #3, follow up on urine culture     Vomiting 1 episode this morning - Unclear etiology, abdominal x-ray requested to rule out obstruction, ileus - If persists, may need NG tube - Provide Zofran as needed    Chronic  diastolic CHF - Last echo on file (09/14/2015), EF 99-37%, grade 2 diastolic dysfunction, PA peak pressure 56 - Resume home regimen with Lasix    Chronic venous stasis with ulcers - Several ulcers on bilateral LEs - Left proximal wound: 1.5cm x 2cm with depth not known due to the presence of firmly adherent yellow slough. No exudate - Left distal wound: 1.6cm x 1.4cm x 0.4cm with 25% of wound bed obscured by yellow slough,  - Right medial proximal wound: 3cm x 2cm, 100% firmly adherent yellow slough. - Right distal wound: 3cm x 1.5cm With 50% of wound covered in yellow slough and 50% of wound pink and dry. No exudate. - Right lateral wound: 3.5cm x 3cm depth unable to determine due to presence of necrotic tissue covering 100% of wound bed. - wound care to the ulcers that will support enzymatic debridement of the necrotic wound beds (collagenase/Santyl) in addition to addressing the chronic dryness with Eucerin twice daily - appreciate wound care team assistance     Pancytopenia ([place]) - review of records indicate that WBC and Plt count were WNL 2 months ago - ? Plaquenil vs Methotrexate effect in the setting of acute illness UTI sepsis  - blood counts improving overall  - CBC in AM    CORONARY ATHEROSCLEROSIS, ARTERY BYPASS GRAFT - continue with Aspirin and statin     Malnutrition of moderate degree - appreciate nutritionist assistance     Transaminitis - AST > ALT, improving  - repeat CMET in AM    Essential hypertension - reasonable  inpatient control - continue HCTZ and Metoprolol     Rheumatoid arthritis (HCC) - Plaquenil and Methotrexate still on hold  DVT prophylaxis - Lovenox SQ  Code Status: DNR Family Communication:  plan of care discussed with the patient, no family at bedside  Disposition Plan: Not ready for d/c, suspect early next week  IV access:  Peripheral IV  Procedures and diagnostic studies:    Dg Chest 2 View 11/13/2015  Healing fractures of the  right posterior sixth and seventh ribs. No pneumothorax. No edema or consolidation. Stable cardiac prominence and aortic tortuosity.   Ct Head Wo Contrast 11/13/2015  CT head: Age related volume loss with mild periventricular small vessel disease. No intracranial mass, hemorrhage, or extra-axial fluid collection. No evidence of acute infarct. Small right frontal scalp hematoma. CT cervical spine: Postoperative change from C4-C6. No fracture. Slight spondylolisthesis at C3-4, felt to be due to underlying spondylosis. No other spondylolisthesis. Multilevel arthropathy.   Medical Consultants:  None   Other Consultants:  PT/OT Nutritionist   IAnti-Infectives:   Rocephin 12/20 -->  Colleen Ramsay, MD  [facility] Pager [phone]  If 7PM-7AM, please contact night-coverage [email or web address] Password [facility] 11/16/2015, 1:25 PM  HPI/Subjective: No events overnight. More confused this morning  Objective: Filed Vitals:   11/16/15 0103 11/16/15 0616 11/16/15 1123 11/16/15 1252  BP: 132/93 141/49 139/71   Pulse: 72 60 77   Temp: 97.5 F (36.4 C) 94.9 F (34.9 C)  97.5 F (36.4 C)  TempSrc: Axillary Rectal  Rectal  Resp: 20 20    Height:      Weight:    54.3 kg (119 lb 11.4 oz)  SpO2: 99% 100%      Intake/Output Summary (Last 24 hours) at 11/16/15 1325 Last data filed at 11/16/15 0700  Gross per 24 hour  Intake    150 ml  Output      0 ml  Net    150 ml    Exam:   General:  Pt is alert, confused, not in acute distress  Cardiovascular: Regular rate and rhythm, no rubs, no gallops  Respiratory: Clear to auscultation bilaterally, no wheezing, diminished breath sounds at bases   Abdomen: Soft, non tender, non distended, bowel sounds present, no guarding  Data Reviewed: Basic Metabolic Panel:  Recent Labs Lab 11/13/15 1737 11/13/15 2043 11/14/15 0240 11/15/15 0451 11/16/15 0429  NA 144  --  144 141 137  K 3.8  --  4.1 3.5 3.8  CL 101  --  103 100* 97*  CO2 31  --  '30 29  30  ' GLUCOSE 99  --  61* 77 100*  BUN 19  --  '17 14 9  ' CREATININE 0.54  --  0.50 0.70 0.55  CALCIUM 9.3  --  9.1 8.3* 8.8*  MG  --  1.7  --   --   --    Liver Function Tests:  Recent Labs Lab 11/13/15 1737 11/15/15 0451 11/16/15 0429  AST 68* 43* 50*  ALT 55* 40 42  ALKPHOS 100 85 91  BILITOT 1.1 0.9 0.7  PROT 6.4* 5.5* 5.8*  ALBUMIN 3.2* 2.7* 2.8*    Recent Labs Lab 11/13/15 2225  AMMONIA 17   CBC:  Recent Labs Lab 11/13/15 1737 11/13/15 2225 11/15/15 0451 11/16/15 0429  WBC 3.5*  --  6.3 6.8  NEUTROABS 2.7  --  4.4  --   HGB 9.8*  --  9.7* 10.8*  HCT 30.4* 29.2* 29.9* 33.0*  MCV 85.2  --  86.2 85.7  PLT 111*  --  125* 106*   Cardiac Enzymes:  Recent Labs Lab 11/13/15 2043 11/14/15 0240 11/14/15 0825  TROPONINI <0.03 <0.03 <0.03    Scheduled Meds: . aspirin EC  81 mg Oral QHS  . cefTRIAXone   IV  1 g Intravenous Q24H  . cholecalciferol  1,000 Units Oral Daily  . enoxaparin  injection  40 mg Subcutaneous Q24H  . folic acid  1 mg Oral BID  . hydrocerin   Topical BID  . hydrochlorothiazide  25 mg Oral q morning - 10a  . loratadine  10 mg Oral Daily  . metoprolol  2.5 mg Intravenous 4 times per day  . simvastatin  40 mg Oral QHS   Continuous Infusions:

## 2015-11-17 ENCOUNTER — Encounter: Payer: Self-pay | Admitting: Geriatric Medicine

## 2015-11-17 ENCOUNTER — Inpatient Hospital Stay (HOSPITAL_COMMUNITY): Payer: Medicare Other

## 2015-11-17 LAB — CBC
HCT: 31.6 % — ABNORMAL LOW (ref 36.0–46.0)
Hemoglobin: 10.2 g/dL — ABNORMAL LOW (ref 12.0–15.0)
MCH: 27.6 pg (ref 26.0–34.0)
MCHC: 32.3 g/dL (ref 30.0–36.0)
MCV: 85.4 fL (ref 78.0–100.0)
PLATELETS: 128 10*3/uL — AB (ref 150–400)
RBC: 3.7 MIL/uL — ABNORMAL LOW (ref 3.87–5.11)
RDW: 17.1 % — ABNORMAL HIGH (ref 11.5–15.5)
WBC: 6.8 10*3/uL (ref 4.0–10.5)

## 2015-11-17 LAB — URINE CULTURE: Special Requests: NORMAL

## 2015-11-17 LAB — BASIC METABOLIC PANEL
Anion gap: 15 (ref 5–15)
BUN: 16 mg/dL (ref 6–20)
CHLORIDE: 98 mmol/L — AB (ref 101–111)
CO2: 26 mmol/L (ref 22–32)
CREATININE: 0.7 mg/dL (ref 0.44–1.00)
Calcium: 8.8 mg/dL — ABNORMAL LOW (ref 8.9–10.3)
GFR calc Af Amer: >60 mL/min (ref >60)
Glucose, Bld: 79 mg/dL (ref 65–99)
Potassium: 4.2 mmol/L (ref 3.5–5.1)
SODIUM: 139 mmol/L (ref 135–145)

## 2015-11-17 MED ORDER — FUROSEMIDE 10 MG/ML IJ SOLN
20.0000 mg | Freq: Two times a day (BID) | INTRAMUSCULAR | Status: DC
  Administered 2015-11-17 – 2015-11-19 (×4): 20 mg via INTRAVENOUS
  Filled 2015-11-17 (×4): qty 2

## 2015-11-17 NOTE — Progress Notes (Addendum)
Patient ID: [name], female   DOB: 11/09/1932, 81 y.o.   MRN: [id number]  [facility] PROGRESS NOTE  [name] TOI:[id number] DOB: January 03, 1932 DOA: 11/13/2015 PCP: Colleen Cowden, MD   Brief narrative:    81 y.o. female with ASCVD status post AAA repair and CABG, rheumatoid arthritis, and chronic low back pain who is brought to the ED by family with concerns regarding acute confusion and agitation.   In ED, patient had stable VS except hypothermia and T 90.5 F, glucose in 50's and noncontrast CT of the head and neck were obtained, and there was a small right frontal scalp hematoma, but the studies were otherwise negative for acute processes. Metabolic panel revealed mild elevation in the transaminases and a mild hypoalbuminemia, but the electrolytes and renal function were within the normal limits. CBC demonstrated a stable normocytic anemia and leukopenia and lactic acid 1.0. Urinalysis revealed many bacteria and positive leukocytes, as well as triple phosphate crystals, and were sent for culture. Patient was given IV fluids and an empiric dose of Rocephin for suspected urinary tract infection and the hospitalists were asked to admit.  Assessment/Plan:    Principal Problem:   Acute encephalopathy - suspect this is metabolic and related to UTI sepsis, dehydration  - more alert this AM but remains confused this morning - continue treatment with IV Rocephin for UTI - Plan is to discharge patient to skilled nursing facility once she is medically cleared  Active Problems:   Sepsis secondary to UTI ([place]) - urine culture from 12/20 with proteus sensitive to Rocephin  - pt met criteria for sepsis with T 90.5 F, HT < 90, WBC <4, source Proteus UTI - continue Rocephin day #4, follow up on urine culture     Vomiting 1 episode 12/23 - resolved  - Provide Zofran as needed    Chronic diastolic CHF - Last echo on file (09/14/2015), EF 83-38%, grade 2 diastolic  dysfunction, PA peak pressure 56 - started Lasix 20 mg IV BID - weight 55 kg this AM, monitor daily weights     Chronic venous stasis with ulcers - Several ulcers on bilateral LEs - Left proximal wound: 1.5cm x 2cm with depth not known due to the presence of firmly adherent yellow slough. No exudate - Left distal wound: 1.6cm x 1.4cm x 0.4cm with 25% of wound bed obscured by yellow slough,  - Right medial proximal wound: 3cm x 2cm, 100% firmly adherent yellow slough. - Right distal wound: 3cm x 1.5cm With 50% of wound covered in yellow slough and 50% of wound pink and dry. No exudate. - Right lateral wound: 3.5cm x 3cm depth unable to determine due to presence of necrotic tissue covering 100% of wound bed. - wound care to the ulcers that will support enzymatic debridement of the necrotic wound beds (collagenase/Santyl) in addition to addressing the chronic dryness with Eucerin twice daily - appreciate wound care team assistance     Pancytopenia ([place]) - review of records indicate that WBC and Plt count were WNL 2 months ago - ? Plaquenil vs Methotrexate effect in the setting of acute illness UTI sepsis  - blood counts improving overall  - CBC in AM    CORONARY ATHEROSCLEROSIS, ARTERY BYPASS GRAFT - continue with Aspirin and statin     Malnutrition of moderate degree - appreciate nutritionist assistance     Transaminitis - AST > ALT, improving  - repeat CMET in AM    Essential hypertension - reasonable inpatient  control - continue HCTZ and Metoprolol     Rheumatoid arthritis (HCC) - Plaquenil and Methotrexate still on hold  DVT prophylaxis - Lovenox SQ  Code Status: DNR Family Communication:  plan of care discussed with the patient, no family at bedside  Disposition Plan: Not ready for d/c, suspect early next week  IV access:  Peripheral IV  Procedures and diagnostic studies:    Dg Chest 2 View 11/13/2015  Healing fractures of the right posterior sixth and seventh  ribs. No pneumothorax. No edema or consolidation. Stable cardiac prominence and aortic tortuosity.   Ct Head Wo Contrast 11/13/2015  CT head: Age related volume loss with mild periventricular small vessel disease. No intracranial mass, hemorrhage, or extra-axial fluid collection. No evidence of acute infarct. Small right frontal scalp hematoma. CT cervical spine: Postoperative change from C4-C6. No fracture. Slight spondylolisthesis at C3-4, felt to be due to underlying spondylosis. No other spondylolisthesis. Multilevel arthropathy.   Medical Consultants:  None   Other Consultants:  PT/OT Nutritionist   IAnti-Infectives:   Rocephin 12/20 -->  Colleen Ramsay, MD  [facility] Pager [phone]  If 7PM-7AM, please contact night-coverage [email or web address] Password [facility] 11/17/2015, 2:45 PM  HPI/Subjective: No events overnight. More confused this morning  Objective: Filed Vitals:   11/17/15 0035 11/17/15 0527 11/17/15 0612 11/17/15 1431  BP: 132/51 120/42 122/46 128/52  Pulse: 82 92 111 89  Temp:  99.4 F (37.4 C) 98.9 F (37.2 C) 98.7 F (37.1 C)  TempSrc:  Oral Oral Axillary  Resp:  19  18  Height:      Weight:   55.1 kg (121 lb 7.6 oz)   SpO2:  96%  98%    Intake/Output Summary (Last 24 hours) at 11/17/15 1445 Last data filed at 11/17/15 1122  Gross per 24 hour  Intake    150 ml  Output      0 ml  Net    150 ml    Exam:   General:  Pt is alert, remains confused, not in acute distress  Cardiovascular: Regular rate and rhythm, no rubs, no gallops  Respiratory: Clear to auscultation bilaterally, no wheezing, diminished breath sounds at bases   Abdomen: Soft, non tender, non distended, bowel sounds present, no guarding  Data Reviewed: Basic Metabolic Panel:  Recent Labs Lab 11/13/15 1737 11/13/15 2043 11/14/15 0240 11/15/15 0451 11/16/15 0429 11/17/15 0531  NA 144  --  144 141 137 139  K 3.8  --  4.1 3.5 3.8 4.2  CL 101  --  103 100* 97* 98*  CO2 31  --  '30  29 30 26  ' GLUCOSE 99  --  61* 77 100* 79  BUN 19  --  '17 14 9 16  ' CREATININE 0.54  --  0.50 0.70 0.55 0.70  CALCIUM 9.3  --  9.1 8.3* 8.8* 8.8*  MG  --  1.7  --   --   --   --    Liver Function Tests:  Recent Labs Lab 11/13/15 1737 11/15/15 0451 11/16/15 0429  AST 68* 43* 50*  ALT 55* 40 42  ALKPHOS 100 85 91  BILITOT 1.1 0.9 0.7  PROT 6.4* 5.5* 5.8*  ALBUMIN 3.2* 2.7* 2.8*    Recent Labs Lab 11/13/15 2225  AMMONIA 17   CBC:  Recent Labs Lab 11/13/15 1737 11/13/15 2225 11/15/15 0451 11/16/15 0429 11/17/15 0531  WBC 3.5*  --  6.3 6.8 6.8  NEUTROABS 2.7  --  4.4  --   --  HGB 9.8*  --  9.7* 10.8* 10.2*  HCT 30.4* 29.2* 29.9* 33.0* 31.6*  MCV 85.2  --  86.2 85.7 85.4  PLT 111*  --  125* 106* 128*   Cardiac Enzymes:  Recent Labs Lab 11/13/15 2043 11/14/15 0240 11/14/15 0825  TROPONINI <0.03 <0.03 <0.03   . aspirin EC  81 mg Oral QHS  . cefTRIAXone   IV  1 g Intravenous Q24H  . collagenase   Topical BID  . enoxaparin  injection  40 mg Subcutaneous Q24H  . hydrocerin   Topical BID  . hydrochlorothiazide  25 mg Oral q morning - 10a  . metoprolol  2.5 mg Intravenous 4 times per day  . senna-docusate  1 tablet Oral BID  . silver sulfADIAZINE  1 application Topical Daily  . simvastatin  40 mg Oral QHS

## 2015-11-18 LAB — CULTURE, BLOOD (ROUTINE X 2)
CULTURE: NO GROWTH
Culture: NO GROWTH

## 2015-11-18 LAB — BASIC METABOLIC PANEL
Anion gap: 15 (ref 5–15)
BUN: 16 mg/dL (ref 6–20)
CO2: 32 mmol/L (ref 22–32)
CREATININE: 0.72 mg/dL (ref 0.44–1.00)
Calcium: 8.8 mg/dL — ABNORMAL LOW (ref 8.9–10.3)
Chloride: 94 mmol/L — ABNORMAL LOW (ref 101–111)
Glucose, Bld: 89 mg/dL (ref 65–99)
POTASSIUM: 3.1 mmol/L — AB (ref 3.5–5.1)
SODIUM: 141 mmol/L (ref 135–145)

## 2015-11-18 LAB — CBC
HCT: 31.5 % — ABNORMAL LOW (ref 36.0–46.0)
Hemoglobin: 10.2 g/dL — ABNORMAL LOW (ref 12.0–15.0)
MCH: 28.1 pg (ref 26.0–34.0)
MCHC: 32.4 g/dL (ref 30.0–36.0)
MCV: 86.8 fL (ref 78.0–100.0)
PLATELETS: 127 10*3/uL — AB (ref 150–400)
RBC: 3.63 MIL/uL — AB (ref 3.87–5.11)
RDW: 17.1 % — ABNORMAL HIGH (ref 11.5–15.5)
WBC: 6.1 10*3/uL (ref 4.0–10.5)

## 2015-11-18 MED ORDER — LORAZEPAM 2 MG/ML IJ SOLN
0.5000 mg | Freq: Once | INTRAMUSCULAR | Status: AC
  Administered 2015-11-18: 0.5 mg via INTRAVENOUS
  Filled 2015-11-18: qty 1

## 2015-11-18 MED ORDER — METOPROLOL TARTRATE 1 MG/ML IV SOLN: 2.5000 mg | Freq: Four times a day (QID) | INTRAVENOUS | Status: DC | PRN

## 2015-11-18 NOTE — Progress Notes (Signed)
Patient ID: [name], female   DOB: Nov 07, 1932, 81 y.o.   MRN: [id number]  [facility] PROGRESS NOTE  [name] SFK:[id number] DOB: 29-May-1932 DOA: 11/13/2015 PCP: [name], MD   Brief narrative:    81 y.o. female with ASCVD status post AAA repair and CABG, rheumatoid arthritis, and chronic low back pain who is brought to the ED by family with concerns regarding acute confusion and agitation.   In ED, patient had stable VS except hypothermia and T 90.5 F, glucose in 50's and noncontrast CT of the head and neck were obtained, and there was a small right frontal scalp hematoma, but the studies were otherwise negative for acute processes. Metabolic panel revealed mild elevation in the transaminases and a mild hypoalbuminemia, but the electrolytes and renal function were within the normal limits. CBC demonstrated a stable normocytic anemia and leukopenia and lactic acid 1.0. Urinalysis revealed many bacteria and positive leukocytes, as well as triple phosphate crystals, and were sent for culture. Patient was given IV fluids and an empiric dose of Rocephin for suspected urinary tract infection and the hospitalists were asked to admit.  Assessment/Plan:    Principal Problem:   Acute encephalopathy - suspect this is metabolic and related to UTI sepsis, dehydration  - pt apparently got dose of Ativan this AM so now she is rather difficult to arouse - continue treatment with IV Rocephin for UTI - try to avoid sedating medications so that we can see if pt is medically improving  - Plan is to discharge patient to skilled nursing facility once she is medically cleared  Active Problems:   Sepsis secondary to UTI ([place]) - urine culture from 12/20 with proteus sensitive to Rocephin  - pt met criteria for sepsis with T 90.5 F, HT < 90, WBC <4, source Proteus UTI - continue Rocephin day #5, follow up on urine culture     Vomiting 1 episode 12/23 - resolved but pt now to  somnolent after dose of ativan  - try to encourage PO intake when pt is awake  - Provide Zofran as needed    Chronic diastolic CHF - Last echo on file (09/14/2015), EF 17-49%, grade 2 diastolic dysfunction, PA peak pressure 56 - started Lasix 20 mg IV BID - weight 55 --> 53 kg this AM, monitor daily weights     Chronic venous stasis with ulcers - Several ulcers on bilateral LEs - Left proximal wound: 1.5cm x 2cm with depth not known due to the presence of firmly adherent yellow slough. No exudate - Left distal wound: 1.6cm x 1.4cm x 0.4cm with 25% of wound bed obscured by yellow slough,  - Right medial proximal wound: 3cm x 2cm, 100% firmly adherent yellow slough. - Right distal wound: 3cm x 1.5cm With 50% of wound covered in yellow slough and 50% of wound pink and dry. No exudate. - Right lateral wound: 3.5cm x 3cm depth unable to determine due to presence of necrotic tissue covering 100% of wound bed. - wound care to the ulcers that will support enzymatic debridement of the necrotic wound beds (collagenase/Santyl) in addition to addressing the chronic dryness with Eucerin twice daily - appreciate wound care team assistance     Pancytopenia ([place]) - review of records indicate that WBC and Plt count were WNL 2 months ago - ? Plaquenil vs Methotrexate effect in the setting of acute illness UTI sepsis  - blood counts improving overall  - CBC in AM    Hypokalemia -  supplement and repeat BMP in AM     CORONARY ATHEROSCLEROSIS, ARTERY BYPASS GRAFT - continue with Aspirin and statin     Malnutrition of moderate degree - appreciate nutritionist assistance  - encourage PO intake when pt more alert     Transaminitis - AST > ALT, improving  - repeat CMET in AM    Essential hypertension - reasonable inpatient control - continue HCTZ and Metoprolol     Rheumatoid arthritis (HCC) - Plaquenil and Methotrexate still on hold  DVT prophylaxis - Lovenox SQ  Code Status: DNR Family  Communication:  plan of care discussed with the patient, no family at bedside  Disposition Plan: Not ready for d/c, suspect early next week  IV access:  Peripheral IV  Procedures and diagnostic studies:    Dg Chest 2 View 11/13/2015  Healing fractures of the right posterior sixth and seventh ribs. No pneumothorax. No edema or consolidation. Stable cardiac prominence and aortic tortuosity.   Ct Head Wo Contrast 11/13/2015  CT head: Age related volume loss with mild periventricular small vessel disease. No intracranial mass, hemorrhage, or extra-axial fluid collection. No evidence of acute infarct. Small right frontal scalp hematoma. CT cervical spine: Postoperative change from C4-C6. No fracture. Slight spondylolisthesis at C3-4, felt to be due to underlying spondylosis. No other spondylolisthesis. Multilevel arthropathy.   Medical Consultants:  None   Other Consultants:  PT/OT Nutritionist   IAnti-Infectives:   Rocephin 12/20 -->  [name], MD  [facility] Pager [phone]  If 7PM-7AM, please contact night-coverage [email or web address] Password [facility] 11/18/2015, 11:11 AM  HPI/Subjective: No events overnight. Rather somnolent   Objective: Filed Vitals:   11/17/15 0612 11/17/15 1431 11/17/15 2106 11/18/15 0427  BP: 122/46 128/52 127/47 115/41  Pulse: 111 89 78 80  Temp: 98.9 F (37.2 C) 98.7 F (37.1 C) 98.4 F (36.9 C) 98.2 F (36.8 C)  TempSrc: Oral Axillary Oral Axillary  Resp:  '18 18 18  ' Height:      Weight: 55.1 kg (121 lb 7.6 oz)   53 kg (116 lb 13.5 oz)  SpO2:  98% 97% 94%    Intake/Output Summary (Last 24 hours) at 11/18/15 1111 Last data filed at 11/18/15 0800  Gross per 24 hour  Intake     50 ml  Output      0 ml  Net     50 ml    Exam:   General:  Pt is somnolent and difficult to arouse, just got dose of Ativan one at 5:45 am   Cardiovascular: Regular rate and rhythm, no rubs, no gallops  Respiratory: no wheezing, diminished breath sounds at bases    Abdomen: Soft, non tender, non distended, bowel sounds present, no guarding  Data Reviewed: Basic Metabolic Panel:  Recent Labs Lab 11/13/15 2043 11/14/15 0240 11/15/15 0451 11/16/15 0429 11/17/15 0531 11/18/15 0647  NA  --  144 141 137 139 141  K  --  4.1 3.5 3.8 4.2 3.1*  CL  --  103 100* 97* 98* 94*  CO2  --  '30 29 30 26 ' 32  GLUCOSE  --  61* 77 100* 79 89  BUN  --  '17 14 9 16 16  ' CREATININE  --  0.50 0.70 0.55 0.70 0.72  CALCIUM  --  9.1 8.3* 8.8* 8.8* 8.8*  MG 1.7  --   --   --   --   --    Liver Function Tests:  Recent Labs Lab 11/13/15 1737 11/15/15 0451 11/16/15 0429  AST 68* 43* 50*  ALT 55* 40 42  ALKPHOS 100 85 91  BILITOT 1.1 0.9 0.7  PROT 6.4* 5.5* 5.8*  ALBUMIN 3.2* 2.7* 2.8*    Recent Labs Lab 11/13/15 2225  AMMONIA 17   CBC:  Recent Labs Lab 11/13/15 1737 11/13/15 2225 11/15/15 0451 11/16/15 0429 11/17/15 0531 11/18/15 0647  WBC 3.5*  --  6.3 6.8 6.8 6.1  NEUTROABS 2.7  --  4.4  --   --   --   HGB 9.8*  --  9.7* 10.8* 10.2* 10.2*  HCT 30.4* 29.2* 29.9* 33.0* 31.6* 31.5*  MCV 85.2  --  86.2 85.7 85.4 86.8  PLT 111*  --  125* 106* 128* 127*   Cardiac Enzymes:  Recent Labs Lab 11/13/15 2043 11/14/15 0240 11/14/15 0825  TROPONINI <0.03 <0.03 <0.03   Meds scheduled:  . aspirin EC  81 mg Oral QHS  . cefTRIAXone (ROCEPHIN)  IV  1 g Intravenous Q24H  . collagenase   Topical BID  . enoxaparin (LOVENOX) injection  40 mg Subcutaneous Q24H  . feeding supplement (ENSURE ENLIVE)  237 mL Oral q morning - 10a  . feeding supplement (PRO-STAT SUGAR FREE 64)  30 mL Oral BID  . furosemide  20 mg Intravenous BID  . hydrocerin   Topical BID  . hydrochlorothiazide  25 mg Oral q morning - 10a  . metoprolol  2.5 mg Intravenous 4 times per day  . senna-docusate  1 tablet Oral BID  . silver sulfADIAZINE  1 application Topical Daily  . simvastatin  40 mg Oral QHS

## 2015-11-18 NOTE — Progress Notes (Signed)
Patient's BP in the 90s, lopressor due, notified Dr. Izola Price and she stated to hold med.

## 2015-11-19 DIAGNOSIS — R410 Disorientation, unspecified: Secondary | ICD-10-CM

## 2015-11-19 LAB — CBC
HCT: 38.1 % (ref 36.0–46.0)
HEMOGLOBIN: 12.1 g/dL (ref 12.0–15.0)
MCH: 27.3 pg (ref 26.0–34.0)
MCHC: 31.8 g/dL (ref 30.0–36.0)
MCV: 86 fL (ref 78.0–100.0)
PLATELETS: 168 10*3/uL (ref 150–400)
RBC: 4.43 MIL/uL (ref 3.87–5.11)
RDW: 17 % — ABNORMAL HIGH (ref 11.5–15.5)
WBC: 8.1 10*3/uL (ref 4.0–10.5)

## 2015-11-19 LAB — BASIC METABOLIC PANEL
ANION GAP: 14 (ref 5–15)
BUN: 19 mg/dL (ref 6–20)
CHLORIDE: 89 mmol/L — AB (ref 101–111)
CO2: 36 mmol/L — ABNORMAL HIGH (ref 22–32)
Calcium: 9.2 mg/dL (ref 8.9–10.3)
Creatinine, Ser: 0.68 mg/dL (ref 0.44–1.00)
GFR calc Af Amer: >60 mL/min (ref >60)
GLUCOSE: 114 mg/dL — AB (ref 65–99)
POTASSIUM: 2.8 mmol/L — AB (ref 3.5–5.1)
SODIUM: 139 mmol/L (ref 135–145)

## 2015-11-19 MED ORDER — HYDROCODONE-ACETAMINOPHEN 5-325 MG PO TABS: 1.0000 | ORAL_TABLET | Freq: Four times a day (QID) | ORAL | Status: DC | PRN

## 2015-11-19 MED ORDER — HYDROCERIN EX CREA: 1.0000 "application " | TOPICAL_CREAM | Freq: Two times a day (BID) | CUTANEOUS | Status: DC

## 2015-11-19 MED ORDER — CIPROFLOXACIN HCL 500 MG PO TABS: 500.0000 mg | ORAL_TABLET | Freq: Two times a day (BID) | ORAL | Status: DC

## 2015-11-19 MED ORDER — COLLAGENASE 250 UNIT/GM EX OINT: TOPICAL_OINTMENT | Freq: Two times a day (BID) | CUTANEOUS | Status: DC

## 2015-11-19 MED ORDER — RESOURCE THICKENUP CLEAR PO POWD: 1.0000 | ORAL | Status: DC | PRN

## 2015-11-19 MED ORDER — FUROSEMIDE 40 MG PO TABS: 20.0000 mg | ORAL_TABLET | Freq: Two times a day (BID) | ORAL | Status: DC

## 2015-11-19 MED ORDER — STARCH (THICKENING) PO POWD
ORAL | Status: DC | PRN
  Filled 2015-11-19: qty 227

## 2015-11-19 MED ORDER — RESOURCE THICKENUP CLEAR PO POWD
ORAL | Status: DC | PRN
  Filled 2015-11-19: qty 125

## 2015-11-19 MED ORDER — POTASSIUM CHLORIDE CRYS ER 20 MEQ PO TBCR
40.0000 meq | EXTENDED_RELEASE_TABLET | Freq: Two times a day (BID) | ORAL | Status: DC
  Administered 2015-11-19: 40 meq via ORAL
  Filled 2015-11-19: qty 2

## 2015-11-19 MED ORDER — SENNOSIDES-DOCUSATE SODIUM 8.6-50 MG PO TABS: 1.0000 | ORAL_TABLET | Freq: Two times a day (BID) | ORAL | Status: AC

## 2015-11-19 NOTE — Clinical Social Work Placement (Addendum)
Patient is set to discharge to [facility] today. Blue Medicare authorization obtained (auth#: [id number]). Patient & son, [name] aware. Discharge packet given to RN, Victorino Dike. [facility] called for transport.     Lincoln Maxin, LCSW [facility] Clinical Social Worker cell #: [phone]    CLINICAL SOCIAL WORK PLACEMENT  NOTE  Date:  11/19/2015  Patient Details  Name: [name] MRN: [id number] Date of Birth: [birth year]  Clinical Social Work is seeking post-discharge placement for this patient at the Skilled  Nursing Facility level of care (*CSW will initial, date and re-position this form in  chart as items are completed):  No   Patient/family provided with [facility] Clinical Social Work Department's list of facilities offering this level of care within the geographic area requested by the patient (or if unable, by the patient's family).  Yes   Patient/family informed of their freedom to choose among providers that offer the needed level of care, that participate in Medicare, Medicaid or managed care program needed by the patient, have an available bed and are willing to accept the patient.  No   Patient/family informed of 's ownership interest in [facility] and [facility], as well as of the fact that they are under no obligation to receive care at these facilities.  PASRR submitted to EDS on       PASRR number received on       Existing PASRR number confirmed on 11/15/15     FL2 transmitted to all facilities in geographic area requested by pt/family on 11/15/15     FL2 transmitted to all facilities within larger geographic area on       Patient informed that his/her managed care company has contracts with or will negotiate with certain facilities, including the following:        Yes   Patient/family informed of bed offers received.  Patient chooses bed at [facility]     Physician recommends and patient chooses bed at       Patient to be transferred to [facility] on 11/19/15.  Patient to be transferred to facility by [facility]     Patient family notified on 11/19/15 of transfer.  Name of family member notified:  patient's son, [name] via phone     PHYSICIAN       Additional Comment:    _______________________________________________ Arlyss Repress, LCSW 11/19/2015, 2:56 PM

## 2015-11-19 NOTE — Discharge Summary (Addendum)
Physician Discharge Summary  [name] KAJ:[id number] DOB: 09-27-1932 DOA: 11/13/2015  PCP: [name], MD  Admit date: 11/13/2015 Discharge date: 11/19/2015  Recommendations for Outpatient Follow-up:  1. Pt will need to follow up with PCP in 1-2 weeks post discharge 2. Please obtain BMP to evaluate electrolytes and kidney function, potassium level 3. Potassium has been supplemented with K-dur total 80 MEQ prior to discharge  4. Please also check CBC to evaluate Hg and Hct levels 5. Please also note that pt needs to continue taking Cipro upon discharge to complete therapy for proteus UTI, for 5 more days  6. Pt with chronic LE ulcerations that will require wound care to the ulcers that will support enzymatic debridement of the necrotic wound beds (collagenase/Santyl) in addition to addressing the chronic dryness with Eucerin twice daily 7. Please note dys II diet recommendation with aspiration precautions   Discharge Diagnoses:  Principal Problem:   Acute encephalopathy Active Problems:   Sepsis secondary to UTI ([place])   Pancytopenia (HCC)   CORONARY ATHEROSCLEROSIS, ARTERY BYPASS GRAFT   Malnutrition of moderate degree   Transaminitis   Chronic diastolic congestive heart failure ([place])  Discharge Condition: Stable  Diet recommendation: Dys II    Brief narrative:    81 y.o. female with ASCVD status post AAA repair and CABG, rheumatoid arthritis, and chronic low back pain who is brought to the ED by family with concerns regarding acute confusion and agitation.   In ED, patient had stable VS except hypothermia and T 90.5 F, glucose in 50's and noncontrast CT of the head and neck were obtained, and there was a small right frontal scalp hematoma, but the studies were otherwise negative for acute processes. Metabolic panel revealed mild elevation in the transaminases and a mild hypoalbuminemia, but the electrolytes and renal function were within the normal limits.  CBC demonstrated a stable normocytic anemia and leukopenia and lactic acid 1.0. Urinalysis revealed many bacteria and positive leukocytes, as well as triple phosphate crystals, and were sent for culture. Patient was given IV fluids and an empiric dose of Rocephin for suspected urinary tract infection and the hospitalists were asked to admit.  Assessment/Plan:    Principal Problem:  Acute encephalopathy - suspect this is metabolic and related to UTI sepsis, dehydration  - pt more alert this AM but intermittently confused  - transition Rocephin to Cipro and plan for d/c if bed available  - try to avoid sedating medications so that we can see if pt is medically improving   Active Problems:  Sepsis secondary to UTI ([place]) - urine culture from 12/20 with proteus sensitive to Rocephin and Cipro  - pt met criteria for sepsis with T 90.5 F, HT < 90, WBC <4, source Proteus UTI - continue Cipro upon discharge for 5 more days    Vomiting 1 episode 12/23 - resolved   Chronic diastolic CHF - Last echo on file (09/14/2015), EF 03-55%, grade 2 diastolic dysfunction, PA peak pressure 56 - started Lasix 20 mg IV BID - weight 55 --> 53 --> 52 kg this AM   Chronic venous stasis with ulcers - Several ulcers on bilateral LEs - Left proximal wound: 1.5cm x 2cm with depth not known due to the presence of firmly adherent yellow slough. No exudate - Left distal wound: 1.6cm x 1.4cm x 0.4cm with 25% of wound bed obscured by yellow slough,  - Right medial proximal wound: 3cm x 2cm, 100% firmly adherent yellow slough. - Right distal wound:  3cm x 1.5cm With 50% of wound covered in yellow slough and 50% of wound pink and dry. No exudate. - Right lateral wound: 3.5cm x 3cm depth unable to determine due to presence of necrotic tissue covering 100% of wound bed. - wound care to the ulcers that will support enzymatic debridement of the necrotic wound beds (collagenase/Santyl) in addition to addressing  the chronic dryness with Eucerin twice daily - appreciate wound care team assistance    Pancytopenia ([place]) - review of records indicate that WBC and Plt count were WNL 2 months ago - ? Plaquenil vs Methotrexate effect in the setting of acute illness UTI sepsis  - resolved  - resume her home medical regimen upon discharge with close monitoring of blood counts on those medications    Hypokalemia - supplemented and can be rechecked at the facility    CORONARY ATHEROSCLEROSIS, ARTERY BYPASS GRAFT - continue with Aspirin and statin    Malnutrition of moderate degree - appreciate nutritionist assistance  - dys II diet recommended    Transaminitis - AST > ALT, improving    Essential hypertension - reasonable inpatient control - continue HCTZ and Metoprolol    Rheumatoid arthritis ([place]) - Plaquenil and Methotrexate can be resumed upon discharge as blood counts stable    Code Status: DNR Family Communication: plan of care discussed with the patient, no family at bedside  Disposition Plan: SNF  IV access:  Peripheral IV  Procedures and diagnostic studies:   Dg Chest 2 View 11/13/2015 Healing fractures of the right posterior sixth and seventh ribs. No pneumothorax. No edema or consolidation. Stable cardiac prominence and aortic tortuosity.   Ct Head Wo Contrast 11/13/2015 CT head: Age related volume loss with mild periventricular small vessel disease. No intracranial mass, hemorrhage, or extra-axial fluid collection. No evidence of acute infarct. Small right frontal scalp hematoma. CT cervical spine: Postoperative change from C4-C6. No fracture. Slight spondylolisthesis at C3-4, felt to be due to underlying spondylosis. No other spondylolisthesis. Multilevel arthropathy.   Medical Consultants:  None   Other Consultants:  PT/OT Nutritionist   IAnti-Infectives:   Rocephin 12/20 --> transitioned to Cipro upon discharge   [name],  MD [facility] Pager [phone]       Discharge Exam: Filed Vitals:   11/18/15 2051 11/19/15 0510  BP: 142/59 115/54  Pulse: 101 115  Temp: 97.7 F (36.5 C) 98.4 F (36.9 C)  Resp: 22 20   Filed Vitals:   11/18/15 1403 11/18/15 1759 11/18/15 2051 11/19/15 0510  BP: 114/46 96/39 142/59 115/54  Pulse: 92 102 101 115  Temp: 98.2 F (36.8 C)  97.7 F (36.5 C) 98.4 F (36.9 C)  TempSrc: Axillary  Oral Oral  Resp: '18  22 20  ' Height:      Weight:    52.5 kg (115 lb 11.9 oz)  SpO2: 95%  95% 94%    General: Pt is alert, follows commands appropriately, not in acute distress Cardiovascular: Regular rate and rhythm, S1/S2 +, no murmurs, no rubs, no gallops Respiratory: Clear to auscultation bilaterally, no wheezing, no crackles, no rhonchi Abdominal: Soft, non tender, non distended, bowel sounds +, no guarding  Discharge Instructions  Discharge Instructions    Diet - low sodium heart healthy    Complete by:  As directed      Increase activity slowly    Complete by:  As directed             Medication List    STOP taking these  medications        hydrOXYzine 25 MG tablet  Commonly known as:  ATARAX/VISTARIL      TAKE these medications        acetaminophen 500 MG tablet  Commonly known as:  TYLENOL  Take 500 mg by mouth every 6 (six) hours as needed (for pain.).     aspirin EC 81 MG tablet  Take 81 mg by mouth at bedtime.     calcium carbonate 500 MG chewable tablet  Commonly known as:  TUMS - dosed in mg elemental calcium  Chew 1 tablet by mouth daily as needed for indigestion or heartburn.     cetirizine 10 MG tablet  Commonly known as:  ZYRTEC  Take 10 mg by mouth daily as needed. Alternating with Hydroxyzine  For allergies     cholecalciferol 1000 UNITS tablet  Commonly known as:  VITAMIN D  Take 1,000 Units by mouth daily.     ciprofloxacin 500 MG tablet  Commonly known as:  CIPRO  Take 1 tablet (500 mg total) by mouth 2 (two) times daily.      collagenase ointment  Commonly known as:  SANTYL  Apply topically 2 (two) times daily.     COMPLETE MULTI-VITAMIN PO  Take 1 tablet by mouth daily.     DENTA 5000 PLUS 1.1 % Crea dental cream  Generic drug:  sodium fluoride  BRUSH 3 TIMES A DAY     folic acid 1 MG tablet  Commonly known as:  FOLVITE  Take 1 mg by mouth 2 (two) times daily.     furosemide 40 MG tablet  Commonly known as:  LASIX  Take 0.5 tablets (20 mg total) by mouth 2 (two) times daily.     hydrocerin Crea  Apply 1 application topically 2 (two) times daily.     hydrochlorothiazide 25 MG tablet  Commonly known as:  HYDRODIURIL  TAKE 1 TABLET BY MOUTH EVERY MORNING     HYDROcodone-acetaminophen 5-325 MG tablet  Commonly known as:  NORCO/VICODIN  Take 1 tablet by mouth every 6 (six) hours as needed for moderate pain.     hydroxychloroquine 200 MG tablet  Commonly known as:  PLAQUENIL  Take 200 mg by mouth 2 (two) times daily.     methotrexate 2.5 MG tablet  Commonly known as:  RHEUMATREX  Take 15 mg by mouth once a week. Caution:Chemotherapy. Protect from light. Take on Wednesday     metoprolol succinate 50 MG 24 hr tablet  Commonly known as:  TOPROL-XL  TAKE 1 TABLET BY MOUTH EVERY DAY     NASONEX 50 MCG/ACT nasal spray  Generic drug:  mometasone  Place 2 sprays into the nose daily as needed (allergies).     potassium chloride SA 20 MEQ tablet  Commonly known as:  K-DUR,KLOR-CON  Take 20 mEq by mouth See admin instructions. Take 1 tablet daily as needed for low potassium. (Only take when needed with the lasix)     RESOURCE THICKENUP CLEAR Powd  Take 120 g by mouth as needed.     senna-docusate 8.6-50 MG tablet  Commonly known as:  Senokot-S  Take 1 tablet by mouth 2 (two) times daily.     silver sulfADIAZINE 1 % cream  Commonly known as:  SILVADENE  Apply 1 application topically daily.     simvastatin 40 MG tablet  Commonly known as:  ZOCOR  TAKE 1 TABLET BY MOUTH AT BEDTIME  Follow-up Information    Follow up with [name], MD.   Specialty:  Internal Medicine   Contact information:   [address] [place] [address] [phone]        The results of significant diagnostics from this hospitalization (including imaging, microbiology, ancillary and laboratory) are listed below for reference.     Microbiology: Recent Results (from the past 240 hour(s))  Urine culture     Status: None   Collection Time: 11/13/15  6:25 PM  Result Value Ref Range Status   Specimen Description URINE, CATHETERIZED  Final   Special Requests Normal  Final   Culture   Final    >=100,000 COLONIES/mL PROTEUS MIRABILIS Performed at [facility]    Report Status 11/17/2015 FINAL  Final   Organism ID, Bacteria PROTEUS MIRABILIS  Final      Susceptibility   Proteus mirabilis - MIC*    AMPICILLIN <=2 SENSITIVE Sensitive     CEFAZOLIN <=4 SENSITIVE Sensitive     CEFTRIAXONE <=1 SENSITIVE Sensitive     CIPROFLOXACIN <=0.25 SENSITIVE Sensitive     GENTAMICIN <=1 SENSITIVE Sensitive     IMIPENEM 0.5 SENSITIVE Sensitive     NITROFURANTOIN 64 RESISTANT Resistant     TRIMETH/SULFA <=20 SENSITIVE Sensitive     AMPICILLIN/SULBACTAM <=2 SENSITIVE Sensitive     PIP/TAZO <=4 SENSITIVE Sensitive     * >=100,000 COLONIES/mL PROTEUS MIRABILIS  Culture, blood (routine x 2)     Status: None   Collection Time: 11/13/15  8:43 PM  Result Value Ref Range Status   Specimen Description BLOOD LEFT HAND  Final   Special Requests IN PEDIATRIC BOTTLE 2CC  Final   Culture   Final    NO GROWTH 5 DAYS Performed at [facility]    Report Status 11/18/2015 FINAL  Final  Culture, blood (routine x 2)     Status: None   Collection Time: 11/13/15 10:25 PM  Result Value Ref Range Status   Specimen Description BLOOD LEFT ANTECUBITAL  Final   Special Requests IN PEDIATRIC BOTTLE 2ML  Final   Culture   Final    NO GROWTH 5 DAYS Performed at [facility]    Report Status 11/18/2015 FINAL  Final     Labs: Basic Metabolic Panel:  Recent Labs Lab 11/13/15 2043  11/15/15 0451 11/16/15 0429 11/17/15 0531 11/18/15 0647 11/19/15 0515  NA  --   < > 141 137 139 141 139  K  --   < > 3.5 3.8 4.2 3.1* 2.8*  CL  --   < > 100* 97* 98* 94* 89*  CO2  --   < > '29 30 26 ' 32 36*  GLUCOSE  --   < > 77 100* 79 89 114*  BUN  --   < > '14 9 16 16 19  ' CREATININE  --   < > 0.70 0.55 0.70 0.72 0.68  CALCIUM  --   < > 8.3* 8.8* 8.8* 8.8* 9.2  MG 1.7  --   --   --   --   --   --   < > = values in this interval not displayed. Liver Function Tests:  Recent Labs Lab 11/13/15 1737 11/15/15 0451 11/16/15 0429  AST 68* 43* 50*  ALT 55* 40 42  ALKPHOS 100 85 91  BILITOT 1.1 0.9 0.7  PROT 6.4* 5.5* 5.8*  ALBUMIN 3.2* 2.7* 2.8*   No results for input(s): LIPASE, AMYLASE in the  last 168 hours.  Recent Labs Lab 11/13/15 2225  AMMONIA 17   CBC:  Recent Labs Lab 11/13/15 1737  11/15/15 0451 11/16/15 0429 11/17/15 0531 11/18/15 0647 11/19/15 0515  WBC 3.5*  --  6.3 6.8 6.8 6.1 8.1  NEUTROABS 2.7  --  4.4  --   --   --   --   HGB 9.8*  --  9.7* 10.8* 10.2* 10.2* 12.1  HCT 30.4*  < > 29.9* 33.0* 31.6* 31.5* 38.1  MCV 85.2  --  86.2 85.7 85.4 86.8 86.0  PLT 111*  --  125* 106* 128* 127* 168  < > = values in this interval not displayed. Cardiac Enzymes:  Recent Labs Lab 11/13/15 2043 11/14/15 0240 11/14/15 0825  TROPONINI <0.03 <0.03 <0.03   BNP: BNP (last 3 results)  Recent Labs  11/13/15 2030  BNP 683.3*    SIGNED: Time coordinating discharge: 30 minutes  [name], MD  [facility] 11/19/2015, 8:47 AM Pager [phone]  If 7PM-7AM, please contact night-coverage [email or web address] Password [email or web address]

## 2015-11-19 NOTE — Progress Notes (Signed)
Nutrition Follow-up  DOCUMENTATION CODES:   Non-severe (moderate) malnutrition in context of acute illness/injury  INTERVENTION:  - Continue Ensure Enlive once/day and Prostat BID - Will order Resource ThickenUp PRN for use with Ensure Enlive and all other liquids which are not pre-thickened - Provide encouragement with intakes of meals and supplements - RD will continue to monitor for needs  NUTRITION DIAGNOSIS:   Inadequate oral intake related to lethargy/confusion as evidenced by meal completion < 25%. -mainly ongoing  GOAL:   Patient will meet greater than or equal to 90% of their needs -unmet  MONITOR:   PO intake, Supplement acceptance, Weight trends, Labs, Skin, I & O's  ASSESSMENT:   81 y.o. female with PMH of ASCVD status post AAA repair and CABG, rheumatoid arthritis, and chronic low back pain who is brought to the ED by family with concerns regarding acute confusion and agitation. Given the patient's acute confusional state, she is unable to provide a history at this time and so information obtained from the patient's daughter, son, and ED physician. Patient lives alone and typically cares for herself, but has family members checking in on her daily. 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 On 11/12/2015, at about 6 PM, the patient was visited by a niece who noted the patient to be confused and called the patient's son to inform him of this. Ms. Joe was taken over to her son's house where she spent the night, reportedly fidgeting and repeating, "I have to turn it off" throughout the night without sleep. She has a wobbly gait at baseline and falls frequently, but her family does not believe there was an acute trauma.   12/26 Per rounds this AM, pt remains confused. D/c order in place but no d/c summary at this time. Per chart review, pt ate 65% of  breakfast this AM and 0-15% of meals yesterday (12/25) and 12/24. Noted that this RD had ordered Ensure Enlive on 12/21 but pt on nectar-thick liquids and order for Resource ThickenUp not in place in order to thicken this supplement; will amend this at this time.   Not meeting needs. Medications reviewed. Labs reviewed; K: 2.8 mmol/L, Cl: 89 mmol/L.   12/21 - Pt has been confused since admission and remains so this AM.  - No family in the room to provide information from PTA.  - Breakfast tray sitting in front of pt untouched; RD questions if pt is able to feed herself with current presentation and pt may need assistance during meal times.  - No other intakes documented since admission.  - Will order supplements to assist - Physical assessment showed mild muscle and fat wasting to upper body.  - Did not assess legs given multiple wounds and redness bilaterally.  - Per chart review, pt has lost 8 lbs (6% body weight) in the past 16 days which is significant for time frame.    Diet Order:  DIET DYS 2 Room service appropriate?: Yes; Fluid consistency:: Nectar Thick Diet - low sodium heart healthy  Skin:  Wound (see comment) (Stage 2 pressure ulcer to coccyx)  Last BM:  12/24  Height:   Ht Readings from Last 1 Encounters:  11/13/15 5\' 2"  (1.575 m)    Weight:   Wt Readings from Last 1 Encounters:  11/19/15 115 lb 11.9 oz (52.5 kg)    Ideal Body Weight:  50 kg (kg)  BMI:  Body mass index is 21.16 kg/(m^2).  Estimated Nutritional Needs:   Kcal:  1400-1600  Protein:  50-60 grams  Fluid:  1.8-2.1 L/day  EDUCATION NEEDS:   No education needs identified at this time     Trenton Gammon, RD, LDN Inpatient Clinical Dietitian Pager # [phone] After hours/weekend pager # [phone]

## 2015-11-19 NOTE — Progress Notes (Signed)
Physical Therapy Treatment Patient Details Name: KRYSTIANNA SOTH MRN: [id number] DOB: Mar 28, 1932 Today's Date: 11/19/2015    History of Present Illness Pt was admitted with confusion,agitation, acute encephalopathy. She has PMH significant for ASCVD, RA, CABG, chronic low back pain    PT Comments    Pt continues to require +2 assist for all mobility tasks. Improved ability to move UE/LEs against gravity compared to last session. Postural awareness and balance remains impaired, as well as cognition. Remains a high fall risk. Pt participates well with sessions. Continue to recommend SNF.   Follow Up Recommendations  SNF;Supervision/Assistance - 24 hour     Equipment Recommendations       Recommendations for Other Services       Precautions / Restrictions Precautions Precautions: Fall Restrictions Weight Bearing Restrictions: No    Mobility  Bed Mobility Overal bed mobility: Needs Assistance Bed Mobility: Supine to Sit     Supine to sit: Mod assist;+2 for physical assistance;+2 for safety/equipment     General bed mobility comments: Assist for trunk and bil LEs. Increased time. Poor static and dynamic balance  Transfers Overall transfer level: Needs assistance Equipment used: Rolling walker (2 wheeled) Transfers: Sit to/from Stand Sit to Stand: Max assist;+2 physical assistance;+2 safety/equipment Stand pivot transfers: Max assist;+2 physical assistance;+2 safety/equipment       General transfer comment: assist to power up, stabilize, weightshift, position/advance L LE, maneuver walker.  Stand pivot with RW, bed to bsc. Very ataxic  Ambulation/Gait   Ambulation Distance (Feet): 2 Feet Assistive device: Rolling walker (2 wheeled) Gait Pattern/deviations: Ataxic;Step-to pattern     General Gait Details: Severely ataxic, L worse than R. High fall risk   Stairs            Wheelchair Mobility    Modified Rankin (Stroke Patients Only)       Balance      Sitting balance-Leahy Scale: Poor Sitting balance - Comments: Poor postural awareness and righting reactions. Multidirectional LOB. Min-Mod assist for static sitting balance.     Standing balance-Leahy Scale: Zero                      Cognition Arousal/Alertness: Awake/alert Behavior During Therapy: WFL for tasks assessed/performed Overall Cognitive Status: No family/caregiver present to determine baseline cognitive functioning Area of Impairment: Following commands;Safety/judgement;Awareness;Orientation Orientation Level: Disoriented to;Time;Situation;Place Current Attention Level: Sustained Memory: Decreased short-term memory Following Commands: Follows one step commands with increased time Safety/Judgement: Decreased awareness of safety;Decreased awareness of deficits     General Comments: pt followed all commands with extra time; still seeing things that aren't there    Exercises      General Comments        Pertinent Vitals/Pain Pain Assessment: Faces Pain Score: 3  Pain Location: wounds on feet  Pain Intervention(s): Monitored during session;Repositioned    Home Living                      Prior Function            PT Goals (current goals can now be found in the care plan section) Progress towards PT goals: Progressing toward goals    Frequency  Min 3X/week    PT Plan Current plan remains appropriate    Co-evaluation             End of Session Equipment Utilized During Treatment: Gait belt Activity Tolerance: Patient tolerated treatment well Patient left: in chair;with call bell/phone within  reach;with chair alarm set     Time: [phone] PT Time Calculation (min) (ACUTE ONLY): 35 min  Charges:  $Therapeutic Activity: 23-37 mins                    G Codes:      Rebeca Alert, MPT Pager: [phone]

## 2015-11-19 NOTE — Progress Notes (Signed)
Report called to [facility] Heneritta Adongo RN accepting report for the facility. VSS and no acute changes noted in Pt's assessment at the time of transfer.

## 2015-11-19 NOTE — Discharge Instructions (Signed)

## 2015-11-19 NOTE — Care Management Note (Signed)
Case Management Note  Patient Details  Name: MAKYLAH BOSSARD MRN: [id number] Date of Birth: 05-22-1932  Subjective/Objective:                    Action/Plan:d/c SNF.   Expected Discharge Date:   (unknown)               Expected Discharge Plan:  Skilled Nursing Facility  In-House Referral:  Clinical Social Work  Discharge planning Services  CM Consult  Post Acute Care Choice:    Choice offered to:     DME Arranged:    DME Agency:     HH Arranged:    HH Agency:     Status of Service:  Completed, signed off  Medicare Important Message Given:    Date Medicare IM Given:    Medicare IM give by:    Date Additional Medicare IM Given:    Additional Medicare Important Message give by:     If discussed at Long Length of Stay Meetings, dates discussed:    Additional Comments:  Lanier Clam, RN 11/19/2015, 10:58 AM

## 2015-11-20 ENCOUNTER — Telehealth: Payer: Self-pay

## 2015-11-20 ENCOUNTER — Encounter (HOSPITAL_BASED_OUTPATIENT_CLINIC_OR_DEPARTMENT_OTHER): Payer: Medicare Other | Attending: General Surgery

## 2015-11-20 NOTE — Telephone Encounter (Signed)
Try to make hospital follow up but unable to contact the pt

## 2015-11-21 ENCOUNTER — Telehealth: Payer: Self-pay | Admitting: Internal Medicine

## 2015-11-21 NOTE — Telephone Encounter (Signed)
Carmen, I saw you have been trying to get in touch with pt. Her son has called back. Please call him.

## 2015-11-21 NOTE — Telephone Encounter (Signed)
2nd attempt for TCM 

## 2015-11-21 NOTE — Telephone Encounter (Signed)
Homero Fellers, the pt's son called saying he'd like to discuss what was done to his mother while she was in the hospital last week. Please give him a call.  Pt's ph# [phone] Thank you.

## 2015-11-22 ENCOUNTER — Ambulatory Visit: Payer: Medicare Other | Admitting: Internal Medicine

## 2015-11-22 NOTE — Telephone Encounter (Signed)
I called pt son and he wants to talk either to [facility] or Dr Kirtland Bouchard

## 2015-11-22 NOTE — Telephone Encounter (Signed)
Dr. Kirtland Bouchard, please see message and call Mr. Pacifico pt's son he questions for you regarding pt. Thanks

## 2015-11-22 NOTE — Telephone Encounter (Signed)
Spoke to pt's son Mr. Kallen,  wanted to let Dr. Kirtland Bouchard know that she is at [facility]. He said pt's cognitive condition has improved a little in the past week, but still confusion and aggitation at times. Asked if he could message through My Chart? Told him yes, and I will send this message to Dr. Kirtland Bouchard and have him call you on Tuesday when he returns to the office so he can answer your questions. [name] verbalized understanding.

## 2015-11-23 ENCOUNTER — Encounter (HOSPITAL_COMMUNITY): Payer: Self-pay | Admitting: Nurse Practitioner

## 2015-11-23 ENCOUNTER — Emergency Department (HOSPITAL_COMMUNITY): Payer: Medicare Other

## 2015-11-23 ENCOUNTER — Inpatient Hospital Stay (HOSPITAL_COMMUNITY)
Admission: EM | Admit: 2015-11-23 | Discharge: 2015-12-10 | DRG: 640 | Disposition: A | Discharge status: Hospice | Payer: Medicare Other | Attending: Internal Medicine | Admitting: Internal Medicine

## 2015-11-23 DIAGNOSIS — E876 Hypokalemia: Secondary | ICD-10-CM | POA: Diagnosis not present

## 2015-11-23 DIAGNOSIS — Z880 Allergy status to penicillin: Secondary | ICD-10-CM

## 2015-11-23 DIAGNOSIS — I2510 Atherosclerotic heart disease of native coronary artery without angina pectoris: Secondary | ICD-10-CM | POA: Diagnosis present

## 2015-11-23 DIAGNOSIS — I10 Essential (primary) hypertension: Secondary | ICD-10-CM | POA: Diagnosis not present

## 2015-11-23 DIAGNOSIS — Z66 Do not resuscitate: Secondary | ICD-10-CM | POA: Diagnosis present

## 2015-11-23 DIAGNOSIS — Z7982 Long term (current) use of aspirin: Secondary | ICD-10-CM

## 2015-11-23 DIAGNOSIS — Z7189 Other specified counseling: Secondary | ICD-10-CM | POA: Insufficient documentation

## 2015-11-23 DIAGNOSIS — R0682 Tachypnea, not elsewhere classified: Secondary | ICD-10-CM

## 2015-11-23 DIAGNOSIS — G629 Polyneuropathy, unspecified: Secondary | ICD-10-CM | POA: Diagnosis present

## 2015-11-23 DIAGNOSIS — Z8249 Family history of ischemic heart disease and other diseases of the circulatory system: Secondary | ICD-10-CM

## 2015-11-23 DIAGNOSIS — J45909 Unspecified asthma, uncomplicated: Secondary | ICD-10-CM | POA: Diagnosis present

## 2015-11-23 DIAGNOSIS — L8990 Pressure ulcer of unspecified site, unspecified stage: Secondary | ICD-10-CM | POA: Diagnosis not present

## 2015-11-23 DIAGNOSIS — I5032 Chronic diastolic (congestive) heart failure: Secondary | ICD-10-CM | POA: Diagnosis present

## 2015-11-23 DIAGNOSIS — R680 Hypothermia, not associated with low environmental temperature: Secondary | ICD-10-CM | POA: Diagnosis present

## 2015-11-23 DIAGNOSIS — R627 Adult failure to thrive: Secondary | ICD-10-CM | POA: Diagnosis not present

## 2015-11-23 DIAGNOSIS — M069 Rheumatoid arthritis, unspecified: Secondary | ICD-10-CM | POA: Diagnosis present

## 2015-11-23 DIAGNOSIS — E440 Moderate protein-calorie malnutrition: Secondary | ICD-10-CM | POA: Diagnosis present

## 2015-11-23 DIAGNOSIS — E870 Hyperosmolality and hypernatremia: Secondary | ICD-10-CM | POA: Diagnosis present

## 2015-11-23 DIAGNOSIS — R0902 Hypoxemia: Secondary | ICD-10-CM

## 2015-11-23 DIAGNOSIS — Z823 Family history of stroke: Secondary | ICD-10-CM

## 2015-11-23 DIAGNOSIS — F22 Delusional disorders: Secondary | ICD-10-CM | POA: Diagnosis not present

## 2015-11-23 DIAGNOSIS — E785 Hyperlipidemia, unspecified: Secondary | ICD-10-CM | POA: Diagnosis present

## 2015-11-23 DIAGNOSIS — I872 Venous insufficiency (chronic) (peripheral): Secondary | ICD-10-CM | POA: Diagnosis present

## 2015-11-23 DIAGNOSIS — E860 Dehydration: Principal | ICD-10-CM | POA: Diagnosis present

## 2015-11-23 DIAGNOSIS — R4702 Dysphasia: Secondary | ICD-10-CM | POA: Diagnosis present

## 2015-11-23 DIAGNOSIS — F0281 Dementia in other diseases classified elsewhere with behavioral disturbance: Secondary | ICD-10-CM | POA: Diagnosis present

## 2015-11-23 DIAGNOSIS — I878 Other specified disorders of veins: Secondary | ICD-10-CM | POA: Diagnosis present

## 2015-11-23 DIAGNOSIS — F05 Delirium due to known physiological condition: Secondary | ICD-10-CM | POA: Diagnosis present

## 2015-11-23 DIAGNOSIS — G8929 Other chronic pain: Secondary | ICD-10-CM | POA: Diagnosis present

## 2015-11-23 DIAGNOSIS — J9601 Acute respiratory failure with hypoxia: Secondary | ICD-10-CM | POA: Diagnosis not present

## 2015-11-23 DIAGNOSIS — Z951 Presence of aortocoronary bypass graft: Secondary | ICD-10-CM

## 2015-11-23 DIAGNOSIS — I25810 Atherosclerosis of coronary artery bypass graft(s) without angina pectoris: Secondary | ICD-10-CM | POA: Diagnosis present

## 2015-11-23 DIAGNOSIS — D6489 Other specified anemias: Secondary | ICD-10-CM | POA: Diagnosis present

## 2015-11-23 DIAGNOSIS — L89152 Pressure ulcer of sacral region, stage 2: Secondary | ICD-10-CM | POA: Diagnosis present

## 2015-11-23 DIAGNOSIS — R4189 Other symptoms and signs involving cognitive functions and awareness: Secondary | ICD-10-CM | POA: Insufficient documentation

## 2015-11-23 DIAGNOSIS — Z9119 Patient's noncompliance with other medical treatment and regimen: Secondary | ICD-10-CM

## 2015-11-23 DIAGNOSIS — Z515 Encounter for palliative care: Secondary | ICD-10-CM | POA: Diagnosis present

## 2015-11-23 DIAGNOSIS — Z9114 Patient's other noncompliance with medication regimen: Secondary | ICD-10-CM

## 2015-11-23 DIAGNOSIS — R32 Unspecified urinary incontinence: Secondary | ICD-10-CM | POA: Diagnosis present

## 2015-11-23 DIAGNOSIS — K219 Gastro-esophageal reflux disease without esophagitis: Secondary | ICD-10-CM | POA: Diagnosis present

## 2015-11-23 DIAGNOSIS — G9340 Encephalopathy, unspecified: Secondary | ICD-10-CM | POA: Diagnosis present

## 2015-11-23 DIAGNOSIS — I110 Hypertensive heart disease with heart failure: Secondary | ICD-10-CM | POA: Diagnosis present

## 2015-11-23 DIAGNOSIS — L97219 Non-pressure chronic ulcer of right calf with unspecified severity: Secondary | ICD-10-CM | POA: Diagnosis present

## 2015-11-23 DIAGNOSIS — Z9181 History of falling: Secondary | ICD-10-CM

## 2015-11-23 DIAGNOSIS — F0391 Unspecified dementia with behavioral disturbance: Secondary | ICD-10-CM | POA: Diagnosis present

## 2015-11-23 DIAGNOSIS — L97229 Non-pressure chronic ulcer of left calf with unspecified severity: Secondary | ICD-10-CM | POA: Diagnosis present

## 2015-11-23 DIAGNOSIS — M545 Low back pain: Secondary | ICD-10-CM | POA: Diagnosis present

## 2015-11-23 DIAGNOSIS — R1310 Dysphagia, unspecified: Secondary | ICD-10-CM | POA: Diagnosis present

## 2015-11-23 DIAGNOSIS — Z79899 Other long term (current) drug therapy: Secondary | ICD-10-CM

## 2015-11-23 DIAGNOSIS — B370 Candidal stomatitis: Secondary | ICD-10-CM | POA: Diagnosis present

## 2015-11-23 LAB — URINALYSIS, ROUTINE W REFLEX MICROSCOPIC
GLUCOSE, UA: NEGATIVE mg/dL
HGB URINE DIPSTICK: NEGATIVE
Ketones, ur: NEGATIVE mg/dL
Leukocytes, UA: NEGATIVE
Nitrite: NEGATIVE
PH: 5 (ref 5.0–8.0)
Protein, ur: NEGATIVE mg/dL
SPECIFIC GRAVITY, URINE: 1.022 (ref 1.005–1.030)

## 2015-11-23 LAB — CBC WITH DIFFERENTIAL/PLATELET
BASOS ABS: 0 10*3/uL (ref 0.0–0.1)
Basophils Relative: 0 %
Eosinophils Absolute: 0.2 10*3/uL (ref 0.0–0.7)
Eosinophils Relative: 1 %
HCT: 44.5 % (ref 36.0–46.0)
Hemoglobin: 13.7 g/dL (ref 12.0–15.0)
LYMPHS ABS: 1.8 10*3/uL (ref 0.7–4.0)
Lymphocytes Relative: 11 %
MCH: 27.9 pg (ref 26.0–34.0)
MCHC: 30.8 g/dL (ref 30.0–36.0)
MCV: 90.6 fL (ref 78.0–100.0)
MONO ABS: 0.8 10*3/uL (ref 0.1–1.0)
MONOS PCT: 5 %
Neutro Abs: 13.5 10*3/uL — ABNORMAL HIGH (ref 1.7–7.7)
Neutrophils Relative %: 83 %
PLATELETS: 430 10*3/uL — AB (ref 150–400)
RBC: 4.91 MIL/uL (ref 3.87–5.11)
RDW: 17.2 % — AB (ref 11.5–15.5)
WBC: 16.3 10*3/uL — AB (ref 4.0–10.5)

## 2015-11-23 LAB — COMPREHENSIVE METABOLIC PANEL
ALK PHOS: 94 U/L (ref 38–126)
ALT: 22 U/L (ref 14–54)
AST: 33 U/L (ref 15–41)
Albumin: 3.9 g/dL (ref 3.5–5.0)
Anion gap: 18 — ABNORMAL HIGH (ref 5–15)
BILIRUBIN TOTAL: 1 mg/dL (ref 0.3–1.2)
BUN: 47 mg/dL — AB (ref 6–20)
CHLORIDE: 99 mmol/L — AB (ref 101–111)
CO2: 38 mmol/L — AB (ref 22–32)
CREATININE: 1 mg/dL (ref 0.44–1.00)
Calcium: 9.7 mg/dL (ref 8.9–10.3)
GFR calc Af Amer: 60 mL/min — ABNORMAL LOW (ref >60)
GFR, EST NON AFRICAN AMERICAN: 51 mL/min — AB (ref >60)
GLUCOSE: 104 mg/dL — AB (ref 65–99)
POTASSIUM: 3.3 mmol/L — AB (ref 3.5–5.1)
Sodium: 155 mmol/L — ABNORMAL HIGH (ref 135–145)
Total Protein: 8 g/dL (ref 6.5–8.1)

## 2015-11-23 MED ORDER — ALBUTEROL SULFATE (2.5 MG/3ML) 0.083% IN NEBU: 2.5000 mg | INHALATION_SOLUTION | RESPIRATORY_TRACT | Status: DC | PRN

## 2015-11-23 MED ORDER — POTASSIUM CHLORIDE 10 MEQ/100ML IV SOLN
10.0000 meq | INTRAVENOUS | Status: AC
  Administered 2015-11-23 (×3): 10 meq via INTRAVENOUS
  Filled 2015-11-23 (×3): qty 100

## 2015-11-23 MED ORDER — SODIUM CHLORIDE 0.9 % IV BOLUS (SEPSIS)
500.0000 mL | Freq: Once | INTRAVENOUS | Status: AC
  Administered 2015-11-23: 500 mL via INTRAVENOUS

## 2015-11-23 MED ORDER — COLLAGENASE 250 UNIT/GM EX OINT
TOPICAL_OINTMENT | Freq: Every day | CUTANEOUS | Status: DC
  Administered 2015-11-23 – 2015-11-25 (×3): via TOPICAL
  Filled 2015-11-23: qty 30

## 2015-11-23 MED ORDER — HYDROXYCHLOROQUINE SULFATE 200 MG PO TABS
200.0000 mg | ORAL_TABLET | Freq: Two times a day (BID) | ORAL | Status: DC
  Administered 2015-11-27 – 2015-11-29 (×2): 200 mg via ORAL
  Filled 2015-11-23 (×35): qty 1

## 2015-11-23 MED ORDER — ASPIRIN EC 81 MG PO TBEC
81.0000 mg | DELAYED_RELEASE_TABLET | Freq: Every day | ORAL | Status: DC
  Filled 2015-11-23 (×19): qty 1

## 2015-11-23 MED ORDER — METHOTREXATE 2.5 MG PO TABS
15.0000 mg | ORAL_TABLET | ORAL | Status: DC
  Filled 2015-11-23 (×2): qty 6

## 2015-11-23 MED ORDER — SIMVASTATIN 40 MG PO TABS
40.0000 mg | ORAL_TABLET | Freq: Every day | ORAL | Status: DC
  Filled 2015-11-23 (×18): qty 1

## 2015-11-23 MED ORDER — FOLIC ACID 1 MG PO TABS
1.0000 mg | ORAL_TABLET | Freq: Two times a day (BID) | ORAL | Status: DC
  Administered 2015-11-27 – 2015-11-29 (×2): 1 mg via ORAL
  Filled 2015-11-23 (×35): qty 1

## 2015-11-23 MED ORDER — ENOXAPARIN SODIUM 30 MG/0.3ML ~~LOC~~ SOLN
30.0000 mg | SUBCUTANEOUS | Status: DC
  Administered 2015-11-23 – 2015-12-06 (×9): 30 mg via SUBCUTANEOUS
  Filled 2015-11-23 (×15): qty 0.3

## 2015-11-23 MED ORDER — DEXTROSE-NACL 5-0.45 % IV SOLN
INTRAVENOUS | Status: DC
  Administered 2015-11-23 – 2015-11-24 (×2): via INTRAVENOUS

## 2015-11-23 MED ORDER — SENNOSIDES-DOCUSATE SODIUM 8.6-50 MG PO TABS
1.0000 | ORAL_TABLET | Freq: Two times a day (BID) | ORAL | Status: DC
  Administered 2015-11-29: 1 via ORAL
  Filled 2015-11-23 (×35): qty 1

## 2015-11-23 MED ORDER — FLUCONAZOLE 100 MG PO TABS
100.0000 mg | ORAL_TABLET | Freq: Every day | ORAL | Status: DC
  Filled 2015-11-23 (×8): qty 1

## 2015-11-23 MED ORDER — ACETAMINOPHEN 650 MG RE SUPP: 650.0000 mg | Freq: Four times a day (QID) | RECTAL | Status: DC | PRN

## 2015-11-23 MED ORDER — LORAZEPAM 2 MG/ML IJ SOLN
0.2500 mg | Freq: Once | INTRAMUSCULAR | Status: AC
  Administered 2015-11-23: 0.25 mg via INTRAVENOUS
  Filled 2015-11-23: qty 1

## 2015-11-23 MED ORDER — ACETAMINOPHEN 325 MG PO TABS: 650.0000 mg | ORAL_TABLET | Freq: Four times a day (QID) | ORAL | Status: DC | PRN

## 2015-11-23 MED ORDER — METOPROLOL SUCCINATE ER 50 MG PO TB24
50.0000 mg | ORAL_TABLET | Freq: Every day | ORAL | Status: DC
  Filled 2015-11-23 (×7): qty 1

## 2015-11-23 MED ORDER — VITAMIN D3 25 MCG (1000 UNIT) PO TABS
1000.0000 [IU] | ORAL_TABLET | Freq: Every day | ORAL | Status: DC
  Filled 2015-11-23 (×18): qty 1

## 2015-11-23 NOTE — ED Notes (Signed)
Patient resides at [facility] for past week. Physician at clinic has been treating her for UTI but she has been refusing to take her medications and showing worsening signs of dehydration and abnormal lab work. She is also increasingly combative towards staff which has limited her care. The physician at the [facility] requested her to come to the hospital for IV therapy and treatment due to her poor PO intake.

## 2015-11-23 NOTE — Consult Note (Addendum)
WOC consult requested for bilat leg wounds.  WOC just left [facility] to travel to another facility so wounds were not visualized today; assessment will be performed on Mon.  Pt is familiar to [facility] team; refer to recent admission on 12/21 for wound assessment and measurements.  She has multiple full thickness wounds to BLE of mixed etiology and Santyl was ordered for enzymatic debridement at that time.  Continue with this plan of care; orders provided for staff nurses to perform daily dressing changes and discussed via phone call.  Float heels to reduce pressure. Cammie Mcgee MSN, RN, CWOCN, [place], CNS [phone]

## 2015-11-23 NOTE — ED Notes (Addendum)
Tried to call report to floor; nurse is in dressing change and unable to take report at this time.

## 2015-11-23 NOTE — ED Notes (Signed)
Bed: IR44 Expected date:  Expected time:  Means of arrival:  Comments: 81 yo F  Abnormal labs, UTI

## 2015-11-23 NOTE — H&P (Signed)
History and Physical  [name] YHO:[id number] DOB: 05-18-1932 DOA: 11/23/2015  Referring physician: Dr. Benjiman Core, EDP PCP: Rogelia Boga, MD  Outpatient Specialists:  1. Not known.  Chief Complaint: Worsening confusion and decreased oral intake  HPI: [name] is a 81 y.o. female , recent hospitalization 11/13/15-11/19/15 with diagnosis of acute encephalopathy, sepsis secondary to UTI, was discharged to SNF, returns to the [facility] ED on 11/23/15 with complaints of worsening confusion/combativeness and decreased oral intake. She has PMH of ASCVD, CABG, AAA repair, rheumatoid arthritis, chronic low back pain, chronic diastolic CHF, chronic lower extremity venous stasis ulcers, HTN. Patient is confused and not a reliable historian. As per ED staff, patient was spitting at them. As per report, she has been refusing to take her medications, increasingly combative towards staff which has limited her care at the SNF. SNF thereby sent her to the hospital for IV therapy. In the ED, lab work shows sodium 155, potassium 3.3, chloride 99, bicarbonate 38, BUN 47, creatinine 1, WBC 16.3, chest x-ray without acute abnormality and urine microscopy not indicative of UTI. Hospitalist admission was requested.   Review of Systems: All systems reviewed and apart from history of presenting illness, are negative.  Past Medical History  Diagnosis Date  . DEPRESSION 05/21/2007  . FIBROCYSTIC BREAST DISEASE, HX OF 05/21/2007  . HYPERLIPIDEMIA-MIXED 11/26/2008    takes Simvastatin daily  . Irritable bowel syndrome 05/21/2007  . KNEE PAIN 09/16/2007  . LEG EDEMA, BILATERAL 05/21/2009  . OSTEOPENIA 05/21/2007  . URTICARIA, CHRONIC 05/21/2007  . History of aortic aneurysm     resection and grafting of a 5.2-cm ascending aortic arch aneurysm August 2009  . Dyslipidemia   . Vitamin D deficiency   . Urticaria   . Neuropathy (HCC)     left radial  neuropathy with wrist drop  .  Diverticulosis   . Irritable bowel syndrome   . GERD (gastroesophageal reflux disease)   . 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 Vaginal yeast infection     recently told to use monistat, hasn't started as of 01/26/2012  . HYPERTENSION 05/11/2008    takes Metoprolol and HCTZ daily  . Asthma     hx of;as a child  . Seasonal allergies     takes Hydroxyzine prn and Zyrtec 2 times a week  . Peripheral edema     takes Furosemide prn  . Pneumonia     history of;in the 90's  . Weakness     hands and feet  . Sciatica   . OSTEOARTHRITIS 01/20/2008    takes Methotrexate weekly  . DJD (degenerative joint disease)     history of   . Rheumatoid arthritis(714.0)   . Chronic back pain     takes Norco prn  . Bruises easily     takes Plaquenil daily  . CORONARY ATHEROSCLEROSIS, ARTERY BYPASS GRAFT 08/23/2008  . CAD (coronary artery disease)     s/p CABG -  x 1 with LIMA to LAD August 2009 /   . Urinary incontinence   . Bowel incontinence   . History of colon polyps   . Urinary frequency   . Urinary urgency   . Nocturia   . Anemia     takes folic acid bid  . Urinary incontinence     wears depends  . Hereditary and idiopathic peripheral neuropathy 05/17/2015   Past Surgical History  Procedure Laterality Date  .  Appendectomy    . Cesarean section  1968    x 1  . Hernia repair    . Coronary artery bypass graft  August 2009    CABG x 1 with LIMA to LAD /  . Ascending aortic aneurysm repair  August 2009     resection and grafting of a 5.2-cm ascending aortic arch aneurysm  . Joint replacement      2009 & 2010- both knees  . Back surgery      2011- cerv. fusion   . Breast surgery      biopsy x2- R breast  . Tonsillectomy      as a child  . Cardiac catheterization  2009  . Colonoscopy    . Lumbar laminectomy/decompression microdiscectomy Bilateral 07/11/2013    Procedure: LUMBAR LAMINECTOMY/DECOMPRESSION MICRODISCECTOMY LUMBAR FIVE SACRAL ONE;  Surgeon: Mariam Dollar, MD;   Location: [facility];  Service: Neurosurgery;  Laterality: Bilateral;   Social History:  reports that she has never smoked. She has never used smokeless tobacco. She reports that she does not drink alcohol or use illicit drugs.   Allergies  Allergen Reactions  . Amoxicillin     REACTION: unspecified  . Cyclobenzaprine Hcl     REACTION: rash  . Mupirocin     Unknown reaction  . Penicillins Hives  . Tetanus Toxoid Hives    Family History  Problem Relation Age of Onset  . Heart disease    . Coronary artery disease    . Anesthesia problems Neg Hx   . Hypotension Neg Hx   . Malignant hyperthermia Neg Hx   . Pseudochol deficiency Neg Hx   . Heart disease Mother   . Stroke Father   . Cancer Maternal Grandmother   . Stroke Paternal Grandmother     Prior to Admission medications   Medication Sig Start Date End Date Taking? Authorizing Provider  acetaminophen (TYLENOL) 500 MG tablet Take 500 mg by mouth every 6 (six) hours as needed (for pain.).   Yes Historical Provider, MD  aspirin EC 81 MG tablet Take 81 mg by mouth at bedtime.    Yes Historical Provider, MD  cetirizine (ZYRTEC) 10 MG tablet Take 10 mg by mouth daily as needed for allergies.    Yes Historical Provider, MD  cholecalciferol (VITAMIN D) 1000 UNITS tablet Take 1,000 Units by mouth daily.   Yes Historical Provider, MD  ciprofloxacin (CIPRO) 500 MG tablet Take 1 tablet (500 mg total) by mouth 2 (two) times daily. 11/19/15  Yes Dorothea Ogle, MD  DENTA 5000 PLUS 1.1 % CREA dental cream BRUSH 3 TIMES A DAY 10/01/15  Yes Historical Provider, MD  fluconazole (DIFLUCAN) 100 MG tablet Take 100 mg by mouth daily. 11/21/15  Yes Historical Provider, MD  folic acid (FOLVITE) 1 MG tablet Take 1 mg by mouth 2 (two) times daily.   Yes Historical Provider, MD  HYDROcodone-acetaminophen (NORCO/VICODIN) 5-325 MG tablet Take 1 tablet by mouth every 6 (six) hours as needed for moderate pain. 11/19/15  Yes Dorothea Ogle, MD    hydroxychloroquine (PLAQUENIL) 200 MG tablet Take 200 mg by mouth 2 (two) times daily.    Yes Historical Provider, MD  LORazepam (ATIVAN IJ) Inject 0.5 mg as directed every 6 (six) hours as needed (agitation).   Yes Historical Provider, MD  methotrexate (RHEUMATREX) 2.5 MG tablet Take 15 mg by mouth once a week. Caution:Chemotherapy. Protect from light. Take on Thursday.   Yes Historical Provider, MD  metoprolol succinate (  TOPROL-XL) 50 MG 24 hr tablet TAKE 1 TABLET BY MOUTH EVERY DAY 10/16/15  Yes Gordy Savers, MD  NASONEX 50 MCG/ACT nasal spray Place 2 sprays into the nose daily as needed (allergies).  06/22/15  Yes Historical Provider, MD  Pediatric Multivit-Minerals-C (COMPLETE MULTI-VITAMIN PO) Take 1 tablet by mouth daily.   Yes Historical Provider, MD  potassium chloride (KLOR-CON) 8 MEQ tablet Take 8 mEq by mouth 3 (three) times daily.   Yes Historical Provider, MD  senna-docusate (SENOKOT-S) 8.6-50 MG tablet Take 1 tablet by mouth 2 (two) times daily. 11/19/15  Yes Dorothea Ogle, MD  simvastatin (ZOCOR) 40 MG tablet TAKE 1 TABLET BY MOUTH AT BEDTIME 02/22/15  Yes Gordy Savers, MD  furosemide (LASIX) 40 MG tablet Take 0.5 tablets (20 mg total) by mouth 2 (two) times daily. 11/19/15   Dorothea Ogle, MD  hydrochlorothiazide (HYDRODIURIL) 25 MG tablet TAKE 1 TABLET BY MOUTH EVERY MORNING 06/18/15   Gordy Savers, MD   Physical Exam: Filed Vitals:   11/23/15 1004 11/23/15 1030 11/23/15 1100 11/23/15 1154  BP: 125/79 122/55 114/84 172/67  Pulse: 126     Temp:   98.2 F (36.8 C)   TempSrc:   Axillary   Resp: 16 23 18 20   SpO2: 98% 97% 93% 90%   Patient was examined with her female RN and a nursing aide in the room.  General exam: Moderately built and frail elderly female, chronically ill-looking and cachectic, lying comfortably supine on the gurney in no obvious distress. Keeps talking incoherently. Does not follow instructions appropriately. Has a facemask on to prevent  her from spitting at staff.  Head, eyes and ENT: Nontraumatic and normocephalic. Pupils equally reacting to light and accommodation. Oral mucosa is extremely dry, tongue is coated with scabs.  Neck: Supple. No JVD, carotid bruit or thyromegaly.  Lymphatics: No lymphadenopathy.  Respiratory system: Clear to auscultation. No increased work of breathing. Midline sternotomy scar. Right upper chest surgical scar.  Cardiovascular system: S1 and S2 heard, RRR. No JVD, murmurs, gallops, clicks or pedal edema.  Gastrointestinal system: Abdomen is nondistended, soft and nontender. Normal bowel sounds heard. No organomegaly or masses appreciated.  Central nervous system: Alert and oriented only to self. No focal neurological deficits.  Extremities: Symmetric 5 x 5 power-moves all limbs actively. Peripheral pulses symmetrically felt. At least 5 superficial ulcers on bilateral legs of different sizes and mostly clean and one on the right anterior shin with mild slough but no features of cellulitis. Patient has hyperpigmentation across both posterior thighs-not sure that this is from old bruising or birthmark. She has extensive bruising of both upper extremities as well.  Skin: No rashes or acute findings. Stage II sacral decubitus ulcer in the gluteal cleft which is clean.  Musculoskeletal system: Negative exam.  Psychiatry: Pleasant and cooperative.   Labs on Admission:  Basic Metabolic Panel:  Recent Labs Lab 11/17/15 0531 11/18/15 0647 11/19/15 0515 11/23/15 0750  NA 139 141 139 155*  K 4.2 3.1* 2.8* 3.3*  CL 98* 94* 89* 99*  CO2 26 32 36* 38*  GLUCOSE 79 89 114* 104*  BUN 16 16 19  47*  CREATININE 0.70 0.72 0.68 1.00  CALCIUM 8.8* 8.8* 9.2 9.7   Liver Function Tests:  Recent Labs Lab 11/23/15 0750  AST 33  ALT 22  ALKPHOS 94  BILITOT 1.0  PROT 8.0  ALBUMIN 3.9   No results for input(s): LIPASE, AMYLASE in the last 168 hours. No results  for input(s): AMMONIA in the last  168 hours. CBC:  Recent Labs Lab 11/17/15 0531 11/18/15 0647 11/19/15 0515 11/23/15 0750  WBC 6.8 6.1 8.1 16.3*  NEUTROABS  --   --   --  13.5*  HGB 10.2* 10.2* 12.1 13.7  HCT 31.6* 31.5* 38.1 44.5  MCV 85.4 86.8 86.0 90.6  PLT 128* 127* 168 430*   Cardiac Enzymes: No results for input(s): CKTOTAL, CKMB, CKMBINDEX, TROPONINI in the last 168 hours.  BNP (last 3 results) No results for input(s): PROBNP in the last 8760 hours. CBG: No results for input(s): GLUCAP in the last 168 hours.  Radiological Exams on Admission: Dg Chest Portable 1 View  11/23/2015  CLINICAL DATA:  Weakness. EXAM: PORTABLE CHEST 1 VIEW COMPARISON:  November 13, 2015. FINDINGS: Stable cardiomediastinal silhouette. Sternotomy wires are noted. No pneumothorax or pleural effusion is noted. Old right rib fractures are again noted. No acute pulmonary disease is noted. IMPRESSION: No acute cardiopulmonary abnormality seen. Electronically Signed   By: Lupita Raider, M.D.   On: 11/23/2015 09:26    EKG: Independently reviewed. Poor quality EKG. Baseline artifact. Mild sinus tachycardia, PVCs, possible LVH and RSR pattern in V1. QTC 428 ms.  Assessment/Plan Active Problems:   Essential hypertension   CORONARY ATHEROSCLEROSIS, ARTERY BYPASS GRAFT   Rheumatoid arthritis (HCC)   Pressure ulcer   Malnutrition of moderate degree   Acute encephalopathy   Chronic diastolic congestive heart failure (HCC)   Dehydration with hypernatremia   Dehydration with hypernatremia - Secondary to poor oral intake and diuretics. Diuretics were discontinued at the SNF on 12/27. - Hydrate with IV D5 1/2 NS and slowly correct sodium by 8 mEq per 24 hours. - Follow BMP in a.m. Encourage oral intake.  Hypokalemia - Replace and follow  Acute encephalopathy - Likely secondary to dehydration, hypernatremia complicating? Underlying dementia. No focal deficits on exam. No lab workup suggesting infectious source (chest x-ray negative  and urine microscopy not suggestive of UTI) - Hydrate and follow clinically.  Essential hypertension - Continue metoprolol. DC HCTZ indefinitely  Dysphagia - Patient was recently seen by speech therapy on 12/23 and was placed on dysphagia 2 diet and nectar thickened liquids. Some of this may be secondary to dry mouth and tongue coating. She was also started on treatment for oral thrush with Diflucan at SNF-continue same. -Nursing to provide aggressive oral care.  - Speech therapy consulted for dietary recommendations.  Multiple bilateral lower extremity ulcers and stage II sacral decubitus ulcer  - Wound care consulted.   Chronic diastolic CHF - Currently dehydrated. Diuretics on hold  Malnutrition - Dietitian consultation  Rheumatoid arthritis - Continue home medications  CAD  Recently treated UTI - Urine microscopy not impressive for another UTI.   DVT prophylaxis: Lovenox  Code Status:DO NOT RESUSCITATE-has out of facility sheet confirming this. Family Communication: None at bedside   Disposition Plan: DC to SNF when medically stable, possibly in 3-4 days    Time spent: 60 minutes   HONGALGI,ANAND, MD, FACP, FHM. [facility] Pager [phone]  If 7PM-7AM, please contact night-coverage [email or web address] Password [email or web address] 11/23/2015, 2:46 PM

## 2015-11-23 NOTE — Progress Notes (Signed)
OT Cancellation Note  Patient Details Name: [name] MRN: [id number] DOB: September 23, 1932   Cancelled Treatment:    Reason Eval/Treat Not Completed: Fatigue/lethargy limiting ability to participate. Spoke to [employer].  Pt was agitated earlier and is now sleeping soundly. Will try to reattempt OT evaluation over weekend, if schedule permits or Monday.  Camyah Pultz 11/23/2015, 3:07 PM  Marica Otter, OTR/L [phone] 11/23/2015

## 2015-11-23 NOTE — ED Provider Notes (Signed)
CSN: [id number]     Arrival date & time 11/23/15  5176 History   First MD Initiated Contact with Patient 11/23/15 [phone]     Chief Complaint  Patient presents with  . Dehydration  . Urinary Tract Infection    Level 5 caveat due to uncooperativeness. Patient is a 81 y.o. female presenting with urinary tract infection. The history is provided by the patient.  Urinary Tract Infection  patient has been treated for Proteus urinary tract infection with Cipro. Recent admission to hospital. Micah Flesher back to nursing home and has been doing worse. Decreased oral intake. Patient's been more combative. She is awake but overall appropriate but rather unpleasant. She is wearing a mask because she has been spitting. States she will continue to spit [place].   Past Medical History  Diagnosis Date  . DEPRESSION 05/21/2007  . FIBROCYSTIC BREAST DISEASE, HX OF 05/21/2007  . HYPERLIPIDEMIA-MIXED 11/26/2008    takes Simvastatin daily  . Irritable bowel syndrome 05/21/2007  . KNEE PAIN 09/16/2007  . LEG EDEMA, BILATERAL 05/21/2009  . OSTEOPENIA 05/21/2007  . URTICARIA, CHRONIC 05/21/2007  . History of aortic aneurysm     resection and grafting of a 5.2-cm ascending aortic arch aneurysm August 2009  . Dyslipidemia   . Vitamin D deficiency   . Urticaria   . Neuropathy (HCC)     left radial  neuropathy with wrist drop  . Diverticulosis   . Irritable bowel syndrome   . GERD (gastroesophageal reflux disease)   . 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 Vaginal yeast infection     recently told to use monistat, hasn't started as of 01/26/2012  . HYPERTENSION 05/11/2008    takes Metoprolol and HCTZ daily  . Asthma     hx of;as a child  . Seasonal allergies     takes Hydroxyzine prn and Zyrtec 2 times a week  . Peripheral edema     takes Furosemide prn  . Pneumonia     history of;in the 90's  . Weakness     hands and feet  . Sciatica   . OSTEOARTHRITIS 01/20/2008    takes Methotrexate weekly  . DJD  (degenerative joint disease)     history of   . Rheumatoid arthritis(714.0)   . Chronic back pain     takes Norco prn  . Bruises easily     takes Plaquenil daily  . CORONARY ATHEROSCLEROSIS, ARTERY BYPASS GRAFT 08/23/2008  . CAD (coronary artery disease)     s/p CABG -  x 1 with LIMA to LAD August 2009 /   . Urinary incontinence   . Bowel incontinence   . History of colon polyps   . Urinary frequency   . Urinary urgency   . Nocturia   . Anemia     takes folic acid bid  . Urinary incontinence     wears depends  . Hereditary and idiopathic peripheral neuropathy 05/17/2015   Past Surgical History  Procedure Laterality Date  . Appendectomy    . Cesarean section  1968    x 1  . Hernia repair    . Coronary artery bypass graft  August 2009    CABG x 1 with LIMA to LAD /  . Ascending aortic aneurysm repair  August 2009     resection and grafting of a 5.2-cm ascending aortic arch aneurysm  . Joint replacement      2009 & 2010- both knees  . Back  surgery      2011- cerv. fusion   . Breast surgery      biopsy x2- R breast  . Tonsillectomy      as a child  . Cardiac catheterization  2009  . Colonoscopy    . Lumbar laminectomy/decompression microdiscectomy Bilateral 07/11/2013    Procedure: LUMBAR LAMINECTOMY/DECOMPRESSION MICRODISCECTOMY LUMBAR FIVE SACRAL ONE;  Surgeon: Mariam Dollar, MD;  Location: [facility];  Service: Neurosurgery;  Laterality: Bilateral;   Family History  Problem Relation Age of Onset  . Heart disease    . Coronary artery disease    . Anesthesia problems Neg Hx   . Hypotension Neg Hx   . Malignant hyperthermia Neg Hx   . Pseudochol deficiency Neg Hx   . Heart disease Mother   . Stroke Father   . Cancer Maternal Grandmother   . Stroke Paternal Grandmother    Social History  Substance Use Topics  . Smoking status: Never Smoker   . Smokeless tobacco: Never Used  . Alcohol Use: No   OB History    No data available     Review of Systems  Unable  to perform ROS: Acuity of condition      Allergies  Amoxicillin; Cyclobenzaprine hcl; Mupirocin; Penicillins; and Tetanus toxoid  Home Medications   Prior to Admission medications   Medication Sig Start Date End Date Taking? Authorizing Provider  acetaminophen (TYLENOL) 500 MG tablet Take 500 mg by mouth every 6 (six) hours as needed (for pain.).   Yes Historical Provider, MD  aspirin EC 81 MG tablet Take 81 mg by mouth at bedtime.    Yes Historical Provider, MD  cetirizine (ZYRTEC) 10 MG tablet Take 10 mg by mouth daily as needed for allergies.    Yes Historical Provider, MD  cholecalciferol (VITAMIN D) 1000 UNITS tablet Take 1,000 Units by mouth daily.   Yes Historical Provider, MD  ciprofloxacin (CIPRO) 500 MG tablet Take 1 tablet (500 mg total) by mouth 2 (two) times daily. 11/19/15  Yes Dorothea Ogle, MD  DENTA 5000 PLUS 1.1 % CREA dental cream BRUSH 3 TIMES A DAY 10/01/15  Yes Historical Provider, MD  fluconazole (DIFLUCAN) 100 MG tablet Take 100 mg by mouth daily. 11/21/15  Yes Historical Provider, MD  folic acid (FOLVITE) 1 MG tablet Take 1 mg by mouth 2 (two) times daily.   Yes Historical Provider, MD  HYDROcodone-acetaminophen (NORCO/VICODIN) 5-325 MG tablet Take 1 tablet by mouth every 6 (six) hours as needed for moderate pain. 11/19/15  Yes Dorothea Ogle, MD  hydroxychloroquine (PLAQUENIL) 200 MG tablet Take 200 mg by mouth 2 (two) times daily.    Yes Historical Provider, MD  LORazepam (ATIVAN IJ) Inject 0.5 mg as directed every 6 (six) hours as needed (agitation).   Yes Historical Provider, MD  methotrexate (RHEUMATREX) 2.5 MG tablet Take 15 mg by mouth once a week. Caution:Chemotherapy. Protect from light. Take on Thursday.   Yes Historical Provider, MD  metoprolol succinate (TOPROL-XL) 50 MG 24 hr tablet TAKE 1 TABLET BY MOUTH EVERY DAY 10/16/15  Yes Gordy Savers, MD  NASONEX 50 MCG/ACT nasal spray Place 2 sprays into the nose daily as needed (allergies).  06/22/15  Yes  Historical Provider, MD  Pediatric Multivit-Minerals-C (COMPLETE MULTI-VITAMIN PO) Take 1 tablet by mouth daily.   Yes Historical Provider, MD  potassium chloride (KLOR-CON) 8 MEQ tablet Take 8 mEq by mouth 3 (three) times daily.   Yes Historical Provider, MD  senna-docusate (SENOKOT-S)  8.6-50 MG tablet Take 1 tablet by mouth 2 (two) times daily. 11/19/15  Yes Dorothea Ogle, MD  simvastatin (ZOCOR) 40 MG tablet TAKE 1 TABLET BY MOUTH AT BEDTIME 02/22/15  Yes Gordy Savers, MD  furosemide (LASIX) 40 MG tablet Take 0.5 tablets (20 mg total) by mouth 2 (two) times daily. 11/19/15   Dorothea Ogle, MD  hydrochlorothiazide (HYDRODIURIL) 25 MG tablet TAKE 1 TABLET BY MOUTH EVERY MORNING 06/18/15   Gordy Savers, MD   BP 172/67 mmHg  Pulse 126  Temp(Src) 98.2 F (36.8 C) (Axillary)  Resp 20  SpO2 90% Physical Exam  Constitutional: She appears well-developed.  HENT:  Mucous membranes are dry. Tongue is cracked, lips are cracked.  Eyes: Pupils are equal, round, and reactive to light.  Cardiovascular:  Mild tachycardia  Pulmonary/Chest: Effort normal. She has no wheezes. She has no rales.  Abdominal: Soft. There is no tenderness.  Musculoskeletal: She exhibits edema.  Neurological: She is alert.  Skin: Skin is warm.  Few skin ulcers to bilateral lower legs with chronic venous changes.  Psychiatric:  Patient is awake but uncooperative.    ED Course  Procedures (including critical care time) Labs Review Labs Reviewed  COMPREHENSIVE METABOLIC PANEL - Abnormal; Notable for the following:    Sodium 155 (*)    Potassium 3.3 (*)    Chloride 99 (*)    CO2 38 (*)    Glucose, Bld 104 (*)    BUN 47 (*)    GFR calc non Af Amer 51 (*)    GFR calc Af Amer 60 (*)    Anion gap 18 (*)    All other components within normal limits  URINALYSIS, ROUTINE W REFLEX MICROSCOPIC (NOT AT [facility]) - Abnormal; Notable for the following:    Bilirubin Urine SMALL (*)    All other components within  normal limits  CBC WITH DIFFERENTIAL/PLATELET - Abnormal; Notable for the following:    WBC 16.3 (*)    RDW 17.2 (*)    Platelets 430 (*)    Neutro Abs 13.5 (*)    All other components within normal limits    Imaging Review Dg Chest Portable 1 View  11/23/2015  CLINICAL DATA:  Weakness. EXAM: PORTABLE CHEST 1 VIEW COMPARISON:  November 13, 2015. FINDINGS: Stable cardiomediastinal silhouette. Sternotomy wires are noted. No pneumothorax or pleural effusion is noted. Old right rib fractures are again noted. No acute pulmonary disease is noted. IMPRESSION: No acute cardiopulmonary abnormality seen. Electronically Signed   By: Lupita Raider, M.D.   On: 11/23/2015 09:26   I have personally reviewed and evaluated these images and lab results as part of my medical decision-making.   EKG Interpretation   Date/Time:  Friday November 23 2015 08:10:23 EST Ventricular Rate:  113 PR Interval:  184 QRS Duration: 110 QT Interval:  312 QTC Calculation: 428 R Axis:   -30 Text Interpretation:  Sinus tachycardia Paired ventricular premature  complexes RSR' in V1 or V2, probably normal variant LVH with secondary  repolarization abnormality PVCs with artifact. Confirmed by Rubin Payor  MD,  Harrold Donath [phone]) on 11/23/2015 8:50:05 AM      MDM   Final diagnoses:  Dehydration    Patient with dehydration. Has been treated for urinary tract infection also. Has been doing worse at nursing home. Will admit to internal medicine.    Benjiman Core, MD 11/23/15 [phone]

## 2015-11-23 NOTE — Care Management Note (Signed)
Case Management Note  Patient Details  Name: KALKIDAN CAUDELL MRN: [id number] Date of Birth: 1932-03-08  Subjective/Objective:  81 y/o f admitted from home. PT cons.                  Action/Plan:d/c plan home.   Expected Discharge Date:                  Expected Discharge Plan:  Home w Home Health Services  In-House Referral:     Discharge planning Services  CM Consult  Post Acute Care Choice:    Choice offered to:     DME Arranged:    DME Agency:     HH Arranged:    HH Agency:     Status of Service:  In process, will continue to follow  Medicare Important Message Given:    Date Medicare IM Given:    Medicare IM give by:    Date Additional Medicare IM Given:    Additional Medicare Important Message give by:     If discussed at Long Length of Stay Meetings, dates discussed:    Additional Comments:  Lanier Clam, RN 11/23/2015, 2:11 PM

## 2015-11-24 DIAGNOSIS — Z880 Allergy status to penicillin: Secondary | ICD-10-CM | POA: Diagnosis not present

## 2015-11-24 DIAGNOSIS — G8929 Other chronic pain: Secondary | ICD-10-CM | POA: Diagnosis present

## 2015-11-24 DIAGNOSIS — E440 Moderate protein-calorie malnutrition: Secondary | ICD-10-CM | POA: Diagnosis present

## 2015-11-24 DIAGNOSIS — E860 Dehydration: Secondary | ICD-10-CM | POA: Diagnosis present

## 2015-11-24 DIAGNOSIS — R680 Hypothermia, not associated with low environmental temperature: Secondary | ICD-10-CM | POA: Diagnosis present

## 2015-11-24 DIAGNOSIS — R32 Unspecified urinary incontinence: Secondary | ICD-10-CM | POA: Diagnosis present

## 2015-11-24 DIAGNOSIS — Z66 Do not resuscitate: Secondary | ICD-10-CM | POA: Diagnosis present

## 2015-11-24 DIAGNOSIS — B370 Candidal stomatitis: Secondary | ICD-10-CM | POA: Diagnosis present

## 2015-11-24 DIAGNOSIS — L97219 Non-pressure chronic ulcer of right calf with unspecified severity: Secondary | ICD-10-CM | POA: Diagnosis present

## 2015-11-24 DIAGNOSIS — R627 Adult failure to thrive: Secondary | ICD-10-CM | POA: Diagnosis present

## 2015-11-24 DIAGNOSIS — R1314 Dysphagia, pharyngoesophageal phase: Secondary | ICD-10-CM | POA: Diagnosis not present

## 2015-11-24 DIAGNOSIS — K219 Gastro-esophageal reflux disease without esophagitis: Secondary | ICD-10-CM | POA: Diagnosis present

## 2015-11-24 DIAGNOSIS — E876 Hypokalemia: Secondary | ICD-10-CM | POA: Diagnosis present

## 2015-11-24 DIAGNOSIS — Z9114 Patient's other noncompliance with medication regimen: Secondary | ICD-10-CM | POA: Diagnosis not present

## 2015-11-24 DIAGNOSIS — Z79899 Other long term (current) drug therapy: Secondary | ICD-10-CM | POA: Diagnosis not present

## 2015-11-24 DIAGNOSIS — J45909 Unspecified asthma, uncomplicated: Secondary | ICD-10-CM | POA: Diagnosis present

## 2015-11-24 DIAGNOSIS — R4189 Other symptoms and signs involving cognitive functions and awareness: Secondary | ICD-10-CM | POA: Diagnosis not present

## 2015-11-24 DIAGNOSIS — I872 Venous insufficiency (chronic) (peripheral): Secondary | ICD-10-CM | POA: Diagnosis present

## 2015-11-24 DIAGNOSIS — Z8249 Family history of ischemic heart disease and other diseases of the circulatory system: Secondary | ICD-10-CM | POA: Diagnosis not present

## 2015-11-24 DIAGNOSIS — I5032 Chronic diastolic (congestive) heart failure: Secondary | ICD-10-CM | POA: Diagnosis present

## 2015-11-24 DIAGNOSIS — E785 Hyperlipidemia, unspecified: Secondary | ICD-10-CM | POA: Diagnosis present

## 2015-11-24 DIAGNOSIS — Z515 Encounter for palliative care: Secondary | ICD-10-CM | POA: Diagnosis present

## 2015-11-24 DIAGNOSIS — F0391 Unspecified dementia with behavioral disturbance: Secondary | ICD-10-CM | POA: Diagnosis present

## 2015-11-24 DIAGNOSIS — I10 Essential (primary) hypertension: Secondary | ICD-10-CM | POA: Diagnosis not present

## 2015-11-24 DIAGNOSIS — I2510 Atherosclerotic heart disease of native coronary artery without angina pectoris: Secondary | ICD-10-CM | POA: Diagnosis present

## 2015-11-24 DIAGNOSIS — Z823 Family history of stroke: Secondary | ICD-10-CM | POA: Diagnosis not present

## 2015-11-24 DIAGNOSIS — M069 Rheumatoid arthritis, unspecified: Secondary | ICD-10-CM | POA: Diagnosis present

## 2015-11-24 DIAGNOSIS — Z9119 Patient's noncompliance with other medical treatment and regimen: Secondary | ICD-10-CM | POA: Diagnosis not present

## 2015-11-24 DIAGNOSIS — R4702 Dysphasia: Secondary | ICD-10-CM | POA: Diagnosis present

## 2015-11-24 DIAGNOSIS — E870 Hyperosmolality and hypernatremia: Secondary | ICD-10-CM | POA: Diagnosis present

## 2015-11-24 DIAGNOSIS — I878 Other specified disorders of veins: Secondary | ICD-10-CM | POA: Diagnosis present

## 2015-11-24 DIAGNOSIS — Z7982 Long term (current) use of aspirin: Secondary | ICD-10-CM | POA: Diagnosis not present

## 2015-11-24 DIAGNOSIS — Z951 Presence of aortocoronary bypass graft: Secondary | ICD-10-CM | POA: Diagnosis not present

## 2015-11-24 DIAGNOSIS — Z9181 History of falling: Secondary | ICD-10-CM | POA: Diagnosis not present

## 2015-11-24 DIAGNOSIS — F22 Delusional disorders: Secondary | ICD-10-CM | POA: Diagnosis not present

## 2015-11-24 DIAGNOSIS — F39 Unspecified mood [affective] disorder: Secondary | ICD-10-CM | POA: Diagnosis not present

## 2015-11-24 DIAGNOSIS — M545 Low back pain: Secondary | ICD-10-CM | POA: Diagnosis present

## 2015-11-24 DIAGNOSIS — G629 Polyneuropathy, unspecified: Secondary | ICD-10-CM | POA: Diagnosis present

## 2015-11-24 DIAGNOSIS — R1310 Dysphagia, unspecified: Secondary | ICD-10-CM | POA: Diagnosis present

## 2015-11-24 DIAGNOSIS — F05 Delirium due to known physiological condition: Secondary | ICD-10-CM | POA: Diagnosis present

## 2015-11-24 DIAGNOSIS — F0281 Dementia in other diseases classified elsewhere with behavioral disturbance: Secondary | ICD-10-CM | POA: Diagnosis present

## 2015-11-24 DIAGNOSIS — L89152 Pressure ulcer of sacral region, stage 2: Secondary | ICD-10-CM | POA: Diagnosis present

## 2015-11-24 DIAGNOSIS — L97229 Non-pressure chronic ulcer of left calf with unspecified severity: Secondary | ICD-10-CM | POA: Diagnosis present

## 2015-11-24 DIAGNOSIS — Z7189 Other specified counseling: Secondary | ICD-10-CM | POA: Diagnosis not present

## 2015-11-24 DIAGNOSIS — G9340 Encephalopathy, unspecified: Secondary | ICD-10-CM | POA: Diagnosis present

## 2015-11-24 DIAGNOSIS — J9601 Acute respiratory failure with hypoxia: Secondary | ICD-10-CM | POA: Diagnosis not present

## 2015-11-24 DIAGNOSIS — D6489 Other specified anemias: Secondary | ICD-10-CM | POA: Diagnosis present

## 2015-11-24 DIAGNOSIS — I25708 Atherosclerosis of coronary artery bypass graft(s), unspecified, with other forms of angina pectoris: Secondary | ICD-10-CM | POA: Diagnosis not present

## 2015-11-24 DIAGNOSIS — I110 Hypertensive heart disease with heart failure: Secondary | ICD-10-CM | POA: Diagnosis present

## 2015-11-24 LAB — BASIC METABOLIC PANEL
ANION GAP: 9 (ref 5–15)
BUN: 27 mg/dL — ABNORMAL HIGH (ref 6–20)
CALCIUM: 8.7 mg/dL — AB (ref 8.9–10.3)
CO2: 37 mmol/L — ABNORMAL HIGH (ref 22–32)
Chloride: 105 mmol/L (ref 101–111)
Creatinine, Ser: 0.62 mg/dL (ref 0.44–1.00)
Glucose, Bld: 119 mg/dL — ABNORMAL HIGH (ref 65–99)
POTASSIUM: 2.9 mmol/L — AB (ref 3.5–5.1)
Sodium: 151 mmol/L — ABNORMAL HIGH (ref 135–145)

## 2015-11-24 LAB — MAGNESIUM: MAGNESIUM: 2.5 mg/dL — AB (ref 1.7–2.4)

## 2015-11-24 LAB — CBC
HCT: 36.9 % (ref 36.0–46.0)
Hemoglobin: 11.1 g/dL — ABNORMAL LOW (ref 12.0–15.0)
MCH: 27.5 pg (ref 26.0–34.0)
MCHC: 30.1 g/dL (ref 30.0–36.0)
MCV: 91.3 fL (ref 78.0–100.0)
PLATELETS: 341 10*3/uL (ref 150–400)
RBC: 4.04 MIL/uL (ref 3.87–5.11)
RDW: 16.9 % — AB (ref 11.5–15.5)
WBC: 11.2 10*3/uL — AB (ref 4.0–10.5)

## 2015-11-24 MED ORDER — POTASSIUM CHLORIDE 10 MEQ/100ML IV SOLN
10.0000 meq | INTRAVENOUS | Status: AC
Start: 2015-11-24 — End: 2015-11-24
  Administered 2015-11-24 (×5): 10 meq via INTRAVENOUS
  Filled 2015-11-24 (×6): qty 100

## 2015-11-24 MED ORDER — PRO-STAT SUGAR FREE PO LIQD
30.0000 mL | Freq: Three times a day (TID) | ORAL | Status: DC
  Filled 2015-11-24 (×17): qty 30

## 2015-11-24 MED ORDER — ADULT MULTIVITAMIN LIQUID CH
5.0000 mL | Freq: Every day | ORAL | Status: DC
  Filled 2015-11-24 (×17): qty 5

## 2015-11-24 MED ORDER — DEXTROSE 5 % IV SOLN
INTRAVENOUS | Status: DC
  Administered 2015-11-24 – 2015-11-25 (×2): via INTRAVENOUS

## 2015-11-24 MED ORDER — HALOPERIDOL LACTATE 5 MG/ML IJ SOLN
1.0000 mg | Freq: Four times a day (QID) | INTRAMUSCULAR | Status: DC | PRN
Start: 2015-11-24 — End: 2015-11-27
  Administered 2015-11-24 – 2015-11-27 (×4): 1 mg via INTRAVENOUS
  Filled 2015-11-24 (×4): qty 1

## 2015-11-24 MED ORDER — HALOPERIDOL LACTATE 5 MG/ML IJ SOLN
1.0000 mg | Freq: Two times a day (BID) | INTRAMUSCULAR | Status: DC | PRN
Start: 2015-11-24 — End: 2015-11-24
  Administered 2015-11-24: 1 mg via INTRAVENOUS
  Filled 2015-11-24: qty 1

## 2015-11-24 MED ORDER — ENSURE ENLIVE PO LIQD: 237.0000 mL | ORAL | Status: DC

## 2015-11-24 NOTE — Evaluation (Signed)
Clinical/Bedside Swallow Evaluation Patient Details  Name: [name] MRN: [id number] Date of Birth: Jun 19, 1932  Today's Date: 11/24/2015 Time: SLP Start Time (ACUTE ONLY): 1443 SLP Stop Time (ACUTE ONLY): 1500 SLP Time Calculation (min) (ACUTE ONLY): 17 min  Past Medical History:  Past Medical History  Diagnosis Date  . DEPRESSION 05/21/2007  . FIBROCYSTIC BREAST DISEASE, HX OF 05/21/2007  . HYPERLIPIDEMIA-MIXED 11/26/2008    takes Simvastatin daily  . Irritable bowel syndrome 05/21/2007  . KNEE PAIN 09/16/2007  . LEG EDEMA, BILATERAL 05/21/2009  . OSTEOPENIA 05/21/2007  . URTICARIA, CHRONIC 05/21/2007  . History of aortic aneurysm     resection and grafting of a 5.2-cm ascending aortic arch aneurysm August 2009  . Dyslipidemia   . Vitamin D deficiency   . Urticaria   . Neuropathy (HCC)     left radial  neuropathy with wrist drop  . Diverticulosis   . Irritable bowel syndrome   . GERD (gastroesophageal reflux disease)   . 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 Vaginal yeast infection     recently told to use monistat, hasn't started as of 01/26/2012  . HYPERTENSION 05/11/2008    takes Metoprolol and HCTZ daily  . Asthma     hx of;as a child  . Seasonal allergies     takes Hydroxyzine prn and Zyrtec 2 times a week  . Peripheral edema     takes Furosemide prn  . Pneumonia     history of;in the 90's  . Weakness     hands and feet  . Sciatica   . OSTEOARTHRITIS 01/20/2008    takes Methotrexate weekly  . DJD (degenerative joint disease)     history of   . Rheumatoid arthritis(714.0)   . Chronic back pain     takes Norco prn  . Bruises easily     takes Plaquenil daily  . CORONARY ATHEROSCLEROSIS, ARTERY BYPASS GRAFT 08/23/2008  . CAD (coronary artery disease)     s/p CABG -  x 1 with LIMA to LAD August 2009 /   . Urinary incontinence   . Bowel incontinence   . History of colon polyps   . Urinary frequency   . Urinary urgency   . Nocturia   . Anemia      takes folic acid bid  . Urinary incontinence     wears depends  . Hereditary and idiopathic peripheral neuropathy 05/17/2015   Past Surgical History:  Past Surgical History  Procedure Laterality Date  . Appendectomy    . Cesarean section  1968    x 1  . Hernia repair    . Coronary artery bypass graft  August 2009    CABG x 1 with LIMA to LAD /  . Ascending aortic aneurysm repair  August 2009     resection and grafting of a 5.2-cm ascending aortic arch aneurysm  . Joint replacement      2009 & 2010- both knees  . Back surgery      2011- cerv. fusion   . Breast surgery      biopsy x2- R breast  . Tonsillectomy      as a child  . Cardiac catheterization  2009  . Colonoscopy    . Lumbar laminectomy/decompression microdiscectomy Bilateral 07/11/2013    Procedure: LUMBAR LAMINECTOMY/DECOMPRESSION MICRODISCECTOMY LUMBAR FIVE SACRAL ONE;  Surgeon: Mariam Dollar, MD;  Location: [facility];  Service: Neurosurgery;  Laterality: Bilateral;   HPI:  81 yo female, recent hospitalization 11/13/15-11/19/15 with diagnosis of acute encephalopathy, sepsis secondary to UTI, was discharged to SNF, admitted with dehydration, hypernatremia. Hx of RA, CABG, GERD, PNA (1990s),chronic low back pain.Swallow evaluated last admission 12/23 and patient noted to have oropharyngeal dysphagia impacted by alertness and cognitive status, placed on dysphagia 2 diet with nectar thick liquid. Acute CXR negative.    Assessment / Plan / Recommendation Clinical Impression  Patient continues to exhibit an oropharyngeal dysphagia impacted primarily by significantly altered mentation. Patient combative, yelling, and with poor awareness. Suspect signs of airway compromise with po intake due to the aformentioned status. Signs greatest with thin liquids with both solids and nectar thick liquids resulting in inconsistent wet vocal quality which clears with spontaneous throat clear. Suspect that swallowing function will improve with  improved mentation. Downgraded texture to Dys 1 (puree) with nectar thick liquids, crush meds. Will follow up.     Aspiration Risk  Moderate aspiration risk    Diet Recommendation Dysphagia 1 (Puree);Nectar-thick liquid   Liquid Administration via: Cup;Straw Medication Administration: Crushed with puree Supervision: Staff to assist with self feeding;Full supervision/cueing for compensatory strategies Compensations: Slow rate;Small sips/bites Postural Changes: Seated upright at 90 degrees    Other  Recommendations Oral Care Recommendations: Oral care BID Other Recommendations: Order thickener from pharmacy;Prohibited food (jello, ice cream, thin soups);Remove water pitcher   Follow up Recommendations  Skilled Nursing facility    Frequency and Duration min 2x/week  2 weeks       Prognosis Prognosis for Safe Diet Advancement: Fair Barriers to Reach Goals: Cognitive deficits      Swallow Study   General HPI: 81 yo female, recent hospitalization 11/13/15-11/19/15 with diagnosis of acute encephalopathy, sepsis secondary to UTI, was discharged to SNF, admitted with dehydration, hypernatremia. Hx of RA, CABG, GERD, PNA (1990s),chronic low back pain.Swallow evaluated last admission 12/23 and patient noted to have oropharyngeal dysphagia impacted by alertness and cognitive status, placed on dysphagia 2 diet with nectar thick liquid. Acute CXR negative.  Type of Study: Bedside Swallow Evaluation Previous Swallow Assessment: see HPI Diet Prior to this Study: Dysphagia 2 (chopped);Nectar-thick liquids Temperature Spikes Noted: No Respiratory Status: Room air History of Recent Intubation: No Behavior/Cognition: Alert;Confused;Agitated;Uncooperative;Distractible;Doesn't follow directions Oral Cavity Assessment: Within Functional Limits Oral Care Completed by SLP: No Oral Cavity - Dentition: Adequate natural dentition Vision: Functional for self-feeding Self-Feeding Abilities: Needs  assist Patient Positioning: Upright in bed Baseline Vocal Quality: Normal Volitional Cough: Cognitively unable to elicit Volitional Swallow: Unable to elicit    Oral/Motor/Sensory Function Overall Oral Motor/Sensory Function:  (unable to formally assess due to AMS)   Ice Chips Ice chips: Not tested   Thin Liquid Thin Liquid: Impaired Presentation: Straw;Cup Oral Phase Impairments: Reduced labial seal Pharyngeal  Phase Impairments: Suspected delayed Swallow;Cough - Delayed    Nectar Thick Nectar Thick Liquid: Impaired Presentation: Straw;Self Fed Pharyngeal Phase Impairments: Wet Vocal Quality   Honey Thick Honey Thick Liquid: Not tested   Puree Puree: Impaired Presentation: Spoon;Self Fed Pharyngeal Phase Impairments: Multiple swallows   Solid   GO   Mujahid Jalomo MA, CCC-SLP [phone]  Solid: Not tested       Keishawn Rajewski Meryl 11/24/2015,3:06 PM

## 2015-11-24 NOTE — Evaluation (Signed)
Physical Therapy Evaluation Patient Details Name: TAYGAN CONNELL MRN: [id number] DOB: 1931-12-18 Today's Date: 11/24/2015   History of Present Illness  81 yo female admitted with dehydration, hypernatremia. Hx of RA, CABG, chronic low back pain.  Clinical Impression  Bed level eval only due to lethargy (likely due to meds given this am). Pt able to move LEs with cues and assist from therapist. Rolled to L and R with use of bed pads to assist. No family present during session. Recommend return to SNF.     Follow Up Recommendations SNF;Supervision/Assistance - 24 hour    Equipment Recommendations  None recommended by PT    Recommendations for Other Services OT consult     Precautions / Restrictions Precautions Precautions: Fall Precaution Comments: combative/spitting per chart notes Restrictions Weight Bearing Restrictions: No      Mobility  Bed Mobility Overal bed mobility: Needs Assistance Bed Mobility: Rolling Rolling: Total assist;+2 for physical assistance;+2 for safety/equipment         General bed mobility comments: Total +2 for rolling L and R-increased difficulty rolling to R compared to L.   Transfers                    Ambulation/Gait                Stairs            Wheelchair Mobility    Modified Rankin (Stroke Patients Only)       Balance                                             Pertinent Vitals/Pain Faces Pain Scale: No hurt    Home Living Family/patient expects to be discharged to:: Skilled nursing facility                      Prior Function Level of Independence: Needs assistance   Gait / Transfers Assistance Needed: +2 assist for mobility (bed, transfers) during previous hospital stay           Hand Dominance        Extremity/Trunk Assessment   Upper Extremity Assessment: Difficult to assess due to impaired cognition           Lower Extremity Assessment: Difficult  to assess due to impaired cognition         Communication   Communication: No difficulties  Cognition Arousal/Alertness: Lethargic;Suspect due to medications                          General Comments      Exercises        Assessment/Plan    PT Assessment Patient needs continued PT services  PT Diagnosis Difficulty walking;Generalized weakness;Altered mental status   PT Problem List Decreased strength;Decreased activity tolerance;Decreased balance;Decreased mobility;Decreased cognition;Decreased knowledge of use of DME;Decreased skin integrity  PT Treatment Interventions Functional mobility training;Therapeutic activities;DME instruction;Patient/family education;Therapeutic exercise;Balance training   PT Goals (Current goals can be found in the Care Plan section) Acute Rehab PT Goals Patient Stated Goal: none stated PT Goal Formulation: Patient unable to participate in goal setting Time For Goal Achievement: 12/08/15 Potential to Achieve Goals: Fair    Frequency Min 2X/week   Barriers to discharge        Co-evaluation  End of Session   Activity Tolerance: Patient limited by lethargy Patient left: in bed;with call bell/phone within reach;with bed alarm set           Time: 5885-0277 PT Time Calculation (min) (ACUTE ONLY): 8 min   Charges:   PT Evaluation $Initial PT Evaluation Tier I: 1 Procedure     PT G Codes:        Rebeca Alert, MPT Pager: [phone]

## 2015-11-24 NOTE — Progress Notes (Signed)
Utilization Review Completed.[name] T12/31/2016  

## 2015-11-24 NOTE — Progress Notes (Signed)
Initial Nutrition Assessment   INTERVENTION:   -Provide Ensure Enlive po daily, each supplement provides 350 kcal and 20 grams of protein -Provide Prostat liquid protein PO 30 ml TID with meals, each supplement provides 100 kcal, 15 grams protein. -Multivitamin with minerals daily -RD to continue to monitor  NUTRITION DIAGNOSIS:   Increased nutrient needs related to wound healing as evidenced by estimated needs.  GOAL:   Patient will meet greater than or equal to 90% of their needs  MONITOR:   PO intake, Supplement acceptance, Labs, Weight trends, Skin, I & O's  REASON FOR ASSESSMENT:   Consult Assessment of nutrition requirement/status  ASSESSMENT:   82 y.o. female , recent hospitalization 11/13/15-11/19/15 with diagnosis of acute encephalopathy, sepsis secondary to UTI, was discharged to SNF, returns to the [facility] ED on 11/23/15 with complaints of worsening confusion/combativeness and decreased oral intake. She has PMH of ASCVD, CABG, AAA repair, rheumatoid arthritis, chronic low back pain, chronic diastolic CHF, chronic lower extremity venous stasis ulcers, HTN. Patient is confused and not a reliable historian. As per ED staff, patient was spitting at them. As per report, she has been refusing to take her medications, increasingly combative towards staff which has limited her care at the SNF.   Pt assessed during previous admission (12/26). Pt was diagnosed with moderate malnutrition.  Pt currently confused/agitated/combative. Per H&P, not a reliable historian and refusing medications.  Pt with multiple wounds. RD to order supplements to provide extra protein. Pt has lost 19 lb since 12/05 (14% weight loss x 1 month, significant for time frame). Unable to perform nutrition focused physical exam. Suspect malnutrition present given continued weight loss and mental status.  Labs reviewed: Elevated Na, BUN, Mg Low K  Diet Order:  DIET DYS 2 Room service  appropriate?: Yes; Fluid consistency:: Nectar Thick  Skin:  Wound (see comment) (multiple leg wounds, stage II sacral and coccyx ulcers)  Last BM:   PTA  Height:   Ht Readings from Last 1 Encounters:  11/13/15 5\' 2"  (1.575 m)    Weight:   Wt Readings from Last 1 Encounters:  11/19/15 115 lb 11.9 oz (52.5 kg)    Ideal Body Weight:  50 kg  BMI:  There is no weight on file to calculate BMI.  Estimated Nutritional Needs:   Kcal:  1700-1900  Protein:  80-90g  Fluid:  1.9L/day  EDUCATION NEEDS:   No education needs identified at this time  11/21/15, MS, RD, LDN Pager: [phone] After Hours Pager: [phone]

## 2015-11-24 NOTE — Progress Notes (Signed)
PROGRESS NOTE    [name] GDJ:[id number] DOB: 1932-11-10 DOA: 11/23/2015 PCP: Rogelia Boga, MD  HPI/Brief narrative 81 y.o. female , recent hospitalization 11/13/15-11/19/15 with diagnosis of acute encephalopathy, sepsis secondary to UTI, was discharged to SNF, returns to the [facility] ED on 11/23/15 with complaints of worsening confusion/combativeness and decreased oral intake. She has PMH of ASCVD, CABG, AAA repair, rheumatoid arthritis, chronic low back pain, chronic diastolic CHF, chronic lower extremity venous stasis ulcers, HTN.In the ED, lab work shows sodium 155, potassium 3.3, chloride 99, bicarbonate 38, BUN 47, creatinine 1, WBC 16.3, chest x-ray without acute abnormality and urine microscopy not indicative of UTI. Hospitalist admission was requested.      Assessment/Plan:  Dehydration with hypernatremia - Secondary to poor oral intake and diuretics. Diuretics were discontinued at the SNF on 12/27. - Hydrate with hypotonic IV fluids and slowly correct sodium by 8 mEq per 24 hours. - Follow BMP in a.m. Encourage oral intake. - Slowly improving.   Hypokalemia - Replace IV and follow in a.m. Magnesium: 2.5.  Acute encephalopathy - Likely secondary to dehydration, hypernatremia complicating? Underlying dementia. No focal deficits on exam. No lab workup suggesting infectious source (chest x-ray negative and urine microscopy not suggestive of UTI) - Hydrate and follow clinically. - Recent head CT 11/13/15: No acute intracranial abnormalities. - Patient combative, kicking, screaming, spitting at staff limiting care. Started when necessary low-dose IV Haldol after ensuring QTC normal. Monitor.  Essential hypertension - Continue metoprolol. DC HCTZ indefinitely  Dysphagia - Patient was recently seen by speech therapy on 12/23 and was placed on dysphagia 2 diet and nectar thickened liquids. Some of this may be secondary to dry mouth and tongue  coating. She was also started on treatment for oral thrush with Diflucan at SNF-continue same. -Nursing to provide aggressive oral care.  - Speech therapy recommended dysphagia 1 diet and nectar thickened liquids.  Multiple bilateral lower extremity ulcers and stage II sacral decubitus ulcer  - Wound care consulted.   Chronic diastolic CHF - Currently dehydrated. Diuretics on hold  Malnutrition - Dietitian consultation  Rheumatoid arthritis - Continue home medications  CAD  Recently treated UTI - Urine microscopy not impressive for another UTI.  Anemia - Dilutional. Follow CBCs   DVT prophylaxis: Lovenox  Code Status:DO NOT RESUSCITATE-has out of facility sheet confirming this. Family Communication: None at bedside  Disposition Plan: DC to SNF when medically stable, possibly in 3-4 days     Consultants:  None  Procedures:  None  Antibiotics:  None   Subjective: As per nursing, patient combative, kicking, screaming and spitting at staff earlier this morning. Received a dose of Haldol and currently resting.  Objective: Filed Vitals:   11/23/15 1030 11/23/15 1100 11/23/15 1154 11/23/15 2201  BP: 122/55 114/84 172/67 142/87  Pulse:    122  Temp:  98.2 F (36.8 C)  98.3 F (36.8 C)  TempSrc:  Axillary  Axillary  Resp: 23 18 20 20   SpO2: 97% 93% 90% 97%   No intake or output data in the 24 hours ending 11/24/15 1729 There were no vitals filed for this visit.   Exam:  General exam: Small built and cachectic chronically ill-looking elderly female lying comfortably supine in bed. Oral mucosa dry but better than yesterday. Respiratory system: Clear. No increased work of breathing. Cardiovascular system: S1 & S2 heard, RRR. No JVD, murmurs, gallops, clicks or pedal edema. Gastrointestinal system: Abdomen is nondistended, soft and nontender. Normal bowel sounds  heard. Central nervous system: Somnolent but easily arousable and mumbles incomprehensively,  fidgety. No focal neurological deficits. Extremities: Symmetric 5 x 5 power. Multiple leg ulcers.   Data Reviewed: Basic Metabolic Panel:  Recent Labs Lab 11/18/15 0647 11/19/15 0515 11/23/15 0750 11/24/15 0755  NA 141 139 155* 151*  K 3.1* 2.8* 3.3* 2.9*  CL 94* 89* 99* 105  CO2 32 36* 38* 37*  GLUCOSE 89 114* 104* 119*  BUN 16 19 47* 27*  CREATININE 0.72 0.68 1.00 0.62  CALCIUM 8.8* 9.2 9.7 8.7*  MG  --   --   --  2.5*   Liver Function Tests:  Recent Labs Lab 11/23/15 0750  AST 33  ALT 22  ALKPHOS 94  BILITOT 1.0  PROT 8.0  ALBUMIN 3.9   No results for input(s): LIPASE, AMYLASE in the last 168 hours. No results for input(s): AMMONIA in the last 168 hours. CBC:  Recent Labs Lab 11/18/15 0647 11/19/15 0515 11/23/15 0750 11/24/15 0755  WBC 6.1 8.1 16.3* 11.2*  NEUTROABS  --   --  13.5*  --   HGB 10.2* 12.1 13.7 11.1*  HCT 31.5* 38.1 44.5 36.9  MCV 86.8 86.0 90.6 91.3  PLT 127* 168 430* 341   Cardiac Enzymes: No results for input(s): CKTOTAL, CKMB, CKMBINDEX, TROPONINI in the last 168 hours. BNP (last 3 results) No results for input(s): PROBNP in the last 8760 hours. CBG: No results for input(s): GLUCAP in the last 168 hours.  No results found for this or any previous visit (from the past 240 hour(s)).       Studies: Dg Chest Portable 1 View  11/23/2015  CLINICAL DATA:  Weakness. EXAM: PORTABLE CHEST 1 VIEW COMPARISON:  November 13, 2015. FINDINGS: Stable cardiomediastinal silhouette. Sternotomy wires are noted. No pneumothorax or pleural effusion is noted. Old right rib fractures are again noted. No acute pulmonary disease is noted. IMPRESSION: No acute cardiopulmonary abnormality seen. Electronically Signed   By: Lupita Raider, M.D.   On: 11/23/2015 09:26        Scheduled Meds: . aspirin EC  81 mg Oral Daily  . cholecalciferol  1,000 Units Oral Daily  . collagenase   Topical Daily  . enoxaparin (LOVENOX) injection  30 mg Subcutaneous  Q24H  . feeding supplement (ENSURE ENLIVE)  237 mL Oral Q24H  . feeding supplement (PRO-STAT SUGAR FREE 64)  30 mL Oral TID  . fluconazole  100 mg Oral Daily  . folic acid  1 mg Oral BID  . hydroxychloroquine  200 mg Oral BID  . [START ON 11/29/2015] methotrexate  15 mg Oral Weekly  . metoprolol succinate  50 mg Oral Daily  . multivitamin  5 mL Oral Daily  . senna-docusate  1 tablet Oral BID  . simvastatin  40 mg Oral Daily   Continuous Infusions: . dextrose 75 mL/hr at 11/24/15 1304    Active Problems:   Essential hypertension   CORONARY ATHEROSCLEROSIS, ARTERY BYPASS GRAFT   Rheumatoid arthritis (HCC)   Pressure ulcer   Malnutrition of moderate degree   Acute encephalopathy   Chronic diastolic congestive heart failure (HCC)   Dehydration with hypernatremia   Hypernatremia    Time spent: 35 minutes.    Marcellus Scott, MD, FACP, FHM. [facility] Pager [phone]  If 7PM-7AM, please contact night-coverage [email or web address] Password [email or web address] 11/24/2015, 5:29 PM    LOS: 0 days

## 2015-11-24 NOTE — Progress Notes (Signed)
This shift  Pt continues to be very combative with kicking, screaming and spitting at staff, which is limiting care. Has also refused a.m. Vitals and lab draws. Will continue to monitor pt and intervene as appropriate.

## 2015-11-25 LAB — CBC
HCT: 32.4 % — ABNORMAL LOW (ref 36.0–46.0)
Hemoglobin: 9.8 g/dL — ABNORMAL LOW (ref 12.0–15.0)
MCH: 27.1 pg (ref 26.0–34.0)
MCHC: 30.2 g/dL (ref 30.0–36.0)
MCV: 89.8 fL (ref 78.0–100.0)
Platelets: 306 10*3/uL (ref 150–400)
RBC: 3.61 MIL/uL — AB (ref 3.87–5.11)
RDW: 16.9 % — ABNORMAL HIGH (ref 11.5–15.5)
WBC: 7.7 10*3/uL (ref 4.0–10.5)

## 2015-11-25 LAB — BASIC METABOLIC PANEL
Anion gap: 10 (ref 5–15)
BUN: 18 mg/dL (ref 6–20)
CO2: 33 mmol/L — ABNORMAL HIGH (ref 22–32)
CREATININE: 0.62 mg/dL (ref 0.44–1.00)
Calcium: 8.4 mg/dL — ABNORMAL LOW (ref 8.9–10.3)
Chloride: 104 mmol/L (ref 101–111)
GFR calc Af Amer: >60 mL/min (ref >60)
GFR calc non Af Amer: >60 mL/min (ref >60)
Glucose, Bld: 105 mg/dL — ABNORMAL HIGH (ref 65–99)
Potassium: 3.2 mmol/L — ABNORMAL LOW (ref 3.5–5.1)
SODIUM: 147 mmol/L — AB (ref 135–145)

## 2015-11-25 MED ORDER — POTASSIUM CHLORIDE 10 MEQ/100ML IV SOLN
10.0000 meq | INTRAVENOUS | Status: AC
  Administered 2015-11-25 (×4): 10 meq via INTRAVENOUS
  Filled 2015-11-25 (×4): qty 100

## 2015-11-25 MED ORDER — POTASSIUM CHLORIDE 2 MEQ/ML IV SOLN
INTRAVENOUS | Status: AC
  Administered 2015-11-25: 13:00:00 via INTRAVENOUS
  Filled 2015-11-25 (×2): qty 1000

## 2015-11-25 NOTE — Progress Notes (Addendum)
PROGRESS NOTE    [name] TKW:[id number] DOB: 14-Aug-1932 DOA: 11/23/2015 PCP: Rogelia Boga, MD  HPI/Brief narrative 82 y.o. female , recent hospitalization 11/13/15-11/19/15 with diagnosis of acute encephalopathy, sepsis secondary to UTI, was discharged to SNF, returns to the [facility] ED on 11/23/15 with complaints of worsening confusion/combativeness and decreased oral intake. She has PMH of ASCVD, CABG, AAA repair, rheumatoid arthritis, chronic low back pain, chronic diastolic CHF, chronic lower extremity venous stasis ulcers, HTN.In the ED, lab work shows sodium 155, potassium 3.3, chloride 99, bicarbonate 38, BUN 47, creatinine 1, WBC 16.3, chest x-ray without acute abnormality and urine microscopy not indicative of UTI. Hospitalist admission was requested.    Assessment/Plan:  Dehydration with hypernatremia - Secondary to poor oral intake and diuretics. Diuretics were discontinued at the SNF on 12/27. - Hydrate with hypotonic IV fluids and slowly correct sodium by 8 mEq per 24 hours. - Follow BMP in a.m. Encourage oral intake. - Slowly improving.   Hypokalemia - Replace IV and follow in a.m. Magnesium: 2.5. - Improving  Acute encephalopathy - Likely secondary to dehydration, hypernatremia complicating? Underlying dementia. No focal deficits on exam. No lab workup suggesting infectious source (chest x-ray negative and urine microscopy not suggestive of UTI) - Hydrate and follow clinically. - Recent head CT 11/13/15: No acute intracranial abnormalities. - Patient combative, kicking, screaming, spitting at staff limiting care. Started when necessary low-dose IV Haldol after ensuring QTC normal. Monitor. - Behavior controlled with Haldol. Hopefully mental status clears up with improvement in metabolic abnormalities. If does not, may have to consider further evaluation.  Essential hypertension - Continue metoprolol. DC HCTZ indefinitely  Dysphagia -  Patient was recently seen by speech therapy on 12/23 and was placed on dysphagia 2 diet and nectar thickened liquids. Some of this may be secondary to dry mouth and tongue coating. She was also started on treatment for oral thrush with Diflucan at SNF-continue same. -Nursing to provide aggressive oral care.  - Speech therapy recommended dysphagia 1 diet and nectar thickened liquids.  Multiple bilateral lower extremity ulcers and stage II sacral decubitus ulcer  - Wound care consulted.   Chronic diastolic CHF - Currently dehydrated. Diuretics on hold  Malnutrition - Dietitian consultation  Rheumatoid arthritis - Continue home medications  CAD  Recently treated UTI - Urine microscopy not impressive for another UTI.  Anemia - Dilutional. Follow CBCs   DVT prophylaxis: Lovenox  Code Status:DO NOT RESUSCITATE-has out of facility sheet confirming this. Family Communication: None at bedside. Left message for patient's son Mr. Jimesha Rising on 11/25/2015. Disposition Plan: DC to SNF when medically stable, possibly in 3-4 days     Consultants:  None  Procedures:  None  Antibiotics:  None   Subjective: As per nursing, patient continues to be combative, kicking, screaming and spitting. Not taking medications consistently.   Objective: Filed Vitals:   11/24/15 2207 11/25/15 0444 11/25/15 0558 11/25/15 1455  BP: 116/52 118/61 118/55 120/58  Pulse: 100 107 98 96  Temp: 98 F (36.7 C) 97.7 F (36.5 C) 98.6 F (37 C) 98.6 F (37 C)  TempSrc: Axillary Axillary Axillary Axillary  Resp: 19 19 20 20   Weight:  49.1 kg (108 lb 3.9 oz)    SpO2: 99% 99% 99% 100%   No intake or output data in the 24 hours ending 11/25/15 1502 Filed Weights   11/25/15 0444  Weight: 49.1 kg (108 lb 3.9 oz)     Exam:  General exam: Small  built and cachectic chronically ill-looking elderly female lying comfortably supine in bed. Keeps talking incoherently. Respiratory system: Clear. No  increased work of breathing. Cardiovascular system: S1 & S2 heard, RRR. No JVD, murmurs, gallops, clicks or pedal edema. Gastrointestinal system: Abdomen is nondistended, soft and nontender. Normal bowel sounds heard. Central nervous system: Alert and incoherent. No focal neurological deficits. Extremities: Symmetric 5 x 5 power. Multiple leg ulcers.   Data Reviewed: Basic Metabolic Panel:  Recent Labs Lab 11/19/15 0515 11/23/15 0750 11/24/15 0755 11/25/15 0910  NA 139 155* 151* 147*  K 2.8* 3.3* 2.9* 3.2*  CL 89* 99* 105 104  CO2 36* 38* 37* 33*  GLUCOSE 114* 104* 119* 105*  BUN 19 47* 27* 18  CREATININE 0.68 1.00 0.62 0.62  CALCIUM 9.2 9.7 8.7* 8.4*  MG  --   --  2.5*  --    Liver Function Tests:  Recent Labs Lab 11/23/15 0750  AST 33  ALT 22  ALKPHOS 94  BILITOT 1.0  PROT 8.0  ALBUMIN 3.9   No results for input(s): LIPASE, AMYLASE in the last 168 hours. No results for input(s): AMMONIA in the last 168 hours. CBC:  Recent Labs Lab 11/19/15 0515 11/23/15 0750 11/24/15 0755 11/25/15 0910  WBC 8.1 16.3* 11.2* 7.7  NEUTROABS  --  13.5*  --   --   HGB 12.1 13.7 11.1* 9.8*  HCT 38.1 44.5 36.9 32.4*  MCV 86.0 90.6 91.3 89.8  PLT 168 430* 341 306   Cardiac Enzymes: No results for input(s): CKTOTAL, CKMB, CKMBINDEX, TROPONINI in the last 168 hours. BNP (last 3 results) No results for input(s): PROBNP in the last 8760 hours. CBG: No results for input(s): GLUCAP in the last 168 hours.  No results found for this or any previous visit (from the past 240 hour(s)).       Studies: No results found.      Scheduled Meds: . aspirin EC  81 mg Oral Daily  . cholecalciferol  1,000 Units Oral Daily  . collagenase   Topical Daily  . enoxaparin (LOVENOX) injection  30 mg Subcutaneous Q24H  . feeding supplement (ENSURE ENLIVE)  237 mL Oral Q24H  . feeding supplement (PRO-STAT SUGAR FREE 64)  30 mL Oral TID  . fluconazole  100 mg Oral Daily  . folic acid  1  mg Oral BID  . hydroxychloroquine  200 mg Oral BID  . [START ON 11/29/2015] methotrexate  15 mg Oral Weekly  . metoprolol succinate  50 mg Oral Daily  . multivitamin  5 mL Oral Daily  . potassium chloride  10 mEq Intravenous Q1 Hr x 4  . senna-docusate  1 tablet Oral BID  . simvastatin  40 mg Oral Daily   Continuous Infusions: . dextrose 5 % with kcl 75 mL/hr at 11/25/15 1233    Active Problems:   Essential hypertension   CORONARY ATHEROSCLEROSIS, ARTERY BYPASS GRAFT   Rheumatoid arthritis (HCC)   Pressure ulcer   Malnutrition of moderate degree   Acute encephalopathy   Chronic diastolic congestive heart failure (HCC)   Dehydration with hypernatremia   Hypernatremia    Time spent: 30 minutes.    Marcellus Scott, MD, FACP, FHM. [facility] Pager [phone]  If 7PM-7AM, please contact night-coverage [email or web address] Password [email or web address] 11/25/2015, 3:02 PM    LOS: 1 day

## 2015-11-25 NOTE — Progress Notes (Signed)
Patient noted to be very agitated kicking and screaming and having visual hallucinations at this time.  Levora Angel, RN

## 2015-11-26 DIAGNOSIS — F39 Unspecified mood [affective] disorder: Secondary | ICD-10-CM

## 2015-11-26 LAB — BASIC METABOLIC PANEL
ANION GAP: 9 (ref 5–15)
BUN: 13 mg/dL (ref 6–20)
CALCIUM: 8.9 mg/dL (ref 8.9–10.3)
CO2: 30 mmol/L (ref 22–32)
Chloride: 103 mmol/L (ref 101–111)
Creatinine, Ser: 0.54 mg/dL (ref 0.44–1.00)
GFR calc Af Amer: >60 mL/min (ref >60)
GLUCOSE: 89 mg/dL (ref 65–99)
Potassium: 4.6 mmol/L (ref 3.5–5.1)
Sodium: 142 mmol/L (ref 135–145)

## 2015-11-26 LAB — CBC
HCT: 37 % (ref 36.0–46.0)
HEMOGLOBIN: 11.5 g/dL — AB (ref 12.0–15.0)
MCH: 27.8 pg (ref 26.0–34.0)
MCHC: 31.1 g/dL (ref 30.0–36.0)
MCV: 89.6 fL (ref 78.0–100.0)
PLATELETS: 359 10*3/uL (ref 150–400)
RBC: 4.13 MIL/uL (ref 3.87–5.11)
RDW: 16.6 % — AB (ref 11.5–15.5)
WBC: 7 10*3/uL (ref 4.0–10.5)

## 2015-11-26 MED ORDER — LORAZEPAM 2 MG/ML IJ SOLN
0.5000 mg | Freq: Once | INTRAMUSCULAR | Status: AC
  Administered 2015-11-26: 0.5 mg via INTRAMUSCULAR
  Filled 2015-11-26: qty 1

## 2015-11-26 MED ORDER — MIRTAZAPINE 15 MG PO TBDP
15.0000 mg | ORAL_TABLET | Freq: Every day | ORAL | Status: DC
  Filled 2015-11-26 (×2): qty 1

## 2015-11-26 MED ORDER — POTASSIUM CHLORIDE 2 MEQ/ML IV SOLN
INTRAVENOUS | Status: DC
  Administered 2015-11-26: 21:00:00 via INTRAVENOUS
  Filled 2015-11-26 (×2): qty 1000

## 2015-11-26 MED ORDER — RISPERIDONE 0.5 MG PO TBDP
0.5000 mg | ORAL_TABLET | Freq: Every day | ORAL | Status: DC
  Filled 2015-11-26 (×2): qty 1

## 2015-11-26 MED ORDER — HALOPERIDOL LACTATE 5 MG/ML IJ SOLN
1.0000 mg | Freq: Four times a day (QID) | INTRAMUSCULAR | Status: DC | PRN
  Administered 2015-11-26: 1 mg via INTRAMUSCULAR
  Filled 2015-11-26: qty 1

## 2015-11-26 NOTE — Consult Note (Signed)
[facility] Consult   Reason for Consult:  Confused and combative with staff and AMS Referring Physician:  Dr. Algis Liming Patient Identification: [name] MRN:  [id number] Principal Diagnosis: <principal problem not specified> Diagnosis:   Patient Active Problem List   Diagnosis Date Noted  . Hypernatremia [E87.0] 11/24/2015  . Dehydration with hypernatremia [E87.0] 11/23/2015  . Pressure ulcer [L89.90] 11/14/2015  . Malnutrition of moderate degree [E44.0] 11/14/2015  . Acute encephalopathy [G93.40] 11/14/2015  . Pancytopenia ([place]) [Y07.371] 11/14/2015  . Transaminitis [R74.0] 11/14/2015  . Chronic diastolic congestive heart failure ([place]) [I50.32] 11/14/2015  . Chronic cutaneous venous stasis ulcer ([place]) [I83.009] 11/14/2015  . Rheumatoid arthritis ([place]) [M06.9] 11/13/2015  . Hereditary and idiopathic peripheral neuropathy [G60.9] 05/17/2015  . History of aortic aneurysm [Z86.79]   . CORONARY ATHEROSCLEROSIS, ARTERY BYPASS GRAFT [I25.810] 08/23/2008  . Essential hypertension [I10] 05/11/2008  . FIBROCYSTIC BREAST DISEASE, HX OF [Z87.898] 05/21/2007    Total Time spent with patient: 1 hour  Subjective:   [name] is a 82 y.o. female patient admitted with AMS.  HPI:  [name] is a 82 y.o. female, recent resident of SNF admitted to [facility] for increased symptoms of worsening confusion, emotion instability and combativeness with staff. patient has been frustrated, upset because no one believe her. She is poor historian and has no family at bed side. She has been suffering with loss of singificant cognitions. She does not know why she was in hospital and her current medical or emotional problems. She knows her name, age and being hospitalized. She complaints about not able to eat, drink and sleep better. She denied suicide or homicide ideation, intention or plans. Will ask unit social service to contact SNF and family contact regarding her baseline  functioning. She has a walker in her room and has multiple laceration on her legs and hands.   Medical history: She was recent hospitalization 11/13/15-11/19/15 with diagnosis of acute encephalopathy, sepsis secondary to UTI, was discharged to SNF, returns to the [facility] ED on 11/23/15 with complaints of worsening confusion/combativeness and decreased oral intake. She has PMH of ASCVD, CABG, AAA repair, rheumatoid arthritis, chronic low back pain, chronic diastolic CHF, chronic lower extremity venous stasis ulcers, HTN. Patient is confused and not a reliable historian. As per ED staff, patient was spitting at them. As per report, she has been refusing to take her medications, increasingly combative towards staff which has limited her care at the SNF. SNF thereby sent her to the hospital for IV therapy. In the ED, lab work shows sodium 155, potassium 3.3, chloride 99, bicarbonate 38, BUN 47, creatinine 1, WBC 16.3, chest x-ray without acute abnormality and urine microscopy not indicative of UTI. Hospitalist admission was requested.   Past Psychiatric History: None reported  Risk to Self: Is patient at risk for suicide?: No Risk to Others:   Prior Inpatient Therapy:   Prior Outpatient Therapy:    Past Medical History:  Past Medical History  Diagnosis Date  . DEPRESSION 05/21/2007  . FIBROCYSTIC BREAST DISEASE, HX OF 05/21/2007  . HYPERLIPIDEMIA-MIXED 11/26/2008    takes Simvastatin daily  . Irritable bowel syndrome 05/21/2007  . KNEE PAIN 09/16/2007  . LEG EDEMA, BILATERAL 05/21/2009  . OSTEOPENIA 05/21/2007  . URTICARIA, CHRONIC 05/21/2007  . History of aortic aneurysm     resection and grafting of a 5.2-cm ascending aortic arch aneurysm August 2009  . Dyslipidemia   . Vitamin D deficiency   . Urticaria   .  Neuropathy (HCC)     left radial  neuropathy with wrist drop  . Diverticulosis   . Irritable bowel syndrome   . GERD (gastroesophageal reflux disease)   . Injury to radial  nerve     R - hand neuropathy  . Vaginal yeast infection     recently told to use monistat, hasn't started as of 01/26/2012  . HYPERTENSION 05/11/2008    takes Metoprolol and HCTZ daily  . Asthma     hx of;as a child  . Seasonal allergies     takes Hydroxyzine prn and Zyrtec 2 times a week  . Peripheral edema     takes Furosemide prn  . Pneumonia     history of;in the 90's  . Weakness     hands and feet  . Sciatica   . OSTEOARTHRITIS 01/20/2008    takes Methotrexate weekly  . DJD (degenerative joint disease)     history of   . Rheumatoid arthritis(714.0)   . Chronic back pain     takes Norco prn  . Bruises easily     takes Plaquenil daily  . CORONARY ATHEROSCLEROSIS, ARTERY BYPASS GRAFT 08/23/2008  . CAD (coronary artery disease)     s/p CABG -  x 1 with LIMA to LAD August 2009 /   . Urinary incontinence   . Bowel incontinence   . History of colon polyps   . Urinary frequency   . Urinary urgency   . Nocturia   . Anemia     takes folic acid bid  . Urinary incontinence     wears depends  . Hereditary and idiopathic peripheral neuropathy 05/17/2015    Past Surgical History  Procedure Laterality Date  . Appendectomy    . Cesarean section  1968    x 1  . Hernia repair    . Coronary artery bypass graft  August 2009    CABG x 1 with LIMA to LAD /  . Ascending aortic aneurysm repair  August 2009     resection and grafting of a 5.2-cm ascending aortic arch aneurysm  . Joint replacement      2009 & 2010- both knees  . Back surgery      2011- cerv. fusion   . Breast surgery      biopsy x2- R breast  . Tonsillectomy      as a child  . Cardiac catheterization  2009  . Colonoscopy    . Lumbar laminectomy/decompression microdiscectomy Bilateral 07/11/2013    Procedure: LUMBAR LAMINECTOMY/DECOMPRESSION MICRODISCECTOMY LUMBAR FIVE SACRAL ONE;  Surgeon: Elaina Hoops, MD;  Location: [facility];  Service: Neurosurgery;  Laterality: Bilateral;   Family History:  Family History   Problem Relation Age of Onset  . Heart disease    . Coronary artery disease    . Anesthesia problems Neg Hx   . Hypotension Neg Hx   . Malignant hyperthermia Neg Hx   . Pseudochol deficiency Neg Hx   . Heart disease Mother   . Stroke Father   . Cancer Maternal Grandmother   . Stroke Paternal Grandmother    Family Psychiatric  History: unknown Social History:  History  Alcohol Use No     History  Drug Use No    Social History   Social History  . Marital Status: Widowed    Spouse Name: N/A  . Number of Children: 2  . Years of Education: N/A   Occupational History  . retired    Social History  Main Topics  . Smoking status: Never Smoker   . Smokeless tobacco: Never Used  . Alcohol Use: No  . Drug Use: No  . Sexual Activity: No   Other Topics Concern  . None   Social History Narrative   Patient is right handed.   Patient drinks a little caffeine daily.   Additional Social History:                          Allergies:   Allergies  Allergen Reactions  . Amoxicillin     REACTION: unspecified  . Cyclobenzaprine Hcl     REACTION: rash  . Mupirocin     Unknown reaction  . Penicillins Hives  . Tetanus Toxoid Hives    Labs:  Results for orders placed or performed during the hospital encounter of 11/23/15 (from the past 48 hour(s))  CBC     Status: Abnormal   Collection Time: 11/25/15  9:10 AM  Result Value Ref Range   WBC 7.7 4.0 - 10.5 K/uL   RBC 3.61 (L) 3.87 - 5.11 MIL/uL   Hemoglobin 9.8 (L) 12.0 - 15.0 g/dL   HCT 32.4 (L) 36.0 - 46.0 %   MCV 89.8 78.0 - 100.0 fL   MCH 27.1 26.0 - 34.0 pg   MCHC 30.2 30.0 - 36.0 g/dL   RDW 16.9 (H) 11.5 - 15.5 %   Platelets 306 150 - 400 K/uL  Basic metabolic panel     Status: Abnormal   Collection Time: 11/25/15  9:10 AM  Result Value Ref Range   Sodium 147 (H) 135 - 145 mmol/L   Potassium 3.2 (L) 3.5 - 5.1 mmol/L   Chloride 104 101 - 111 mmol/L   CO2 33 (H) 22 - 32 mmol/L   Glucose, Bld 105 (H) 65  - 99 mg/dL   BUN 18 6 - 20 mg/dL   Creatinine, Ser 0.62 0.44 - 1.00 mg/dL   Calcium 8.4 (L) 8.9 - 10.3 mg/dL   GFR calc non Af Amer >60 >60 mL/min   GFR calc Af Amer >60 >60 mL/min    Comment: (NOTE) The eGFR has been calculated using the CKD EPI equation. This calculation has not been validated in all clinical situations. eGFR's persistently <60 mL/min signify possible Chronic Kidney Disease.    Anion gap 10 5 - 15  CBC     Status: Abnormal   Collection Time: 11/26/15  8:45 AM  Result Value Ref Range   WBC 7.0 4.0 - 10.5 K/uL   RBC 4.13 3.87 - 5.11 MIL/uL   Hemoglobin 11.5 (L) 12.0 - 15.0 g/dL   HCT 37.0 36.0 - 46.0 %   MCV 89.6 78.0 - 100.0 fL   MCH 27.8 26.0 - 34.0 pg   MCHC 31.1 30.0 - 36.0 g/dL   RDW 16.6 (H) 11.5 - 15.5 %   Platelets 359 150 - 400 K/uL  Basic metabolic panel     Status: None   Collection Time: 11/26/15  8:45 AM  Result Value Ref Range   Sodium 142 135 - 145 mmol/L   Potassium 4.6 3.5 - 5.1 mmol/L    Comment: DELTA CHECK NOTED REPEATED TO VERIFY NO VISIBLE HEMOLYSIS    Chloride 103 101 - 111 mmol/L   CO2 30 22 - 32 mmol/L   Glucose, Bld 89 65 - 99 mg/dL   BUN 13 6 - 20 mg/dL   Creatinine, Ser 0.54 0.44 - 1.00 mg/dL   Calcium  8.9 8.9 - 10.3 mg/dL   GFR calc non Af Amer >60 >60 mL/min   GFR calc Af Amer >60 >60 mL/min    Comment: (NOTE) The eGFR has been calculated using the CKD EPI equation. This calculation has not been validated in all clinical situations. eGFR's persistently <60 mL/min signify possible Chronic Kidney Disease.    Anion gap 9 5 - 15    Current Facility-Administered Medications  Medication Dose Route Frequency Provider Last Rate Last Dose  . acetaminophen (TYLENOL) tablet 650 mg  650 mg Oral Q6H PRN Modena Jansky, MD       Or  . acetaminophen (TYLENOL) suppository 650 mg  650 mg Rectal Q6H PRN Modena Jansky, MD      . albuterol (PROVENTIL) (2.5 MG/3ML) 0.083% nebulizer solution 2.5 mg  2.5 mg Nebulization Q2H PRN  Modena Jansky, MD      . aspirin EC tablet 81 mg  81 mg Oral Daily Modena Jansky, MD   81 mg at 11/23/15 1619  . cholecalciferol (VITAMIN D) tablet 1,000 Units  1,000 Units Oral Daily Modena Jansky, MD   1,000 Units at 11/23/15 1619  . enoxaparin (LOVENOX) injection 30 mg  30 mg Subcutaneous Q24H Modena Jansky, MD   30 mg at 11/24/15 1658  . feeding supplement (ENSURE ENLIVE) (ENSURE ENLIVE) liquid 237 mL  237 mL Oral Q24H Clayton Bibles, RD   237 mL at 11/24/15 1508  . feeding supplement (PRO-STAT SUGAR FREE 64) liquid 30 mL  30 mL Oral TID Clayton Bibles, RD   30 mL at 11/24/15 1655  . fluconazole (DIFLUCAN) tablet 100 mg  100 mg Oral Daily Modena Jansky, MD   100 mg at 11/23/15 1619  . folic acid (FOLVITE) tablet 1 mg  1 mg Oral BID Modena Jansky, MD   1 mg at 11/23/15 1619  . haloperidol lactate (HALDOL) injection 1 mg  1 mg Intravenous Q6H PRN Modena Jansky, MD   1 mg at 11/25/15 1608  . haloperidol lactate (HALDOL) injection 1 mg  1 mg Intramuscular Q6H PRN Modena Jansky, MD      . hydroxychloroquine (PLAQUENIL) tablet 200 mg  200 mg Oral BID Modena Jansky, MD   200 mg at 11/23/15 1619  . [START ON 11/29/2015] methotrexate (RHEUMATREX) tablet 15 mg  15 mg Oral Weekly Modena Jansky, MD      . metoprolol succinate (TOPROL-XL) 24 hr tablet 50 mg  50 mg Oral Daily Modena Jansky, MD   50 mg at 11/23/15 1619  . multivitamin liquid 5 mL  5 mL Oral Daily Clayton Bibles, RD   5 mL at 11/24/15 1509  . senna-docusate (Senokot-S) tablet 1 tablet  1 tablet Oral BID Modena Jansky, MD   1 tablet at 11/23/15 1626  . simvastatin (ZOCOR) tablet 40 mg  40 mg Oral Daily Modena Jansky, MD   40 mg at 11/23/15 1619    Musculoskeletal: Strength & Muscle Tone: decreased Gait & Station: unable to stand Patient leans: N/A  Psychiatric Specialty Exam: ROS complained poor sleep and not able to eat and drink.  No Fever-chills, No Headache, No changes with Vision or hearing,  reports vertigo No problems swallowing food or Liquids, No Chest pain, Cough or Shortness of Breath, No Abdominal pain, No Nausea or Vommitting, Bowel movements are regular, No Blood in stool or Urine, No dysuria, No new skin rashes or bruises, No new joints pains-aches,  No new weakness, tingling, numbness in any extremity, No recent weight gain or loss, No polyuria, polydypsia or polyphagia,   A full 10 point Review of Systems was done, except as stated above, all other Review of Systems were negative.  Blood pressure 147/58, pulse 98, temperature 98 F (36.7 C), temperature source Axillary, resp. rate 20, weight 49.6 kg (109 lb 5.6 oz), SpO2 97 %.Body mass index is 19.99 kg/(m^2).  General Appearance: Bizarre and Guarded  Eye Contact::  Poor  Speech:  Clear and Coherent and Slow  Volume:  Decreased  Mood:  Anxious, Depressed and Irritable  Affect:  Depressed and Tearful  Thought Process:  Disorganized and Irrelevant  Orientation:  Other:  to her name, friend name, age and being hospitalized only.   Thought Content:  Rumination and agitation  Suicidal Thoughts:  No  Homicidal Thoughts:  No  Memory:  Immediate;   Poor Recent;   Poor  Judgement:  Impaired  Insight:  Shallow  Psychomotor Activity:  Decreased  Concentration:  Fair  Recall:  Delta: Fair  Akathisia:  Negative  Handed:  Right  AIMS (if indicated):     Assets:  Communication Skills Desire for Improvement Financial Resources/Insurance Leisure Time Resilience Social Support  ADL's:  Impaired  Cognition: Impaired,  Mild  Sleep:      Treatment Plan Summary: Daily contact with patient to assess and evaluate symptoms and progress in treatment and Medication management  Will benefit from Risperidone M- tab 0.5 mg PO Qhs for paranoia and remeron sol tab 48m at bed time for insomnia.    Disposition: Patient does not meet criteria for psychiatric inpatient  admission. Supportive therapy provided about ongoing stressors.  Patient will be referred to SNF when medically stable  Michaelia Beilfuss,JANARDHAHA R. 11/26/2015 12:02 PM

## 2015-11-26 NOTE — Progress Notes (Signed)
OT Cancellation Note  Patient Details Name: QUINLEE SCIARRA MRN: [id number] DOB: 1932/06/12   Cancelled Treatment:    Reason Eval/Treat Not Completed: Other (comment).  Pt remains agitated.  Rosita Guzzetta 11/26/2015, 4:11 PM  Marica Otter, OTR/L [phone] 11/26/2015

## 2015-11-26 NOTE — Progress Notes (Signed)
OT Cancellation Note  Patient Details Name: GLADIES SOFRANKO MRN: [id number] DOB: 18-Jan-1932   Cancelled Treatment:    Reason Eval/Treat Not Completed: Other (comment). Pt with psych at this time.  Will check back tomorrow.    Neola Worrall 11/26/2015, 2:12 PM  Marica Otter, OTR/L [phone] 11/26/2015

## 2015-11-26 NOTE — Progress Notes (Signed)
PROGRESS NOTE    WILHEMINA GRALL OYD:[id number] DOB: 1932/05/14 DOA: 11/23/2015 PCP: Rogelia Boga, MD  HPI/Brief narrative 82 y.o. female , recent hospitalization 11/13/15-11/19/15 with diagnosis of acute encephalopathy, sepsis secondary to UTI, was discharged to SNF, returns to the [facility] ED on 11/23/15 with complaints of worsening confusion/combativeness and decreased oral intake. She has PMH of ASCVD, CABG, AAA repair, rheumatoid arthritis, chronic low back pain, chronic diastolic CHF, chronic lower extremity venous stasis ulcers, HTN.In the ED, lab work shows sodium 155, potassium 3.3, chloride 99, bicarbonate 38, BUN 47, creatinine 1, WBC 16.3, chest x-ray without acute abnormality and urine microscopy not indicative of UTI. Hospitalist admission was requested. Patient was hydrated and her sodium has normalized. Electrolytes were replaced. Despite all treatment, continues to be very confused with behavioral abnormalities and hallucinations. Psychiatric consulted on 1/2. As per family, prior to previous hospitalization, lived alone (vertigo), ambulated with help of a walker and was coherent with some memory impairment appropriate for her age.   Assessment/Plan:  Dehydration with hypernatremia - Secondary to poor oral intake and diuretics. Diuretics were discontinued at the SNF on 12/27. - Hydrated gently with hypotonic IV fluids and over the course of 3 days, hypernatremia has resolved and dehydration has improved. - Patient has pulled out IV line and is belligerent. She is at risk for recurrent dehydration from poor oral intake.  Hypokalemia - Replaced intravenously but at risk for recurrent hypokalemia secondary to poor oral intake.  Acute encephalopathy - Likely secondary to dehydration, hypernatremia complicating? Underlying dementia. No focal deficits on exam. No lab workup suggesting infectious source (chest x-ray negative and urine microscopy not suggestive  of UTI) - Hydrate and follow clinically. - Recent head CT 11/13/15: No acute intracranial abnormalities. - Patient combative, kicking, screaming, spitting at staff limiting care. Started when necessary low-dose IV Haldol after ensuring QTC normal. Monitor. - Patient's abnormal behavior is persisting. As per RN, hallucinations last night. Psychiatric consulted and have started patient on risperidone and Remeron. Follow.  Essential hypertension - Continue metoprolol. DC HCTZ indefinitely. Controlled.  Dysphagia - Patient was recently seen by speech therapy on 12/23 and was placed on dysphagia 2 diet and nectar thickened liquids. Some of this may be secondary to dry mouth and tongue coating. She was also started on treatment for oral thrush with Diflucan at SNF-continue same. -Nursing to provide aggressive oral care.  - Speech therapy recommended dysphagia 1 diet and nectar thickened liquids. - Patient not cooperating with any aspect of medical care.  Multiple bilateral lower extremity ulcers and stage II sacral decubitus ulcer  - Wound care consulted. Discussed with wound care team on 1/2. Wounds appear clean.  Chronic diastolic CHF -Compensated. Diuretics on hold  Malnutrition - Dietitian consultation  Rheumatoid arthritis - Continue home medications  CAD  Recently treated UTI - Urine microscopy not impressive for another UTI.  Anemia - Dilutional. Stable   DVT prophylaxis: Lovenox  Code Status:DO NOT RESUSCITATE-has out of facility sheet confirming this. Family Communication: Discussed with patient's son Mr. Elyn Krogh on 11/26/2015. Disposition Plan: DC to SNF when medically stable.    Consultants:  None  Procedures:  None  Antibiotics:  None   Subjective: As per nursing, patient continues to be combative, kicking, screaming and spitting. Not taking medications consistently.  Hallucinations reported.   Objective: Filed Vitals:   11/25/15 1455 11/25/15  2304 11/26/15 0628 11/26/15 1421  BP: 120/58 112/68 147/58 134/47  Pulse: 96 87 98 105  Temp:  98.6 F (37 C) 97.9 F (36.6 C) 98 F (36.7 C) 97.7 F (36.5 C)  TempSrc: Axillary Axillary Axillary Axillary  Resp: 20 19 20 20   Weight:   49.6 kg (109 lb 5.6 oz)   SpO2: 100% 98% 97% 99%   No intake or output data in the 24 hours ending 11/26/15 1723 Filed Weights   11/25/15 0444 11/26/15 0628  Weight: 49.1 kg (108 lb 3.9 oz) 49.6 kg (109 lb 5.6 oz)     Exam:  General exam: Small built and cachectic chronically ill-looking elderly female lying comfortably supine in bed. Keeps talking incoherently. As you enter the room and go close to her bed, starts attempting to kick  Respiratory system: Clear. No increased work of breathing. Cardiovascular system: S1 & S2 heard, RRR. No JVD, murmurs, gallops, clicks or pedal edema. Gastrointestinal system: Abdomen is nondistended, soft and nontender. Normal bowel sounds heard. Central nervous system: Alert and incoherent. No focal neurological deficits. Extremities: Symmetric 5 x 5 power. Multiple leg ulcers.   Data Reviewed: Basic Metabolic Panel:  Recent Labs Lab 11/23/15 0750 11/24/15 0755 11/25/15 0910 11/26/15 0845  NA 155* 151* 147* 142  K 3.3* 2.9* 3.2* 4.6  CL 99* 105 104 103  CO2 38* 37* 33* 30  GLUCOSE 104* 119* 105* 89  BUN 47* 27* 18 13  CREATININE 1.00 0.62 0.62 0.54  CALCIUM 9.7 8.7* 8.4* 8.9  MG  --  2.5*  --   --    Liver Function Tests:  Recent Labs Lab 11/23/15 0750  AST 33  ALT 22  ALKPHOS 94  BILITOT 1.0  PROT 8.0  ALBUMIN 3.9   No results for input(s): LIPASE, AMYLASE in the last 168 hours. No results for input(s): AMMONIA in the last 168 hours. CBC:  Recent Labs Lab 11/23/15 0750 11/24/15 0755 11/25/15 0910 11/26/15 0845  WBC 16.3* 11.2* 7.7 7.0  NEUTROABS 13.5*  --   --   --   HGB 13.7 11.1* 9.8* 11.5*  HCT 44.5 36.9 32.4* 37.0  MCV 90.6 91.3 89.8 89.6  PLT 430* 341 306 359   Cardiac  Enzymes: No results for input(s): CKTOTAL, CKMB, CKMBINDEX, TROPONINI in the last 168 hours. BNP (last 3 results) No results for input(s): PROBNP in the last 8760 hours. CBG: No results for input(s): GLUCAP in the last 168 hours.  No results found for this or any previous visit (from the past 240 hour(s)).       Studies: No results found.      Scheduled Meds: . aspirin EC  81 mg Oral Daily  . cholecalciferol  1,000 Units Oral Daily  . enoxaparin (LOVENOX) injection  30 mg Subcutaneous Q24H  . feeding supplement (ENSURE ENLIVE)  237 mL Oral Q24H  . feeding supplement (PRO-STAT SUGAR FREE 64)  30 mL Oral TID  . fluconazole  100 mg Oral Daily  . folic acid  1 mg Oral BID  . hydroxychloroquine  200 mg Oral BID  . [START ON 11/29/2015] methotrexate  15 mg Oral Weekly  . metoprolol succinate  50 mg Oral Daily  . mirtazapine  15 mg Oral QHS  . multivitamin  5 mL Oral Daily  . risperiDONE  0.5 mg Oral QHS  . senna-docusate  1 tablet Oral BID  . simvastatin  40 mg Oral Daily   Continuous Infusions:    Active Problems:   Essential hypertension   CORONARY ATHEROSCLEROSIS, ARTERY BYPASS GRAFT   Rheumatoid arthritis (HCC)   Pressure ulcer  Malnutrition of moderate degree   Acute encephalopathy   Chronic diastolic congestive heart failure (HCC)   Dehydration with hypernatremia   Hypernatremia    Time spent: 30 minutes.    Marcellus Scott, MD, FACP, FHM. [facility] Pager [phone]  If 7PM-7AM, please contact night-coverage [email or web address] Password [email or web address] 11/26/2015, 5:23 PM    LOS: 2 days

## 2015-11-26 NOTE — Progress Notes (Signed)
Speech Language Pathology Treatment: Dysphagia  Patient Details Name: TAGEN BRETHAUER MRN: [id number] DOB: [birth year] Today's Date: 11/26/2015 Time: 1415-1440 SLP Time Calculation (min) (ACUTE ONLY): 25 min  Assessment / Plan / Recommendation Clinical Impression  Pt seen - sitting upright in bed - with wet gurgly voice at baseline.  Pt absolutely detests the thickened liquids and expressed desire for ice. She was also willing to accept ice cream.  Wet voice and likely weak pharyngeal swallow noted across consistencies (thin via tsp, icecream, ice) but fortunately able to cough/clear throat and expectorate frothy secretions on command.      Pt is confused and fluctuates from tearful to agitated and requested SLP "get out" until po offered.  Pt hollering for ice upon SLP leaving which SLP provided 2 more boluses.  Do not suspect improved tolerance of nectar/ puree over thins via tsp and given negative CXR and for pt QOL, will advance.  Will follow up.   HPI HPI: 82 yo female, recent hospitalization 11/13/15-11/19/15 with diagnosis of acute encephalopathy, sepsis secondary to UTI, was discharged to SNF, admitted with dehydration, hypernatremia. Hx of RA, CABG, GERD, PNA (1990s),chronic low back pain.Swallow evaluated last admission 12/23 and patient noted to have oropharyngeal dysphagia impacted by alertness and cognitive status, placed on dysphagia 2 diet with nectar thick liquid. Acute CXR negative.       SLP Plan  Continue with current plan of care     Recommendations  Diet recommendations: Thin liquid (full liquids) Liquids provided via: Teaspoon Medication Administration: Crushed with puree Supervision: Staff to assist with self feeding;Full supervision/cueing for compensatory strategies Compensations: Slow rate;Small sips/bites (clear throat/cough) Postural Changes and/or Swallow Maneuvers: Seated upright 90 degrees;Upright 30-60 min after meal              Oral Care Recommendations:  Oral care QID Follow up Recommendations: Skilled Nursing facility Plan: Continue with current plan of care   Donavan Burnet, MS [facility] SLP [phone]

## 2015-11-26 NOTE — Consult Note (Signed)
WOC wound consult note Reason for Consult:bilateral LE wounds Wound type:Mixed etiology, chronic venous and arterial insufficiency. Hairless legs with thin brittle nails are consistent with PAD, hemosiderin staining is consistent with chronic venous insufficiency. Punctate ulcerations with smooth edges on areas other than the medial malleolus is consistent with PAD Pressure Ulcer POA: No Measurement: Left proximal wound: 0.5cm x 0.5cm x 0.2cm  Left distal wound: 1.0cm x 1.0cm x 0.2cm  Right proximal thigh: 2cm x 1cm x 0.3cm  Right distal lateral calf: 4cm x 2cm x 0.3cm  All of these wounds are clean and pink,  the right lateral with <20% fibrin Wound bed:see above  Drainage (amount, consistency, odor) minimal  Periwound: intact, hemosiderin staining Dressing procedure/placement/frequency: I have DC the use of enzymatic debridement since the wounds are clean.  I have switched her to silicone foam, this will allow the staff to protect the ulcers and promote moist wound healing.  She is quite combative with me today, kicking me several times and having to be redirected to not kick and bite me.  She has dementia and I am not clear if this is her baseline or not.  No family in the room.   Discussed POC with  bedside nurse.  Re consult if needed, will not follow at this time. Thanks  Silvino Selman [employer], CWOCN [phone])

## 2015-11-27 ENCOUNTER — Ambulatory Visit (HOSPITAL_COMMUNITY)
Admit: 2015-11-27 | Discharge: 2015-11-27 | Disposition: A | Discharge status: Home / Self Care | Payer: Medicare Other | Attending: Neurology | Admitting: Neurology

## 2015-11-27 ENCOUNTER — Inpatient Hospital Stay (HOSPITAL_COMMUNITY): Payer: Medicare Other

## 2015-11-27 LAB — BLOOD GAS, VENOUS
Acid-base deficit: 0.4 mmol/L (ref 0.0–2.0)
Bicarbonate: 25.1 mEq/L — ABNORMAL HIGH (ref 20.0–24.0)
O2 Content: 2 L/min
O2 SAT: 56 %
PATIENT TEMPERATURE: 98.6
TCO2: 23.3 mmol/L (ref 0–100)
pCO2, Ven: 47.5 mmHg (ref 45.0–50.0)
pH, Ven: 7.343 — ABNORMAL HIGH (ref 7.250–7.300)
pO2, Ven: 36.7 mmHg (ref 30.0–45.0)

## 2015-11-27 MED ORDER — LORAZEPAM 2 MG/ML IJ SOLN
1.0000 mg | Freq: Every day | INTRAMUSCULAR | Status: DC
  Administered 2015-11-27 – 2015-11-28 (×2): 1 mg via INTRAMUSCULAR
  Filled 2015-11-27 (×2): qty 1

## 2015-11-27 MED ORDER — POTASSIUM CHLORIDE 2 MEQ/ML IV SOLN
INTRAVENOUS | Status: DC
  Administered 2015-11-27: 19:00:00 via INTRAVENOUS
  Filled 2015-11-27 (×2): qty 1000

## 2015-11-27 MED ORDER — OLANZAPINE 10 MG IM SOLR
2.5000 mg | Freq: Two times a day (BID) | INTRAMUSCULAR | Status: DC
  Administered 2015-11-27 – 2015-12-07 (×19): 2.5 mg via INTRAMUSCULAR
  Filled 2015-11-27 (×25): qty 10

## 2015-11-27 NOTE — Progress Notes (Signed)
RN aware that lab draws VBG.

## 2015-11-27 NOTE — Progress Notes (Signed)
SLP Cancellation Note  Patient Details Name: [name] MRN: [id number] DOB: Aug 18, 1932   Cancelled treatment:       Reason Eval/Treat Not Completed: Other (comment) (pt agitated)   Donavan Burnet, MS [facility] SLP [phone]

## 2015-11-27 NOTE — Care Management Important Message (Signed)
Important Message  Patient Details  Name: [name] MRN: [id number] Date of Birth: Nov 23, 1932   Medicare Important Message Given:  Yes    Haskell Flirt 11/27/2015, 2:43 PMImportant Message  Patient Details  Name: [name] MRN: [id number] Date of Birth: 1932-10-23   Medicare Important Message Given:  Yes    Haskell Flirt 11/27/2015, 2:43 PM

## 2015-11-27 NOTE — Progress Notes (Signed)
PROGRESS NOTE    [name] YKZ:[id number] DOB: [birth year] DOA: 11/23/2015 PCP: Rogelia Boga, MD  HPI/Brief narrative 82 y.o. female , recent hospitalization 11/13/15-11/19/15 with diagnosis of acute encephalopathy, sepsis secondary to UTI, was discharged to SNF, returns to the [facility] ED on 11/23/15 with complaints of worsening confusion/combativeness and decreased oral intake. She has PMH of ASCVD, CABG, AAA repair, rheumatoid arthritis, chronic low back pain, chronic diastolic CHF, chronic lower extremity venous stasis ulcers, HTN.In the ED, lab work shows sodium 155, potassium 3.3, chloride 99, bicarbonate 38, BUN 47, creatinine 1, WBC 16.3, chest x-ray without acute abnormality and urine microscopy not indicative of UTI. Hospitalist admission was requested. Patient was hydrated and her sodium has normalized. Electrolytes were replaced. Despite all treatment, continues to be very confused with behavioral abnormalities and hallucinations. Psychiatric consulted on 1/2. As per family, prior to previous hospitalization, lived alone (vertigo), ambulated with help of a walker and was coherent with some memory impairment appropriate for her age. Refuses to take by mouth psychiatric medications. These have been switched to IM on 1/3. Neurology consulted to evaluate for neurological causes for her mental status change.   Assessment/Plan:  Dehydration with hypernatremia - Secondary to poor oral intake and diuretics. Diuretics were discontinued at the SNF on 12/27. - Hydrated gently with hypotonic IV fluids and over the course of 3 days, hypernatremia has resolved and dehydration has improved. - She is at risk for recurrent dehydration from poor oral intake. - Continue IV fluids due to fluctuating and poor oral intake. Periodically follow BMP.  Hypokalemia - Replaced intravenously but at risk for recurrent hypokalemia secondary to poor oral intake. Periodically follow  BMP.  Acute encephalopathy - Likely secondary to dehydration, hypernatremia complicating? Underlying dementia. No focal deficits on exam. No lab workup suggesting infectious source (chest x-ray negative and urine microscopy not suggestive of UTI) - Recent head CT 11/13/15: No acute intracranial abnormalities. - Patient combative, kicking, screaming, spitting at staff limiting care. Started when necessary low-dose IV Haldol after ensuring QTC normal. Psychiatric status discontinued IV Haldol on 1/3 because it was not helping. Patient refused to take oral medications. Psychiatry have started Zyprexa IM. - As per psychiatry's, treating for paranoid psychosis. - Requested neurology to evaluate for possible neurological causes for altered mental status. As per neurology, DD-exacerbation of underlying dementia from metabolic/infectious etiology. With history of cognitive decline and reported gait instability, would also need to consider neurodegenerative process suggest PSP. He is unable to fully examine patient due to agitation. Checking MRI brain, EEG. - Recent extensive workup including TSH, B12, RPR have been negative.  Essential hypertension - Continue metoprolol. DC HCTZ indefinitely. Controlled.  Dysphagia - Patient was recently seen by speech therapy on 12/23 and was placed on dysphagia 2 diet and nectar thickened liquids. Some of this may be secondary to dry mouth and tongue coating. She was also started on treatment for oral thrush with Diflucan at SNF-continue same. -Nursing to provide aggressive oral care.  - Speech therapy recommended thin liquids/full liquids on 11/26/15 - Patient not cooperating with any aspect of medical care.  Multiple bilateral lower extremity ulcers and stage II sacral decubitus ulcer  - Wound care consulted. Discussed with wound care team on 1/2. Wounds appear clean.  Chronic diastolic CHF -Compensated. Diuretics on hold  Malnutrition - Dietitian  consultation  Rheumatoid arthritis - Continue home medications  CAD  Recently treated UTI - Urine microscopy not impressive for another UTI.  Anemia - Dilutional.  Stable   DVT prophylaxis: Lovenox  Code Status:DO NOT RESUSCITATE-has out of facility sheet confirming this. Family Communication: Discussed with patient's son Mr. [name] on 11/26/2015. Disposition Plan: DC to SNF when medically stable.    Consultants:  Psychiatry  Neurology  Procedures:  None  Antibiotics:  None   Subjective: Continues to have behavioral issues and not cooperating with all aspects of medical care. Hallucinations +. As per RN, has new IV line.  Objective: Filed Vitals:   11/26/15 1421 11/26/15 2255 11/27/15 0653 11/27/15 1612  BP: 134/47 113/57 144/45 149/66  Pulse: 105 91 90 84  Temp: 97.7 F (36.5 C) 97.5 F (36.4 C) 97.6 F (36.4 C)   TempSrc: Axillary Axillary Axillary   Resp: 20 18 18 18   Weight:      SpO2: 99% 100% 100%     Intake/Output Summary (Last 24 hours) at 11/27/15 1739 Last data filed at 11/27/15 0600  Gross per 24 hour  Intake 466.67 ml  Output      0 ml  Net 466.67 ml   Filed Weights   11/25/15 0444 11/26/15 0628  Weight: 49.1 kg (108 lb 3.9 oz) 49.6 kg (109 lb 5.6 oz)     Exam:  General exam: Small built and cachectic chronically ill-looking elderly female lying comfortably supine in bed. Sleeping but arousable and states that she is "okay".  Respiratory system: Clear. No increased work of breathing. Cardiovascular system: S1 & S2 heard, RRR. No JVD, murmurs, gallops, clicks or pedal edema. Gastrointestinal system: Abdomen is nondistended, soft and nontender. Normal bowel sounds heard. Central nervous system: Somnolent but arousable. No focal neurological deficits. Extremities: Symmetric 5 x 5 power. Multiple leg ulcers.   Data Reviewed: Basic Metabolic Panel:  Recent Labs Lab 11/23/15 0750 11/24/15 0755 11/25/15 0910 11/26/15 0845   NA 155* 151* 147* 142  K 3.3* 2.9* 3.2* 4.6  CL 99* 105 104 103  CO2 38* 37* 33* 30  GLUCOSE 104* 119* 105* 89  BUN 47* 27* 18 13  CREATININE 1.00 0.62 0.62 0.54  CALCIUM 9.7 8.7* 8.4* 8.9  MG  --  2.5*  --   --    Liver Function Tests:  Recent Labs Lab 11/23/15 0750  AST 33  ALT 22  ALKPHOS 94  BILITOT 1.0  PROT 8.0  ALBUMIN 3.9   No results for input(s): LIPASE, AMYLASE in the last 168 hours. No results for input(s): AMMONIA in the last 168 hours. CBC:  Recent Labs Lab 11/23/15 0750 11/24/15 0755 11/25/15 0910 11/26/15 0845  WBC 16.3* 11.2* 7.7 7.0  NEUTROABS 13.5*  --   --   --   HGB 13.7 11.1* 9.8* 11.5*  HCT 44.5 36.9 32.4* 37.0  MCV 90.6 91.3 89.8 89.6  PLT 430* 341 306 359   Cardiac Enzymes: No results for input(s): CKTOTAL, CKMB, CKMBINDEX, TROPONINI in the last 168 hours. BNP (last 3 results) No results for input(s): PROBNP in the last 8760 hours. CBG: No results for input(s): GLUCAP in the last 168 hours.  No results found for this or any previous visit (from the past 240 hour(s)).       Studies: Mr Attempted 01/24/16 Report  11/27/2015  This examination belongs to an outside facility and is stored here for comparison purposes only.  Contact the originating outside institution for any associated report or interpretation.       Scheduled Meds: . aspirin EC  81 mg Oral Daily  . cholecalciferol  1,000 Units Oral Daily  .  enoxaparin (LOVENOX) injection  30 mg Subcutaneous Q24H  . feeding supplement (ENSURE ENLIVE)  237 mL Oral Q24H  . feeding supplement (PRO-STAT SUGAR FREE 64)  30 mL Oral TID  . fluconazole  100 mg Oral Daily  . folic acid  1 mg Oral BID  . hydroxychloroquine  200 mg Oral BID  . LORazepam  1 mg Intramuscular QHS  . [START ON 11/29/2015] methotrexate  15 mg Oral Weekly  . metoprolol succinate  50 mg Oral Daily  . multivitamin  5 mL Oral Daily  . OLANZapine  2.5 mg Intramuscular BID  . senna-docusate  1 tablet Oral BID   . simvastatin  40 mg Oral Daily   Continuous Infusions: . dextrose 5 % with kcl 50 mL/hr at 11/26/15 2040    Active Problems:   Essential hypertension   CORONARY ATHEROSCLEROSIS, ARTERY BYPASS GRAFT   Rheumatoid arthritis (HCC)   Pressure ulcer   Malnutrition of moderate degree   Acute encephalopathy   Chronic diastolic congestive heart failure (HCC)   Dehydration with hypernatremia   Hypernatremia   Other affective psychosis    Time spent: 30 minutes.    Marcellus Scott, MD, FACP, FHM. [facility] Pager [phone]  If 7PM-7AM, please contact night-coverage [email or web address] Password [email or web address] 11/27/2015, 5:39 PM    LOS: 3 days

## 2015-11-27 NOTE — Progress Notes (Signed)
OT Cancellation Note  Patient Details Name: [name] MRN: [id number] DOB: Jan 24, 1932   Cancelled Treatment:    Reason Eval/Treat Not Completed: Patient's level of consciousness. RN reports patient is too lethargic to work with therapy at this time. Will try again as schedule allows for OT eval.   Edwin Cap , MS, OTR/L, CLT Pager: [phone]   11/27/2015, 1:31 PM

## 2015-11-27 NOTE — Progress Notes (Signed)
Physical Therapy Treatment Patient Details Name: [name] MRN: [id number] DOB: [birth year] Today's Date: 11/27/2015    History of Present Illness 82 yo female admitted with dehydration, hypernatremia. Hx of RA, CABG, chronic low back pain.    PT Comments    Pt was lethargic at start of session. Sat pt at EOB with +2 assist. Max assist to maintain sitting balance. Improved alertness as time sitting EOB progressed. Sat for a total of ~5 minutes. Noted increase in anxiety as pt became more alert. Assisted pt back to supine. Recommend return to SNF.   Follow Up Recommendations  SNF     Equipment Recommendations  None recommended by PT    Recommendations for Other Services       Precautions / Restrictions Precautions Precautions: Fall Precaution Comments: can be combative per chart notes Restrictions Weight Bearing Restrictions: No    Mobility  Bed Mobility Overal bed mobility: Needs Assistance Bed Mobility: Supine to Sit;Sit to Supine     Supine to sit: Total assist;+2 for physical assistance;HOB elevated;+2 for safety/equipment Sit to supine: Total assist;+2 for physical assistance;+2 for safety/equipment   General bed mobility comments: Assist for trunk and bil Les. Utilized bed pad. Increased time. Very little assist, if any, from pt  Transfers                 General transfer comment: NT-pt unable at this time  Ambulation/Gait                 Stairs            Wheelchair Mobility    Modified Rankin (Stroke Patients Only)       Balance   Sitting-balance support: Feet supported;No upper extremity supported Sitting balance-Leahy Scale: Zero Sitting balance - Comments: Max assist for static sitting balance. Pt did not attempt to use UEs to assist with balance, despite cues from therapist.                             Cognition Arousal/Alertness: Lethargic;Suspect due to medications Behavior During Therapy:  (once alert)    Area of Impairment: Orientation;Attention;Memory;Following commands;Safety/judgement;Problem solving Orientation Level: Disoriented to;Place;Time;Situation Current Attention Level: Focused Memory: Decreased short-term memory Following Commands: Follows one step commands inconsistently Safety/Judgement: Decreased awareness of deficits;Decreased awareness of safety          Exercises      General Comments        Pertinent Vitals/Pain Faces Pain Scale: No hurt    Home Living                      Prior Function            PT Goals (current goals can now be found in the care plan section) Progress towards PT goals: Progressing toward goals (very slowly)    Frequency  Min 2X/week    PT Plan Current plan remains appropriate    Co-evaluation             End of Session   Activity Tolerance: Patient tolerated treatment well Patient left: in bed;with call bell/phone within reach;with bed alarm set;with restraints reapplied     Time: 4742-5956 PT Time Calculation (min) (ACUTE ONLY): 14 min  Charges:  $Therapeutic Activity: 8-22 mins                    G Codes:      Rebeca Alert,  MPT Pager: [phone]

## 2015-11-27 NOTE — Consult Note (Signed)
[facility] Consult follow-up  Reason for Consult:  Confused and combative with staff and AMS Referring Physician:  Dr. Algis Liming Patient Identification: [name] MRN:  [id number] Principal Diagnosis: <principal problem not specified> Diagnosis:   Patient Active Problem List   Diagnosis Date Noted  . Hypernatremia [E87.0] 11/24/2015  . Dehydration with hypernatremia [E87.0] 11/23/2015  . Pressure ulcer [L89.90] 11/14/2015  . Malnutrition of moderate degree [E44.0] 11/14/2015  . Acute encephalopathy [G93.40] 11/14/2015  . Pancytopenia ([place]) [B15.176] 11/14/2015  . Transaminitis [R74.0] 11/14/2015  . Chronic diastolic congestive heart failure ([place]) [I50.32] 11/14/2015  . Chronic cutaneous venous stasis ulcer ([place]) [I83.009] 11/14/2015  . Rheumatoid arthritis ([place]) [M06.9] 11/13/2015  . Hereditary and idiopathic peripheral neuropathy [G60.9] 05/17/2015  . History of aortic aneurysm [Z86.79]   . CORONARY ATHEROSCLEROSIS, ARTERY BYPASS GRAFT [I25.810] 08/23/2008  . Essential hypertension [I10] 05/11/2008  . FIBROCYSTIC BREAST DISEASE, HX OF [Z87.898] 05/21/2007    Total Time spent with patient: 30 minutes  Subjective:   [name] is a 82 y.o. female patient admitted with AMS.  HPI:  [name] is a 82 y.o. female, recent resident of SNF admitted to [facility] for increased symptoms of worsening confusion, emotion instability and combativeness with staff. patient has been frustrated, upset because no one believe her. She is poor historian and has no family at bed side. She has been suffering with loss of singificant cognitions. She does not know why she was in hospital and her current medical or emotional problems. She knows her name, age and being hospitalized. She complaints about not able to eat, drink and sleep better. She denied suicide or homicide ideation, intention or plans. Will ask unit social service to contact SNF and family contact regarding her  baseline functioning. She has a walker in her room and has multiple laceration on her legs and hands.   Medical history: She was recent hospitalization 11/13/15-11/19/15 with diagnosis of acute encephalopathy, sepsis secondary to UTI, was discharged to SNF, [facility] on 11/23/15 with complaints of worsening confusion/combativeness and decreased oral intake. She has PMH of ASCVD, CABG, AAA repair, rheumatoid arthritis, chronic low back pain, chronic diastolic CHF, chronic lower extremity venous stasis ulcers, HTN. Patient is confused and not a reliable historian. As per ED staff, patient was spitting at them. As per report, she has been refusing to take her medications, increasingly combative towards staff which has limited her care at the SNF. SNF thereby sent her to the hospital for IV therapy. In the ED, lab work shows sodium 155, potassium 3.3, chloride 99, bicarbonate 38, BUN 47, creatinine 1, WBC 16.3, chest x-ray without acute abnormality and urine microscopy not indicative of UTI. Hospitalist admission was requested.  Interval history: Patient seen for psychiatric consultation follow-up for paranoid psychosis and altered mental status. Patient continued to be psychotic, paranoid, irritable, agitated and respond with loud "no". Patient reportedly not able to eat, drink or sleep. Patient is also feeling like he is getting frustrated and upset. Patient denies active suicidal/homicidal ideation. We provide Zyprexa 2.5 mg IM twice daily and also will increase lorazepam to 1 mg at bedtime for better control of insomnia and agitation.  Past Psychiatric History: None reported  Risk to Self: Is patient at risk for suicide?: No Risk to Others:   Prior Inpatient Therapy:   Prior Outpatient Therapy:    Past Medical History:  Past Medical History  Diagnosis Date  . DEPRESSION 05/21/2007  . FIBROCYSTIC  BREAST DISEASE, HX OF 05/21/2007  . HYPERLIPIDEMIA-MIXED 11/26/2008    takes  Simvastatin daily  . Irritable bowel syndrome 05/21/2007  . KNEE PAIN 09/16/2007  . LEG EDEMA, BILATERAL 05/21/2009  . OSTEOPENIA 05/21/2007  . URTICARIA, CHRONIC 05/21/2007  . History of aortic aneurysm     resection and grafting of a 5.2-cm ascending aortic arch aneurysm August 2009  . Dyslipidemia   . Vitamin D deficiency   . Urticaria   . Neuropathy (HCC)     left radial  neuropathy with wrist drop  . Diverticulosis   . Irritable bowel syndrome   . GERD (gastroesophageal reflux disease)   . 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 Vaginal yeast infection     recently told to use monistat, hasn't started as of 01/26/2012  . HYPERTENSION 05/11/2008    takes Metoprolol and HCTZ daily  . Asthma     hx of;as a child  . Seasonal allergies     takes Hydroxyzine prn and Zyrtec 2 times a week  . Peripheral edema     takes Furosemide prn  . Pneumonia     history of;in the 90's  . Weakness     hands and feet  . Sciatica   . OSTEOARTHRITIS 01/20/2008    takes Methotrexate weekly  . DJD (degenerative joint disease)     history of   . Rheumatoid arthritis(714.0)   . Chronic back pain     takes Norco prn  . Bruises easily     takes Plaquenil daily  . CORONARY ATHEROSCLEROSIS, ARTERY BYPASS GRAFT 08/23/2008  . CAD (coronary artery disease)     s/p CABG -  x 1 with LIMA to LAD August 2009 /   . Urinary incontinence   . Bowel incontinence   . History of colon polyps   . Urinary frequency   . Urinary urgency   . Nocturia   . Anemia     takes folic acid bid  . Urinary incontinence     wears depends  . Hereditary and idiopathic peripheral neuropathy 05/17/2015    Past Surgical History  Procedure Laterality Date  . Appendectomy    . Cesarean section  1968    x 1  . Hernia repair    . Coronary artery bypass graft  August 2009    CABG x 1 with LIMA to LAD /  . Ascending aortic aneurysm repair  August 2009     resection and grafting of a 5.2-cm ascending aortic arch  aneurysm  . Joint replacement      2009 & 2010- both knees  . Back surgery      2011- cerv. fusion   . Breast surgery      biopsy x2- R breast  . Tonsillectomy      as a child  . Cardiac catheterization  2009  . Colonoscopy    . Lumbar laminectomy/decompression microdiscectomy Bilateral 07/11/2013    Procedure: LUMBAR LAMINECTOMY/DECOMPRESSION MICRODISCECTOMY LUMBAR FIVE SACRAL ONE;  Surgeon: Elaina Hoops, MD;  Location: [facility];  Service: Neurosurgery;  Laterality: Bilateral;   Family History:  Family History  Problem Relation Age of Onset  . Heart disease    . Coronary artery disease    . Anesthesia problems Neg Hx   . Hypotension Neg Hx   . Malignant hyperthermia Neg Hx   . Pseudochol deficiency Neg Hx   . Heart disease Mother   . Stroke Father   .  Cancer Maternal Grandmother   . Stroke Paternal Grandmother    Family Psychiatric  History: unknown Social History:  History  Alcohol Use No     History  Drug Use No    Social History   Social History  . Marital Status: Widowed    Spouse Name: N/A  . Number of Children: 2  . Years of Education: N/A   Occupational History  . retired    Social History Main Topics  . Smoking status: Never Smoker   . Smokeless tobacco: Never Used  . Alcohol Use: No  . Drug Use: No  . Sexual Activity: No   Other Topics Concern  . None   Social History Narrative   Patient is right handed.   Patient drinks a little caffeine daily.   Additional Social History:                          Allergies:   Allergies  Allergen Reactions  . Amoxicillin     REACTION: unspecified  . Cyclobenzaprine Hcl     REACTION: rash  . Mupirocin     Unknown reaction  . Penicillins Hives  . Tetanus Toxoid Hives    Labs:  Results for orders placed or performed during the hospital encounter of 11/23/15 (from the past 48 hour(s))  CBC     Status: Abnormal   Collection Time: 11/26/15  8:45 AM  Result Value Ref Range   WBC 7.0 4.0  - 10.5 K/uL   RBC 4.13 3.87 - 5.11 MIL/uL   Hemoglobin 11.5 (L) 12.0 - 15.0 g/dL   HCT 37.0 36.0 - 46.0 %   MCV 89.6 78.0 - 100.0 fL   MCH 27.8 26.0 - 34.0 pg   MCHC 31.1 30.0 - 36.0 g/dL   RDW 16.6 (H) 11.5 - 15.5 %   Platelets 359 150 - 400 K/uL  Basic metabolic panel     Status: None   Collection Time: 11/26/15  8:45 AM  Result Value Ref Range   Sodium 142 135 - 145 mmol/L   Potassium 4.6 3.5 - 5.1 mmol/L    Comment: DELTA CHECK NOTED REPEATED TO VERIFY NO VISIBLE HEMOLYSIS    Chloride 103 101 - 111 mmol/L   CO2 30 22 - 32 mmol/L   Glucose, Bld 89 65 - 99 mg/dL   BUN 13 6 - 20 mg/dL   Creatinine, Ser 0.54 0.44 - 1.00 mg/dL   Calcium 8.9 8.9 - 10.3 mg/dL   GFR calc non Af Amer >60 >60 mL/min   GFR calc Af Amer >60 >60 mL/min    Comment: (NOTE) The eGFR has been calculated using the CKD EPI equation. This calculation has not been validated in all clinical situations. eGFR's persistently <60 mL/min signify possible Chronic Kidney Disease.    Anion gap 9 5 - 15  Blood gas, venous     Status: Abnormal   Collection Time: 11/27/15 11:30 AM  Result Value Ref Range   O2 Content 2.0 L/min   Delivery systems NASAL CANNULA    pH, Ven 7.343 (H) 7.250 - 7.300   pCO2, Ven 47.5 45.0 - 50.0 mmHg   pO2, Ven 36.7 30.0 - 45.0 mmHg   Bicarbonate 25.1 (H) 20.0 - 24.0 mEq/L   TCO2 23.3 0 - 100 mmol/L   Acid-base deficit 0.4 0.0 - 2.0 mmol/L   O2 Saturation 56.0 %   Patient temperature 98.6    Collection site VEIN  Drawn by [place] RN    Sample type VEIN     Current Facility-Administered Medications  Medication Dose Route Frequency Provider Last Rate Last Dose  . acetaminophen (TYLENOL) tablet 650 mg  650 mg Oral Q6H PRN Modena Jansky, MD       Or  . acetaminophen (TYLENOL) suppository 650 mg  650 mg Rectal Q6H PRN Modena Jansky, MD      . albuterol (PROVENTIL) (2.5 MG/3ML) 0.083% nebulizer solution 2.5 mg  2.5 mg Nebulization Q2H PRN Modena Jansky, MD      . aspirin  EC tablet 81 mg  81 mg Oral Daily Modena Jansky, MD   81 mg at 11/23/15 1619  . cholecalciferol (VITAMIN D) tablet 1,000 Units  1,000 Units Oral Daily Modena Jansky, MD   1,000 Units at 11/23/15 1619  . dextrose 5 % 1,000 mL with potassium chloride 40 mEq infusion   Intravenous Continuous Modena Jansky, MD 50 mL/hr at 11/26/15 2040    . enoxaparin (LOVENOX) injection 30 mg  30 mg Subcutaneous Q24H Modena Jansky, MD   30 mg at 11/24/15 1658  . feeding supplement (ENSURE ENLIVE) (ENSURE ENLIVE) liquid 237 mL  237 mL Oral Q24H Clayton Bibles, RD   237 mL at 11/24/15 1508  . feeding supplement (PRO-STAT SUGAR FREE 64) liquid 30 mL  30 mL Oral TID Clayton Bibles, RD   30 mL at 11/24/15 1655  . fluconazole (DIFLUCAN) tablet 100 mg  100 mg Oral Daily Modena Jansky, MD   100 mg at 11/23/15 1619  . folic acid (FOLVITE) tablet 1 mg  1 mg Oral BID Modena Jansky, MD   1 mg at 11/23/15 1619  . hydroxychloroquine (PLAQUENIL) tablet 200 mg  200 mg Oral BID Modena Jansky, MD   200 mg at 11/23/15 1619  . LORazepam (ATIVAN) injection 1 mg  1 mg Intramuscular QHS Ambrose Finland, MD      . Derrill Memo ON 11/29/2015] methotrexate (RHEUMATREX) tablet 15 mg  15 mg Oral Weekly Modena Jansky, MD      . metoprolol succinate (TOPROL-XL) 24 hr tablet 50 mg  50 mg Oral Daily Modena Jansky, MD   50 mg at 11/23/15 1619  . multivitamin liquid 5 mL  5 mL Oral Daily Clayton Bibles, RD   5 mL at 11/24/15 1509  . OLANZapine (ZYPREXA) injection 2.5 mg  2.5 mg Intramuscular BID Ambrose Finland, MD      . senna-docusate (Senokot-S) tablet 1 tablet  1 tablet Oral BID Modena Jansky, MD   1 tablet at 11/23/15 1626  . simvastatin (ZOCOR) tablet 40 mg  40 mg Oral Daily Modena Jansky, MD   40 mg at 11/23/15 1619    Musculoskeletal: Strength & Muscle Tone: decreased Gait & Station: unable to stand Patient leans: N/A  Psychiatric Specialty Exam: ROS complained poor sleep and not able to eat and  drink.  No Fever-chills, No Headache, No changes with Vision or hearing, reports vertigo No problems swallowing food or Liquids, No Chest pain, Cough or Shortness of Breath, No Abdominal pain, No Nausea or Vommitting, Bowel movements are regular, No Blood in stool or Urine, No dysuria, No new skin rashes or bruises, No new joints pains-aches,  No new weakness, tingling, numbness in any extremity, No recent weight gain or loss, No polyuria, polydypsia or polyphagia,   A full 10 point Review of Systems was done, except as stated above, all other  Review of Systems were negative.  Blood pressure 149/66, pulse 84, temperature 97.6 F (36.4 C), temperature source Axillary, resp. rate 18, weight 49.6 kg (109 lb 5.6 oz), SpO2 100 %.Body mass index is 19.99 kg/(m^2).  General Appearance: Bizarre and Guarded  Eye Contact::  Poor  Speech:  Clear and Coherent and Slow  Volume:  Decreased  Mood:  Anxious, Depressed and Irritable  Affect:  Depressed and Tearful  Thought Process:  Disorganized and Irrelevant  Orientation:  Other:  to her name, friend name, age and being hospitalized only.   Thought Content:  Rumination and agitation  Suicidal Thoughts:  No  Homicidal Thoughts:  No  Memory:  Immediate;   Poor Recent;   Poor  Judgement:  Impaired  Insight:  Shallow  Psychomotor Activity:  Decreased  Concentration:  Fair  Recall:  [employer] of Knowledge:Fair  Language: Fair  Akathisia:  Negative  Handed:  Right  AIMS (if indicated):     Assets:  Communication Skills Desire for Improvement Financial Resources/Insurance Leisure Time Resilience Social Support  ADL's:  Impaired  Cognition: Impaired,  Mild  Sleep:      Treatment Plan Summary: Daily contact with patient to assess and evaluate symptoms and progress in treatment and Medication management  Discontinue Haldol which is not helping much, Risperidone M- tab 0.5 mg and remeron sol tab as patient was not able to take by  mouth We start Zyprexa 2.5 mg twice daily for paranoid psychosis     Disposition: Patient does not meet criteria for psychiatric inpatient admission. Supportive therapy provided about ongoing stressors.  Patient will be referred to SNF when medically stable  General Wearing,JANARDHAHA R. 11/27/2015 5:13 PM

## 2015-11-27 NOTE — Consult Note (Addendum)
Consult Reason for Consult: altered mental status Referring Physician: Dr Algis Liming  CC: altered mental status  HPI: [name] is an 82 y.o. female hx of depression, HLD, HTN, CHF, CABG presenting from SNF with increased confusion and agitation. History via chart as patient poor historian and no family available. She was initially admitted from 12/20 to 1226 with encephalopathy and sepsis secondary to UTI. Returned to ED on 12/30 with increased confusion/combativeness. In ED noted to have Na of 155, BUN 47 and WBC of 16.3. Current WBC today is 7 and she is afebrile. Na trending down to 142 from 147 2 days ago.  At prior admission she had an unremarkable TSH, ammonia, B12, ESR, RPR, HIV.   While in the hospital she has been receiving PRN Haldol which improves her agitation temporarily. CT head imaging from 12/20 reviewed and no acute process noted.   Patient has been evaluated by Dr Jannifer Franklin of [facility] in the past for progressive gait instability of unclear etiology.   Past Medical History  Diagnosis Date  . DEPRESSION 05/21/2007  . FIBROCYSTIC BREAST DISEASE, HX OF 05/21/2007  . HYPERLIPIDEMIA-MIXED 11/26/2008    takes Simvastatin daily  . Irritable bowel syndrome 05/21/2007  . KNEE PAIN 09/16/2007  . LEG EDEMA, BILATERAL 05/21/2009  . OSTEOPENIA 05/21/2007  . URTICARIA, CHRONIC 05/21/2007  . History of aortic aneurysm     resection and grafting of a 5.2-cm ascending aortic arch aneurysm August 2009  . Dyslipidemia   . Vitamin D deficiency   . Urticaria   . Neuropathy (HCC)     left radial  neuropathy with wrist drop  . Diverticulosis   . Irritable bowel syndrome   . GERD (gastroesophageal reflux disease)   . 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 Vaginal yeast infection     recently told to use monistat, hasn't started as of 01/26/2012  . HYPERTENSION 05/11/2008    takes Metoprolol and HCTZ daily  . Asthma     hx of;as a child  . Seasonal allergies     takes Hydroxyzine prn  and Zyrtec 2 times a week  . Peripheral edema     takes Furosemide prn  . Pneumonia     history of;in the 90's  . Weakness     hands and feet  . Sciatica   . OSTEOARTHRITIS 01/20/2008    takes Methotrexate weekly  . DJD (degenerative joint disease)     history of   . Rheumatoid arthritis(714.0)   . Chronic back pain     takes Norco prn  . Bruises easily     takes Plaquenil daily  . CORONARY ATHEROSCLEROSIS, ARTERY BYPASS GRAFT 08/23/2008  . CAD (coronary artery disease)     s/p CABG -  x 1 with LIMA to LAD August 2009 /   . Urinary incontinence   . Bowel incontinence   . History of colon polyps   . Urinary frequency   . Urinary urgency   . Nocturia   . Anemia     takes folic acid bid  . Urinary incontinence     wears depends  . Hereditary and idiopathic peripheral neuropathy 05/17/2015    Past Surgical History  Procedure Laterality Date  . Appendectomy    . Cesarean section  1968    x 1  . Hernia repair    . Coronary artery bypass graft  August 2009    CABG x 1 with LIMA to LAD /  .  Ascending aortic aneurysm repair  August 2009     resection and grafting of a 5.2-cm ascending aortic arch aneurysm  . Joint replacement      2009 & 2010- both knees  . Back surgery      2011- cerv. fusion   . Breast surgery      biopsy x2- R breast  . Tonsillectomy      as a child  . Cardiac catheterization  2009  . Colonoscopy    . Lumbar laminectomy/decompression microdiscectomy Bilateral 07/11/2013    Procedure: LUMBAR LAMINECTOMY/DECOMPRESSION MICRODISCECTOMY LUMBAR FIVE SACRAL ONE;  Surgeon: Elaina Hoops, MD;  Location: [facility];  Service: Neurosurgery;  Laterality: Bilateral;    Family History  Problem Relation Age of Onset  . Heart disease    . Coronary artery disease    . Anesthesia problems Neg Hx   . Hypotension Neg Hx   . Malignant hyperthermia Neg Hx   . Pseudochol deficiency Neg Hx   . Heart disease Mother   . Stroke Father   . Cancer Maternal Grandmother    . Stroke Paternal Grandmother     Social History:  reports that she has never smoked. She has never used smokeless tobacco. She reports that she does not drink alcohol or use illicit drugs.  Allergies  Allergen Reactions  . Amoxicillin     REACTION: unspecified  . Cyclobenzaprine Hcl     REACTION: rash  . Mupirocin     Unknown reaction  . Penicillins Hives  . Tetanus Toxoid Hives    Medications: I have reviewed the patient's current medications.  ROS: Out of a complete 14 system review, the patient complains of only the following symptoms, and all other reviewed systems are negative. +agitation  Physical Examination: Filed Vitals:   11/26/15 2255 11/27/15 0653  BP: 113/57 144/45  Pulse: 91 90  Temp: 97.5 F (36.4 C) 97.6 F (36.4 C)  Resp: 18 18   Physical Exam  Constitutional: He appears well-developed and well-nourished.  Psych: Affect appropriate to situation Eyes: No scleral injection HENT: No OP obstrucion Head: Normocephalic.  Cardiovascular: Normal rate and regular rhythm.  Respiratory: Effort normal and breath sounds normal.  GI: Soft. Bowel sounds are normal. No distension. There is no tenderness.  Skin: WDI  Neurologic Examination Mental Status: Alert, oriented x 3 though frequent nonsensical speech mixed with her answers.  Speech fluent without evidence of aphasia. No dysarthria. Refuses to answer most questions or follow most commands. Cranial Nerves: II: unable to visualize optic discs. PERRL. Refuses to track finger though appears to move fully to R and L. Unable to test vertical gaze III,IV, VI: ptosis not present, see above for EOM V,VII: face symmetric, refuses to answer facial sensation  VIII: hearing normal bilaterally IX,X: cough present XI: trapezius strength/neck flexion strength normal bilaterally XII: tongue strength normal  Motor: Not following commands, moves LUE against gravity and light resistance. Appears to move RUE against  gravity and light resistance though less briskly compared to left. Lifts bilateral LE against gravity Unable to assess tone due to refusal  Sensory: LT intact in all extremities Deep Tendon Reflexes: 2+ and symmetric throughout Plantars: Right: downgoing   Left: downgoing Cerebellar: Patient refusing  Gait: deferred  Laboratory Studies:   Basic Metabolic Panel:  Recent Labs Lab 11/23/15 0750 11/24/15 0755 11/25/15 0910 11/26/15 0845  NA 155* 151* 147* 142  K 3.3* 2.9* 3.2* 4.6  CL 99* 105 104 103  CO2 38* 37* 33*  30  GLUCOSE 104* 119* 105* 89  BUN 47* 27* 18 13  CREATININE 1.00 0.62 0.62 0.54  CALCIUM 9.7 8.7* 8.4* 8.9  MG  --  2.5*  --   --     Liver Function Tests:  Recent Labs Lab 11/23/15 0750  AST 33  ALT 22  ALKPHOS 94  BILITOT 1.0  PROT 8.0  ALBUMIN 3.9   No results for input(s): LIPASE, AMYLASE in the last 168 hours. No results for input(s): AMMONIA in the last 168 hours.  CBC:  Recent Labs Lab 11/23/15 0750 11/24/15 0755 11/25/15 0910 11/26/15 0845  WBC 16.3* 11.2* 7.7 7.0  NEUTROABS 13.5*  --   --   --   HGB 13.7 11.1* 9.8* 11.5*  HCT 44.5 36.9 32.4* 37.0  MCV 90.6 91.3 89.8 89.6  PLT 430* 341 306 359    Cardiac Enzymes: No results for input(s): CKTOTAL, CKMB, CKMBINDEX, TROPONINI in the last 168 hours.  BNP: Invalid input(s): POCBNP  CBG: No results for input(s): GLUCAP in the last 168 hours.  Microbiology: Results for orders placed or performed during the hospital encounter of 11/13/15  Urine culture     Status: None   Collection Time: 11/13/15  6:25 PM  Result Value Ref Range Status   Specimen Description URINE, CATHETERIZED  Final   Special Requests Normal  Final   Culture   Final    >=100,000 COLONIES/mL PROTEUS MIRABILIS Performed at [facility]    Report Status 11/17/2015 FINAL  Final   Organism ID, Bacteria PROTEUS MIRABILIS  Final      Susceptibility   Proteus mirabilis - MIC*    AMPICILLIN <=2 SENSITIVE  Sensitive     CEFAZOLIN <=4 SENSITIVE Sensitive     CEFTRIAXONE <=1 SENSITIVE Sensitive     CIPROFLOXACIN <=0.25 SENSITIVE Sensitive     GENTAMICIN <=1 SENSITIVE Sensitive     IMIPENEM 0.5 SENSITIVE Sensitive     NITROFURANTOIN 64 RESISTANT Resistant     TRIMETH/SULFA <=20 SENSITIVE Sensitive     AMPICILLIN/SULBACTAM <=2 SENSITIVE Sensitive     PIP/TAZO <=4 SENSITIVE Sensitive     * >=100,000 COLONIES/mL PROTEUS MIRABILIS  Culture, blood (routine x 2)     Status: None   Collection Time: 11/13/15  8:43 PM  Result Value Ref Range Status   Specimen Description BLOOD LEFT HAND  Final   Special Requests IN PEDIATRIC BOTTLE [place]  Final   Culture   Final    NO GROWTH 5 DAYS Performed at [facility]    Report Status 11/18/2015 FINAL  Final  Culture, blood (routine x 2)     Status: None   Collection Time: 11/13/15 10:25 PM  Result Value Ref Range Status   Specimen Description BLOOD LEFT ANTECUBITAL  Final   Special Requests IN PEDIATRIC BOTTLE 2ML  Final   Culture   Final    NO GROWTH 5 DAYS Performed at [facility]    Report Status 11/18/2015 FINAL  Final    Coagulation Studies: No results for input(s): LABPROT, INR in the last 72 hours.  Urinalysis:  Recent Labs Lab 11/23/15 0803  COLORURINE YELLOW  LABSPEC 1.022  PHURINE 5.0  GLUCOSEU NEGATIVE  HGBUR NEGATIVE  BILIRUBINUR SMALL*  KETONESUR NEGATIVE  PROTEINUR NEGATIVE  NITRITE NEGATIVE  LEUKOCYTESUR NEGATIVE    Lipid Panel:     Component Value Date/Time   CHOL 120* 09/14/2015 1139   TRIG 47 09/14/2015 1139   HDL 63 09/14/2015 1139   CHOLHDL  1.9 09/14/2015 1139   VLDL 9 09/14/2015 1139   LDLCALC 48 09/14/2015 1139    HgbA1C: No results found for: HGBA1C  Urine Drug Screen:  No results found for: LABOPIA, COCAINSCRNUR, LABBENZ, AMPHETMU, THCU, LABBARB  Alcohol Level: No results for input(s): ETH in the last 168 hours.  Other results:  Imaging: No results found.   Assessment/Plan 83y/o  woman SNF resident with hx of HLD, CHF, HTN presenting with increased agitation and confusion. Upon arrival to ED noted to have a Na of 155 with a leukocytosis. Unclear what her true baseline is. Differential of current presentation includes exacerbation of underlying dementia from metabolic/infectious etiology. With history of cognitive decline and reported gait instability would also need to consider a neurodegenerative process, such as PSP. Unable to fully exam patient due to agitation, will re-evaluate further in the future.   -check MRI brain -EEG -venous blood gas -will follow up once above completed   Jim Like, DO [facility] [phone]  If 7pm- 7am, please page neurology on call as listed in AMION. 11/27/2015, 10:41 AM

## 2015-11-28 ENCOUNTER — Other Ambulatory Visit (HOSPITAL_COMMUNITY): Payer: Self-pay

## 2015-11-28 ENCOUNTER — Inpatient Hospital Stay (HOSPITAL_COMMUNITY): Payer: Medicare Other

## 2015-11-28 DIAGNOSIS — F22 Delusional disorders: Secondary | ICD-10-CM | POA: Diagnosis present

## 2015-11-28 LAB — URINALYSIS, ROUTINE W REFLEX MICROSCOPIC
Bilirubin Urine: NEGATIVE
GLUCOSE, UA: NEGATIVE mg/dL
Ketones, ur: NEGATIVE mg/dL
Nitrite: NEGATIVE
PROTEIN: NEGATIVE mg/dL
Specific Gravity, Urine: 1.011 (ref 1.005–1.030)
pH: 6 (ref 5.0–8.0)

## 2015-11-28 LAB — BASIC METABOLIC PANEL
ANION GAP: 7 (ref 5–15)
BUN: 9 mg/dL (ref 6–20)
CALCIUM: 9.5 mg/dL (ref 8.9–10.3)
CO2: 31 mmol/L (ref 22–32)
CREATININE: 0.51 mg/dL (ref 0.44–1.00)
Chloride: 102 mmol/L (ref 101–111)
GFR calc Af Amer: >60 mL/min (ref >60)
GLUCOSE: 99 mg/dL (ref 65–99)
Potassium: 5 mmol/L (ref 3.5–5.1)
Sodium: 140 mmol/L (ref 135–145)

## 2015-11-28 LAB — CBC
HCT: 35.8 % — ABNORMAL LOW (ref 36.0–46.0)
HEMOGLOBIN: 11.3 g/dL — AB (ref 12.0–15.0)
MCH: 27.5 pg (ref 26.0–34.0)
MCHC: 31.6 g/dL (ref 30.0–36.0)
MCV: 87.1 fL (ref 78.0–100.0)
Platelets: 296 10*3/uL (ref 150–400)
RBC: 4.11 MIL/uL (ref 3.87–5.11)
RDW: 16.1 % — AB (ref 11.5–15.5)
WBC: 4.4 10*3/uL (ref 4.0–10.5)

## 2015-11-28 LAB — URINE MICROSCOPIC-ADD ON

## 2015-11-28 LAB — LACTIC ACID, PLASMA: LACTIC ACID, VENOUS: 0.9 mmol/L (ref 0.5–2.0)

## 2015-11-28 MED ORDER — SODIUM CHLORIDE 0.9 % IV SOLN
INTRAVENOUS | Status: DC
  Administered 2015-11-28 – 2015-11-30 (×3): via INTRAVENOUS

## 2015-11-28 MED ORDER — LORAZEPAM 2 MG/ML IJ SOLN
1.0000 mg | Freq: Four times a day (QID) | INTRAMUSCULAR | Status: DC | PRN
  Administered 2015-11-28 (×2): 1 mg via INTRAMUSCULAR
  Filled 2015-11-28 (×2): qty 1

## 2015-11-28 NOTE — Progress Notes (Addendum)
PROGRESS NOTE  [name] EXB:[id number] DOB: December 09, 1931 DOA: 11/23/2015 PCP: Rogelia Boga, MD  Summary: 82 year old woman with recent hospitalization for sepsis secondary to UTI, discharge to skilled nursing facility presented 12/30 with increasing confusion, agitation, decreased oral intake, refusing to take medications, combativeness at skilled nursing facility. She was admitted for dehydration with hypernatremia, acute encephalopathy. She was hydrated, sodium normalized, electrolytes were placed. Despite this, remained confused with behavioral abnormalities and hallucinations. Psychiatry was consulted the patient was started on risperidone for paranoia and Remeron for insomnia. Felt to be appropriate for skilled nursing facility transfer when medically stable. Patient refused to participate in care, refused medications which were changed to parenteral administration. Neurology  consulted. Patient could not tolerate MRI or EEG secondary to agitation.  Assessment/Plan: 1. Paranoid psychosis, acute encephalopathy, persistent. Initially thought to be related to hypernatremia. Includes hallucinations, remains combative, kicking, screaming, spitting at staff. Haldol discontinued. Patient refusing oral medications. Being treated for acute psychosis. TSH, B12, RPR unremarkable. Current medications include Zyprexa IM twice a day, Ativan 1 mg by mouth daily at bedtime as needed. Unable to obtain MRI or EEG. Neurology pursuing further evaluation to assess for possible neurodegenerative process. 2. Mild hypothermia, likely environmental; vitals stable, suspect hypoxia is artifactual. 3. Possible acute hypoxic respiratory failure. CXR NAD. Difficult to obtain sat given agitation. Likely spurious. 4. Dehydration with hypernatremia. Resolved. Thought secondary to oral intake and diuretics. Discontinue hydrochlorothiazide indefinitely. 5. PMH ASCVD status post AAA repair and CABG; RA; chronic  diastolic CHF, chronic venous stasis ulcers, pancytopenia  6. Dysphagia. Full liquids. 7. Multiple bilateral LE ulcers present on admission. Excellent BLE pulses. 8. Dementia with behavioral disturbance. 9. Progressive gait instability of unclear etiology, peripheral neuropathy. 10. Discharged 12/26: acute encephalopathy, UTI with sepsis   Per nursing familiar with patient, no change in psychosis or belligerent behavior. Hemodynamics stable. Will check lactate and CBC but sepsis not suspected. Hypoxia likely artifactual.   Serial temps, warm blankets; consider active rewarming if fails to improve.  Trend SpO2, if doesn't wean will reassess. CXR clear.  CBC and BMP in AM  Will d/w psychiatry, will need further assistance; until psychosis has stabilized further workup will be limited by agitation. Continue Zyprexa. Add Ativan. Haldol per chart was not effective.  ADDENDUM Much improved; resting with Ativan. Hypothermia resolved. Labs pending but no reason to suspect sepsis. Weaning oxygen  Code Status: DNR DVT prophylaxis: TED hose Family Communication: none present Disposition Plan: SNF when stable.  Brendia Sacks, MD  [facility]  Pager [phone] If 7PM-7AM, please contact night-coverage at [email or web address], password [facility] 11/28/2015, 3:47 PM  LOS: 4 days   Consultants:  Physical therapy: Skilled nursing facility.  Speech therapy: Dysphagia 1 (Puree);Nectar-thick liquid  Liquid Administration via: Cup;Straw Medication Administration: Crushed with puree Supervision: Staff to assist with self feeding;Full supervision/cueing for compensatory strategies Compensations: Slow rate;Small sips/bites Postural Changes: Seated upright at 90 degrees   Procedures:    Antibiotics:  HPI/Subjective: Combative, noncompliant, refusing medications, not eating--per nursing. Patient can offer no history. RN reports Zyprexa was somewhat effective earlier. RN reports vital signs this  afternoon showed mild hypothermia and possible hypoxia although it is difficult to get a waveform given the patient's noncompliance and combativeness. Would not comply with EEG or MRI  Objective: Filed Vitals:   11/28/15 0018 11/28/15 0646 11/28/15 1510 11/28/15 1542  BP: 140/75 139/53 136/33   Pulse: 67 64 94   Temp: 97.4 F (36.3 C) 97.4 F (36.3 C) 94.1 F (34.5 C)  TempSrc: Axillary Axillary Rectal   Resp: 18 18 18    Weight:      SpO2: 92% 100%  90%    Intake/Output Summary (Last 24 hours) at 11/28/15 1547 Last data filed at 11/27/15 2233  Gross per 24 hour  Intake  187.5 ml  Output      0 ml  Net  187.5 ml     Filed Weights   11/25/15 0444 11/26/15 0628  Weight: 49.1 kg (108 lb 3.9 oz) 49.6 kg (109 lb 5.6 oz)    Exam:    General: Agitated, appears to be responding to internal stimuli, exam is difficult and limited Eyes:  noncompliant with exam eyes appear grossly unremarkable ENT: grossly normal hearing, lips  Cardiovascular: RRR, no m/r/g. No LE edema. DP pulses 2+ bilaterally Respiratory: CTA bilaterally, Respiratory effort appears normal but again exam is limited. Abdomenn: soft, ntnd Skin: no rash or induration noted Musculoskeletal: grossly normal tone BUE/BLE; moves all extremities spontaneously Psychiatric: odd affect, confused, belligerent, speech inappropriate Neurologic: grossly non-focal but very limited.  New data reviewed:  Basic metabolic panel unremarkable   CXR NAD  Scheduled Meds: . aspirin EC  81 mg Oral Daily  . cholecalciferol  1,000 Units Oral Daily  . enoxaparin (LOVENOX) injection  30 mg Subcutaneous Q24H  . feeding supplement (ENSURE ENLIVE)  237 mL Oral Q24H  . feeding supplement (PRO-STAT SUGAR FREE 64)  30 mL Oral TID  . fluconazole  100 mg Oral Daily  . folic acid  1 mg Oral BID  . hydroxychloroquine  200 mg Oral BID  . LORazepam  1 mg Intramuscular QHS  . [START ON 11/29/2015] methotrexate  15 mg Oral Weekly  . metoprolol  succinate  50 mg Oral Daily  . multivitamin  5 mL Oral Daily  . OLANZapine  2.5 mg Intramuscular BID  . senna-docusate  1 tablet Oral BID  . simvastatin  40 mg Oral Daily   Continuous Infusions: . dextrose 5 % with kcl 50 mL/hr at 11/27/15 1848    Active Problems:   Essential hypertension   CORONARY ATHEROSCLEROSIS, ARTERY BYPASS GRAFT   Rheumatoid arthritis (HCC)   Pressure ulcer   Malnutrition of moderate degree   Acute encephalopathy   Chronic diastolic congestive heart failure (HCC)   Dehydration with hypernatremia   Hypernatremia   Other affective psychosis   Time spent 35 minutes

## 2015-11-28 NOTE — Progress Notes (Signed)
Warm blankets applied. Vwilliams,rn.

## 2015-11-28 NOTE — Progress Notes (Signed)
Called and spoke to the nurse regarding EEG order; she advised patient is too agitated and restless for EEG. She will also note the patient's account.

## 2015-11-28 NOTE — Consult Note (Signed)
[facility] Consult follow-up  Reason for Consult:  Confused and combative with staff and AMS Referring Physician:  Dr. Algis Liming Patient Identification: [name] MRN:  [id number] Principal Diagnosis: <principal problem not specified> Diagnosis:   Patient Active Problem List   Diagnosis Date Noted  . Other affective psychosis [F39]   . Hypernatremia [E87.0] 11/24/2015  . Dehydration with hypernatremia [E87.0] 11/23/2015  . Pressure ulcer [L89.90] 11/14/2015  . Malnutrition of moderate degree [E44.0] 11/14/2015  . Acute encephalopathy [G93.40] 11/14/2015  . Pancytopenia ([place]) [P91.505] 11/14/2015  . Transaminitis [R74.0] 11/14/2015  . Chronic diastolic congestive heart failure ([place]) [I50.32] 11/14/2015  . Chronic cutaneous venous stasis ulcer ([place]) [I83.009] 11/14/2015  . Rheumatoid arthritis ([place]) [M06.9] 11/13/2015  . Hereditary and idiopathic peripheral neuropathy [G60.9] 05/17/2015  . History of aortic aneurysm [Z86.79]   . CORONARY ATHEROSCLEROSIS, ARTERY BYPASS GRAFT [I25.810] 08/23/2008  . Essential hypertension [I10] 05/11/2008  . FIBROCYSTIC BREAST DISEASE, HX OF [Z87.898] 05/21/2007    Total Time spent with patient: 30 minutes  Subjective:   Colleen KREISCHER is a 82 y.o. female patient admitted with AMS.  HPI:  [name] is a 82 y.o. female, recent resident of SNF admitted to [facility] for increased symptoms of worsening confusion, emotion instability and combativeness with staff. patient has been frustrated, upset because no one believe her. She is poor historian and has no family at bed side. She has been suffering with loss of singificant cognitions. She does not know why she was in hospital and her current medical or emotional problems. She knows her name, age and being hospitalized. She complaints about not able to eat, drink and sleep better. She denied suicide or homicide ideation, intention or plans. Will ask unit social service to contact SNF and  family contact regarding her baseline functioning. She has a walker in her room and has multiple laceration on her legs and hands.   Medical history: She was recent hospitalization 11/13/15-11/19/15 with diagnosis of acute encephalopathy, sepsis secondary to UTI, was discharged to SNF, returns to the [facility] ED on 11/23/15 with complaints of worsening confusion/combativeness and decreased oral intake. She has PMH of ASCVD, CABG, AAA repair, rheumatoid arthritis, chronic low back pain, chronic diastolic CHF, chronic lower extremity venous stasis ulcers, HTN. Patient is confused and not a reliable historian. As per ED staff, patient was spitting at them. As per report, she has been refusing to take her medications, increasingly combative towards staff which has limited her care at the SNF. SNF thereby sent her to the hospital for IV therapy. In the ED, lab work shows sodium 155, potassium 3.3, chloride 99, bicarbonate 38, BUN 47, creatinine 1, WBC 16.3, chest x-ray without acute abnormality and urine microscopy not indicative of UTI. Hospitalist admission was requested.  Interval history: Patient seen for psychiatric consultation follow-up today. Case discussed with staff RN who reported that patient has been irritable and agitated and not able to cooperate for MRI of brain. She was given Zyprexa 58m IM which helped her to calm down and sleep. Patient has new onset of confusion, irritability, behavioral problems like splitting on staff, paranoid psychosis and altered mental status. Patient reportedly not able to eat, drink or sleep.  Patient denies active suicidal/homicidal ideation. Will continue her current medication Zyprexa 2.5 mg IM twice daily as she is able to tolerate with out EPS and currently resting without distress. Continue Lorazepam to 1 mg at bedtime for better control of insomnia and agitation.  Past  Psychiatric History: None reported  Risk to Self: Is patient at risk for suicide?:  No Risk to Others:   Prior Inpatient Therapy:   Prior Outpatient Therapy:    Past Medical History:  Past Medical History  Diagnosis Date  . DEPRESSION 05/21/2007  . FIBROCYSTIC BREAST DISEASE, HX OF 05/21/2007  . HYPERLIPIDEMIA-MIXED 11/26/2008    takes Simvastatin daily  . Irritable bowel syndrome 05/21/2007  . KNEE PAIN 09/16/2007  . LEG EDEMA, BILATERAL 05/21/2009  . OSTEOPENIA 05/21/2007  . URTICARIA, CHRONIC 05/21/2007  . History of aortic aneurysm     resection and grafting of a 5.2-cm ascending aortic arch aneurysm August 2009  . Dyslipidemia   . Vitamin D deficiency   . Urticaria   . Neuropathy (HCC)     left radial  neuropathy with wrist drop  . Diverticulosis   . Irritable bowel syndrome   . GERD (gastroesophageal reflux disease)   . 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 Vaginal yeast infection     recently told to use monistat, hasn't started as of 01/26/2012  . HYPERTENSION 05/11/2008    takes Metoprolol and HCTZ daily  . Asthma     hx of;as a child  . Seasonal allergies     takes Hydroxyzine prn and Zyrtec 2 times a week  . Peripheral edema     takes Furosemide prn  . Pneumonia     history of;in the 90's  . Weakness     hands and feet  . Sciatica   . OSTEOARTHRITIS 01/20/2008    takes Methotrexate weekly  . DJD (degenerative joint disease)     history of   . Rheumatoid arthritis(714.0)   . Chronic back pain     takes Norco prn  . Bruises easily     takes Plaquenil daily  . CORONARY ATHEROSCLEROSIS, ARTERY BYPASS GRAFT 08/23/2008  . CAD (coronary artery disease)     s/p CABG -  x 1 with LIMA to LAD August 2009 /   . Urinary incontinence   . Bowel incontinence   . History of colon polyps   . Urinary frequency   . Urinary urgency   . Nocturia   . Anemia     takes folic acid bid  . Urinary incontinence     wears depends  . Hereditary and idiopathic peripheral neuropathy 05/17/2015    Past Surgical History  Procedure Laterality Date  .  Appendectomy    . Cesarean section  1968    x 1  . Hernia repair    . Coronary artery bypass graft  August 2009    CABG x 1 with LIMA to LAD /  . Ascending aortic aneurysm repair  August 2009     resection and grafting of a 5.2-cm ascending aortic arch aneurysm  . Joint replacement      2009 & 2010- both knees  . Back surgery      2011- cerv. fusion   . Breast surgery      biopsy x2- R breast  . Tonsillectomy      as a child  . Cardiac catheterization  2009  . Colonoscopy    . Lumbar laminectomy/decompression microdiscectomy Bilateral 07/11/2013    Procedure: LUMBAR LAMINECTOMY/DECOMPRESSION MICRODISCECTOMY LUMBAR FIVE SACRAL ONE;  Surgeon: Elaina Hoops, MD;  Location: [facility];  Service: Neurosurgery;  Laterality: Bilateral;   Family History:  Family History  Problem Relation Age of Onset  . Heart disease    .  Coronary artery disease    . Anesthesia problems Neg Hx   . Hypotension Neg Hx   . Malignant hyperthermia Neg Hx   . Pseudochol deficiency Neg Hx   . Heart disease Mother   . Stroke Father   . Cancer Maternal Grandmother   . Stroke Paternal Grandmother    Family Psychiatric  History: unknown Social History:  History  Alcohol Use No     History  Drug Use No    Social History   Social History  . Marital Status: Widowed    Spouse Name: N/A  . Number of Children: 2  . Years of Education: N/A   Occupational History  . retired    Social History Main Topics  . Smoking status: Never Smoker   . Smokeless tobacco: Never Used  . Alcohol Use: No  . Drug Use: No  . Sexual Activity: No   Other Topics Concern  . None   Social History Narrative   Patient is right handed.   Patient drinks a little caffeine daily.   Additional Social History:                          Allergies:   Allergies  Allergen Reactions  . Amoxicillin     REACTION: unspecified  . Cyclobenzaprine Hcl     REACTION: rash  . Mupirocin     Unknown reaction  .  Penicillins Hives  . Tetanus Toxoid Hives    Labs:  Results for orders placed or performed during the hospital encounter of 11/23/15 (from the past 48 hour(s))  Blood gas, venous     Status: Abnormal   Collection Time: 11/27/15 11:30 AM  Result Value Ref Range   O2 Content 2.0 L/min   Delivery systems NASAL CANNULA    pH, Ven 7.343 (H) 7.250 - 7.300   pCO2, Ven 47.5 45.0 - 50.0 mmHg   pO2, Ven 36.7 30.0 - 45.0 mmHg   Bicarbonate 25.1 (H) 20.0 - 24.0 mEq/L   TCO2 23.3 0 - 100 mmol/L   Acid-base deficit 0.4 0.0 - 2.0 mmol/L   O2 Saturation 56.0 %   Patient temperature 98.6    Collection site VEIN    Drawn by DRAWN BY RN    Sample type VEIN   Basic metabolic panel     Status: None   Collection Time: 11/28/15  7:40 AM  Result Value Ref Range   Sodium 140 135 - 145 mmol/L   Potassium 5.0 3.5 - 5.1 mmol/L   Chloride 102 101 - 111 mmol/L   CO2 31 22 - 32 mmol/L   Glucose, Bld 99 65 - 99 mg/dL   BUN 9 6 - 20 mg/dL   Creatinine, Ser 0.51 0.44 - 1.00 mg/dL   Calcium 9.5 8.9 - 10.3 mg/dL   GFR calc non Af Amer >60 >60 mL/min   GFR calc Af Amer >60 >60 mL/min    Comment: (NOTE) The eGFR has been calculated using the CKD EPI equation. This calculation has not been validated in all clinical situations. eGFR's persistently <60 mL/min signify possible Chronic Kidney Disease.    Anion gap 7 5 - 15    Current Facility-Administered Medications  Medication Dose Route Frequency Provider Last Rate Last Dose  . acetaminophen (TYLENOL) tablet 650 mg  650 mg Oral Q6H PRN Modena Jansky, MD       Or  . acetaminophen (TYLENOL) suppository 650 mg  650 mg Rectal Q6H  PRN Modena Jansky, MD      . albuterol (PROVENTIL) (2.5 MG/3ML) 0.083% nebulizer solution 2.5 mg  2.5 mg Nebulization Q2H PRN Modena Jansky, MD      . aspirin EC tablet 81 mg  81 mg Oral Daily Modena Jansky, MD   81 mg at 11/23/15 1619  . cholecalciferol (VITAMIN D) tablet 1,000 Units  1,000 Units Oral Daily Modena Jansky, MD   1,000 Units at 11/23/15 1619  . dextrose 5 % 1,000 mL with potassium chloride 40 mEq infusion   Intravenous Continuous Modena Jansky, MD 50 mL/hr at 11/27/15 1848    . enoxaparin (LOVENOX) injection 30 mg  30 mg Subcutaneous Q24H Modena Jansky, MD   30 mg at 11/24/15 1658  . feeding supplement (ENSURE ENLIVE) (ENSURE ENLIVE) liquid 237 mL  237 mL Oral Q24H Clayton Bibles, RD   237 mL at 11/24/15 1508  . feeding supplement (PRO-STAT SUGAR FREE 64) liquid 30 mL  30 mL Oral TID Clayton Bibles, RD   30 mL at 11/24/15 1655  . fluconazole (DIFLUCAN) tablet 100 mg  100 mg Oral Daily Modena Jansky, MD   100 mg at 11/23/15 1619  . folic acid (FOLVITE) tablet 1 mg  1 mg Oral BID Modena Jansky, MD   1 mg at 11/27/15 2234  . hydroxychloroquine (PLAQUENIL) tablet 200 mg  200 mg Oral BID Modena Jansky, MD   200 mg at 11/27/15 2234  . LORazepam (ATIVAN) injection 1 mg  1 mg Intramuscular QHS Ambrose Finland, MD   1 mg at 11/27/15 2235  . [START ON 11/29/2015] methotrexate (RHEUMATREX) tablet 15 mg  15 mg Oral Weekly Modena Jansky, MD      . metoprolol succinate (TOPROL-XL) 24 hr tablet 50 mg  50 mg Oral Daily Modena Jansky, MD   50 mg at 11/23/15 1619  . multivitamin liquid 5 mL  5 mL Oral Daily Clayton Bibles, RD   5 mL at 11/24/15 1509  . OLANZapine (ZYPREXA) injection 2.5 mg  2.5 mg Intramuscular BID Ambrose Finland, MD   2.5 mg at 11/28/15 0814  . senna-docusate (Senokot-S) tablet 1 tablet  1 tablet Oral BID Modena Jansky, MD   1 tablet at 11/23/15 1626  . simvastatin (ZOCOR) tablet 40 mg  40 mg Oral Daily Modena Jansky, MD   40 mg at 11/23/15 1619    Musculoskeletal: Strength & Muscle Tone: decreased Gait & Station: unable to stand Patient leans: N/A  Psychiatric Specialty Exam: ROS poor sleep and not able to eat and drink and irritability  Blood pressure 139/53, pulse 64, temperature 97.4 F (36.3 C), temperature source Axillary, resp. rate 18,  weight 49.6 kg (109 lb 5.6 oz), SpO2 100 %.Body mass index is 19.99 kg/(m^2).  General Appearance: Bizarre and Guarded  Eye Contact::  Poor  Speech:  Clear and Coherent and Slow  Volume:  Decreased  Mood:  Anxious, Depressed and Irritable  Affect:  Depressed and Tearful  Thought Process:  Disorganized and Irrelevant  Orientation:  Other:  to her name, friend name, age and being hospitalized only.   Thought Content:  Rumination and agitation  Suicidal Thoughts:  No  Homicidal Thoughts:  No  Memory:  Immediate;   Poor Recent;   Poor  Judgement:  Impaired  Insight:  Shallow  Psychomotor Activity:  Decreased  Concentration:  Fair  Recall:  [place]: Fair  Akathisia:  Negative  Handed:  Right  AIMS (if indicated):     Assets:  Communication Skills Desire for Improvement Financial Resources/Insurance Leisure Time Resilience Social Support  ADL's:  Impaired  Cognition: Impaired,  Mild  Sleep:      Treatment Plan Summary: Daily contact with patient to assess and evaluate symptoms and progress in treatment and Medication management  Discontinue Haldol which is not helping   Risperidone M- tab 0.5 mg and remeron sol tab as patient refusing oral medications Continue Zyprexa 2.5 mg IM twice daily for paranoid psychosis Continue Lorazepam 1 mg PO Qhs for insomnia, agitation and restlessness Appreciate psychiatric consultation and follow up as clinically required Please contact [phone] or [phone] if needs further assistance    Disposition: Patient does not meet criteria for psychiatric inpatient admission. Supportive therapy provided about ongoing stressors.  Patient will be referred to SNF when medically stable  Conway Fedora,JANARDHAHA R. 11/28/2015 2:36 PM

## 2015-11-28 NOTE — Clinical Social Work Note (Signed)
Clinical Social Work Assessment  Patient Details  Name: [name] MRN: [id number] Date of Birth: 1932/08/19  Date of referral:  11/28/15               Reason for consult:                   Permission sought to share information with:  Family Supports, Oceanographer granted to share information::     Name::        Agency::     Relationship::     Contact Information:     Housing/Transportation Living arrangements for the past 2 months:  Skilled Nursing Facility (Pt was placed at [facility] prior to this hospitalizaiton; prior to that she was living at home) Source of Information:  Adult Children Patient Interpreter Needed:  None Criminal Activity/Legal Involvement Pertinent to Current Situation/Hospitalization:  No - Comment as needed Significant Relationships:  Adult Children Lives with:  Facility Resident Do you feel safe going back to the place where you live?  Yes (At prev SNF) Need for family participation in patient care:  Yes (Comment)  Care giving concerns:  CSW spoke with pt's emergency contact, her son [name] [phone] as pt has been agitated, combative, and disorientated X 3 during this hospitalization. Pt's son is very concerned; he states this is not pt's baseline. Per [name], "Normally she's a nice, laid back person." [name] reports pt became slightly confused prior to her previous hospitalization on 11/13/15 [for UTI, per his report]. [name] states his mother's behavior continued to worsen after she was placed at [place], [place].   Social Worker assessment / plan:  CSW explained that pt is being followed by psych and needed additional information to determine if this was baseline behavior for the pt. As stated above, pt's current behavior is abnormal and family is highly concerned. CSW will continue to follow pt for placement needs and give report to the psychiatrist.   Employment status:  Retired Database administrator PT  Recommendations:  Skilled Nursing Facility Information / Referral to community resources:  Skilled Nursing Facility  Patient/Family's Response to care:  Pt's son is very concerned; he would like to see his mother return to baseline prior to returning to SNF.   Patient/Family's Understanding of and Emotional Response to Diagnosis, Current Treatment, and Prognosis:  Pt's son is realistic regarding the level of care the pt currently needs. Once she's stable, he'd like her to return to [facility], SNF for continued rehab prior to returning home. Pt's son expressed concern regarding his mother's current behaviors as this isn't her baseline.   Emotional Assessment Appearance:  Appears stated age Attitude/Demeanor/Rapport:  Unable to Assess Affect (typically observed):  Unable to Assess Orientation:  Oriented to Place, Oriented to  Time, Oriented to Situation Alcohol / Substance use:  Not Applicable Psych involvement (Current and /or in the community):  Yes (Comment)  Discharge Needs  Concerns to be addressed:  Mental Health Concerns, Discharge Planning Concerns Readmission within the last 30 days:  Yes Current discharge risk:  Other (Pt is aggitated and combative ) Barriers to Discharge:  Continued Medical Work up   [place] Blackwater, LCSW 11/28/2015, 2:53 PM

## 2015-11-28 NOTE — Clinical Social Work Note (Signed)
CSW spoke to [place] at [place], [place] [phone] where pt was previously living prior to recent hospitalization. Per Tresa Endo, pt's current behaviors aren't her baseline [pt's son also confirms this, see CSW assessment 11/27/14]. [facility] will not hold a bed for the pt but is willing to review a new referral once pt is medically stable. CSW will continue to follow for placement needs.    Etta Quill, LCSW [phone] Hospital psychiatric & 5E, 5W 31-35 Licensed Clinical Social Worker

## 2015-11-28 NOTE — Progress Notes (Signed)
SLP Cancellation Note  Patient Details Name: [name] MRN: [id number] DOB: Dec 29, 1931   Cancelled treatment:       Reason Eval/Treat Not Completed: Other (comment) (pt at Xray for STAT CT per review of chart, has been having ongoing agitation and requiring medication to rest per chart review, will follow up)   Donavan Burnet, MS [facility] SLP [phone]

## 2015-11-28 NOTE — Progress Notes (Signed)
OT Cancellation Note  Patient Details Name: ALLIA WILTSEY MRN: [id number] DOB: Oct 03, 1932   Cancelled Treatment:    Reason Eval/Treat Not Completed: Patient's level of consciousness . Pt now sleeping after meds for agitation. Per CNA- pt now sleeping and was screaming.  Will check back on pt next day Lise Auer, [place] [phone]  Einar Crow D 11/28/2015, 11:26 AM

## 2015-11-28 NOTE — NC FL2 (Signed)
[place] MEDICAID FL2 LEVEL OF CARE SCREENING TOOL     IDENTIFICATION  Patient Name: [name] Birthdate: 05/25/1932 Sex: female Admission Date (Current Location): 11/23/2015  [facility] and [place] Number:  Producer, television/film/video and Address:  [facility],  [address], [place] [address]      Provider Number: [id number]  Attending Physician Name and Address:  Standley Brooking, MD  Relative Name and Phone Number:       Current Level of Care: Hospital Recommended Level of Care: Skilled Nursing Facility Prior Approval Number:    Date Approved/Denied:   PASRR Number: [id number]  Discharge Plan: SNF    Current Diagnoses: Patient Active Problem List   Diagnosis Date Noted  . Other affective psychosis   . Hypernatremia 11/24/2015  . Dehydration with hypernatremia 11/23/2015  . Pressure ulcer 11/14/2015  . Malnutrition of moderate degree 11/14/2015  . Acute encephalopathy 11/14/2015  . Pancytopenia (HCC) 11/14/2015  . Transaminitis 11/14/2015  . Chronic diastolic congestive heart failure (HCC) 11/14/2015  . Chronic cutaneous venous stasis ulcer (HCC) 11/14/2015  . Rheumatoid arthritis (HCC) 11/13/2015  . Hereditary and idiopathic peripheral neuropathy 05/17/2015  . History of aortic aneurysm   . CORONARY ATHEROSCLEROSIS, ARTERY BYPASS GRAFT 08/23/2008  . Essential hypertension 05/11/2008  . FIBROCYSTIC BREAST DISEASE, HX OF 05/21/2007    Orientation RESPIRATION BLADDER Height & Weight    Place, Self (disorientated to situation and time)  Normal (Room air) Incontinent 5\' 2"  (157.5 cm) 109 lbs.  BEHAVIORAL SYMPTOMS/MOOD NEUROLOGICAL BOWEL NUTRITION STATUS  Other (Comment) (Per chart review, worsening confusion, emotion instability and combativeness with staff.)   Incontinent Diet (Regular)  AMBULATORY STATUS COMMUNICATION OF NEEDS Skin   Extensive Assist Verbally Other (Comment) (bilateral LE wounds)                       Personal Care  Assistance Level of Assistance  Bathing, Feeding, Dressing Bathing Assistance: Maximum assistance Feeding assistance: Independent Dressing Assistance: Maximum assistance     Functional Limitations Info             SPECIAL CARE FACTORS FREQUENCY  OT (By licensed OT), PT (By licensed PT)     PT Frequency: 5 X weekly OT Frequency: 5 X weekly            Contractures Contractures Info: Not present    Additional Factors Info  Code Status, Allergies Code Status Info: DNR Allergies Info: Amoxicillin, Cyclobenzaprine Hcl, Mupirocin, Penicillins, Tetanus Toxoid            Current Medications (11/28/2015):  This is the current hospital active medication list Current Facility-Administered Medications  Medication Dose Route Frequency Provider Last Rate Last Dose  . acetaminophen (TYLENOL) tablet 650 mg  650 mg Oral Q6H PRN 01/26/2016, MD       Or  . acetaminophen (TYLENOL) suppository 650 mg  650 mg Rectal Q6H PRN Elease Etienne, MD      . albuterol (PROVENTIL) (2.5 MG/3ML) 0.083% nebulizer solution 2.5 mg  2.5 mg Nebulization Q2H PRN Elease Etienne, MD      . aspirin EC tablet 81 mg  81 mg Oral Daily Elease Etienne, MD   81 mg at 11/23/15 1619  . cholecalciferol (VITAMIN D) tablet 1,000 Units  1,000 Units Oral Daily 11/25/15, MD   1,000 Units at 11/23/15 1619  . dextrose 5 % 1,000 mL with potassium chloride 40 mEq infusion   Intravenous Continuous  Elease Etienne, MD 50 mL/hr at 11/27/15 1848    . enoxaparin (LOVENOX) injection 30 mg  30 mg Subcutaneous Q24H Elease Etienne, MD   30 mg at 11/24/15 1658  . feeding supplement (ENSURE ENLIVE) (ENSURE ENLIVE) liquid 237 mL  237 mL Oral Q24H Tilda Franco, RD   237 mL at 11/24/15 1508  . feeding supplement (PRO-STAT SUGAR FREE 64) liquid 30 mL  30 mL Oral TID Tilda Franco, RD   30 mL at 11/24/15 1655  . fluconazole (DIFLUCAN) tablet 100 mg  100 mg Oral Daily Elease Etienne, MD   100 mg at 11/23/15 1619  . folic  acid (FOLVITE) tablet 1 mg  1 mg Oral BID Elease Etienne, MD   1 mg at 11/27/15 2234  . hydroxychloroquine (PLAQUENIL) tablet 200 mg  200 mg Oral BID Elease Etienne, MD   200 mg at 11/27/15 2234  . LORazepam (ATIVAN) injection 1 mg  1 mg Intramuscular QHS Leata Mouse, MD   1 mg at 11/27/15 2235  . [START ON 11/29/2015] methotrexate (RHEUMATREX) tablet 15 mg  15 mg Oral Weekly Elease Etienne, MD      . metoprolol succinate (TOPROL-XL) 24 hr tablet 50 mg  50 mg Oral Daily Elease Etienne, MD   50 mg at 11/23/15 1619  . multivitamin liquid 5 mL  5 mL Oral Daily Tilda Franco, RD   5 mL at 11/24/15 1509  . OLANZapine (ZYPREXA) injection 2.5 mg  2.5 mg Intramuscular BID Leata Mouse, MD   2.5 mg at 11/28/15 0814  . senna-docusate (Senokot-S) tablet 1 tablet  1 tablet Oral BID Elease Etienne, MD   1 tablet at 11/23/15 1626  . simvastatin (ZOCOR) tablet 40 mg  40 mg Oral Daily Elease Etienne, MD   40 mg at 11/23/15 1619     Discharge Medications: Please see discharge summary for a list of discharge medications.  Relevant Imaging Results:  Relevant Lab Results:   Additional Information SSN: [id number]  [place] Blackwater, LCSW

## 2015-11-28 NOTE — Progress Notes (Signed)
Pt 02 sat 90% on 6l [place] temp 94.1 F rectally. MD notified. Warm blankets applied to pt per Irene Limbo MD requests and states he will be up to see her shortly.

## 2015-11-29 DIAGNOSIS — R000 Tachycardia, unspecified: Secondary | ICD-10-CM

## 2015-11-29 DIAGNOSIS — R1314 Dysphagia, pharyngoesophageal phase: Secondary | ICD-10-CM

## 2015-11-29 MED ORDER — METOPROLOL TARTRATE 1 MG/ML IV SOLN
5.0000 mg | Freq: Four times a day (QID) | INTRAVENOUS | Status: DC
  Administered 2015-11-29 – 2015-12-08 (×35): 5 mg via INTRAVENOUS
  Filled 2015-11-29 (×43): qty 5

## 2015-11-29 MED ORDER — LORAZEPAM 2 MG/ML IJ SOLN
1.0000 mg | Freq: Four times a day (QID) | INTRAMUSCULAR | Status: DC | PRN
  Administered 2015-12-01 – 2015-12-08 (×8): 1 mg via INTRAVENOUS
  Filled 2015-11-29 (×8): qty 1

## 2015-11-29 MED ORDER — PRO-STAT SUGAR FREE PO LIQD
30.0000 mL | Freq: Every day | ORAL | Status: DC
  Filled 2015-11-29 (×5): qty 30

## 2015-11-29 MED ORDER — LORAZEPAM 2 MG/ML IJ SOLN
1.0000 mg | Freq: Every day | INTRAMUSCULAR | Status: DC
  Administered 2015-11-29 – 2015-12-09 (×9): 1 mg via INTRAVENOUS
  Filled 2015-11-29 (×9): qty 1

## 2015-11-29 NOTE — Evaluation (Signed)
Occupational Therapy Evaluation Patient Details Name: [name] MRN: [id number] DOB: 1932/10/10 Today's Date: 11/29/2015    History of Present Illness 82 yo female admitted with dehydration, hypernatremia, worsening confusion and poor intake.  She was at [facility] in Dec '16 for encephalopathy and sepsis due to UTI.  She had been at [facility] until this admission.  Pt also being tx'd for acute psychosis during this admission.   Hx of RA, CABG, chronic low back pain.   Clinical Impression   Pt was admitted for the above.  This OT worked with her at last admission in December; she is significantly weaker.  Pt did participate during this evaluation, but she is very weak and deconditioned and needs total A for adls. She will benefit from skilled OT to increase participation with adls and generalized strengthening.  Most goals are precursors for adls, however, grooming and feeding are being addressed in this setting.    Follow Up Recommendations  SNF    Equipment Recommendations   (to be further assessed)    Recommendations for Other Services       Precautions / Restrictions Precautions Precautions: Fall Precaution Comments: can be combative per chart notes; calm at time of eval Restrictions Weight Bearing Restrictions: No      Mobility Bed Mobility Overal bed mobility: Needs Assistance Bed Mobility: Supine to Sit;Sit to Supine Rolling: Total assist   Supine to sit: Total assist;+2 for physical assistance;+2 for safety/equipment;HOB elevated Sit to supine: Total assist;+2 for physical assistance;+2 for safety/equipment;HOB elevated   General bed mobility comments: Assist for trunk and bil Les. Utilized bed pad. Increased time.   Transfers                 General transfer comment: unable    Balance   Sitting-balance support: Feet supported Sitting balance-Leahy Scale: Zero Sitting balance - Comments: total support from therapist; flexed posture; unable to hold head up                                     ADL   Eating/Feeding: Total assistance (supported at eob with total A; 5% of task)   Grooming: Wash/dry face;Total assistance                                 General ADL Comments: Attempted feeding (1 spoon of ice tea and one spoon of jello.  Pt spit both out. She did cough on cue when asked 3xs.  Assisted her with holding washcloth in LUE.  She initiated one swipe of mouth      Vision Additional Comments: initially pt with strong visual gaze to L; when in bed able to turn eyes to R and locate daughter who had come in   Perception     Praxis      Pertinent Vitals/Pain Pain Assessment: Faces Faces Pain Scale: Hurts little more (when assisted back to bed but denied pain when asked) Pain Descriptors / Indicators: Grimacing     Hand Dominance  (L stronger than R)   Extremity/Trunk Assessment Upper Extremity Assessment Upper Extremity Assessment: Generalized weakness           Communication Communication Communication: Expressive difficulties (mumbling:  difficult to understand)   Cognition Arousal/Alertness: Awake/alert Behavior During Therapy: Flat affect Overall Cognitive Status: Difficult to assess Area of Impairment: Orientation;Attention;Memory;Following commands;Safety/judgement;Problem solving  Current Attention Level: Focused           General Comments: pt nodded when I told her she was at [facility].  She did follow one step commands with extra time.   General Comments       Exercises Exercises: Other exercises Other Exercises Other Exercises: performed 5 reps, 2 sets of FF, AAROM; pt iniitiated movement with cues. (strength 2-/5).  Also initiated grip with R hand only; strength 3+.  Strength greater from digits 4 and 5   Shoulder Instructions      Home Living Family/patient expects to be discharged to:: Skilled nursing facility                                        Prior  Functioning/Environment Level of Independence: Needs assistance        Comments: recently discharged to SNF    OT Diagnosis: Generalized weakness;Altered mental status   OT Problem List: Decreased strength;Decreased activity tolerance;Impaired balance (sitting and/or standing);Decreased cognition;Decreased coordination;Pain;Impaired UE functional use   OT Treatment/Interventions: Self-care/ADL training;Therapeutic exercise;DME and/or AE instruction;Therapeutic activities;Cognitive remediation/compensation;Visual/perceptual remediation/compensation;Patient/family education;Balance training    OT Goals(Current goals can be found in the care plan section) Acute Rehab OT Goals Patient Stated Goal: none stated OT Goal Formulation: Patient unable to participate in goal setting Time For Goal Achievement: 12/13/15 Potential to Achieve Goals: Fair ADL Goals Pt Will Perform Grooming: with min assist;sitting Additional ADL Goal #1: pt will perform 10 reps of 3 UE exercises, AAROM with min cues Additional ADL Goal #2: Pt will sit EOB with mod A in preparation for transfers/seated ADLs Additional ADL Goal #3: Pt will self-feed 5 bites with max A and swallow with min cues Additional ADL Goal #4: pt will perform bed mobility with max A in preparation for adls  OT Frequency: Min 2X/week   Barriers to D/C:            Co-evaluation PT/OT/SLP Co-Evaluation/Treatment: Yes Reason for Co-Treatment: Complexity of the patient's impairments (multi-system involvement);For patient/therapist safety PT goals addressed during session: Mobility/safety with mobility;Balance OT goals addressed during session: ADL's and self-care;Strengthening/ROM      End of Session Nurse Communication:  (spitting out food/beverage)  Activity Tolerance: Patient limited by fatigue Patient left: in bed;with bed alarm set;with family/visitor present;with restraints reapplied   Time: 1134-1158 OT Time Calculation (min): 24  min Charges:  OT General Charges $OT Visit: 1 Procedure OT Evaluation $OT Eval Moderate Complexity: 1 Procedure G-Codes:    Naelle Diegel 06-Dec-2015, 12:32 PM Marica Otter, OTR/L [phone] 12-06-15

## 2015-11-29 NOTE — Progress Notes (Signed)
Nutrition Follow-up  DOCUMENTATION CODES:   Not applicable  INTERVENTION:  - Recommend trial appetite stimulant - Continue Ensure Enlive once/day and will decrease Prostat to once/day also until pt begins accepting POs - RD will continue to monitor for needs  NUTRITION DIAGNOSIS:   Increased nutrient needs related to wound healing as evidenced by estimated needs. -ongoing  GOAL:   Patient will meet greater than or equal to 90% of their needs -unmet  MONITOR:   PO intake, Supplement acceptance, Labs, Weight trends, Skin, I & O's  ASSESSMENT:   82 y.o. female , recent hospitalization 11/13/15-11/19/15 with diagnosis of acute encephalopathy, sepsis secondary to UTI, was discharged to SNF, returns to the [facility] ED on 11/23/15 with complaints of worsening confusion/combativeness and decreased oral intake. She has PMH of ASCVD, CABG, AAA repair, rheumatoid arthritis, chronic low back pain, chronic diastolic CHF, chronic lower extremity venous stasis ulcers, HTN. Patient is confused and not a reliable historian. As per ED staff, patient was spitting at them. As per report, she has been refusing to take her medications, increasingly combative towards staff which has limited her care at the SNF.   1/5 Pt continues to be agitated and refusing meals since last assessment. Continue to be unable to complete physical assessment. Lunch tray delivered at time of RD visit but pt uninterested. Pt may be appropriate for trial of appetite stimulant; will monitor for further decisions concerning nutrition/route of nutrition.  Not meeting needs. Medications reviewed; continued multivitamin. Labs reviewed.   12/31 - Pt assessed during previous admission (12/26).  - Pt was diagnosed with moderate malnutrition. - Pt currently confused/agitated/combative.  - Per H&P, not a reliable historian and refusing medications.  - Pt with multiple wounds.  - RD to order supplements to provide extra  protein.  - Pt has lost 19 lb since 12/05 (14% weight loss x 1 month, significant for time frame). - Unable to perform nutrition focused physical exam.  - Suspect malnutrition present given continued weight loss and mental status.   Diet Order:  Diet full liquid Room service appropriate?: Yes; Fluid consistency:: Thin  Skin:  Wound (see comment) (multiple leg wounds, stage II sacral and coccyx ulcers)  Last BM:  1/3  Height:   Ht Readings from Last 1 Encounters:  11/13/15 5\' 2"  (1.575 m)    Weight:   Wt Readings from Last 1 Encounters:  11/26/15 109 lb 5.6 oz (49.6 kg)    Ideal Body Weight:  50 kg  BMI:  Body mass index is 19.99 kg/(m^2).  Estimated Nutritional Needs:   Kcal:  1700-1900  Protein:  80-90g  Fluid:  1.9L/day  EDUCATION NEEDS:   No education needs identified at this time     01/24/16, RD, LDN Inpatient Clinical Dietitian Pager # [phone] After hours/weekend pager # [phone]

## 2015-11-29 NOTE — Progress Notes (Signed)
SLP Cancellation Note  Patient Details Name: [name] MRN: [id number] DOB: Jun 13, 1932   Cancelled treatment:       Reason Eval/Treat Not Completed: Other (comment) Pt not arousable.  OT reports willingness to consume a few boluses with her, but pt promptly spit them out. Will follow.   Blenda Mounts Laurice 11/29/2015, 3:18 PM

## 2015-11-29 NOTE — Progress Notes (Signed)
PROGRESS NOTE  [name] VZD:[id number] DOB: 07-16-1932 DOA: 11/23/2015 PCP: Rogelia Boga, MD  Summary: 82 year old woman with recent hospitalization for sepsis secondary to UTI, discharge to skilled nursing facility presented 12/30 with increasing confusion, agitation, decreased oral intake, refusing to take medications, combativeness at skilled nursing facility. She was admitted for dehydration with hypernatremia, acute encephalopathy. She was hydrated, sodium normalized, electrolytes were placed. Despite this, remained confused with behavioral abnormalities and hallucinations. Psychiatry was consulted the patient was started on risperidone for paranoia and Remeron for insomnia. Felt to be appropriate for skilled nursing facility transfer when medically stable. Patient refused to participate in care, refused medications which were changed to parenteral administration. Neurology  consulted. Patient could not tolerate MRI or EEG secondary to agitation and non-cooperative behavior.  Assessment/Plan: 1. Paranoid psychosis, acute encephalopathy, persistent. Initially thought to be related to hypernatremia. Including hallucinations; slowly improving. Less agitated and calmer. Patient somewhat somnolent and able to follow simple commands intermittently. Still refusing medications. Being treated for acute psychosis. TSH, B12, RPR unremarkable. Current medications include Zyprexa IM twice a day, Ativan 1 mg by mouth daily at bedtime as needed. Unable to obtain MRI or EEG. Neurology pursuing further evaluation to assess for possible neurodegenerative process; but until today patient was not cooperative. 2. Mild hypothermia, likely environmental; vitals stable, resolved now. Will monitor 3. Possible acute hypoxic respiratory failure. CXR NAD.  Improving and with 2L O2 sat in mid 90's 4. Dehydration with hypernatremia. Resolved. Thought secondary to poor oral intake and continue use of diuretics.  Discontinue hydrochlorothiazide indefinitely. 5. PMH ASCVD status post AAA repair and CABG; RA; chronic diastolic CHF, chronic venous stasis ulcers, pancytopenia. Overall stable. Resume home meds as able to be taken by mouth. 6. Dysphagia. On nectar thick liquids and dysphagia 1 diet. SPL on board. Very high risk for aspiration. 7. Multiple bilateral LE ulcers present on admission. Excellent BLE pulses. Appears to be secondary to venous insufficiency. Continue wound care rec's 8. Dementia with behavioral disturbance. Continue supportive care and psych rec's for meds. 9. Progressive gait instability of unclear etiology, peripheral neuropathy. PT and rehab, when able to participate. 10. Discharged 12/26: acute encephalopathy, UTI with sepsis. Continue declining at SNF, refusing medications and become really agitated. Patient was readmitted on 12/30.   Patient is calmer and somewhat somnolent today. Able to follow some simple commands and eating pudding with her daughter. Still refusing some of her meds.  No further episodes of hypothermia  Trend SpO2, and wean oxygen as tolerated. CXR clear. Patient 96-98% on 2 L  CBC and BMP WNL. No signs of infections, electrolytes abnormalities or renal failure.  Will d/w psychiatry, will need further assistance; until psychosis has stabilized further workup will be limited by non-cooperative behavior and refused of medications. Continue Zyprexa and PRN Ativan.   Metoprolol IV started for rebound tachycardia and help controlling BP.   Code Status: DNR DVT prophylaxis: TED hose Family Communication: discussed with Son and Daughter at bedside Disposition Plan: SNF when stable.  Vassie Loll, MD  [facility]  Pager [phone]  If 7PM-7AM, please contact night-coverage at [email or web address], password [facility] 11/29/2015, 9:23 PM  LOS: 5 days   Consultants:  Physical therapy: Skilled nursing facility.  Speech therapy: Dysphagia 1 (Puree);Nectar-thick  liquid  Liquid Administration via: Cup;Straw Medication Administration: Crushed with puree Supervision: Staff to assist with self feeding;Full supervision/cueing for compensatory strategies Compensations: Slow rate;Small sips/bites Postural Changes: Seated upright at 90 degrees   Procedures:  See below for x-ray  reports   Antibiotics:  HPI/Subjective: Somnolent, but able to follow some simple commands. More calmer. Afebrile. No acute distress appreciated. Still refusing some of her meds by mouth. Rebound tachycardia appreciated.  Objective: Filed Vitals:   11/29/15 0647 11/29/15 1417 11/29/15 1900 11/29/15 2011  BP: 151/65 123/82  126/47  Pulse: 113 113  78  Temp: 99 F (37.2 C) 98.5 F (36.9 C)  98.9 F (37.2 C)  TempSrc: Axillary Axillary  Axillary  Resp: 18 18  18   Height:   5\' 2"  (1.575 m)   Weight:      SpO2: 92% 100%  95%    Intake/Output Summary (Last 24 hours) at 11/29/15 2123 Last data filed at 11/29/15 1700  Gross per 24 hour  Intake 366.25 ml  Output    300 ml  Net  66.25 ml     Filed Weights   11/25/15 0444 11/26/15 0628  Weight: 49.1 kg (108 lb 3.9 oz) 49.6 kg (109 lb 5.6 oz)    Exam:    General:somnolent, but arousable; was eating some pudding with her daughter; able to follow simple commands. A lot calmer ENT: grossly normal hearing, lips dry, no thrush or erythema appreciated Cardiovascular: sinus tachycardia, no m/r/g. No LE edema. DP pulses 2+ bilaterally Respiratory:good air movement, no wheezing, increase upper secretions. Abdomenn: soft, n, tnd Skin: no rash or induration noted Musculoskeletal: grossly normal tone BUE/BLE; moves all extremities spontaneously Psychiatric: somnolent, but arousable, was able to follow some commands, calmer. Neurologic: grossly non-focal but very limited.  New data reviewed:  Basic metabolic panel unremarkable   CXR NAD  CBC with normal WBC's  Scheduled Meds: . aspirin EC  81 mg Oral Daily  .  cholecalciferol  1,000 Units Oral Daily  . enoxaparin (LOVENOX) injection  30 mg Subcutaneous Q24H  . feeding supplement (ENSURE ENLIVE)  237 mL Oral Q24H  . [START ON 11/30/2015] feeding supplement (PRO-STAT SUGAR FREE 64)  30 mL Oral Daily  . fluconazole  100 mg Oral Daily  . folic acid  1 mg Oral BID  . hydroxychloroquine  200 mg Oral BID  . LORazepam  1 mg Intramuscular QHS  . methotrexate  15 mg Oral Weekly  . metoprolol  5 mg Intravenous 4 times per day  . multivitamin  5 mL Oral Daily  . OLANZapine  2.5 mg Intramuscular BID  . senna-docusate  1 tablet Oral BID  . simvastatin  40 mg Oral Daily   Continuous Infusions: . sodium chloride 75 mL/hr at 11/29/15 01/28/2016    Principal Problem:   Paranoid psychosis (HCC) Active Problems:   Essential hypertension   CORONARY ATHEROSCLEROSIS, ARTERY BYPASS GRAFT   Rheumatoid arthritis (HCC)   Pressure ulcer   Malnutrition of moderate degree   Acute encephalopathy   Chronic diastolic congestive heart failure (HCC)   Dehydration with hypernatremia   Hypernatremia   Other affective psychosis   Time spent 30 minutes

## 2015-11-29 NOTE — Progress Notes (Signed)
Physical Therapy Treatment Patient Details Name: [name] MRN: [id number] DOB: [birth year] Today's Date: 11/29/2015    History of Present Illness 82 yo female admitted with dehydration, hypernatremia, worsening confusion and poor intake.  She was at [facility] in Dec '16 for encephalopathy and sepsis due to UTI.  She had been at [facility] until this admission.  Pt also being tx'd for acute psychosis during this admission.   Hx of RA, CABG, chronic low back pain.    PT Comments    Pt remains somewhat lethargic but she participate fairly well with session. No agitation. Continues to require +2 assist. Sat EOB at least 10 minutes. Total assist for all mobility tasks. Recommend return to SNF.   Follow Up Recommendations  SNF     Equipment Recommendations  None recommended by PT    Recommendations for Other Services       Precautions / Restrictions Precautions Precautions: Fall Precaution Comments: can be combative per chart notes; calm at time of eval Restrictions Weight Bearing Restrictions: No    Mobility  Bed Mobility Overal bed mobility: Needs Assistance Bed Mobility: Supine to Sit;Sit to Supine Rolling: Total assist   Supine to sit: Total assist;+2 for physical assistance;+2 for safety/equipment;HOB elevated Sit to supine: Total assist;+2 for physical assistance;+2 for safety/equipment;HOB elevated   General bed mobility comments: Assist for trunk and bil LEs. Utilized bed pad. Increased time.   Transfers                 General transfer comment: unable  Ambulation/Gait                 Stairs            Wheelchair Mobility    Modified Rankin (Stroke Patients Only)       Balance Overall balance assessment: Needs assistance Sitting-balance support: Feet supported;No upper extremity supported Sitting balance-Leahy Scale: Zero Sitting balance - Comments: total support from therapist; flexed posture; unable to hold head up. Multidirectional LOB.                             Cognition Arousal/Alertness: Awake/alert Behavior During Therapy: Flat affect Overall Cognitive Status: Difficult to assess Area of Impairment: Orientation;Attention;Memory;Following commands;Safety/judgement;Problem solving   Current Attention Level: Focused           General Comments: pt nodded when I told her she was at [facility]    Exercises Other Exercises Other Exercises: performed 5 reps, 2 sets of FF, AAROM; pt iniitiated movement with cues. (strength 2-/5).  Also initiated grip with R hand only; strength 3+.  Strength greater from digits 4 and 5    General Comments        Pertinent Vitals/Pain Pain Assessment: No/denies pain Faces Pain Scale: Hurts little more (when assisted back to bed but denied pain when asked) Pain Descriptors / Indicators: Grimacing    Home Living Family/patient expects to be discharged to:: Skilled nursing facility                    Prior Function Level of Independence: Needs assistance      Comments: recently discharged to SNF   PT Goals (current goals can now be found in the care plan section) Acute Rehab PT Goals Patient Stated Goal: none stated Progress towards PT goals: Progressing toward goals (very slowly)    Frequency  Min 2X/week    PT Plan Current plan remains appropriate  Co-evaluation   Reason for Co-Treatment: Complexity of the patient's impairments (multi-system involvement);For patient/therapist safety PT goals addressed during session: Mobility/safety with mobility;Balance OT goals addressed during session: ADL's and self-care;Strengthening/ROM     End of Session   Activity Tolerance: Patient tolerated treatment well Patient left: in bed;with call bell/phone within reach;with bed alarm set;with restraints reapplied;with family/visitor present     Time: 7494-4967 PT Time Calculation (min) (ACUTE ONLY): 24 min  Charges:  $Therapeutic Activity: 8-22 mins                     G Codes:     Rebeca Alert, MPT Pager: [phone]

## 2015-11-30 DIAGNOSIS — I25708 Atherosclerosis of coronary artery bypass graft(s), unspecified, with other forms of angina pectoris: Secondary | ICD-10-CM

## 2015-11-30 DIAGNOSIS — I5032 Chronic diastolic (congestive) heart failure: Secondary | ICD-10-CM

## 2015-11-30 DIAGNOSIS — M05712 Rheumatoid arthritis with rheumatoid factor of left shoulder without organ or systems involvement: Secondary | ICD-10-CM

## 2015-11-30 DIAGNOSIS — M05711 Rheumatoid arthritis with rheumatoid factor of right shoulder without organ or systems involvement: Secondary | ICD-10-CM

## 2015-11-30 DIAGNOSIS — E440 Moderate protein-calorie malnutrition: Secondary | ICD-10-CM

## 2015-11-30 LAB — CREATININE, SERUM
CREATININE: 0.69 mg/dL (ref 0.44–1.00)
GFR calc Af Amer: >60 mL/min (ref >60)

## 2015-11-30 NOTE — Progress Notes (Signed)
Progress Note   ASHAUNA BERTHOLF LKT:[id number] DOB: 10-Sep-1932 DOA: 11/23/2015 PCP: Rogelia Boga, MD   Brief Narrative:   [name] is an 82 y.o. female with recent hospitalization for sepsis secondary to UTI, discharged to SNF, re-admitted 11/23/15  with increasing confusion, agitation, decreased oral intake, refusing to take medications, combativeness. She was admitted for dehydration with hypernatremia, acute encephalopathy. She was hydrated, sodium normalized, electrolytes were replaced. Despite this, remained confused with behavioral abnormalities and hallucinations. Psychiatry was consulted, started on risperidone for paranoia and Remeron for insomnia. Felt to be appropriate for SNF transfer when medically stable. Patient refused to participate in care, refused medications which were changed to parenteral administration. Neurology consulted. Patient could not tolerate MRI or EEG secondary to agitation and non-cooperative behavior.  Assessment/Plan:   Principal Problem:   Paranoid psychosis (HCC) and acute encephalopathy in the setting of hypernatremia and dehydration - Psychiatry and neurology consulted after patient continued to have AMS despite correction of dehydration and electrolytes.  - HCTZ discontinued. - TSH, B-12 and RPR unremarkable. - Unable to obtain MRI or EEG secondary to ongoing agitation and lack of cooperation. - Continue Zyprexa and as needed Ativan. Not felt to be appropriate for inpatient psychiatric treatment.  Active Problems:   Dysphasia - Currently on a full liquid diet. - Maintain aspiration precautions.    Acute hypoxic respiratory failure - Chest x-ray clear. Oxygen saturation stable on low-flow oxygen. - Continue bronchodilators as needed.    Essential hypertension - Metoprolol as needed ordered.    CORONARY ATHEROSCLEROSIS, ARTERY BYPASS GRAFT  - Continue aspirin and Zocor. - Discontinue telemetry.    Rheumatoid arthritis  (HCC) - Continue Plaquenil and methotrexate.    Pressure ulcer - Wound care per wound care RN recommendations.    Malnutrition of moderate degree - Continue nutritional supplements and protein supplement. - Continue multivitamin.    Chronic diastolic congestive heart failure (HCC) - Appears stable.    Other affective psychosis/dementia with behavioral disturbance - Continue Zyprexa and when necessary Ativan.    DVT Prophylaxis - Lovenox ordered.   Family Communication/Anticipated D/C date and plan/Code Status   Family Communication: No family at the bedside. Disposition Plan: SNF when mental status stable, currently somnolant.  Given lack of improvement, multiple co-morbidities, will request palliative care consult. Anticipated D/C date:   2-3 days. Code Status: DO NOT RESUSCITATE.   IV Access:    Peripheral IV   Procedures and diagnostic studies:   Dg Chest Port 1 View  2015-12-09  CLINICAL DATA:  Combative.  No history obtainable EXAM: PORTABLE CHEST 1 VIEW COMPARISON:  12/30 /16 FINDINGS: Sternotomy wires overlie normal cardiac silhouette. Lungs are hyperinflated. Chronic bronchitic markings. There is remote posterior RIGHT rib fractures with nonunion. No pneumothorax. Healed rib fracture also noted on the LEFT IMPRESSION: 1. No acute cardiopulmonary findings. 2. Hyperinflated lungs with chronic bronchitic markings. 3. Posterior RIGHT rib fractures with nonunion. No change from CT 11/17/2015. Electronically Signed   By: Genevive Bi M.D.   On: 12-09-15 17:10   Mr Attempted Valora Corporal Report  11/27/2015  This examination belongs to an outside facility and is stored here for comparison purposes only.  Contact the originating outside institution for any associated report or interpretation.    Medical Consultants:    None.  Anti-Infectives:   Diflucan 11/23/15---> 11/30/15   Subjective:   [name] is not responsive.  RN reports she gave her an IM shot  without any withdrawal to  pain.  She was given Zyprexa this morning, but was up all night with agitation.  She has not been eating or drinking.  Objective:    Filed Vitals:   11/29/15 1900 11/29/15 2011 11/30/15 0500 11/30/15 0538  BP:  126/47  112/47  Pulse:  78  114  Temp:  98.9 F (37.2 C)  98.6 F (37 C)  TempSrc:  Axillary  Axillary  Resp:  18  18  Height: 5\' 2"  (1.575 m)     Weight:   43.8 kg (96 lb 9 oz)   SpO2:  95%  93%    Intake/Output Summary (Last 24 hours) at 11/30/15 0805 Last data filed at 11/30/15 0600  Gross per 24 hour  Intake 1753.75 ml  Output    600 ml  Net 1153.75 ml   Filed Weights   11/25/15 0444 11/26/15 0628 11/30/15 0500  Weight: 49.1 kg (108 lb 3.9 oz) 49.6 kg (109 lb 5.6 oz) 43.8 kg (96 lb 9 oz)    Exam: Gen:  NAD, somnolant Cardiovascular:  RRR, No M/R/G Respiratory:  Lungs CTAB Gastrointestinal:  Abdomen soft, NT/ND, + BS Extremities:  No C/E/C   Data Reviewed:    Labs: Basic Metabolic Panel:  Recent Labs Lab 11/24/15 0755 11/25/15 0910 11/26/15 0845 11/28/15 0740 11/30/15 0515  NA 151* 147* 142 140  --   K 2.9* 3.2* 4.6 5.0  --   CL 105 104 103 102  --   CO2 37* 33* 30 31  --   GLUCOSE 119* 105* 89 99  --   BUN 27* 18 13 9   --   CREATININE 0.62 0.62 0.54 0.51 0.69  CALCIUM 8.7* 8.4* 8.9 9.5  --   MG 2.5*  --   --   --   --    GFR Estimated Creatinine Clearance: 38.1 mL/min (by C-G formula based on Cr of 0.69).  CBC:  Recent Labs Lab 11/24/15 0755 11/25/15 0910 11/26/15 0845 11/28/15 2007  WBC 11.2* 7.7 7.0 4.4  HGB 11.1* 9.8* 11.5* 11.3*  HCT 36.9 32.4* 37.0 35.8*  MCV 91.3 89.8 89.6 87.1  PLT 341 306 359 296    No results for input(s): VITAMINB12, FOLATE, FERRITIN, TIBC, IRON, RETICCTPCT in the last 72 hours. Sepsis Labs:  Recent Labs Lab 11/24/15 0755 11/25/15 0910 11/26/15 0845 11/28/15 2007  WBC 11.2* 7.7 7.0 4.4  LATICACIDVEN  --   --   --  0.9   Microbiology No results found for this or  any previous visit (from the past 240 hour(s)).   Medications:   . aspirin EC  81 mg Oral Daily  . cholecalciferol  1,000 Units Oral Daily  . enoxaparin (LOVENOX) injection  30 mg Subcutaneous Q24H  . feeding supplement (ENSURE ENLIVE)  237 mL Oral Q24H  . feeding supplement (PRO-STAT SUGAR FREE 64)  30 mL Oral Daily  . fluconazole  100 mg Oral Daily  . folic acid  1 mg Oral BID  . hydroxychloroquine  200 mg Oral BID  . LORazepam  1 mg Intravenous QHS  . methotrexate  15 mg Oral Weekly  . metoprolol  5 mg Intravenous 4 times per day  . multivitamin  5 mL Oral Daily  . OLANZapine  2.5 mg Intramuscular BID  . senna-docusate  1 tablet Oral BID  . simvastatin  40 mg Oral Daily   Continuous Infusions: . sodium chloride 75 mL/hr at 11/29/15 0637    Time spent: 25 minutes.   LOS: 6 days  RAMA,CHRISTINA  [facility] Pager [phone]. If unable to reach me by pager, please call my cell phone at [phone].  *Please refer to [email or web address], password [email or web address] to get updated schedule on who will round on this patient, as hospitalists switch teams weekly. If 7PM-7AM, please contact night-coverage at [email or web address], password [email or web address] for any overnight needs.  11/30/2015, 8:05 AM

## 2015-12-01 DIAGNOSIS — F22 Delusional disorders: Secondary | ICD-10-CM

## 2015-12-01 NOTE — Progress Notes (Addendum)
PROGRESS NOTE    [name] BOF:[id number] DOB: [birth year] DOA: 11/23/2015 PCP: Rogelia Boga, MD  HPI/Brief narrative 82 y.o. female , recent hospitalization 11/13/15-11/19/15 with diagnosis of acute encephalopathy, sepsis secondary to UTI, was discharged to SNF, returns to the [facility] ED on 11/23/15 with complaints of worsening confusion/combativeness and decreased oral intake. She has PMH of ASCVD, CABG, AAA repair, rheumatoid arthritis, chronic low back pain, chronic diastolic CHF, chronic lower extremity venous stasis ulcers, HTN. She was admitted for acute encephalopathy and dehydration with hypernatremia. Hypernatremia and electrolyte abnormalities have resolved. Despite this patient remained confused, agitated with behavioral abnormalities and hallucinations. Psychiatry was consulted and patient was started on IM Zyprexa (refuses to consistently take oral medications). Recent extensive workup for altered mental status has been negative (negative RPR, TSH, B12). Neurology was consulted but patient unable to cooperate for MRI brain on EEG.   Assessment/Plan:  Paranoid psychosis/likely underlying dementia with behavioral disturbance - Patient was initially felt to have acute encephalopathy related to dehydration and hypernatremia.  - Despite correction of same, there was no improvement in her mental status. She continued to be combative, kicking, screaming, spitting at staff.  - Psychiatry was consulted. Briefly on IV Haldol-discontinued by psychiatry because of lack of effectiveness. Patient consistently refusing to take oral medications. Eventually psychiatry started her on Zyprexa IM. TSH, B12, RPR recently unremarkable. Neurology was consulted but patient could not cooperate for MRI brain on EEG. - Some improvement. As per RN, not as agitated as she was a couple days ago. - Due to lack of steady improvement, palliative care consulted-input  pending.  Dysphagia - Currently on full liquid diet. Maintain aspiration precautions as much as possible.  Acute respiratory failure with hypoxia - Chest x-ray without acute abnormalities. Wean oxygen as tolerated to maintain saturations >92%.  Essential hypertension - Fluctuating and mildly uncontrolled. HCTZ discontinued indefinitely  CAD status post CABG - Continue aspirin and Zocor  Rheumatoid arthritis - Continue Plaquenil and methotrexate  Chronic diastolic CHF - Clinically compensated.  Chronic bilateral lower extremity ulcers secondary to venous stasis/stage II sacral decubitus ulcer - Evaluated by wound care team this admission. Appeared clean.  Malnutrition of moderate degree  Mild hypothermia - Unclear etiology but resolved.  Anemia - stable  Hypokalemia - Replaced   DVT prophylaxis: Lovenox Code Status: DO NOT RESUSCITATE Family Communication: None at bedside Disposition Plan: To be determined   Consultants:  Psychiatry  Neurology  Procedures:  None  Antibiotics:  None   Subjective: Confused-appears semi-alert and mumbling incomprehensibly. As per RN, less agitated compared to a few days ago but still not fully cooperative.  Objective: Filed Vitals:   11/30/15 1524 11/30/15 1735 11/30/15 2214 12/01/15 0610  BP: 125/60 125/62 153/52 151/62  Pulse: 70  94 105  Temp: 98.3 F (36.8 C)  98.2 F (36.8 C) 98 F (36.7 C)  TempSrc: Axillary  Axillary Axillary  Resp: 20  18 20   Height:      Weight:      SpO2: 96%  97% 98%    Intake/Output Summary (Last 24 hours) at 12/01/15 1520 Last data filed at 12/01/15 0830  Gross per 24 hour  Intake      0 ml  Output   1300 ml  Net  -1300 ml   Filed Weights   11/25/15 0444 11/26/15 0628 11/30/15 0500  Weight: 49.1 kg (108 lb 3.9 oz) 49.6 kg (109 lb 5.6 oz) 43.8 kg (96 lb 9 oz)  Exam:  General exam: Small built, frail, chronically ill-looking female lying comfortably supine in bed. Not  fully cooperative with exam. Respiratory system: Reduced breath sounds in the bases but otherwise clear to auscultation/poor inspiratory effort. No increased work of breathing. Cardiovascular system: S1 & S2 heard, RRR. No JVD, murmurs, gallops, clicks or pedal edema. Gastrointestinal system: Abdomen is nondistended, soft and nontender. Normal bowel sounds heard. Central nervous system: Semi alert - opens eyes, briefly looks at you and mumbles incomprehensively. Does not follow instructions. No focal neurological deficits. Not agitated currently. Extremities: Symmetric 5 x 5 power.   Data Reviewed: Basic Metabolic Panel:  Recent Labs Lab 11/25/15 0910 11/26/15 0845 11/28/15 0740 11/30/15 0515  NA 147* 142 140  --   K 3.2* 4.6 5.0  --   CL 104 103 102  --   CO2 33* 30 31  --   GLUCOSE 105* 89 99  --   BUN 18 13 9   --   CREATININE 0.62 0.54 0.51 0.69  CALCIUM 8.4* 8.9 9.5  --    Liver Function Tests: No results for input(s): AST, ALT, ALKPHOS, BILITOT, PROT, ALBUMIN in the last 168 hours. No results for input(s): LIPASE, AMYLASE in the last 168 hours. No results for input(s): AMMONIA in the last 168 hours. CBC:  Recent Labs Lab 11/25/15 0910 11/26/15 0845 11/28/15 2007  WBC 7.7 7.0 4.4  HGB 9.8* 11.5* 11.3*  HCT 32.4* 37.0 35.8*  MCV 89.8 89.6 87.1  PLT 306 359 296   Cardiac Enzymes: No results for input(s): CKTOTAL, CKMB, CKMBINDEX, TROPONINI in the last 168 hours. BNP (last 3 results) No results for input(s): PROBNP in the last 8760 hours. CBG: No results for input(s): GLUCAP in the last 168 hours.  No results found for this or any previous visit (from the past 240 hour(s)).         Studies: No results found.      Scheduled Meds: . aspirin EC  81 mg Oral Daily  . cholecalciferol  1,000 Units Oral Daily  . enoxaparin (LOVENOX) injection  30 mg Subcutaneous Q24H  . feeding supplement (ENSURE ENLIVE)  237 mL Oral Q24H  . feeding supplement (PRO-STAT  SUGAR FREE 64)  30 mL Oral Daily  . folic acid  1 mg Oral BID  . hydroxychloroquine  200 mg Oral BID  . LORazepam  1 mg Intravenous QHS  . methotrexate  15 mg Oral Weekly  . metoprolol  5 mg Intravenous 4 times per day  . multivitamin  5 mL Oral Daily  . OLANZapine  2.5 mg Intramuscular BID  . senna-docusate  1 tablet Oral BID  . simvastatin  40 mg Oral Daily   Continuous Infusions: . sodium chloride 75 mL/hr at 11/30/15 1216    Principal Problem:   Paranoid psychosis (HCC) Active Problems:   Essential hypertension   CORONARY ATHEROSCLEROSIS, ARTERY BYPASS GRAFT   Rheumatoid arthritis (HCC)   Pressure ulcer   Malnutrition of moderate degree   Acute encephalopathy   Chronic diastolic congestive heart failure (HCC)   Dehydration with hypernatremia   Hypernatremia   Other affective psychosis    Time spent: 25 minutes.    1217, MD, FACP, FHM. [facility] Pager [phone]  If 7PM-7AM, please contact night-coverage [email or web address] Password [email or web address] 12/01/2015, 3:20 PM    LOS: 7 days

## 2015-12-01 NOTE — Progress Notes (Addendum)
Palliative Medicine Team consult was received.   I stopped by to meet with [name] today.  She remains confused and cannot give any history.  I left VM for her family and am awaiting return call.  We will schedule a meeting with patient and her family at the earliest possible time family is available and we have a provider available. If there are urgent needs or questions please call [phone]. Thank you for consulting out team to assist with this patients care.  Romie Minus, MD [facility] Palliative Medicine Team [phone]

## 2015-12-02 DIAGNOSIS — R627 Adult failure to thrive: Secondary | ICD-10-CM

## 2015-12-02 LAB — BASIC METABOLIC PANEL
ANION GAP: 12 (ref 5–15)
BUN: 9 mg/dL (ref 6–20)
CO2: 26 mmol/L (ref 22–32)
CREATININE: 0.58 mg/dL (ref 0.44–1.00)
Calcium: 8.6 mg/dL — ABNORMAL LOW (ref 8.9–10.3)
Chloride: 108 mmol/L (ref 101–111)
GFR calc non Af Amer: >60 mL/min (ref >60)
Glucose, Bld: 77 mg/dL (ref 65–99)
POTASSIUM: 3.4 mmol/L — AB (ref 3.5–5.1)
Sodium: 146 mmol/L — ABNORMAL HIGH (ref 135–145)

## 2015-12-02 MED ORDER — DEXTROSE 5 % IV SOLN
INTRAVENOUS | Status: DC
  Administered 2015-12-02: 08:00:00 via INTRAVENOUS
  Filled 2015-12-02: qty 1000

## 2015-12-02 MED ORDER — POTASSIUM CHLORIDE 10 MEQ/100ML IV SOLN
10.0000 meq | INTRAVENOUS | Status: AC
  Administered 2015-12-02 (×3): 10 meq via INTRAVENOUS
  Filled 2015-12-02 (×3): qty 100

## 2015-12-02 MED ORDER — POTASSIUM CHLORIDE 2 MEQ/ML IV SOLN
INTRAVENOUS | Status: DC
  Administered 2015-12-03: 06:00:00 via INTRAVENOUS
  Filled 2015-12-02 (×2): qty 1000

## 2015-12-02 NOTE — Progress Notes (Signed)
I called and spoke with Ms. Melamed son, [name].  He is agreeable to meeting for a family meeting tomorrow.  He will call and speak with his sister, Judeth Cornfield, and will call back with a time they can meet.  If there are urgent needs or questions please call [phone]. Thank you for consulting out team to assist with this patients care.  Romie Minus, MD [facility] Palliative Medicine Team [phone]

## 2015-12-02 NOTE — Progress Notes (Signed)
PROGRESS NOTE    [name] TMH:[id number] DOB: Dec 11, 1931 DOA: 11/23/2015 PCP: Rogelia Boga, MD  HPI/Brief narrative 82 y.o. female , recent hospitalization 11/13/15-11/19/15 with diagnosis of acute encephalopathy, sepsis secondary to UTI, was discharged to SNF, returns to the [facility] ED on 11/23/15 with complaints of worsening confusion/combativeness and decreased oral intake. She has PMH of ASCVD, CABG, AAA repair, rheumatoid arthritis, chronic low back pain, chronic diastolic CHF, chronic lower extremity venous stasis ulcers, HTN. She was admitted for acute encephalopathy and dehydration with hypernatremia. Hypernatremia and electrolyte abnormalities have resolved. Despite this patient remained confused, agitated with behavioral abnormalities and hallucinations. Psychiatry was consulted and patient was started on IM Zyprexa (refuses to consistently take oral medications). Recent extensive workup for altered mental status has been negative (negative RPR, TSH, B12). Neurology was consulted but patient unable to cooperate for MRI brain on EEG.   Assessment/Plan:  Paranoid psychosis/likely underlying dementia with behavioral disturbance - Patient was initially felt to have acute encephalopathy related to dehydration and hypernatremia.  - Despite correction of same, there was no improvement in her mental status. She continued to be combative, kicking, screaming, spitting at staff.  - Psychiatry was consulted. Briefly on IV Haldol-discontinued by psychiatry because of lack of effectiveness. Patient consistently refusing to take oral medications. Eventually psychiatry started her on Zyprexa IM. TSH, B12, RPR recently unremarkable. Neurology was consulted but patient could not cooperate for MRI brain on EEG. - Some improvement. As per RN, not as agitated or combative as she was a couple days ago. - Due to lack of steady improvement, palliative care consulted-input  pending. Discussed with palliative M.D. on 1/8-attempting to reach family for meeting. - Continued failure to thrive with no consistent improvement since admission on 11/23/15. Continued confusion, intermittent agitation, lack of consistent and good oral intake of feeds and medications. Appears to be still having visual hallucinations.  Dysphagia - Currently on full liquid diet. Maintain aspiration precautions as much as possible.  Acute respiratory failure with hypoxia - Chest x-ray without acute abnormalities. Wean oxygen as tolerated to maintain saturations >92%.  Essential hypertension - Fluctuating and mildly uncontrolled. HCTZ discontinued indefinitely. Currently on scheduled IV metoprolol. Controlled.  CAD status post CABG - Continue aspirin and Zocor  Rheumatoid arthritis - Continue Plaquenil and methotrexate  Chronic diastolic CHF - Clinically compensated.  Chronic bilateral lower extremity ulcers secondary to venous stasis/stage II sacral decubitus ulcer - Evaluated by wound care team this admission. Appeared clean.  Malnutrition of moderate degree  Mild hypothermia - Unclear etiology but resolved.  Anemia - stable  Hypokalemia - Replace and follow as needed.  Recurrent hypernatremia - Most likely related to poor oral free water intake and dehydration. IV D5W. Follow   DVT prophylaxis: Lovenox Code Status: DO NOT RESUSCITATE Family Communication: Discussed extensively with patient's son Mr. [name] on 12/02/15. Extensively updated care and answered questions. Disposition Plan: To be determined   Consultants:  Psychiatry  Neurology  Palliative care team-input pending.  Procedures:  None  Antibiotics:  None   Subjective: On walking into room, patient mumbling incomprehensibly and pointing to and seems to be seeing something on the ceiling. As per nursing, intermittent agitation but less combative compared to several days  ago.  Objective: Filed Vitals:   12/01/15 1445 12/01/15 2327 12/02/15 0218 12/02/15 0513  BP: 118/56 130/60 122/42 149/74  Pulse: 95 89 79 90  Temp: 98.6 F (37 C) 97.8 F (36.6 C) 98.2 F (36.8 C) 98  F (36.7 C)  TempSrc: Axillary Axillary Axillary Axillary  Resp: 20 21 21 22   Height:      Weight:    52.3 kg (115 lb 4.8 oz)  SpO2: 98% 97% 96% 97%    Intake/Output Summary (Last 24 hours) at 12/02/15 1440 Last data filed at 12/02/15 0513  Gross per 24 hour  Intake      0 ml  Output    500 ml  Net   -500 ml   Filed Weights   11/26/15 0628 11/30/15 0500 12/02/15 0513  Weight: 49.6 kg (109 lb 5.6 oz) 43.8 kg (96 lb 9 oz) 52.3 kg (115 lb 4.8 oz)     Exam:  General exam: Small built, frail, chronically ill-looking female lying comfortably supine in bed. Not fully cooperative with exam. Keeps pushing of the examiner's hand. Tongue is dry and coated. Respiratory system: Reduced breath sounds in the bases but otherwise clear to auscultation/poor inspiratory effort. No increased work of breathing. Cardiovascular system: S1 & S2 heard, RRR. No JVD, murmurs, gallops, clicks or pedal edema. Gastrointestinal system: Abdomen is nondistended, soft and nontender. Normal bowel sounds heard. Central nervous system:  Does not follow instructions. No focal neurological deficits. Not agitated currently. Patient mumbling incomprehensibly and pointing to and seems to be seeing something on the ceiling. Extremities: Symmetric 5 x 5 power.   Data Reviewed: Basic Metabolic Panel:  Recent Labs Lab 11/26/15 0845 11/28/15 0740 11/30/15 0515 12/02/15 0513  NA 142 140  --  146*  K 4.6 5.0  --  3.4*  CL 103 102  --  108  CO2 30 31  --  26  GLUCOSE 89 99  --  77  BUN 13 9  --  9  CREATININE 0.54 0.51 0.69 0.58  CALCIUM 8.9 9.5  --  8.6*   Liver Function Tests: No results for input(s): AST, ALT, ALKPHOS, BILITOT, PROT, ALBUMIN in the last 168 hours. No results for input(s): LIPASE, AMYLASE  in the last 168 hours. No results for input(s): AMMONIA in the last 168 hours. CBC:  Recent Labs Lab 11/26/15 0845 11/28/15 2007  WBC 7.0 4.4  HGB 11.5* 11.3*  HCT 37.0 35.8*  MCV 89.6 87.1  PLT 359 296   Cardiac Enzymes: No results for input(s): CKTOTAL, CKMB, CKMBINDEX, TROPONINI in the last 168 hours. BNP (last 3 results) No results for input(s): PROBNP in the last 8760 hours. CBG: No results for input(s): GLUCAP in the last 168 hours.  No results found for this or any previous visit (from the past 240 hour(s)).         Studies: No results found.      Scheduled Meds: . aspirin EC  81 mg Oral Daily  . cholecalciferol  1,000 Units Oral Daily  . enoxaparin (LOVENOX) injection  30 mg Subcutaneous Q24H  . feeding supplement (ENSURE ENLIVE)  237 mL Oral Q24H  . feeding supplement (PRO-STAT SUGAR FREE 64)  30 mL Oral Daily  . folic acid  1 mg Oral BID  . hydroxychloroquine  200 mg Oral BID  . LORazepam  1 mg Intravenous QHS  . methotrexate  15 mg Oral Weekly  . metoprolol  5 mg Intravenous 4 times per day  . multivitamin  5 mL Oral Daily  . OLANZapine  2.5 mg Intramuscular BID  . senna-docusate  1 tablet Oral BID  . simvastatin  40 mg Oral Daily   Continuous Infusions: . sodium chloride 75 mL/hr at 11/30/15 1216  .  dextrose 5 % 1,000 mL with potassium chloride 40 mEq infusion 50 mL/hr at 12/02/15 0747    Principal Problem:   Paranoid psychosis (HCC) Active Problems:   Essential hypertension   CORONARY ATHEROSCLEROSIS, ARTERY BYPASS GRAFT   Rheumatoid arthritis (HCC)   Pressure ulcer   Malnutrition of moderate degree   Acute encephalopathy   Chronic diastolic congestive heart failure (HCC)   Dehydration with hypernatremia   Hypernatremia   Other affective psychosis    Time spent: 25 minutes.    Marcellus Scott, MD, FACP, FHM. [facility] Pager [phone]  If 7PM-7AM, please contact night-coverage [email or web address] Password [email or web address] 12/02/2015,  2:40 PM    LOS: 8 days

## 2015-12-03 LAB — BASIC METABOLIC PANEL
Anion gap: 7 (ref 5–15)
BUN: 6 mg/dL (ref 6–20)
CHLORIDE: 104 mmol/L (ref 101–111)
CO2: 30 mmol/L (ref 22–32)
CREATININE: 0.49 mg/dL (ref 0.44–1.00)
Calcium: 8.6 mg/dL — ABNORMAL LOW (ref 8.9–10.3)
GFR calc non Af Amer: >60 mL/min (ref >60)
GLUCOSE: 126 mg/dL — AB (ref 65–99)
Potassium: 3.7 mmol/L (ref 3.5–5.1)
Sodium: 141 mmol/L (ref 135–145)

## 2015-12-03 LAB — MAGNESIUM: Magnesium: 1.5 mg/dL — ABNORMAL LOW (ref 1.7–2.4)

## 2015-12-03 NOTE — Progress Notes (Signed)
PT Cancellation Note  Patient Details Name: [name] MRN: [id number] DOB: January 21, 1932   Cancelled Treatment:    Reason Eval/Treat Not Completed: Other (comment) (Palliative care meeting today. will await GOC.)   Sharen Heck PT [phone]  12/03/2015, 7:46 AM

## 2015-12-03 NOTE — Progress Notes (Signed)
Patient ID: [name], female   DOB: 27-Jul-1932, 82 y.o.   MRN: [id number] [facility] PROGRESS NOTE  [name] KNL:[id number] DOB: 11/26/1931 DOA: 11/23/2015 PCP: Rogelia Boga, MD  Brief narrative:    82 y.o. female with past medical history of ASCVD, CABG, AAA repair, rheumatoid arthritis, chronic low back pain, chronic diastolic CHF, chronic lower extremity venous stasis ulcers, HTN, recent hospitalization 11/13/15-11/19/15 for acute encephalopathy, sepsis secondary to UTI, she was discharged to SNF and then returned to the [facility] ED on 11/23/15 with complaints of worsening confusion/combativeness and decreased oral intake. She was admitted for acute encephalopathy and dehydration with hypernatremia. Hypernatremia and electrolyte abnormalities have resolved but pt continued to be confused and agitated.  Psychiatry was consulted and patient was started on IM Zyprexa (refuses to consistently take oral medications). Recent extensive workup for altered mental status has been negative (negative RPR, TSH, B12). Neurology was consulted but patient unable to cooperate for MRI brain or EEG.  Palliative consulted at this point for goals of care.    Assessment/Plan:    Paranoid psychosis/likely underlying dementia with behavioral disturbance - Initially thought acute encephalopathy related to dehydration and hypernatremia.  - Electrolytes corrected but pt continues to have altered mental status - Haldol not effective so now on IM Zyprexa per psych recommendations - TSH, B12, RPR recently unremarkable. Neurology was consulted but patient could not cooperate for MRI brain or EEG. - Palliative care consulted for goals of care.  Dysphagia - Currently on full liquid diet.  - Continue aspiration precautions.   Acute respiratory failure with hypoxia - Chest x-ray without acute abnormalities.  - Continue oxygen support via [place] to keep O2 sats above 90%  Essential  hypertension - Continue metoprolol   CAD status post CABG - Continue aspirin and Zocor  Rheumatoid arthritis - Continue Plaquenil and methotrexate  Chronic diastolic CHF - Compensated.  Chronic bilateral lower extremity ulcers secondary to venous stasis/stage II sacral decubitus ulcer - Evaluated by wound care team   Malnutrition of moderate degree In the context of chronic illness   Hypokalemia - Supplemented   Recurrent hypernatremia - Dehydration likely contributing to hypernatremia - Improved with fluids    DVT Prophylaxis  - Lovenox subQ   Code Status: DNR/DNI  Family Communication:  Family not at the bedside  Disposition Plan: to SNF versus home likely in next 1-2 days, 1/10 or 1/11  IV access:  Peripheral IV  Procedures and diagnostic studies:    No results found.  Medical Consultants:  Psychiatry Neurology Palliative care team  IAnti-Infectives:   None   Manson Passey, MD  [facility] Pager [phone]  Time spent in minutes: 25 minutes  If 7PM-7AM, please contact night-coverage [email or web address] Password [email or web address] 12/03/2015, 8:19 PM   LOS: 9 days    HPI/Subjective: No acute overnight events. Patient restless.   Objective: Filed Vitals:   12/03/15 0445 12/03/15 0519 12/03/15 1256 12/03/15 1425  BP:  106/50 127/39 131/51  Pulse:  79 85   Temp:  97.6 F (36.4 C) 97.6 F (36.4 C)   TempSrc:  Axillary Axillary   Resp:  20 22   Height:      Weight: 114 lb 10.2 oz (52 kg)     SpO2:  100% 86%     Intake/Output Summary (Last 24 hours) at 12/03/15 2019 Last data filed at 12/03/15 1300  Gross per 24 hour  Intake      0 ml  Output    350  ml  Net   -350 ml    Exam:   General:  Pt is disoriented, incoherently mumbling, not in acute distress  Cardiovascular: Regular rate and rhythm, S1/S2 (+)  Respiratory: Clear to auscultation bilaterally, no wheezing, no crackles, no rhonchi  Abdomen: Soft, non tender, non distended, bowel sounds  present  Extremities: No edema, pulses DP and PT palpable bilaterally  Neuro: Grossly nonfocal  Data Reviewed: Basic Metabolic Panel:  Recent Labs Lab 11/28/15 0740 11/30/15 0515 12/02/15 0513 12/03/15 0540  NA 140  --  146* 141  K 5.0  --  3.4* 3.7  CL 102  --  108 104  CO2 31  --  26 30  GLUCOSE 99  --  77 126*  BUN 9  --  9 6  CREATININE 0.51 0.69 0.58 0.49  CALCIUM 9.5  --  8.6* 8.6*  MG  --   --   --  1.5*   Liver Function Tests: No results for input(s): AST, ALT, ALKPHOS, BILITOT, PROT, ALBUMIN in the last 168 hours. No results for input(s): LIPASE, AMYLASE in the last 168 hours. No results for input(s): AMMONIA in the last 168 hours. CBC:  Recent Labs Lab 11/28/15 2007  WBC 4.4  HGB 11.3*  HCT 35.8*  MCV 87.1  PLT 296   Cardiac Enzymes: No results for input(s): CKTOTAL, CKMB, CKMBINDEX, TROPONINI in the last 168 hours. BNP: Invalid input(s): POCBNP CBG: No results for input(s): GLUCAP in the last 168 hours.  No results found for this or any previous visit (from the past 240 hour(s)).   Scheduled Meds: . aspirin EC  81 mg Oral Daily  . cholecalciferol  1,000 Units Oral Daily  . enoxaparin (LOVENOX) injection  30 mg Subcutaneous Q24H  . feeding supplement (ENSURE ENLIVE)  237 mL Oral Q24H  . feeding supplement (PRO-STAT SUGAR FREE 64)  30 mL Oral Daily  . folic acid  1 mg Oral BID  . hydroxychloroquine  200 mg Oral BID  . LORazepam  1 mg Intravenous QHS  . methotrexate  15 mg Oral Weekly  . metoprolol  5 mg Intravenous 4 times per day  . multivitamin  5 mL Oral Daily  . OLANZapine  2.5 mg Intramuscular BID  . senna-docusate  1 tablet Oral BID  . simvastatin  40 mg Oral Daily

## 2015-12-04 ENCOUNTER — Encounter (HOSPITAL_COMMUNITY): Payer: Self-pay | Admitting: Internal Medicine

## 2015-12-04 DIAGNOSIS — E785 Hyperlipidemia, unspecified: Secondary | ICD-10-CM | POA: Diagnosis present

## 2015-12-04 DIAGNOSIS — M069 Rheumatoid arthritis, unspecified: Secondary | ICD-10-CM

## 2015-12-04 DIAGNOSIS — E876 Hypokalemia: Secondary | ICD-10-CM | POA: Diagnosis present

## 2015-12-04 DIAGNOSIS — J9601 Acute respiratory failure with hypoxia: Secondary | ICD-10-CM | POA: Diagnosis present

## 2015-12-04 NOTE — Clinical Social Work Note (Addendum)
2:30pm- CSW left messages with both of pt's children; her Son [name] [phone] and her daughter Judeth Cornfield [phone]. Pt will need to be placed at SNF soon; [facility] pt was placed prior to her hospitalization] doesn't have any beds available. CSW initiated SNF search and will continue to try and reach family.   3:00pm- CSW received a return call from pt's son, [name]. He would like to review bed offers and discuss placement with his sister prior to making a decision. CSW will update pt's son with bed offers as they come in.    Etta Quill, LCSW [phone] Hospital [facility] & 5E, 5W 31-35 Licensed Clinical Social Worker

## 2015-12-04 NOTE — Progress Notes (Signed)
Speech Language Pathology Treatment: Dysphagia  Patient Details Name: CANIYAH MURLEY MRN: [id number] DOB: 11/01/1932 Today's Date: 12/04/2015 Time: 8786-7672 SLP Time Calculation (min) (ACUTE ONLY): 12 min  Assessment / Plan / Recommendation Clinical Impression  Pt is alert today but did not consistently follow commands and speech was severely dysarthric.  With context, SLP able to comprehend approximately 4/5 pt responses.   Pt stated "I love her", when inquired re: Lyda Kalata on television.    Oral care provided using toothette and oral suction.  Pt with orange-tinged secretions retained on washcloth - presumed spilled from oral cavity.  SLP provided pt with single ice chip boluses and few tsps of soda.  No elicitation of swallow noted despite max verbal/tactile stimulation cues.  Pt did masticate ice chip bolus but did not swallow.  SLP orally suctioned pt due to aspiration risk with excessive oral holding.  Wet voice noted at baseline that did not improve with intake, however pt responds well to cues to clear her throat well.   Pt's swallow ability appears to have worsened over hospital coarse and complicates her ability to meet hydration/nutrition needs.  Note palliative team following pt.  Due to lack of progress, SLP to sign off of this unfortunate pt.  No family present to educate to findings.  Continuing to offer full liquids as pt will accept them with strict aspiration precautions may be indicated for this pt's comfort.  RN educated to SLP findings/recommendations.    HPI HPI: 82 yo female, recent hospitalization 11/13/15-11/19/15 with diagnosis of acute encephalopathy, sepsis secondary to UTI, was discharged to SNF, admitted with dehydration, hypernatremia. Hx of RA, CABG, GERD, PNA (1990s),chronic low back pain.Swallow evaluated last admission 12/23 and patient noted to have oropharyngeal dysphagia impacted by alertness and cognitive status, placed on dysphagia 2 diet with nectar  thick liquid. Acute CXR negative.       SLP Plan  Discharge SLP treatment due to (comment) (lack of progress and worsening swallow ability)     Recommendations  Diet recommendations:  (full liquids for comfort only - oral suction if pt does not swallow) Liquids provided via: Teaspoon Medication Administration: Crushed with puree (consider liquid form or via iv) Supervision:  (total assist) Compensations: Slow rate;Small sips/bites (clear throat if voice gurgly, oral suction prn) Postural Changes and/or Swallow Maneuvers: Seated upright 90 degrees;Upright 30-60 min after meal              Oral Care Recommendations: Oral care before and after PO Follow up Recommendations: None Plan: Discharge SLP treatment due to (comment) (lack of progress and worsening swallow ability)   Donavan Burnet, MS [facility] SLP [phone]

## 2015-12-04 NOTE — Care Management Note (Signed)
Case Management Note  Patient Details  Name: [name] MRN: [id number] Date of Birth: 1932/01/07  Subjective/Objective:                    Action/Plan:   Expected Discharge Date:                  Expected Discharge Plan:  Home w Home Health Services  In-House Referral:     Discharge planning Services  CM Consult  Post Acute Care Choice:    Choice offered to:     DME Arranged:    DME Agency:     HH Arranged:    HH Agency:     Status of Service:  In process, will continue to follow  Medicare Important Message Given:  Yes Date Medicare IM Given:    Medicare IM give by:    Date Additional Medicare IM Given:    Additional Medicare Important Message give by:     If discussed at Long Length of Stay Meetings, dates discussed:  [id number] Additional Comments:  Golda Acre, RN 12/04/2015, 11:28 AM

## 2015-12-04 NOTE — Progress Notes (Signed)
Date: December 04, 2015 Chart reviewed for concurrent status and case management needs. Will continue to follow patient for changes and needs: Marcelle Smiling, RN, BSN, [place]   [phone]

## 2015-12-04 NOTE — Progress Notes (Signed)
Nutrition Follow-up  DOCUMENTATION CODES:   Not applicable  INTERVENTION:  - Will d/c Ensure and Prostat as pt has not been consuming them/refusing - Palliative note indicates they are not recommending tube feeding, recommend trial of appetite stimulant to determine if this has a positive effect for pt - RD will continue to monitor for needs  NUTRITION DIAGNOSIS:   Inadequate oral intake related to lethargy/confusion, poor appetite as evidenced by meal completion < 25%. -NEW and ongoing  GOAL:   Patient will meet greater than or equal to 90% of their needs -unmet  MONITOR:   PO intake, Weight trends, Labs, Skin, I & O's  REASON FOR ASSESSMENT:   Consult Assessment of nutrition requirement/status  ASSESSMENT:   82 y.o. female , recent hospitalization 11/13/15-11/19/15 with diagnosis of acute encephalopathy, sepsis secondary to UTI, was discharged to SNF, returns to the [facility] ED on 11/23/15 with complaints of worsening confusion/combativeness and decreased oral intake. She has PMH of ASCVD, CABG, AAA repair, rheumatoid arthritis, chronic low back pain, chronic diastolic CHF, chronic lower extremity venous stasis ulcers, HTN. Patient is confused and not a reliable historian. As per ED staff, patient was spitting at them. As per report, she has been refusing to take her medications, increasingly combative towards staff which has limited her care at the SNF.   1/10 Pt continues to eat 0%/refuse meals. Call received yesterday from ambassador (food service staff) stating that staff is having to throw all meals away each day as pt continues not to eat.   Pt's family met with Palliative care and note from this meeting, from 1/9 at 1645: Her family inquired about artificial nutrition and hydration and we discussed this at length. We talked about the risk versus burden of such an intervention in context of Ms. Leatherwood's medical state. I would not recommend a feeding tube as I do  not think this will contribute to reversal of the underlying disease process and has additional burden associated with it.  If pt not to become comfort measures only and tube feeding not to be provided, recommend trial of appetite stimulant. Will d/c supplements at this time as pt not consuming them but will re-order should intakes improve. Pt is not meeting needs. Medications reviewed. Labs reviewed; Ca: 8.6 mg/dL, Mg: 1.5 mg/dL.  Weight trends as follows:  11/14/15: 126 lbs 11/19/15: 115 lbs 11/26/15: 109 lbs 12/03/15: 114 lbs     1/5 - Pt continues to be agitated and refusing meals since last assessment.  - Continue to be unable to complete physical assessment.  - Lunch tray delivered at time of RD visit but pt uninterested.  - Pt may be appropriate for trial of appetite stimulant; will monitor for further decisions concerning nutrition/route of nutrition.  12/31 - Pt assessed during previous admission (12/26).  - Pt was diagnosed with moderate malnutrition. - Pt currently confused/agitated/combative.  - Per H&P, not a reliable historian and refusing medications.  - Pt with multiple wounds.  - RD to order supplements to provide extra protein.  - Pt has lost 19 lb since 12/05 (14% weight loss x 1 month, significant for time frame). - Unable to perform nutrition focused physical exam.  - Suspect malnutrition present given continued weight loss and mental status.    Diet Order:  Diet full liquid Room service appropriate?: Yes; Fluid consistency:: Thin  Skin:  Wound (see comment) (multiple leg wounds, stage II sacral and coccyx ulcers)  Last BM:  1/3  Height:  Ht Readings from Last 1 Encounters:  11/29/15 '5\' 2"'  (1.575 m)    Weight:   Wt Readings from Last 1 Encounters:  12/03/15 114 lb 10.2 oz (52 kg)    Ideal Body Weight:  50 kg  BMI:  Body mass index is 20.96 kg/(m^2).  Estimated Nutritional Needs:   Kcal:  1700-1900  Protein:  80-90g  Fluid:   1.9L/day  EDUCATION NEEDS:   No education needs identified at this time     Jarome Matin, RD, LDN Inpatient Clinical Dietitian Pager # [phone] After hours/weekend pager # [phone]

## 2015-12-04 NOTE — Consult Note (Signed)
Consultation Note Date: 12/04/2015   Patient Name: [name]  DOB: December 12, 1931  MRN: [id number]  Age / Sex: 82 y.o., female  PCP: Marletta Lor, MD Referring Physician: Robbie Lis, MD  Reason for Consultation: Establishing goals of care  Clinical Assessment/Narrative: 83 y.o. female with past medical history of ASCVD, CABG, AAA repair, rheumatoid arthritis, chronic low back pain, chronic diastolic CHF, chronic lower extremity venous stasis ulcers, HTN, recent hospitalization 11/13/15-11/19/15 for acute encephalopathy and sepsis secondary to UTI. Discharged to SNF and then returned to the [facility] ED on 11/23/15 with complaints of worsening confusion/combativeness and decreased oral intake. Currently admitted for acute encephalopathy and dehydration with hypernatremia. Hypernatremia and electrolyte abnormalities have resolved but pt continued to be confused and agitated.  Psychiatry was consulted and patient was started on IM Zyprexa. Recent extensive workup for altered mental status has been negative (negative RPR, TSH, B12). Neurology was consulted but patient unable to cooperate for MRI brain or EEG.  Palliative consulted for goals of care.  Contacts/Participants in Discussion: Patient's son and daughter, as well as her son-in-law Primary Decision Maker: Patient's children HCPOA: No  I met with [name] family.  They report that she has always been a very "put together" person who is very social and enjoys spending time with people.  Important things to her moving forward to be spending time with family and also getting well enough to return to her home.  They feel as though they do not have a good understanding exactly what is going on, however they attribute this to the unknown nature of her disease rather than not having physicians share information with them.  SUMMARY OF RECOMMENDATIONS - Her  family is struggling with the acute decline [name] has displayed since being at her baseline 3 weeks ago. - We discussed concern that the chance of this being a reversible problem diminishes as we move forward and her overall status remains largely the same while also being complicated by the fact that she continues to have compromised nutrition.   - Her family reports she is a very social person. I asked her children to consider what they believe their mother's wishes would be based upon the fact that this may very well be her new baseline and, per their report, she is no longer able to meaningfully interact in a way that she would find acceptable. - Her family inquired about artificial nutrition and hydration and we discussed this at length. We talked about the risk versus burden of such an intervention in context of [name]'s medical state. I would not recommend a feeding tube as I do not think this will contribute to reversal of the underlying disease process and has additional burden associated with it. - I gave her family copies of Hard Choices for Loving People to review. - It is very difficult for family to make decisions moving forward based upon the fact that they have not been given a clear diagnosis on what is going on with their mother. They report needing time to discuss as a family and also to review the information that we discussed today.  Code Status/Advance Care Planning: DNR    Code Status Orders        Start     Ordered   11/23/15 1318  Do not attempt resuscitation (DNR)   Continuous    Question Answer Comment  In the event of cardiac or respiratory ARREST Do not call a "code blue"  In the event of cardiac or respiratory ARREST Do not perform Intubation, CPR, defibrillation or ACLS   In the event of cardiac or respiratory ARREST Use medication by any route, position, wound care, and other measures to relive pain and suffering. May use oxygen, suction and manual treatment  of airway obstruction as needed for comfort.      11/23/15 1317    Advance Directive Documentation        Most Recent Value   Type of Advance Directive  Out of facility DNR (pink MOST or yellow form), Living will   Pre-existing out of facility DNR order (yellow form or pink MOST form)  Yellow form placed in chart (order not valid for inpatient use)   "MOST" Form in Place?      Symptom Management:   Psychosis/delirium: Her family believes this may be slowly resolving and would like to have more time to see if she continues to improve. I would recommend continuing Zyprexa. Additionally, recommendations for delirium management as below.  Palliative Prophylaxis:   Bowel Regimen, Delirium Protocol and Frequent Pain Assessment  Additional Recommendations (Limitations, Scope, Preferences): Standard delirium management (adapted from NICE guidelines 2011 for prevention of delirium):  Provide continuity of care when possible (avoid frequent changing of surroundings and staff).  Frequent reorientation to time with:  A clock should always be visible.  Make sure Calendar/white board is updated.  Lights on/blinds open during the day and off/closed at night.  Encourage frequent family visits.  Monitor for and treat dehydration/constipation.  Optimize oxygen saturation.  Avoid catheters and IV's when possible and look for/treat infections.  Encourage early mobility.  Assess and treat pain.  Ensure adequate nutrition and functioning dentures.  Address reversible causes of hearing and visual impairment:  Use pocket talker if hearing aids are unavailable.  Avoid sleep disturbance (normalize sleep/wake cycle).  Minimize disturbances and consider NOT obtaining vitals at night if possible.  Review Medications to avoid polypharmacy and avoid deliriogenic medications when possible:  Benzodiazepines.  Dihydropyridines.  Antihistamines.  Anticholinergics.  (Possibly avoid: H2 blockers, tricyclic  antidepressants, antiparkinson medications, steroids, NSAID's).   Psycho-social/Spiritual:  Support System: [place] Desire for further Chaplaincy support:No  Prognosis: Unable to determine, but I would suspect weeks at best.  I believe that nutrition may be a life limiting factor moving forward.  We did discuss artificial nutrition and hydration at the request of family. I advised them that I did not feel this was likely to add quality time to Ms. Preslar's life as it would not fix whatever the underlying problem and is not without risk or burden. I provided with them with a copy of Hard Choices for Loving People to review.  Discharge Planning: To be determined   Chief Complaint/ Primary Diagnoses: Present on Admission:  . (Resolved) Dehydration . Dehydration with hypernatremia . Acute encephalopathy . Chronic diastolic congestive heart failure ([place]) . CORONARY ATHEROSCLEROSIS, ARTERY BYPASS GRAFT . Essential hypertension . Malnutrition of moderate degree . Pressure ulcer . Rheumatoid arthritis ([place]) . Hypernatremia  I have reviewed the medical record, interviewed the patient and family, and examined the patient. The following aspects are pertinent.  Past Medical History  Diagnosis Date  . DEPRESSION 05/21/2007  . FIBROCYSTIC BREAST DISEASE, HX OF 05/21/2007  . HYPERLIPIDEMIA-MIXED 11/26/2008    takes Simvastatin daily  . Irritable bowel syndrome 05/21/2007  . KNEE PAIN 09/16/2007  . LEG EDEMA, BILATERAL 05/21/2009  . OSTEOPENIA 05/21/2007  . URTICARIA, CHRONIC 05/21/2007  . History of aortic aneurysm  resection and grafting of a 5.2-cm ascending aortic arch aneurysm August 2009  . Dyslipidemia   . Vitamin D deficiency   . Urticaria   . Neuropathy (HCC)     left radial  neuropathy with wrist drop  . Diverticulosis   . Irritable bowel syndrome   . GERD (gastroesophageal reflux disease)   . 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 Vaginal yeast infection     recently  told to use monistat, hasn't started as of 01/26/2012  . HYPERTENSION 05/11/2008    takes Metoprolol and HCTZ daily  . Asthma     hx of;as a child  . Seasonal allergies     takes Hydroxyzine prn and Zyrtec 2 times a week  . Peripheral edema     takes Furosemide prn  . Pneumonia     history of;in the 90's  . Weakness     hands and feet  . Sciatica   . OSTEOARTHRITIS 01/20/2008    takes Methotrexate weekly  . DJD (degenerative joint disease)     history of   . Rheumatoid arthritis(714.0)   . Chronic back pain     takes Norco prn  . Bruises easily     takes Plaquenil daily  . CORONARY ATHEROSCLEROSIS, ARTERY BYPASS GRAFT 08/23/2008  . CAD (coronary artery disease)     s/p CABG -  x 1 with LIMA to LAD August 2009 /   . Urinary incontinence   . Bowel incontinence   . History of colon polyps   . Urinary frequency   . Urinary urgency   . Nocturia   . Anemia     takes folic acid bid  . Urinary incontinence     wears depends  . Hereditary and idiopathic peripheral neuropathy 05/17/2015   Social History   Social History  . Marital Status: Widowed    Spouse Name: N/A  . Number of Children: 2  . Years of Education: N/A   Occupational History  . retired    Social History Main Topics  . Smoking status: Never Smoker   . Smokeless tobacco: Never Used  . Alcohol Use: No  . Drug Use: No  . Sexual Activity: No   Other Topics Concern  . None   Social History Narrative   Patient is right handed.   Patient drinks a little caffeine daily.   Family History  Problem Relation Age of Onset  . Heart disease    . Coronary artery disease    . Anesthesia problems Neg Hx   . Hypotension Neg Hx   . Malignant hyperthermia Neg Hx   . Pseudochol deficiency Neg Hx   . Heart disease Mother   . Stroke Father   . Cancer Maternal Grandmother   . Stroke Paternal Grandmother    Scheduled Meds: . aspirin EC  81 mg Oral Daily  . cholecalciferol  1,000 Units Oral Daily  . enoxaparin  (LOVENOX) injection  30 mg Subcutaneous Q24H  . feeding supplement (ENSURE ENLIVE)  237 mL Oral Q24H  . feeding supplement (PRO-STAT SUGAR FREE 64)  30 mL Oral Daily  . folic acid  1 mg Oral BID  . hydroxychloroquine  200 mg Oral BID  . LORazepam  1 mg Intravenous QHS  . methotrexate  15 mg Oral Weekly  . metoprolol  5 mg Intravenous 4 times per day  . multivitamin  5 mL Oral Daily  . OLANZapine  2.5 mg Intramuscular BID  .  senna-docusate  1 tablet Oral BID  . simvastatin  40 mg Oral Daily   Continuous Infusions:  PRN Meds:.acetaminophen **OR** acetaminophen, albuterol, LORazepam Medications Prior to Admission:  Prior to Admission medications   Medication Sig Start Date End Date Taking? Authorizing Provider  acetaminophen (TYLENOL) 500 MG tablet Take 500 mg by mouth every 6 (six) hours as needed (for pain.).   Yes Historical Provider, MD  aspirin EC 81 MG tablet Take 81 mg by mouth at bedtime.    Yes Historical Provider, MD  cetirizine (ZYRTEC) 10 MG tablet Take 10 mg by mouth daily as needed for allergies.    Yes Historical Provider, MD  cholecalciferol (VITAMIN D) 1000 UNITS tablet Take 1,000 Units by mouth daily.   Yes Historical Provider, MD  ciprofloxacin (CIPRO) 500 MG tablet Take 1 tablet (500 mg total) by mouth 2 (two) times daily. 11/19/15  Yes Dorothea Ogle, MD  DENTA 5000 PLUS 1.1 % CREA dental cream BRUSH 3 TIMES A DAY 10/01/15  Yes Historical Provider, MD  fluconazole (DIFLUCAN) 100 MG tablet Take 100 mg by mouth daily. 11/21/15  Yes Historical Provider, MD  folic acid (FOLVITE) 1 MG tablet Take 1 mg by mouth 2 (two) times daily.   Yes Historical Provider, MD  HYDROcodone-acetaminophen (NORCO/VICODIN) 5-325 MG tablet Take 1 tablet by mouth every 6 (six) hours as needed for moderate pain. 11/19/15  Yes Dorothea Ogle, MD  hydroxychloroquine (PLAQUENIL) 200 MG tablet Take 200 mg by mouth 2 (two) times daily.    Yes Historical Provider, MD  LORazepam (ATIVAN IJ) Inject 0.5 mg as  directed every 6 (six) hours as needed (agitation).   Yes Historical Provider, MD  methotrexate (RHEUMATREX) 2.5 MG tablet Take 15 mg by mouth once a week. Caution:Chemotherapy. Protect from light. Take on Thursday.   Yes Historical Provider, MD  metoprolol succinate (TOPROL-XL) 50 MG 24 hr tablet TAKE 1 TABLET BY MOUTH EVERY DAY 10/16/15  Yes Gordy Savers, MD  NASONEX 50 MCG/ACT nasal spray Place 2 sprays into the nose daily as needed (allergies).  06/22/15  Yes Historical Provider, MD  Pediatric Multivit-Minerals-C (COMPLETE MULTI-VITAMIN PO) Take 1 tablet by mouth daily.   Yes Historical Provider, MD  potassium chloride (KLOR-CON) 8 MEQ tablet Take 8 mEq by mouth 3 (three) times daily.   Yes Historical Provider, MD  senna-docusate (SENOKOT-S) 8.6-50 MG tablet Take 1 tablet by mouth 2 (two) times daily. 11/19/15  Yes Dorothea Ogle, MD  simvastatin (ZOCOR) 40 MG tablet TAKE 1 TABLET BY MOUTH AT BEDTIME 02/22/15  Yes Gordy Savers, MD  furosemide (LASIX) 40 MG tablet Take 0.5 tablets (20 mg total) by mouth 2 (two) times daily. 11/19/15   Dorothea Ogle, MD  hydrochlorothiazide (HYDRODIURIL) 25 MG tablet TAKE 1 TABLET BY MOUTH EVERY MORNING 06/18/15   Gordy Savers, MD   Allergies  Allergen Reactions  . Amoxicillin     REACTION: unspecified  . Cyclobenzaprine Hcl     REACTION: rash  . Mupirocin     Unknown reaction  . Penicillins Hives  . Tetanus Toxoid Hives    Review of Systems  Unable to perform ROS   Physical Exam   General: Pt is disoriented, incoherently mumbling, not in acute distress  Cardiovascular: Regular rate and rhythm, S1/S2 (+)  Respiratory: Clear to auscultation bilaterally, no wheezing, no crackles, no rhonchi  Abdomen: Soft, non tender, non distended, bowel sounds present  Extremities: No edema  Neuro: nonfocal  Vital Signs: BP  120/46 mmHg  Pulse 100  Temp(Src) 98.6 F (37 C) (Axillary)  Resp 20  Ht '5\' 2"'$  (1.575 m)  Wt 52 kg (114 lb 10.2  oz)  BMI 20.96 kg/m2  SpO2 96%  SpO2: SpO2: 96 % O2 Device:SpO2: 96 % O2 Flow Rate: .O2 Flow Rate (L/min): 4 L/min  IO: Intake/output summary:  Intake/Output Summary (Last 24 hours) at 12/04/15 0819 Last data filed at 12/03/15 1300  Gross per 24 hour  Intake      0 ml  Output      0 ml  Net      0 ml    LBM: Last BM Date: 11/27/15 Baseline Weight: Weight: 49.1 kg (108 lb 3.9 oz) Most recent weight: Weight: 52 kg (114 lb 10.2 oz)      Palliative Assessment/Data:  Flowsheet Rows        Most Recent Value   Intake Tab    Referral Department  Hospitalist   Unit at Time of Referral  Med/Surg Unit   Palliative Care Primary Diagnosis  Other (Comment)   Date Notified  11/30/15   Palliative Care Type  New Palliative care   Reason for referral  Clarify Goals of Care, Non-pain Symptom   Date of Admission  11/23/15   Date first seen by Palliative Care  12/03/15   # of days Palliative referral response time  3 Day(s)   # of days IP prior to Palliative referral  7   Clinical Assessment    Palliative Performance Scale Score  30%   Pain Max last 24 hours  Not able to report   Pain Min Last 24 hours  Not able to report   Dyspnea Max Last 24 Hours  Not able to report   Dyspnea Min Last 24 hours  Not able to report   Psychosocial & Spiritual Assessment    Palliative Care Outcomes    Patient/Family meeting held?  Yes   Who was at the meeting?  Patient's son and daughter   Palliative Care Outcomes  Provided psychosocial or spiritual support      Additional Data Reviewed:  CBC:    Component Value Date/Time   WBC 4.4 11/28/2015 2007   HGB 11.3* 11/28/2015 2007   HCT 35.8* 11/28/2015 2007   HCT 29.2* 11/13/2015 2225   PLT 296 11/28/2015 2007   MCV 87.1 11/28/2015 2007   NEUTROABS 13.5* 11/23/2015 0750   LYMPHSABS 1.8 11/23/2015 0750   MONOABS 0.8 11/23/2015 0750   EOSABS 0.2 11/23/2015 0750   BASOSABS 0.0 11/23/2015 0750   Comprehensive Metabolic Panel:    Component Value  Date/Time   NA 141 12/03/2015 0540   K 3.7 12/03/2015 0540   CL 104 12/03/2015 0540   CO2 30 12/03/2015 0540   BUN 6 12/03/2015 0540   CREATININE 0.49 12/03/2015 0540   CREATININE 0.54* 09/14/2015 1139   GLUCOSE 126* 12/03/2015 0540   CALCIUM 8.6* 12/03/2015 0540   AST 33 11/23/2015 0750   ALT 22 11/23/2015 0750   ALKPHOS 94 11/23/2015 0750   BILITOT 1.0 11/23/2015 0750   PROT 8.0 11/23/2015 0750   PROT 6.3 05/17/2015 1705   ALBUMIN 3.9 11/23/2015 0750     Time In: 1530 Time Out: 1645 Time Total: 75 Greater than 50%  of this time was spent counseling and coordinating care related to the above assessment and plan.  Signed by: Micheline Rough, MD  Micheline Rough, MD  12/04/2015, 8:19 AM  Please contact Palliative Medicine Team phone at  [phone] for questions and concerns.

## 2015-12-04 NOTE — Care Management Important Message (Signed)
Important Message  Patient Details  Name: [name] MRN: [id number] Date of Birth: [birth year]   Medicare Important Message Given:  Yes    Haskell Flirt 12/04/2015, 11:35 AMImportant Message  Patient Details  Name: [name] MRN: [id number] Date of Birth: Oct 27, 1932   Medicare Important Message Given:  Yes    Haskell Flirt 12/04/2015, 11:35 AM

## 2015-12-04 NOTE — Consult Note (Signed)
[facility] Face-to-Face Psychiatry Consult follow-up  Reason for Consult:  Confused, combative with staff and AMS Referring Physician:  Dr. Algis Liming Patient Identification: [name] MRN:  [id number] Principal Diagnosis: Acute encephalopathy Diagnosis:   Patient Active Problem List   Diagnosis Date Noted  . Hypokalemia [E87.6] 12/04/2015  . Dyslipidemia [E78.5] 12/04/2015  . Acute respiratory failure with hypoxia ([place]) [J96.01] 12/04/2015  . Paranoid psychosis ([place]) [F22] 11/28/2015  . Dehydration with hypernatremia [E87.0] 11/23/2015  . Malnutrition of moderate degree [E44.0] 11/14/2015  . Acute encephalopathy [G93.40] 11/14/2015  . Rheumatoid arthritis ([place]) [M06.9] 11/13/2015  . CORONARY ATHEROSCLEROSIS, ARTERY BYPASS GRAFT [I25.810] 08/23/2008  . Essential hypertension [I10] 05/11/2008    Total Time spent with patient: 30 minutes  Subjective:   [name] is a 82 y.o. female patient admitted with AMS.  HPI:  [name] is a 82 y.o. female, recent resident of SNF admitted to [facility] for increased symptoms of worsening confusion, emotion instability and combativeness with staff. patient has been frustrated, upset because no one believe her. She is poor historian and has no family at bed side. She has been suffering with loss of singificant cognitions. She does not know why she was in hospital and her current medical or emotional problems. She knows her name, age and being hospitalized. She complaints about not able to eat, drink and sleep better. She denied suicide or homicide ideation, intention or plans. Will ask unit social service to contact SNF and family contact regarding her baseline functioning. She has a walker in her room and has multiple laceration on her legs and hands.   Medical history: She was recent hospitalization 11/13/15-11/19/15 with diagnosis of acute encephalopathy, sepsis secondary to UTI, was discharged to SNF, returns to the [facility] ED on  11/23/15 with complaints of worsening confusion/combativeness and decreased oral intake. She has PMH of ASCVD, CABG, AAA repair, rheumatoid arthritis, chronic low back pain, chronic diastolic CHF, chronic lower extremity venous stasis ulcers, HTN. Patient is confused and not a reliable historian. As per ED staff, patient was spitting at them. As per report, she has been refusing to take her medications, increasingly combative towards staff which has limited her care at the SNF. SNF thereby sent her to the hospital for IV therapy. In the ED, lab work shows sodium 155, potassium 3.3, chloride 99, bicarbonate 38, BUN 47, creatinine 1, WBC 16.3, chest x-ray without acute abnormality and urine microscopy not indicative of UTI. Hospitalist admission was requested.  12/04/2015 Interval history: Patient seen for psychiatric consultation follow-up today. Patient appeared sitting in a bed with the head end was elevated. Patient seems to be awake but not alert and continued to be confused. Patient making sounds but not audible either words or sentences during my visit. Patient does not appear to be irritable or agitated but continued to be confused and unable to care for herself. She has new onset of confusion, irritability, behavioral problems like splitting on staff, paranoid psychosis and altered mental status. Will continue her current medication Zyprexa 2.5 mg IM twice daily as she is able to tolerate with out EPS and currently resting without distress. Continue Lorazepam to 1 mg at bedtime for better control of insomnia and agitation. Please continue investigation for the altered mental status as patient has much better baseline functioning as per the family.   Past Psychiatric History: None reported  Risk to Self: Is patient at risk for suicide?: No Risk to Others:   Prior Inpatient Therapy:  Prior Outpatient Therapy:    Past Medical History:  History reviewed. No pertinent past medical history.  Past  Surgical History  Procedure Laterality Date  . Appendectomy    . Cesarean section  1968    x 1  . Hernia repair    . Coronary artery bypass graft  August 2009    CABG x 1 with LIMA to LAD /  . Ascending aortic aneurysm repair  August 2009     resection and grafting of a 5.2-cm ascending aortic arch aneurysm  . Joint replacement      2009 & 2010- both knees  . Back surgery      2011- cerv. fusion   . Breast surgery      biopsy x2- R breast  . Tonsillectomy      as a child  . Cardiac catheterization  2009  . Colonoscopy    . Lumbar laminectomy/decompression microdiscectomy Bilateral 07/11/2013    Procedure: LUMBAR LAMINECTOMY/DECOMPRESSION MICRODISCECTOMY LUMBAR FIVE SACRAL ONE;  Surgeon: Elaina Hoops, MD;  Location: [facility];  Service: Neurosurgery;  Laterality: Bilateral;   Family History:  Family History  Problem Relation Age of Onset  . Heart disease    . Coronary artery disease    . Anesthesia problems Neg Hx   . Hypotension Neg Hx   . Malignant hyperthermia Neg Hx   . Pseudochol deficiency Neg Hx   . Heart disease Mother   . Stroke Father   . Cancer Maternal Grandmother   . Stroke Paternal Grandmother    Family Psychiatric  History: unknown Social History:  History  Alcohol Use No     History  Drug Use No    Social History   Social History  . Marital Status: Widowed    Spouse Name: N/A  . Number of Children: 2  . Years of Education: N/A   Occupational History  . retired    Social History Main Topics  . Smoking status: Never Smoker   . Smokeless tobacco: Never Used  . Alcohol Use: No  . Drug Use: No  . Sexual Activity: No   Other Topics Concern  . None   Social History Narrative   Patient is right handed.   Patient drinks a little caffeine daily.   Additional Social History:                          Allergies:   Allergies  Allergen Reactions  . Amoxicillin     REACTION: unspecified  . Cyclobenzaprine Hcl     REACTION: rash   . Mupirocin     Unknown reaction  . Penicillins Hives  . Tetanus Toxoid Hives    Labs:  Results for orders placed or performed during the hospital encounter of 11/23/15 (from the past 48 hour(s))  Basic metabolic panel     Status: Abnormal   Collection Time: 12/03/15  5:40 AM  Result Value Ref Range   Sodium 141 135 - 145 mmol/L   Potassium 3.7 3.5 - 5.1 mmol/L   Chloride 104 101 - 111 mmol/L   CO2 30 22 - 32 mmol/L   Glucose, Bld 126 (H) 65 - 99 mg/dL   BUN 6 6 - 20 mg/dL   Creatinine, Ser 0.49 0.44 - 1.00 mg/dL   Calcium 8.6 (L) 8.9 - 10.3 mg/dL   GFR calc non Af Amer >60 >60 mL/min   GFR calc Af Amer >60 >60 mL/min    Comment: (  NOTE) The eGFR has been calculated using the CKD EPI equation. This calculation has not been validated in all clinical situations. eGFR's persistently <60 mL/min signify possible Chronic Kidney Disease.    Anion gap 7 5 - 15  Magnesium     Status: Abnormal   Collection Time: 12/03/15  5:40 AM  Result Value Ref Range   Magnesium 1.5 (L) 1.7 - 2.4 mg/dL    Current Facility-Administered Medications  Medication Dose Route Frequency Provider Last Rate Last Dose  . acetaminophen (TYLENOL) tablet 650 mg  650 mg Oral Q6H PRN Modena Jansky, MD       Or  . acetaminophen (TYLENOL) suppository 650 mg  650 mg Rectal Q6H PRN Modena Jansky, MD      . albuterol (PROVENTIL) (2.5 MG/3ML) 0.083% nebulizer solution 2.5 mg  2.5 mg Nebulization Q2H PRN Modena Jansky, MD      . aspirin EC tablet 81 mg  81 mg Oral Daily Modena Jansky, MD   81 mg at 11/23/15 1619  . cholecalciferol (VITAMIN D) tablet 1,000 Units  1,000 Units Oral Daily Modena Jansky, MD   1,000 Units at 11/23/15 1619  . enoxaparin (LOVENOX) injection 30 mg  30 mg Subcutaneous Q24H Modena Jansky, MD   30 mg at 12/03/15 1806  . folic acid (FOLVITE) tablet 1 mg  1 mg Oral BID Modena Jansky, MD   1 mg at 11/29/15 2314  . hydroxychloroquine (PLAQUENIL) tablet 200 mg  200 mg Oral BID  Modena Jansky, MD   200 mg at 11/29/15 2314  . LORazepam (ATIVAN) injection 1 mg  1 mg Intravenous QHS Gardiner Barefoot, NP   1 mg at 12/02/15 2241  . LORazepam (ATIVAN) injection 1 mg  1 mg Intravenous Q6H PRN Gardiner Barefoot, NP   1 mg at 12/04/15 1151  . methotrexate (RHEUMATREX) tablet 15 mg  15 mg Oral Weekly Modena Jansky, MD   15 mg at 11/29/15 1000  . metoprolol (LOPRESSOR) injection 5 mg  5 mg Intravenous 4 times per day Barton Dubois, MD   5 mg at 12/04/15 1151  . multivitamin liquid 5 mL  5 mL Oral Daily Clayton Bibles, RD   5 mL at 11/24/15 1509  . OLANZapine (ZYPREXA) injection 2.5 mg  2.5 mg Intramuscular BID Ambrose Finland, MD   2.5 mg at 12/03/15 2100  . senna-docusate (Senokot-S) tablet 1 tablet  1 tablet Oral BID Modena Jansky, MD   1 tablet at 11/29/15 2344  . simvastatin (ZOCOR) tablet 40 mg  40 mg Oral Daily Modena Jansky, MD   40 mg at 11/23/15 1619    Musculoskeletal: Strength & Muscle Tone: decreased Gait & Station: unable to stand Patient leans: N/A  Psychiatric Specialty Exam: ROS   Blood pressure 120/46, pulse 100, temperature 98.6 F (37 C), temperature source Axillary, resp. rate 20, height '5\' 2"'  (1.575 m), weight 52 kg (114 lb 10.2 oz), SpO2 96 %.Body mass index is 20.96 kg/(m^2).  General Appearance: Bizarre and Guarded  Eye Contact::  Poor  Speech:  Clear and Coherent and Slow  Volume:  Decreased  Mood:  Anxious, Depressed and Irritable  Affect:  Depressed and Tearful  Thought Process:  Disorganized and Irrelevant  Orientation:  Other:  to her name, friend name, age and being hospitalized only.   Thought Content:  Rumination and agitation  Suicidal Thoughts:  No  Homicidal Thoughts:  No  Memory:  Immediate;  Poor Recent;   Poor  Judgement:  Impaired  Insight:  Shallow  Psychomotor Activity:  Decreased  Concentration:  Fair  Recall:  [employer] of Knowledge:Fair  Language: Fair  Akathisia:  Negative  Handed:  Right   AIMS (if indicated):     Assets:  Communication Skills Desire for Improvement Financial Resources/Insurance Leisure Time Resilience Social Support  ADL's:  Impaired  Cognition: Impaired,  Mild  Sleep:      Treatment Plan Summary: Daily contact with patient to assess and evaluate symptoms and progress in treatment and Medication management   Discontinue Risperidone M- tab 0.5 mg and remeron sol tab as patient refusing oral medications Continue Zyprexa 2.5 mg IM twice daily for paranoid psychosis Continue Lorazepam 1 mg PO Qhs for insomnia, agitation and restlessness Appreciate psychiatric consultation and follow up as clinically required Please contact [phone] or [phone] if needs further assistance    Disposition: Patient does not meet criteria for psychiatric inpatient admission. Supportive therapy provided about ongoing stressors.  Patient will be referred to SNF when medically stable  Dominick Morella,JANARDHAHA R. 12/04/2015 1:27 PM

## 2015-12-04 NOTE — Progress Notes (Addendum)
Patient ID: [name], female   DOB: 01-30-1932, 82 y.o.   MRN: [id number] [facility] PROGRESS NOTE  [name] [id number] DOB: 27-Apr-1932 DOA: 11/23/2015 PCP: Rogelia Boga, MD  Brief narrative:    82 y.o. female with past medical history of ASCVD, CABG, AAA repair, rheumatoid arthritis, chronic low back pain, chronic diastolic CHF, chronic lower extremity venous stasis ulcers, HTN, recent hospitalization 11/13/15-11/19/15 for acute encephalopathy, sepsis secondary to UTI, she was discharged to SNF and then returned to the [facility] ED on 11/23/15 with complaints of worsening confusion/combativeness and decreased oral intake. She was admitted for acute encephalopathy and dehydration with hypernatremia. Hypernatremia and electrolyte abnormalities have resolved but pt continued to be confused and agitated.  Psychiatry was consulted and patient was started on IM Zyprexa (refuses to consistently take oral medications). Recent extensive workup for altered mental status has been negative (negative RPR, TSH, B12). Neurology was consulted but patient unable to cooperate for MRI brain or EEG.  Palliative consulted  for goals of care.  Barrier to discharge: Awaiting bed placement to SNF. She is from [facility] but no beds available there.   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  - Electrolytes corrected but pt continues to have altered mental status - Haldol not effective so now on IM Zyprexa per psych recommendations - TSH, B12, RPR recently unremarkable. Neurology was consulted but patient could not cooperate for MRI brain or EEG. - Palliative care also on board. - SW assisting discharge planning   Active Problems: Recurrent hypernatremia - Dehydration likely contributing to hypernatremia - Improved with  fluids   Hypokalemia - Supplemented and WNL  Dysphagia - Currently on full liquid diet.  - Continue aspiration precautions.   Dyslipidemia - Continue simvastatin   Acute respiratory failure with hypoxia - Chest x-ray without acute abnormalities.  - Continue oxygen support via [place] to keep O2 sats above 90%  Essential hypertension - Continue metoprolol 5 mg IV Q 6 hours   CAD status post CABG - Continue aspirin  - No reports of chest pain   Rheumatoid arthritis - Continue Plaquenil and Methotrexate  Chronic diastolic CHF - Compensated.  Chronic bilateral lower extremity ulcers secondary to venous stasis / stage II sacral decubitus ulcer - Evaluated by wound care team   Malnutrition of moderate degree - In the context of chronic illness  - Diet as tolerated with aspiration precautions   DVT Prophylaxis  - Lovenox subQ   Code Status: DNR/DNI  Family Communication:  Family not at the bedside  Disposition Plan: SW assisting discharge planning   IV access:  Peripheral IV  Procedures and diagnostic studies:     Dg Chest Port 1 View 11/28/2015  1. No acute cardiopulmonary findings. 2. Hyperinflated lungs with chronic bronchitic markings. 3. Posterior RIGHT rib fractures with nonunion. No change from CT 11/17/2015.  Dg Chest Portable 1 View 11/23/2015  No acute cardiopulmonary abnormality seen. Electronically Signed   By: Lupita Raider, M.D.   On: 11/23/2015 09:26   Mr Attempted [place] Corporal Report 11/27/2015  This examination belongs to an outside facility and is stored here for comparison purposes only.  Contact the originating outside institution for any associated report or interpretation.  Medical Consultants:  Psychiatry Neurology Palliative care team  IAnti-Infectives:   None    Manson Passey, MD  [facility] Pager [phone]  Time spent in minutes: 25  minutes  If 7PM-7AM, please contact night-coverage [email or web address] Password [email or web address] 12/04/2015, 1:06  PM   LOS: 10 days    HPI/Subjective: No acute overnight events. Not agitated at present.   Objective: Filed Vitals:   12/03/15 1256 12/03/15 1425 12/04/15 0225 12/04/15 0507  BP: 127/39 131/51 116/38 120/46  Pulse: 85  101 100  Temp: 97.6 F (36.4 C)  98.4 F (36.9 C) 98.6 F (37 C)  TempSrc: Axillary  Axillary Axillary  Resp: 22  20 20   Height:      Weight:      SpO2: 86%  92% 96%   No intake or output data in the 24 hours ending 12/04/15 1306  Exam:   General:  Pt is incoherently mumbling, not in acute distress  Cardiovascular: Rate controlled, appreciate S1, S2   Respiratory: No wheezing, no crackles, no rhonchi  Abdomen: (+) BS, non tender   Extremities: No swelling, palpable pulses   Neuro: Nonfocal  Data Reviewed: Basic Metabolic Panel:  Recent Labs Lab 11/28/15 0740 11/30/15 0515 12/02/15 0513 12/03/15 0540  NA 140  --  146* 141  K 5.0  --  3.4* 3.7  CL 102  --  108 104  CO2 31  --  26 30  GLUCOSE 99  --  77 126*  BUN 9  --  9 6  CREATININE 0.51 0.69 0.58 0.49  CALCIUM 9.5  --  8.6* 8.6*  MG  --   --   --  1.5*   Liver Function Tests: No results for input(s): AST, ALT, ALKPHOS, BILITOT, PROT, ALBUMIN in the last 168 hours. No results for input(s): LIPASE, AMYLASE in the last 168 hours. No results for input(s): AMMONIA in the last 168 hours. CBC:  Recent Labs Lab 11/28/15 2007  WBC 4.4  HGB 11.3*  HCT 35.8*  MCV 87.1  PLT 296   Cardiac Enzymes: No results for input(s): CKTOTAL, CKMB, CKMBINDEX, TROPONINI in the last 168 hours. BNP: Invalid input(s): POCBNP CBG: No results for input(s): GLUCAP in the last 168 hours.  No results found for this or any previous visit (from the past 240 hour(s)).   Scheduled Meds: . aspirin EC  81 mg Oral Daily  . cholecalciferol  1,000 Units Oral Daily  . enoxaparin (LOVENOX) injection  30 mg Subcutaneous Q24H  . folic acid  1 mg Oral BID  . hydroxychloroquine  200 mg Oral BID  . LORazepam  1  mg Intravenous QHS  . methotrexate  15 mg Oral Weekly  . metoprolol  5 mg Intravenous 4 times per day  . multivitamin  5 mL Oral Daily  . OLANZapine  2.5 mg Intramuscular BID  . senna-docusate  1 tablet Oral BID  . simvastatin  40 mg Oral Daily

## 2015-12-05 DIAGNOSIS — E860 Dehydration: Principal | ICD-10-CM

## 2015-12-05 MED ORDER — FLUCONAZOLE IN SODIUM CHLORIDE 200-0.9 MG/100ML-% IV SOLN
200.0000 mg | Freq: Once | INTRAVENOUS | Status: AC
Start: 2015-12-05 — End: 2015-12-05
  Administered 2015-12-05: 200 mg via INTRAVENOUS
  Filled 2015-12-05: qty 100

## 2015-12-05 NOTE — Plan of Care (Signed)
Problem: Education: Goal: Knowledge of [facility] Education information/materials will improve Outcome: Completed/Met Date Met:  12/05/15 Educated family

## 2015-12-05 NOTE — Progress Notes (Signed)
Patient ID: [name], female   DOB: Mar 14, 1932, 82 y.o.   MRN: [id number] [facility] PROGRESS NOTE  [name] [id number] DOB: 1932-08-27 DOA: 11/23/2015 PCP: Colleen Boga, MD  Brief narrative:    82 y.o. female with past medical history of ASCVD, CABG, AAA repair, rheumatoid arthritis, chronic low back pain, chronic diastolic CHF, chronic lower extremity venous stasis ulcers, HTN, recent hospitalization 11/13/15-11/19/15 for acute encephalopathy, sepsis secondary to UTI, she was discharged to SNF and then returned to the [facility] ED on 11/23/15 with complaints of worsening confusion/combativeness and decreased oral intake. She was admitted for acute encephalopathy and dehydration with hypernatremia. Hypernatremia and electrolyte abnormalities have resolved but pt continued to be confused and agitated.  Psychiatry was consulted and patient was started on IM Zyprexa (refuses to consistently take oral medications). Recent extensive workup for altered mental status has been negative (negative RPR, TSH, B12). Neurology was consulted but patient unable to cooperate for MRI brain or EEG.  Palliative consulted  for goals of care.  Barrier to discharge: Awaiting bed placement to SNF. She is from [facility] but no beds available there.   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  - Electrolytes corrected but pt continues to have altered mental status - Haldol not effective so now on IM Zyprexa per psych recommendations - TSH, B12, RPR recently unremarkable. Neurology was consulted but patient could not cooperate for MRI brain or EEG. - Palliative care also on board. - SW assisting discharge planning   Active Problems: Recurrent hypernatremia - Dehydration likely contributing to hypernatremia - Improved with  fluids   Hypokalemia - Supplemented and WNL  Dysphagia - Currently on full liquid diet.  - Continue aspiration precautions.   Dyslipidemia - Continue simvastatin   Acute respiratory failure with hypoxia - Chest x-ray without acute abnormalities.  - Continue oxygen support via [place] to keep O2 sats above 90%  Essential hypertension - Continue metoprolol 5 mg IV Q 6 hours   CAD status post CABG - Continue aspirin  - No reports of chest pain   Rheumatoid arthritis - Continue Plaquenil and Methotrexate  Chronic diastolic CHF - Compensated.  Chronic bilateral lower extremity ulcers secondary to venous stasis / stage II sacral decubitus ulcer - Evaluated by wound care team   Malnutrition of moderate degree - In the context of chronic illness  - Diet as tolerated with aspiration precautions   Yeast in urine, in the setting of confusion, per chart review patient was prescribed po diflucan but refused oral meds, will treat with iv diflucan  FTT, per family patient has 30-50pounds weight loss in the last 65months.  DVT Prophylaxis  - Lovenox subQ   Code Status: DNR/DNI  Family Communication:  Daughter [name] in room and son [name] on the phone Disposition Plan: SW assisting discharge planning   IV access:  Peripheral IV  Procedures and diagnostic studies:     Dg Chest Port 1 View 11/28/2015  1. No acute cardiopulmonary findings. 2. Hyperinflated lungs with chronic bronchitic markings. 3. Posterior RIGHT rib fractures with nonunion. No change from CT 11/17/2015.  Dg Chest Portable 1 View 11/23/2015  No acute cardiopulmonary abnormality seen. Electronically Signed   By: [name], M.D.   On: 11/23/2015 09:26   Mr Attempted [place] Corporal Report 11/27/2015  This examination belongs to an outside facility and is stored here for comparison purposes only.  Contact the originating outside institution for any associated report or interpretation.  Medical Consultants:   Psychiatry Neurology Palliative care team  IAnti-Infectives:   None    Colleen Spitzley, MD PhD [facility] Pager [phone]  Time spent in minutes: 35 minutes  If 7PM-7AM, please contact night-coverage [email or web address] Password [email or web address] 12/05/2015, 4:14 PM   LOS: 11 days    HPI/Subjective: Hallucinating, talking incoherently, daughter in room  Objective: Filed Vitals:   12/04/15 2133 12/04/15 2139 12/05/15 0534 12/05/15 1240  BP:   107/41 131/38  Pulse: 116 130 96 91  Temp:  99.1 F (37.3 C) 98.5 F (36.9 C)   TempSrc:  Axillary Axillary   Resp:  40 24   Height:      Weight:      SpO2:  99% 96%     Intake/Output Summary (Last 24 hours) at 12/05/15 1614 Last data filed at 12/05/15 0814  Gross per 24 hour  Intake      0 ml  Output   1000 ml  Net  -1000 ml    Exam:   General:  Pt is incoherently mumbling, frail  Cardiovascular: Rate controlled, appreciate S1, S2   Respiratory: No wheezing, no crackles, no rhonchi  Abdomen: (+) BS, non tender   Extremities: No swelling, palpable pulses   Neuro: Nonfocal  Data Reviewed: Basic Metabolic Panel:  Recent Labs Lab 11/30/15 0515 12/02/15 0513 12/03/15 0540  NA  --  146* 141  K  --  3.4* 3.7  CL  --  108 104  CO2  --  26 30  GLUCOSE  --  77 126*  BUN  --  9 6  CREATININE 0.69 0.58 0.49  CALCIUM  --  8.6* 8.6*  MG  --   --  1.5*   Liver Function Tests: No results for input(s): AST, ALT, ALKPHOS, BILITOT, PROT, ALBUMIN in the last 168 hours. No results for input(s): LIPASE, AMYLASE in the last 168 hours. No results for input(s): AMMONIA in the last 168 hours. CBC:  Recent Labs Lab 11/28/15 2007  WBC 4.4  HGB 11.3*  HCT 35.8*  MCV 87.1  PLT 296   Cardiac Enzymes: No results for input(s): CKTOTAL, CKMB, CKMBINDEX, TROPONINI in the last 168 hours. BNP: Invalid input(s): POCBNP CBG: No results for input(s): GLUCAP in the last 168 hours.  No results found for this or any previous visit (from  the past 240 hour(s)).   Scheduled Meds: . aspirin EC  81 mg Oral Daily  . cholecalciferol  1,000 Units Oral Daily  . enoxaparin (LOVENOX) injection  30 mg Subcutaneous Q24H  . folic acid  1 mg Oral BID  . hydroxychloroquine  200 mg Oral BID  . LORazepam  1 mg Intravenous QHS  . methotrexate  15 mg Oral Weekly  . metoprolol  5 mg Intravenous 4 times per day  . multivitamin  5 mL Oral Daily  . OLANZapine  2.5 mg Intramuscular BID  . senna-docusate  1 tablet Oral BID  . simvastatin  40 mg Oral Daily

## 2015-12-05 NOTE — Progress Notes (Signed)
I called and spoke to patient's son, [name], today.  He reports that search being conducted for SNF placement.  I shared with him my concern that his mother still was severely compromised and that she is not maintaining her nutrition/hydration adequately and there is a high likelihood that this will lead to a terminal event.  He reports being "surprised by that" as he thought she was looking better when he visited yesterday.    I shared that in her compromised state, I fear we are now at a point where we are trying to fix problems that are not fixable.  We discussed options for hospice vs SNF with rehab on discharge.  I advised him that I thought his mother would be well served by having palliative care follow her at skilled facility if they decide to pursue rehab.  He reports appreciating the information and that he will need to continue to discuss with his sister.  He again iterated that they are struggling with the fact that no one can tell them exactly what the problem is.  "It's the not knowing that is the worst."  I empathized with the difficult situation they are facing, and let him know our team remains available to help as we can.  Colleen Minus, MD [facility] Palliative Medicine Team [phone]

## 2015-12-05 NOTE — Progress Notes (Signed)
Date: December 05, 2015 Chart reviewed for concurrent status and case management needs. Will continue to follow patient for changes and needs:  Patient remains confused, pcc metg on [id number]/snf placement on going. Marcelle Smiling, RN, BSN, [place]   [phone]

## 2015-12-05 NOTE — Progress Notes (Signed)
Physical Therapy Treatment Patient Details Name: [name] MRN: [id number] DOB: Sep 06, 1932 Today's Date: 12/05/2015    History of Present Illness 82 yo female admitted with dehydration, hypernatremia, worsening confusion and poor intake.  She was at [facility] in Dec '16 for encephalopathy and sepsis due to UTI.  She had been at [facility] until this admission.  Pt also being tx'd for acute psychosis during this admission.   Hx of RA, CABG, chronic low back pain.    PT Comments    Pt continues to require +2 assist for all tasks. Pt does not initiate. Follows commands inconsistently. Pt kept eyes closed for most of session despite stimuli. Pt was able to mumble her first and last name. She nodded yes when asked if she has a daughter. Pt is not progressing with PT at this time.   Follow Up Recommendations  SNF     Equipment Recommendations  None recommended by PT    Recommendations for Other Services       Precautions / Restrictions Precautions Precautions: Fall Restrictions Weight Bearing Restrictions: No    Mobility  Bed Mobility Overal bed mobility: Needs Assistance Bed Mobility: Supine to Sit;Sit to Supine     Supine to sit: Total assist;+2 for physical assistance;+2 for safety/equipment;HOB elevated Sit to supine: Total assist;+2 for physical assistance;+2 for safety/equipment;HOB elevated   General bed mobility comments: Assist for trunk and bil LEs. Utilized bed pad. Increased time.   Transfers                 General transfer comment: NT-pt unable  Ambulation/Gait                 Stairs            Wheelchair Mobility    Modified Rankin (Stroke Patients Only)       Balance Overall balance assessment: Needs assistance   Sitting balance-Leahy Scale: Zero Sitting balance - Comments: total support from therapist; flexed posture; unable to hold head up. Multidirectional LOB.                             Cognition Arousal/Alertness:  Lethargic Behavior During Therapy: Flat affect Overall Cognitive Status: Difficult to assess Area of Impairment: Orientation;Attention;Memory;Following commands;Safety/judgement;Problem solving Orientation Level: Disoriented to;Place;Time;Situation     Following Commands: Follows one step commands inconsistently Safety/Judgement: Decreased awareness of deficits;Decreased awareness of safety          Exercises General Exercises - Upper Extremity Shoulder Flexion: PROM;5 reps;Both;Seated General Exercises - Lower Extremity Long Arc Quad: PROM;Both;5 reps;Seated;Supine    General Comments        Pertinent Vitals/Pain Pain Assessment: Faces Faces Pain Scale: Hurts even more Pain Location: R LE when moved Pain Descriptors / Indicators: Grimacing;Sore Pain Intervention(s): Limited activity within patient's tolerance;Repositioned    Home Living                      Prior Function            PT Goals (current goals can now be found in the care plan section) Progress towards PT goals: Not progressing toward goals - comment (continues to require +2 assist)    Frequency  Min 2X/week    PT Plan Current plan remains appropriate    Co-evaluation             End of Session   Activity Tolerance: Patient limited by lethargy Patient left:  in bed;with call bell/phone within reach;with bed alarm set     Time: 1856-3149 PT Time Calculation (min) (ACUTE ONLY): 18 min  Charges:  $Therapeutic Activity: 8-22 mins                    G Codes:      Rebeca Alert, MPT Pager: [phone]

## 2015-12-06 LAB — BASIC METABOLIC PANEL
ANION GAP: 13 (ref 5–15)
BUN: 12 mg/dL (ref 6–20)
CALCIUM: 9 mg/dL (ref 8.9–10.3)
CO2: 32 mmol/L (ref 22–32)
Chloride: 107 mmol/L (ref 101–111)
Creatinine, Ser: 0.6 mg/dL (ref 0.44–1.00)
GFR calc Af Amer: >60 mL/min (ref >60)
GLUCOSE: 121 mg/dL — AB (ref 65–99)
Potassium: 3.8 mmol/L (ref 3.5–5.1)
Sodium: 152 mmol/L — ABNORMAL HIGH (ref 135–145)

## 2015-12-06 LAB — COMPREHENSIVE METABOLIC PANEL
ALT: 11 U/L — ABNORMAL LOW (ref 14–54)
AST: 19 U/L (ref 15–41)
Albumin: 2.3 g/dL — ABNORMAL LOW (ref 3.5–5.0)
Alkaline Phosphatase: 69 U/L (ref 38–126)
Anion gap: 14 (ref 5–15)
BILIRUBIN TOTAL: 1.1 mg/dL (ref 0.3–1.2)
BUN: 12 mg/dL (ref 6–20)
CO2: 31 mmol/L (ref 22–32)
CREATININE: 0.47 mg/dL (ref 0.44–1.00)
Calcium: 8.7 mg/dL — ABNORMAL LOW (ref 8.9–10.3)
Chloride: 105 mmol/L (ref 101–111)
GFR calc Af Amer: >60 mL/min (ref >60)
Glucose, Bld: 96 mg/dL (ref 65–99)
POTASSIUM: 3.6 mmol/L (ref 3.5–5.1)
Sodium: 150 mmol/L — ABNORMAL HIGH (ref 135–145)
TOTAL PROTEIN: 5.9 g/dL — AB (ref 6.5–8.1)

## 2015-12-06 LAB — CBC
HEMATOCRIT: 35.4 % — AB (ref 36.0–46.0)
Hemoglobin: 10.8 g/dL — ABNORMAL LOW (ref 12.0–15.0)
MCH: 27.3 pg (ref 26.0–34.0)
MCHC: 30.5 g/dL (ref 30.0–36.0)
MCV: 89.6 fL (ref 78.0–100.0)
Platelets: 230 10*3/uL (ref 150–400)
RBC: 3.95 MIL/uL (ref 3.87–5.11)
RDW: 16.6 % — AB (ref 11.5–15.5)
WBC: 6.1 10*3/uL (ref 4.0–10.5)

## 2015-12-06 LAB — MAGNESIUM: Magnesium: 1.9 mg/dL (ref 1.7–2.4)

## 2015-12-06 LAB — PREALBUMIN: PREALBUMIN: 4.4 mg/dL — AB (ref 18–38)

## 2015-12-06 MED ORDER — FLUCONAZOLE IN SODIUM CHLORIDE 100-0.9 MG/50ML-% IV SOLN
100.0000 mg | INTRAVENOUS | Status: DC
  Administered 2015-12-06 – 2015-12-07 (×2): 100 mg via INTRAVENOUS
  Filled 2015-12-06 (×3): qty 50

## 2015-12-06 MED ORDER — POTASSIUM CL IN DEXTROSE 5% 20 MEQ/L IV SOLN
20.0000 meq | INTRAVENOUS | Status: DC
  Administered 2015-12-06 – 2015-12-07 (×2): 20 meq via INTRAVENOUS
  Filled 2015-12-06 (×5): qty 1000

## 2015-12-06 NOTE — Progress Notes (Signed)
Occupational Therapy Treatment Patient Details Name: [name] MRN: [id number] DOB: 05-Feb-1932 Today's Date: 12/06/2015    History of present illness 82 yo female admitted with dehydration, hypernatremia, worsening confusion and poor intake.  She was at [facility] in Dec '16 for encephalopathy and sepsis due to UTI.  She had been at [facility] until this admission.  Pt was also being tx'd for acute psychosis during this admission.   Hx of RA, CABG, chronic low back pain.   OT comments  Pt did not do as well this session as evaluation.  Sat EOB, but did not participate in AAROM.  Appeared to be in pain but unable to state where. Verbalizations were unintelligible.  Did participate in minimal AAROM to RUE when supine.    Follow Up Recommendations  SNF    Equipment Recommendations  None recommended by OT (bed pan)    Recommendations for Other Services      Precautions / Restrictions Precautions Precautions: Fall Precaution Comments: no protective reflexes       Mobility Bed Mobility         Supine to sit: Total assist;+2 for physical assistance;+2 for safety/equipment;HOB elevated Sit to supine: Total assist;+2 for physical assistance;+2 for safety/equipment;HOB elevated   General bed mobility comments: pt did not assist  Transfers                      Balance                                   ADL       Grooming: Wash/dry face;Total assistance (hand over hand assist--did not assist)                                 General ADL Comments: Sat EOB with Total A +2 to get there and total A to maintain.  Attempted to bring neck out of full flexion and pt yelled.  When sitting EOB, more alert but did not follow any commands. When pt returned to supine, she participated in [facility] for FF on R side x 5 reps.  She got  drowsy when I got to L side, and she did not participate.  Pt with good grip strength in R hand.  pt attempted to talk but it was  unintelligible.        Vision                     Perception     Praxis      Cognition   Behavior During Therapy: Flat affect Overall Cognitive Status: Difficult to assess                       Extremity/Trunk Assessment               Exercises     Shoulder Instructions       General Comments      Pertinent Vitals/ Pain       Pain Assessment: Faces Faces Pain Scale: Hurts little more Pain Descriptors / Indicators: Grimacing;Moaning Pain Intervention(s): Patient requesting pain meds-RN notified (pt unable to state where pain was)  Home Living  Prior Functioning/Environment              Frequency Min 1X/week     Progress Toward Goals  OT Goals(current goals can now be found in the care plan section)  Progress towards OT goals: Not progressing toward goals - comment (will reassess next session)     Plan Discharge plan remains appropriate    Co-evaluation                 End of Session     Activity Tolerance Patient limited by fatigue;Patient limited by pain   Patient Left in bed;with bed alarm set   Nurse Communication Patient requests pain meds (IV needed attention)        Time: 1450-1509 OT Time Calculation (min): 19 min  Charges: OT General Charges $OT Visit: 1 Procedure OT Treatments $Therapeutic Activity: 8-22 mins  [name] 12/06/2015, 3:26 PM  [name], OTR/L [phone] 12/06/2015

## 2015-12-06 NOTE — Progress Notes (Signed)
Patient ID: [name], female   DOB: 19-Jun-1932, 82 y.o.   MRN: [id number] [facility] PROGRESS NOTE  [name] JJK:[id number] DOB: [birth year] DOA: 11/23/2015 PCP: Rogelia Boga, MD  Brief narrative:    82 y.o. female with past medical history of ASCVD, CABG, AAA repair, rheumatoid arthritis, chronic low back pain, chronic diastolic CHF, chronic lower extremity venous stasis ulcers, HTN, recent hospitalization 11/13/15-11/19/15 for acute encephalopathy, sepsis secondary to UTI, she was discharged to SNF and then returned to the [facility] ED on 11/23/15 with complaints of worsening confusion/combativeness and decreased oral intake. She was admitted for acute encephalopathy and dehydration with hypernatremia. Hypernatremia and electrolyte abnormalities have resolved but pt continued to be confused and agitated.  Psychiatry was consulted and patient was started on IM Zyprexa (refuses to consistently take oral medications). Recent extensive workup for altered mental status has been negative (negative RPR, TSH, B12). Neurology was consulted but patient unable to cooperate for MRI brain or EEG.  Palliative consulted  for goals of care.  Barrier to discharge: Awaiting bed placement to SNF. She is from [facility] but no beds available there.   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  - Electrolytes corrected but pt continues to have altered mental status - Haldol not effective so now on IM Zyprexa per psych recommendations - TSH, B12, RPR recently unremarkable. Neurology was consulted but patient could not cooperate for MRI brain or EEG. - Palliative care also on board. - SW assisting discharge planning  -not improving, no eating, will have family meeting tomorrow  Active Problems: Recurrent hypernatremia -  Dehydration likely contributing to hypernatremia - na 150 on 1/12, start d5, need continued discussion with family in the setting of persistent oral intake, family has reported 30-50pounds weight loss in the last 8 months. This is likely progressive and subacute to chronic in nature. -goal of care discussion ongoing  Hypokalemia - Supplemented and WNL  Dysphagia - Currently on full liquid diet.  - Continue aspiration precautions.   Dyslipidemia - Continue simvastatin   Acute respiratory failure with hypoxia - Chest x-ray without acute abnormalities.  - Continue oxygen support via [place] to keep O2 sats above 90%  Essential hypertension - Continue metoprolol 5 mg IV Q 6 hours   CAD status post CABG - Continue aspirin  - No reports of chest pain   Rheumatoid arthritis - Continue Plaquenil and Methotrexate  Chronic diastolic CHF - Compensated.  Chronic bilateral lower extremity ulcers secondary to venous stasis / stage II sacral decubitus ulcer - Evaluated by wound care team   Malnutrition of moderate degree - In the context of chronic illness  - Diet as tolerated with aspiration precautions   Yeast in urine, in the setting of confusion, per chart review patient was prescribed po diflucan but refused oral meds, will treat with iv diflucan  FTT, per family patient has 30-50pounds weight loss in the last 35months.  DVT Prophylaxis  - Lovenox subQ   Code Status: DNR/DNI  Family Communication:  Family meeting 1/13 Disposition Plan: SW assisting discharge planning   IV access:  Peripheral IV  Procedures and diagnostic studies:     Dg Chest Port 1 View 11/28/2015  1. No acute cardiopulmonary findings. 2. Hyperinflated lungs with chronic bronchitic markings. 3. Posterior RIGHT rib fractures with nonunion. No change from CT 11/17/2015.  Dg Chest Portable 1 View 11/23/2015  No  acute cardiopulmonary abnormality seen. Electronically Signed   By: Lupita Raider, M.D.   On:  11/23/2015 09:26   Mr Attempted Valora Corporal Report 11/27/2015  This examination belongs to an outside facility and is stored here for comparison purposes only.  Contact the originating outside institution for any associated report or interpretation.  Medical Consultants:  Psychiatry Neurology Palliative care team  IAnti-Infectives:   None    Boston Cookson, MD PhD [facility] Pager [phone]  Time spent in minutes: 35 minutes  If 7PM-7AM, please contact night-coverage [email or web address] Password [facility] 12/06/2015, 8:44 AM   LOS: 12 days    HPI/Subjective: Hallucinating, talking incoherently, not eating  Objective: Filed Vitals:   12/05/15 0534 12/05/15 1240 12/05/15 2232 12/06/15 0704  BP: 107/41 131/38 136/49 114/37  Pulse: 96 91 96 83  Temp: 98.5 F (36.9 C)  99.8 F (37.7 C) 97.7 F (36.5 C)  TempSrc: Axillary  Oral Axillary  Resp: 24  24 20   Height:      Weight:      SpO2: 96%  96% 100%   No intake or output data in the 24 hours ending 12/06/15 0844  Exam:   General:  Pt is incoherently mumbling, frail  Cardiovascular: Rate controlled, appreciate S1, S2   Respiratory: No wheezing, no crackles, no rhonchi  Abdomen: (+) BS, non tender   Extremities: No swelling, palpable pulses   Neuro: Nonfocal  Data Reviewed: Basic Metabolic Panel:  Recent Labs Lab 11/30/15 0515 12/02/15 0513 12/03/15 0540 12/06/15 0540  NA  --  146* 141 150*  K  --  3.4* 3.7 3.6  CL  --  108 104 105  CO2  --  26 30 31   GLUCOSE  --  77 126* 96  BUN  --  9 6 12   CREATININE 0.69 0.58 0.49 0.47  CALCIUM  --  8.6* 8.6* 8.7*  MG  --   --  1.5*  --    Liver Function Tests:  Recent Labs Lab 12/06/15 0540  AST 19  ALT 11*  ALKPHOS 69  BILITOT 1.1  PROT 5.9*  ALBUMIN 2.3*   No results for input(s): LIPASE, AMYLASE in the last 168 hours. No results for input(s): AMMONIA in the last 168 hours. CBC:  Recent Labs Lab 12/06/15 0540  WBC 6.1  HGB 10.8*  HCT 35.4*  MCV 89.6   PLT 230   Cardiac Enzymes: No results for input(s): CKTOTAL, CKMB, CKMBINDEX, TROPONINI in the last 168 hours. BNP: Invalid input(s): POCBNP CBG: No results for input(s): GLUCAP in the last 168 hours.  No results found for this or any previous visit (from the past 240 hour(s)).   Scheduled Meds: . aspirin EC  81 mg Oral Daily  . cholecalciferol  1,000 Units Oral Daily  . enoxaparin (LOVENOX) injection  30 mg Subcutaneous Q24H  . folic acid  1 mg Oral BID  . hydroxychloroquine  200 mg Oral BID  . LORazepam  1 mg Intravenous QHS  . methotrexate  15 mg Oral Weekly  . metoprolol  5 mg Intravenous 4 times per day  . multivitamin  5 mL Oral Daily  . OLANZapine  2.5 mg Intramuscular BID  . senna-docusate  1 tablet Oral BID  . simvastatin  40 mg Oral Daily

## 2015-12-06 NOTE — Care Management Note (Addendum)
Case Management Note  Patient Details  Name: [name] MRN: [id number] Date of Birth: 1932/07/06  Subjective/Objective:         Spoke with the son of the patient. Family wanting snf placement for this patient.  [facility] would be fine.           Action/Plan:   Expected Discharge Date:                  Expected Discharge Plan:  Home w Home Health Services  In-House Referral:     Discharge planning Services  CM Consult  Post Acute Care Choice:    Choice offered to:     DME Arranged:    DME Agency:     HH Arranged:    HH Agency:     Status of Service:  In process, will continue to follow  Medicare Important Message Given:  Yes Date Medicare IM Given:    Medicare IM give by:    Date Additional Medicare IM Given:    Additional Medicare Important Message give by:     If discussed at Long Length of Stay Meetings, dates discussed:  [id number]  Additional Comments:  Golda Acre, RN 12/06/2015, 11:47 AM

## 2015-12-07 DIAGNOSIS — R627 Adult failure to thrive: Secondary | ICD-10-CM | POA: Insufficient documentation

## 2015-12-07 DIAGNOSIS — Z7189 Other specified counseling: Secondary | ICD-10-CM | POA: Insufficient documentation

## 2015-12-07 DIAGNOSIS — Z515 Encounter for palliative care: Secondary | ICD-10-CM | POA: Insufficient documentation

## 2015-12-07 DIAGNOSIS — E860 Dehydration: Secondary | ICD-10-CM | POA: Insufficient documentation

## 2015-12-07 DIAGNOSIS — R4189 Other symptoms and signs involving cognitive functions and awareness: Secondary | ICD-10-CM | POA: Insufficient documentation

## 2015-12-07 DIAGNOSIS — G9340 Encephalopathy, unspecified: Secondary | ICD-10-CM

## 2015-12-07 LAB — BASIC METABOLIC PANEL
ANION GAP: 8 (ref 5–15)
BUN: 10 mg/dL (ref 6–20)
CALCIUM: 8.9 mg/dL (ref 8.9–10.3)
CHLORIDE: 105 mmol/L (ref 101–111)
CO2: 34 mmol/L — AB (ref 22–32)
CREATININE: 0.5 mg/dL (ref 0.44–1.00)
GFR calc non Af Amer: >60 mL/min (ref >60)
Glucose, Bld: 128 mg/dL — ABNORMAL HIGH (ref 65–99)
Potassium: 3.7 mmol/L (ref 3.5–5.1)
SODIUM: 147 mmol/L — AB (ref 135–145)

## 2015-12-07 LAB — MAGNESIUM: MAGNESIUM: 1.8 mg/dL (ref 1.7–2.4)

## 2015-12-07 MED ORDER — ENOXAPARIN SODIUM 40 MG/0.4ML ~~LOC~~ SOLN
40.0000 mg | SUBCUTANEOUS | Status: DC
Start: 2015-12-07 — End: 2015-12-10
  Administered 2015-12-07: 40 mg via SUBCUTANEOUS
  Administered 2015-12-08: 30 mg via SUBCUTANEOUS
  Administered 2015-12-09: 40 mg via SUBCUTANEOUS
  Filled 2015-12-07 (×4): qty 0.4

## 2015-12-07 MED ORDER — OLANZAPINE 5 MG PO TBDP
5.0000 mg | ORAL_TABLET | Freq: Every day | ORAL | Status: DC
  Filled 2015-12-07 (×3): qty 1

## 2015-12-07 MED ORDER — OLANZAPINE 10 MG IM SOLR
2.5000 mg | Freq: Two times a day (BID) | INTRAMUSCULAR | Status: DC | PRN
  Administered 2015-12-07: 2.5 mg via INTRAMUSCULAR
  Filled 2015-12-07 (×2): qty 10

## 2015-12-07 NOTE — Clinical Social Work Note (Addendum)
CSW left messages with pt's son [name] to confirm he'd still like to place his mother at [facility], SNF. CSW left a message for Toniann Fail in admissions at [facility] to confirm that a bed is still available for pt. CSW will continue to follow for d/c needs.  Update/10:20am- per Toniann Fail at [facility] there is a female bed today for the pt. CSW is awaiting to here back from family and speak to attending regarding possible d/c today.    Etta Quill, LCSW [phone] Hospital psychiatric & 5E, 5W 31-35 Licensed Clinical Social Worker

## 2015-12-07 NOTE — Progress Notes (Signed)
Patient ID: [name], female   DOB: May 19, 1932, 82 y.o.   MRN: [id number] [facility] PROGRESS NOTE  [name] PQZ:[id number] DOB: [birth year] DOA: 11/23/2015 PCP: Rogelia Boga, MD  Brief narrative:    82 y.o. female with past medical history of ASCVD, CABG, AAA repair, rheumatoid arthritis, chronic low back pain, chronic diastolic CHF, chronic lower extremity venous stasis ulcers, HTN, recent hospitalization 11/13/15-11/19/15 for acute encephalopathy, sepsis secondary to UTI, she was discharged to SNF and then returned to the [facility] ED on 11/23/15 with complaints of worsening confusion/combativeness and decreased oral intake. She was admitted for acute encephalopathy and dehydration with hypernatremia. Hypernatremia and electrolyte abnormalities have resolved but pt continued to be confused and agitated.  Psychiatry was consulted and patient was started on IM Zyprexa (refuses to consistently take oral medications). Recent extensive workup for altered mental status has been negative (negative RPR, TSH, B12). Neurology was consulted but patient unable to cooperate for MRI brain or EEG.  Palliative consulted  for goals of care.  Barrier to discharge: Awaiting bed placement to SNF. She is from [facility] but no beds available there.   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  - Electrolytes corrected but pt continues to have altered mental status - Haldol not effective so now on IM Zyprexa per psych recommendations - TSH, B12, RPR recently unremarkable. Neurology was consulted but patient could not cooperate for MRI brain or EEG. - Palliative care also on board. - SW assisting discharge planning  -not improving, no eating,  family meeting on 1/13, no feeding tube, likely hospice  Active  Problems: Recurrent hypernatremia - Dehydration likely contributing to hypernatremia - na 150 on 1/12, start d5, need continued discussion with family in the setting of persistent oral intake, family has reported 30-50pounds weight loss in the last 8 months. This is likely progressive and subacute to chronic in nature. -goal of care discussion ongoing  Hypokalemia - Supplemented and WNL  Dysphagia - Currently on full liquid diet.  - Continue aspiration precautions.   Dyslipidemia - Continue simvastatin   Acute respiratory failure with hypoxia - Chest x-ray without acute abnormalities.  - Continue oxygen support via [place] to keep O2 sats above 90%  Essential hypertension - Continue metoprolol 5 mg IV Q 6 hours   CAD status post CABG - Continue aspirin  - No reports of chest pain   Rheumatoid arthritis - Continue Plaquenil and Methotrexate  Chronic diastolic CHF - Compensated.  Chronic bilateral lower extremity ulcers secondary to venous stasis / stage II sacral decubitus ulcer - Evaluated by wound care team   Malnutrition of moderate degree - In the context of chronic illness  - Diet as tolerated with aspiration precautions   Yeast in urine, in the setting of confusion, per chart review patient was prescribed po diflucan but refused oral meds, will treat with iv diflucan  FTT, per family patient has 30-50pounds weight loss in the last 31months.  DVT Prophylaxis  - Lovenox subQ   Code Status: DNR/DNI  Family Communication:  Family meeting 1/13 with son and daughter from 1pm to 2pm Disposition Plan: SW assisting discharge planning   IV access:  Peripheral IV  Procedures and diagnostic studies:     Dg Chest Port 1 View 11/28/2015  1. No acute cardiopulmonary findings. 2. Hyperinflated lungs with chronic bronchitic markings. 3. Posterior RIGHT rib fractures with nonunion. No  change from CT 11/17/2015.  Dg Chest Portable 1 View 11/23/2015  No acute cardiopulmonary  abnormality seen. Electronically Signed   By: Lupita Raider, M.D.   On: 11/23/2015 09:26   Mr Attempted Valora Corporal Report 11/27/2015  This examination belongs to an outside facility and is stored here for comparison purposes only.  Contact the originating outside institution for any associated report or interpretation.  Medical Consultants:  Psychiatry Neurology Palliative care team  IAnti-Infectives:   None    Colleen Lorey, MD PhD [facility] Pager [phone]  Time spent in minutes: 35 minutes  If 7PM-7AM, please contact night-coverage [email or web address] Password [email or web address] 12/07/2015, 7:08 PM   LOS: 13 days    HPI/Subjective: Hallucinating, talking incoherently, not eating, not taking meds  Objective: Filed Vitals:   12/06/15 1534 12/06/15 2154 12/07/15 0551 12/07/15 1516  BP: 132/48 129/56 116/36 114/54  Pulse: 92 107 86 94  Temp: 98.2 F (36.8 C) 98.1 F (36.7 C) 98.1 F (36.7 C) 97.9 F (36.6 C)  TempSrc: Axillary Axillary Axillary Axillary  Resp: 18 18 18 18   Height:      Weight:      SpO2: 97% 100% 100% 93%    Intake/Output Summary (Last 24 hours) at 12/07/15 1908 Last data filed at 12/07/15 1149  Gross per 24 hour  Intake   1495 ml  Output    275 ml  Net   1220 ml    Exam:   General:  Pt is incoherently mumbling, frail  Cardiovascular: Rate controlled, appreciate S1, S2   Respiratory: No wheezing, no crackles, no rhonchi  Abdomen: (+) BS, non tender   Extremities: No swelling, palpable pulses   Neuro: Nonfocal  Data Reviewed: Basic Metabolic Panel:  Recent Labs Lab 12/02/15 0513 12/03/15 0540 12/06/15 0540 12/06/15 1511 12/07/15 0534  NA 146* 141 150* 152* 147*  K 3.4* 3.7 3.6 3.8 3.7  CL 108 104 105 107 105  CO2 26 30 31  32 34*  GLUCOSE 77 126* 96 121* 128*  BUN 9 6 12 12 10   CREATININE 0.58 0.49 0.47 0.60 0.50  CALCIUM 8.6* 8.6* 8.7* 9.0 8.9  MG  --  1.5* 1.9  --  1.8   Liver Function Tests:  Recent Labs Lab 12/06/15 0540  AST  19  ALT 11*  ALKPHOS 69  BILITOT 1.1  PROT 5.9*  ALBUMIN 2.3*   No results for input(s): LIPASE, AMYLASE in the last 168 hours. No results for input(s): AMMONIA in the last 168 hours. CBC:  Recent Labs Lab 12/06/15 0540  WBC 6.1  HGB 10.8*  HCT 35.4*  MCV 89.6  PLT 230   Cardiac Enzymes: No results for input(s): CKTOTAL, CKMB, CKMBINDEX, TROPONINI in the last 168 hours. BNP: Invalid input(s): POCBNP CBG: No results for input(s): GLUCAP in the last 168 hours.  No results found for this or any previous visit (from the past 240 hour(s)).   Scheduled Meds: . aspirin EC  81 mg Oral Daily  . cholecalciferol  1,000 Units Oral Daily  . enoxaparin (LOVENOX) injection  40 mg Subcutaneous Q24H  . fluconazole (DIFLUCAN) IV  100 mg Intravenous Q24H  . folic acid  1 mg Oral BID  . hydroxychloroquine  200 mg Oral BID  . LORazepam  1 mg Intravenous QHS  . methotrexate  15 mg Oral Weekly  . metoprolol  5 mg Intravenous 4 times per day  . multivitamin  5 mL Oral Daily  . OLANZapine zydis  5 mg Oral Daily  .  senna-docusate  1 tablet Oral BID  . simvastatin  40 mg Oral Daily

## 2015-12-07 NOTE — Clinical Social Work Note (Signed)
Family and MD is still uncertain about SNF vs hospice placement. CSW is continuing to follow.    Etta Quill, LCSW [phone] Hospital psychiatric & 5E, 5W 31-35 Licensed Clinical Social Worker

## 2015-12-07 NOTE — Consult Note (Signed)
[facility] Face-to-Face Psychiatry Consult follow-up  Reason for Consult:  Confused, combative with staff and AMS Referring Physician:  Dr. Erlinda Hong Patient Identification: [name] MRN:  [id number] Principal Diagnosis: Acute encephalopathy Diagnosis:   Patient Active Problem List   Diagnosis Date Noted  . Cognitive decline [R41.89]   . Encounter for palliative care [Z51.5]   . Goals of care, counseling/discussion [Z71.89]   . Hypokalemia [E87.6] 12/04/2015  . Dyslipidemia [E78.5] 12/04/2015  . Acute respiratory failure with hypoxia ([place]) [J96.01] 12/04/2015  . Paranoid psychosis ([place]) [F22] 11/28/2015  . Dehydration with hypernatremia [E87.0] 11/23/2015  . Malnutrition of moderate degree [E44.0] 11/14/2015  . Acute encephalopathy [G93.40] 11/14/2015  . Rheumatoid arthritis ([place]) [M06.9] 11/13/2015  . CORONARY ATHEROSCLEROSIS, ARTERY BYPASS GRAFT [I25.810] 08/23/2008  . Essential hypertension [I10] 05/11/2008    Total Time spent with patient: 30 minutes  Subjective:   [name] is a 82 y.o. female patient admitted with AMS.  HPI:  [name] is a 82 y.o. female, recent resident of SNF admitted to [facility] for increased symptoms of worsening confusion, emotion instability and combativeness with staff. patient has been frustrated, upset because no one believe her. She is poor historian and has no family at bed side. She has been suffering with loss of singificant cognitions. She does not know why she was in hospital and her current medical or emotional problems. She knows her name, age and being hospitalized. She complaints about not able to eat, drink and sleep better. She denied suicide or homicide ideation, intention or plans. Will ask unit social service to contact SNF and family contact regarding her baseline functioning. She has a walker in her room and has multiple laceration on her legs and hands. Past Psychiatric History: None reported   12/07/2015 Interval history: Patient  seen for psychiatric consultation follow-up today and case discussed with staff RN and Dr. Erlinda Hong. Patient has been much calm without irritability and agitation. Her mental status has not changed and some what deteriorated. Patient appeared sitting in a bed with the head end was elevated and occasionally responding with gibberish.Sh is confused and unable to care for herself. Staff reported that she has ate two spoonful of soft diet when assisted without refusing.   Will start Zyprex Zydis 5 mg daily for psychosis and agitation. Will change Zyprexa 2.5 mg IM twice daily as needed for aggression and combative behaviors. She is tolerating with out EPS. Continue Lorazepam 1 mg at bedtime for insomnia and agitation.     Risk to Self: Is patient at risk for suicide?: No Risk to Others:   Prior Inpatient Therapy:   Prior Outpatient Therapy:    Past Medical History:  History reviewed. No pertinent past medical history.  Past Surgical History  Procedure Laterality Date  . Appendectomy    . Cesarean section  1968    x 1  . Hernia repair    . Coronary artery bypass graft  August 2009    CABG x 1 with LIMA to LAD /  . Ascending aortic aneurysm repair  August 2009     resection and grafting of a 5.2-cm ascending aortic arch aneurysm  . Joint replacement      2009 & 2010- both knees  . Back surgery      2011- cerv. fusion   . Breast surgery      biopsy x2- R breast  . Tonsillectomy      as a child  . Cardiac catheterization  2009  .  Colonoscopy    . Lumbar laminectomy/decompression microdiscectomy Bilateral 07/11/2013    Procedure: LUMBAR LAMINECTOMY/DECOMPRESSION MICRODISCECTOMY LUMBAR FIVE SACRAL ONE;  Surgeon: Elaina Hoops, MD;  Location: [facility];  Service: Neurosurgery;  Laterality: Bilateral;   Family History:  Family History  Problem Relation Age of Onset  . Heart disease    . Coronary artery disease    . Anesthesia problems Neg Hx   . Hypotension Neg Hx   . Malignant hyperthermia  Neg Hx   . Pseudochol deficiency Neg Hx   . Heart disease Mother   . Stroke Father   . Cancer Maternal Grandmother   . Stroke Paternal Grandmother    Family Psychiatric  History: unknown Social History:  History  Alcohol Use No     History  Drug Use No    Social History   Social History  . Marital Status: Widowed    Spouse Name: N/A  . Number of Children: 2  . Years of Education: N/A   Occupational History  . retired    Social History Main Topics  . Smoking status: Never Smoker   . Smokeless tobacco: Never Used  . Alcohol Use: No  . Drug Use: No  . Sexual Activity: No   Other Topics Concern  . None   Social History Narrative   Patient is right handed.   Patient drinks a little caffeine daily.   Additional Social History:                          Allergies:   Allergies  Allergen Reactions  . Amoxicillin     REACTION: unspecified  . Cyclobenzaprine Hcl     REACTION: rash  . Mupirocin     Unknown reaction  . Penicillins Hives  . Tetanus Toxoid Hives    Labs:  Results for orders placed or performed during the hospital encounter of 11/23/15 (from the past 48 hour(s))  CBC     Status: Abnormal   Collection Time: 12/06/15  5:40 AM  Result Value Ref Range   WBC 6.1 4.0 - 10.5 K/uL   RBC 3.95 3.87 - 5.11 MIL/uL   Hemoglobin 10.8 (L) 12.0 - 15.0 g/dL   HCT 35.4 (L) 36.0 - 46.0 %   MCV 89.6 78.0 - 100.0 fL   MCH 27.3 26.0 - 34.0 pg   MCHC 30.5 30.0 - 36.0 g/dL   RDW 16.6 (H) 11.5 - 15.5 %   Platelets 230 150 - 400 K/uL  Comprehensive metabolic panel     Status: Abnormal   Collection Time: 12/06/15  5:40 AM  Result Value Ref Range   Sodium 150 (H) 135 - 145 mmol/L   Potassium 3.6 3.5 - 5.1 mmol/L   Chloride 105 101 - 111 mmol/L   CO2 31 22 - 32 mmol/L   Glucose, Bld 96 65 - 99 mg/dL   BUN 12 6 - 20 mg/dL   Creatinine, Ser 0.47 0.44 - 1.00 mg/dL   Calcium 8.7 (L) 8.9 - 10.3 mg/dL   Total Protein 5.9 (L) 6.5 - 8.1 g/dL   Albumin 2.3 (L)  3.5 - 5.0 g/dL   AST 19 15 - 41 U/L   ALT 11 (L) 14 - 54 U/L   Alkaline Phosphatase 69 38 - 126 U/L   Total Bilirubin 1.1 0.3 - 1.2 mg/dL   GFR calc non Af Amer >60 >60 mL/min   GFR calc Af Amer >60 >60 mL/min  Comment: (NOTE) The eGFR has been calculated using the CKD EPI equation. This calculation has not been validated in all clinical situations. eGFR's persistently <60 mL/min signify possible Chronic Kidney Disease.    Anion gap 14 5 - 15  Prealbumin     Status: Abnormal   Collection Time: 12/06/15  5:40 AM  Result Value Ref Range   Prealbumin 4.4 (L) 18 - 38 mg/dL    Comment: Performed at [facility]  Magnesium     Status: None   Collection Time: 12/06/15  5:40 AM  Result Value Ref Range   Magnesium 1.9 1.7 - 2.4 mg/dL  Basic metabolic panel     Status: Abnormal   Collection Time: 12/06/15  3:11 PM  Result Value Ref Range   Sodium 152 (H) 135 - 145 mmol/L   Potassium 3.8 3.5 - 5.1 mmol/L   Chloride 107 101 - 111 mmol/L   CO2 32 22 - 32 mmol/L   Glucose, Bld 121 (H) 65 - 99 mg/dL   BUN 12 6 - 20 mg/dL   Creatinine, Ser 0.60 0.44 - 1.00 mg/dL   Calcium 9.0 8.9 - 10.3 mg/dL   GFR calc non Af Amer >60 >60 mL/min   GFR calc Af Amer >60 >60 mL/min    Comment: (NOTE) The eGFR has been calculated using the CKD EPI equation. This calculation has not been validated in all clinical situations. eGFR's persistently <60 mL/min signify possible Chronic Kidney Disease.    Anion gap 13 5 - 15  Basic metabolic panel     Status: Abnormal   Collection Time: 12/07/15  5:34 AM  Result Value Ref Range   Sodium 147 (H) 135 - 145 mmol/L   Potassium 3.7 3.5 - 5.1 mmol/L   Chloride 105 101 - 111 mmol/L   CO2 34 (H) 22 - 32 mmol/L   Glucose, Bld 128 (H) 65 - 99 mg/dL   BUN 10 6 - 20 mg/dL   Creatinine, Ser 0.50 0.44 - 1.00 mg/dL   Calcium 8.9 8.9 - 10.3 mg/dL   GFR calc non Af Amer >60 >60 mL/min   GFR calc Af Amer >60 >60 mL/min    Comment: (NOTE) The eGFR has been  calculated using the CKD EPI equation. This calculation has not been validated in all clinical situations. eGFR's persistently <60 mL/min signify possible Chronic Kidney Disease.    Anion gap 8 5 - 15  Magnesium     Status: None   Collection Time: 12/07/15  5:34 AM  Result Value Ref Range   Magnesium 1.8 1.7 - 2.4 mg/dL    Current Facility-Administered Medications  Medication Dose Route Frequency Provider Last Rate Last Dose  . acetaminophen (TYLENOL) tablet 650 mg  650 mg Oral Q6H PRN Modena Jansky, MD       Or  . acetaminophen (TYLENOL) suppository 650 mg  650 mg Rectal Q6H PRN Modena Jansky, MD      . albuterol (PROVENTIL) (2.5 MG/3ML) 0.083% nebulizer solution 2.5 mg  2.5 mg Nebulization Q2H PRN Modena Jansky, MD      . aspirin EC tablet 81 mg  81 mg Oral Daily Modena Jansky, MD   81 mg at 11/23/15 1619  . cholecalciferol (VITAMIN D) tablet 1,000 Units  1,000 Units Oral Daily Modena Jansky, MD   1,000 Units at 11/23/15 1619  . dextrose 5 % with KCl 20 mEq / L  infusion  20 mEq Intravenous Continuous Florencia Reasons, MD [phone]  mL/hr at 12/07/15 1411 20 mEq at 12/07/15 1411  . enoxaparin (LOVENOX) injection 40 mg  40 mg Subcutaneous Q24H Thomes Lolling, RPH      . fluconazole (DIFLUCAN) IVPB 100 mg  100 mg Intravenous Q24H Florencia Reasons, MD   100 mg at 12/06/15 1731  . folic acid (FOLVITE) tablet 1 mg  1 mg Oral BID Modena Jansky, MD   1 mg at 11/29/15 2314  . hydroxychloroquine (PLAQUENIL) tablet 200 mg  200 mg Oral BID Modena Jansky, MD   200 mg at 11/29/15 2314  . LORazepam (ATIVAN) injection 1 mg  1 mg Intravenous QHS Gardiner Barefoot, NP   1 mg at 12/05/15 2112  . LORazepam (ATIVAN) injection 1 mg  1 mg Intravenous Q6H PRN Gardiner Barefoot, NP   1 mg at 12/07/15 0233  . methotrexate (RHEUMATREX) tablet 15 mg  15 mg Oral Weekly Modena Jansky, MD   15 mg at 11/29/15 1000  . metoprolol (LOPRESSOR) injection 5 mg  5 mg Intravenous 4 times per day Barton Dubois, MD   5  mg at 12/07/15 1211  . multivitamin liquid 5 mL  5 mL Oral Daily Clayton Bibles, RD   5 mL at 11/24/15 1509  . OLANZapine (ZYPREXA) injection 2.5 mg  2.5 mg Intramuscular BID PRN Ambrose Finland, MD      . OLANZapine zydis (ZYPREXA) disintegrating tablet 5 mg  5 mg Oral Daily Ambrose Finland, MD   0 mg at 12/07/15 1200  . senna-docusate (Senokot-S) tablet 1 tablet  1 tablet Oral BID Modena Jansky, MD   1 tablet at 11/29/15 2344  . simvastatin (ZOCOR) tablet 40 mg  40 mg Oral Daily Modena Jansky, MD   40 mg at 11/23/15 1619    Musculoskeletal: Strength & Muscle Tone: decreased Gait & Station: unable to stand Patient leans: N/A  Psychiatric Specialty Exam: ROS   Blood pressure 116/36, pulse 86, temperature 98.1 F (36.7 C), temperature source Axillary, resp. rate 18, height '5\' 2"'  (1.575 m), weight 52 kg (114 lb 10.2 oz), SpO2 100 %.Body mass index is 20.96 kg/(m^2).  General Appearance: Bizarre and Guarded  Eye Contact::  Poor  Speech:  Slow and Slurred  Volume:  Decreased  Mood:  Anxious and Depressed  Affect:  Constricted  Thought Process:  Disorganized and Irrelevant  Orientation:  Negative  Thought Content:  Rumination and agitation  Suicidal Thoughts:  No  Homicidal Thoughts:  No  Memory:  Immediate;   Poor Recent;   Poor  Judgement:  Impaired  Insight:  Shallow  Psychomotor Activity:  Decreased  Concentration:  Fair  Recall:  [employer] of Knowledge:Fair  Language: Fair  Akathisia:  Negative  Handed:  Right  AIMS (if indicated):     Assets:  Communication Skills Desire for Improvement Financial Resources/Insurance Leisure Time Resilience Social Support  ADL's:  Impaired  Cognition: Impaired,  Mild  Sleep:      Treatment Plan Summary: Daily contact with patient to assess and evaluate symptoms and progress in treatment and Medication management   Start Zyprexa Zydis 5 mg sublingual daily for psychosis Change Zyprexa 2.5 mg IM twice  daily/PRN for paranoid psychosis Continue Lorazepam 1 mg PO Qhs for insomnia, agitation and restlessness Appreciate psychiatric consultation and follow up as clinically required Please contact [phone] or [phone] if needs further assistance    Disposition: Patient does not meet criteria for psychiatric inpatient admission. Supportive therapy provided about ongoing  stressors.  Patient will be referred to SNF with palliative care needs  [facility] R. 12/07/2015 3:04 PM

## 2015-12-07 NOTE — Progress Notes (Signed)
Daily Progress Note   Patient Name: [name]       Date: 12/07/2015 DOB: 12-11-1931  Age: 82 y.o. MRN#: [id number] Attending Physician: Florencia Reasons, MD Primary Care Physician: [name], MD Admit Date: 11/23/2015  Reason for Consultation/Follow-up: Establishing goals of care  Subjective:  did not participate well in PT, continues to not eat, does not take PO medications.   Family meeting:  Met with the patient's son Cay Schillings and Daughter Colletta [place] and Dr Erlinda Hong for about 60 minutes this afternoon: Discussed the patient's current status as well as her underlying clinical decline status both functionally and cognitively for the past several months.  Discussed that the patient is not eating. Discussed the temporary measures of IVF resuscitation. Discussed extensively about PEG tube. Family decided not to place PEG tube, that it would not be in line with the patient's wishes.  Discussed about D/C the patient to SNF with the addition of hospice services as an extra layer of support, discussed that the prognosis could be 1-2 weeks at this point.  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   Length of Stay: 13 days  Current Medications: Scheduled Meds:  . aspirin EC  81 mg Oral Daily  . cholecalciferol  1,000 Units Oral Daily  . enoxaparin (LOVENOX) injection  40 mg Subcutaneous Q24H  . fluconazole (DIFLUCAN) IV  100 mg Intravenous Q24H  . folic acid  1 mg Oral BID  . hydroxychloroquine  200 mg Oral BID  . LORazepam  1 mg Intravenous QHS  . methotrexate  15 mg Oral Weekly  . metoprolol  5 mg Intravenous 4 times per day  . multivitamin  5 mL Oral Daily  . OLANZapine zydis  5 mg Oral Daily  .  senna-docusate  1 tablet Oral BID  . simvastatin  40 mg Oral Daily    Continuous Infusions: . dextrose 5 % with KCl 20 mEq / L 20 mEq (12/06/15 1009)    PRN Meds: acetaminophen **OR** acetaminophen, albuterol, LORazepam, OLANZapine  Physical Exam: Physical Exam             Weak frail lady mild distress Does not verbalize meaningfully Edema trace s1 S2 Lungs shallow breathing  Vital Signs: BP 116/36 mmHg  Pulse 86  Temp(Src) 98.1 F (36.7 C) (Axillary)  Resp 18  Ht '5\' 2"'  (1.575 m)  Wt 52 kg (114 lb 10.2 oz)  BMI 20.96 kg/m2  SpO2 100% SpO2: SpO2: 100 % O2 Device: O2 Device: Nasal Cannula O2 Flow Rate: O2 Flow Rate (L/min): 4 L/min  Intake/output summary:  Intake/Output Summary (Last 24 hours) at 12/07/15 1402 Last data filed at 12/07/15 1149  Gross per 24 hour  Intake   1495 ml  Output    775 ml  Net    720 ml   LBM: Last BM Date: 11/27/15 Baseline Weight: Weight: 49.1 kg (108 lb 3.9 oz) Most recent weight: Weight: 52 kg (114 lb 10.2 oz)       Palliative Assessment/Data: Flowsheet Rows        Most Recent Value   Intake Tab    Referral Department  Hospitalist   Unit at Time of Referral  Med/Surg Unit   Palliative Care Primary Diagnosis  Neurology   Date Notified  11/30/15   Palliative Care Type  Return patient Palliative Care   Reason for referral  Clarify Goals of Care, End of [place], [place]   Date of Admission  11/23/15   Date first seen by Palliative Care  12/03/15   # of days Palliative referral response time  3 Day(s)   # of days IP prior to Palliative referral  7   Clinical Assessment    Palliative Performance Scale Score  20%   Pain Max last 24 hours  4   Pain Min Last 24 hours  3   Dyspnea Max Last 24 Hours  3   Dyspnea Min Last 24 hours  2   Psychosocial & Spiritual Assessment    Social Work Plan of Care  Clarified patient/family wishes with healthcare team   Palliative Care Outcomes    Patient/Family meeting  held?  Yes   Who was at the meeting?  son daughter Dr Erlinda Hong   Palliative Care Outcomes  Clarified goals of care, Counseled regarding hospice, Changed to focus on comfort   Palliative Care follow-up planned  Yes, Facility      Additional Data Reviewed: CBC    Component Value Date/Time   WBC 6.1 12/06/2015 0540   RBC 3.95 12/06/2015 0540   HGB 10.8* 12/06/2015 0540   HCT 35.4* 12/06/2015 0540   HCT 29.2* 11/13/2015 2225   PLT 230 12/06/2015 0540   MCV 89.6 12/06/2015 0540   MCH 27.3 12/06/2015 0540   MCHC 30.5 12/06/2015 0540   RDW 16.6* 12/06/2015 0540   LYMPHSABS 1.8 11/23/2015 0750   MONOABS 0.8 11/23/2015 0750   EOSABS 0.2 11/23/2015 0750   BASOSABS 0.0 11/23/2015 0750    CMP     Component Value Date/Time   NA 147* 12/07/2015 0534   K 3.7 12/07/2015 0534   CL 105 12/07/2015 0534   CO2 34* 12/07/2015 0534   GLUCOSE 128* 12/07/2015 0534   BUN 10 12/07/2015 0534   CREATININE 0.50 12/07/2015 0534   CREATININE 0.54* 09/14/2015 1139   CALCIUM 8.9 12/07/2015 0534   PROT 5.9* 12/06/2015 0540   PROT 6.3 05/17/2015 1705   ALBUMIN 2.3* 12/06/2015 0540   AST 19 12/06/2015 0540   ALT 11* 12/06/2015 0540   ALKPHOS 69 12/06/2015 0540   BILITOT 1.1 12/06/2015 0540   GFRNONAA >60 12/07/2015 0534   GFRAA >60 12/07/2015 0534       Problem List:  Patient Active Problem List  Diagnosis Date Noted  . Hypokalemia 12/04/2015  . Dyslipidemia 12/04/2015  . Acute respiratory failure with hypoxia ([place]) 12/04/2015  . Paranoid psychosis ([place]) 11/28/2015  . Dehydration with hypernatremia 11/23/2015  . Malnutrition of moderate degree 11/14/2015  . Acute encephalopathy 11/14/2015  . Rheumatoid arthritis ([place]) 11/13/2015  . CORONARY ATHEROSCLEROSIS, ARTERY BYPASS GRAFT 08/23/2008  . Essential hypertension 05/11/2008     Palliative Care Assessment & Plan    1.Code Status:  DNR    Code Status Orders        Start     Ordered   11/23/15 1318  Do not attempt resuscitation  (DNR)   Continuous    Question Answer Comment  In the event of cardiac or respiratory ARREST Do not call a "code blue"   In the event of cardiac or respiratory ARREST Do not perform Intubation, CPR, defibrillation or ACLS   In the event of cardiac or respiratory ARREST Use medication by any route, position, wound care, and other measures to relive pain and suffering. May use oxygen, suction and manual treatment of airway obstruction as needed for comfort.      11/23/15 1317    Code Status History    Date Active Date Inactive Code Status Order ID Comments User Context   11/13/2015  8:32 PM 11/19/2015  6:25 PM DNR [id number]  Vianne Bulls, MD ED    Advance Directive Documentation        Most Recent Value   Type of Advance Directive  Out of facility DNR (pink MOST or yellow form), Living will   Pre-existing out of facility DNR order (yellow form or pink MOST form)  Yellow form placed in chart (order not valid for inpatient use)   "MOST" Form in Place?         2. Goals of Care/Additional Recommendations:   No feeding tube, SNF with Hospice  Limitations on Scope of Treatment: focus on comfort care  Desire for further Chaplaincy support:no  Psycho-social Needs: Caregiving  Support/Resources  3. Symptom Management:      1. Continue current management  4. Palliative Prophylaxis:   Delirium Protocol  5. Prognosis: < 2 weeks  6. Discharge Planning:  [place] with Hospice   Care plan was discussed with  Patient's son and daughter, SW Judson Roch and Dr Erlinda Hong  Thank you for allowing the Palliative Medicine Team to assist in the care of this patient.   Time In: 1300 Time Out: 1400 Total Time 60 Prolonged Time Billed  no        [phone] Loistine Chance, MD  12/07/2015, 2:02 PM  Please contact Palliative Medicine Team phone at [phone] for questions and concerns.

## 2015-12-07 NOTE — Progress Notes (Signed)
Date: December 07, 2015 Chart reviewed for concurrent status and case management needs. Will continue to follow patient for changes and needs: Marcelle Smiling, RN, BSN, [place]   [phone]

## 2015-12-08 ENCOUNTER — Inpatient Hospital Stay (HOSPITAL_COMMUNITY): Payer: Medicare Other

## 2015-12-08 DIAGNOSIS — J9601 Acute respiratory failure with hypoxia: Secondary | ICD-10-CM

## 2015-12-08 LAB — BASIC METABOLIC PANEL
Anion gap: 9 (ref 5–15)
BUN: 7 mg/dL (ref 6–20)
CHLORIDE: 99 mmol/L — AB (ref 101–111)
CO2: 34 mmol/L — AB (ref 22–32)
CREATININE: 0.5 mg/dL (ref 0.44–1.00)
Calcium: 9 mg/dL (ref 8.9–10.3)
GFR calc Af Amer: >60 mL/min (ref >60)
GFR calc non Af Amer: >60 mL/min (ref >60)
GLUCOSE: 119 mg/dL — AB (ref 65–99)
POTASSIUM: 4.2 mmol/L (ref 3.5–5.1)
SODIUM: 142 mmol/L (ref 135–145)

## 2015-12-08 MED ORDER — LORAZEPAM 2 MG/ML IJ SOLN: 0.5000 mg | Freq: Four times a day (QID) | INTRAMUSCULAR | Status: DC | PRN

## 2015-12-08 MED ORDER — METOPROLOL TARTRATE 1 MG/ML IV SOLN
2.5000 mg | Freq: Four times a day (QID) | INTRAVENOUS | Status: DC
  Administered 2015-12-09 – 2015-12-10 (×7): 2.5 mg via INTRAVENOUS
  Filled 2015-12-08 (×8): qty 5

## 2015-12-08 NOTE — Progress Notes (Signed)
Patient ID: [name], female   DOB: 08/26/1932, 82 y.o.   MRN: [id number] [facility] PROGRESS NOTE  [name] EXB:[id number] DOB: [birth year] DOA: 11/23/2015 PCP: Rogelia Boga, MD  Brief narrative:    82 y.o. female with past medical history of ASCVD, CABG, AAA repair, rheumatoid arthritis, chronic low back pain, chronic diastolic CHF, chronic lower extremity venous stasis ulcers, HTN, recent hospitalization 11/13/15-11/19/15 for acute encephalopathy, sepsis secondary to UTI, she was discharged to SNF and then returned to the [facility] ED on 11/23/15 with complaints of worsening confusion/combativeness and decreased oral intake. She was admitted for acute encephalopathy and dehydration with hypernatremia. Hypernatremia and electrolyte abnormalities have resolved but pt continued to be confused and agitated.  Psychiatry was consulted and patient was started on IM Zyprexa (refuses to consistently take oral medications). Recent extensive workup for altered mental status has been negative (negative RPR, TSH, B12). Neurology was consulted but patient unable to cooperate for MRI brain or EEG.  Palliative consulted  for goals of care.  Barrier to discharge: Awaiting bed placement to SNF. She is from [facility] but no beds available there.   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  - Electrolytes corrected but pt continues to have altered mental status - Haldol not effective so now on IM Zyprexa per psych recommendations - TSH, B12, RPR recently unremarkable. Neurology was consulted but patient could not cooperate for MRI brain or EEG. - Palliative care also on board. - SW assisting discharge planning  -not improving, no eating,  family meeting on 1/13, no feeding tube, likely hospice  Active  Problems: Recurrent hypernatremia - Dehydration likely contributing to hypernatremia - na 150 on 1/12, start d5, need continued discussion with family in the setting of persistent oral intake, family has reported 30-50pounds weight loss in the last 8 months. This is likely progressive and subacute to chronic in nature. -goal of care discussion ongoing  Hypokalemia - Supplemented and WNL  Dysphagia - Currently on full liquid diet.  - Continue aspiration precautions.   Dyslipidemia - Continue simvastatin   Acute respiratory failure with hypoxia - Chest x-ray without acute abnormalities.  - Continue oxygen support via [place] to keep O2 sats above 90%  Essential hypertension - Continue metoprolol 5 mg IV Q 6 hours   CAD status post CABG - Continue aspirin  - No reports of chest pain   Rheumatoid arthritis - Continue Plaquenil and Methotrexate  Chronic diastolic CHF - Compensated.  Chronic bilateral lower extremity ulcers secondary to venous stasis / stage II sacral decubitus ulcer - Evaluated by wound care team   Malnutrition of moderate degree - In the context of chronic illness  - Diet as tolerated with aspiration precautions   Yeast in urine, in the setting of confusion, per chart review patient was prescribed po diflucan but refused oral meds, will treat with iv diflucan  FTT, per family patient has 30-50pounds weight loss in the last 71months.  DVT Prophylaxis  - Lovenox subQ   Code Status: DNR/DNI , no feeding tube Family Communication:  Family meeting 1/13 with son and daughter from 1pm to 2pm Disposition Plan: SW assisting discharge planning   IV access:  Peripheral IV  Procedures and diagnostic studies:     Dg Chest Port 1 View 11/28/2015  1. No acute cardiopulmonary findings. 2. Hyperinflated lungs with chronic bronchitic markings. 3. Posterior RIGHT rib fractures  with nonunion. No change from CT 11/17/2015.  Dg Chest Portable 1 View 11/23/2015  No acute  cardiopulmonary abnormality seen. Electronically Signed   By: Lupita Raider, M.D.   On: 11/23/2015 09:26   Mr Attempted Valora Corporal Report 11/27/2015  This examination belongs to an outside facility and is stored here for comparison purposes only.  Contact the originating outside institution for any associated report or interpretation.  Medical Consultants:  Psychiatry Neurology Palliative care team  IAnti-Infectives:   None    Cephas Revard, MD PhD [facility] Pager [phone]  Time spent in minutes: 25 minutes  If 7PM-7AM, please contact night-coverage [email or web address] Password [email or web address] 12/08/2015, 9:04 PM   LOS: 14 days    HPI/Subjective: No meaningful improvement, Hallucinating, talking incoherently, not eating, not taking meds. daughter and grandson in room  Objective: Filed Vitals:   12/08/15 0630 12/08/15 1300 12/08/15 1809 12/08/15 1937  BP: 106/47 115/44 123/104 123/54  Pulse:  78 88 96  Temp: 98.2 F (36.8 C) 98 F (36.7 C) 97.5 F (36.4 C) 98 F (36.7 C)  TempSrc: Axillary Axillary Axillary Axillary  Resp: 24 30 46 39  Height:      Weight:      SpO2: 97% 100% 94% 93%    Intake/Output Summary (Last 24 hours) at 12/08/15 2104 Last data filed at 12/08/15 1925  Gross per 24 hour  Intake      0 ml  Output    975 ml  Net   -975 ml    Exam:   General:  Pt is incoherently mumbling, frail  Cardiovascular: Rate controlled, appreciate S1, S2   Respiratory: No wheezing, no crackles, no rhonchi  Abdomen: (+) BS, non tender   Extremities: No swelling, palpable pulses   Neuro: Nonfocal  Data Reviewed: Basic Metabolic Panel:  Recent Labs Lab 12/03/15 0540 12/06/15 0540 12/06/15 1511 12/07/15 0534 12/08/15 0521  NA 141 150* 152* 147* 142  K 3.7 3.6 3.8 3.7 4.2  CL 104 105 107 105 99*  CO2 30 31 32 34* 34*  GLUCOSE 126* 96 121* 128* 119*  BUN 6 12 12 10 7   CREATININE 0.49 0.47 0.60 0.50 0.50  CALCIUM 8.6* 8.7* 9.0 8.9 9.0  MG 1.5* 1.9  --  1.8  --     Liver Function Tests:  Recent Labs Lab 12/06/15 0540  AST 19  ALT 11*  ALKPHOS 69  BILITOT 1.1  PROT 5.9*  ALBUMIN 2.3*   No results for input(s): LIPASE, AMYLASE in the last 168 hours. No results for input(s): AMMONIA in the last 168 hours. CBC:  Recent Labs Lab 12/06/15 0540  WBC 6.1  HGB 10.8*  HCT 35.4*  MCV 89.6  PLT 230   Cardiac Enzymes: No results for input(s): CKTOTAL, CKMB, CKMBINDEX, TROPONINI in the last 168 hours. BNP: Invalid input(s): POCBNP CBG: No results for input(s): GLUCAP in the last 168 hours.  No results found for this or any previous visit (from the past 240 hour(s)).   Scheduled Meds: . aspirin EC  81 mg Oral Daily  . cholecalciferol  1,000 Units Oral Daily  . enoxaparin (LOVENOX) injection  40 mg Subcutaneous Q24H  . folic acid  1 mg Oral BID  . hydroxychloroquine  200 mg Oral BID  . LORazepam  1 mg Intravenous QHS  . methotrexate  15 mg Oral Weekly  . metoprolol  2.5 mg Intravenous 4 times per day  . multivitamin  5 mL Oral Daily  . OLANZapine zydis  5  mg Oral Daily  . senna-docusate  1 tablet Oral BID  . simvastatin  40 mg Oral Daily

## 2015-12-08 NOTE — Clinical Social Work Note (Signed)
CSW consulted with Dr. Linna Darner with palliative care and attending, Dr. Roda Shutters. Pt's adult children are now considering a hospice placement VS SNF. Placement is still undecided. CSW will continue to follow for d/c needs.   Etta Quill, LCSW [phone] Hospital psychiatric & 5E, 5W 31-35 Licensed Clinical Social Worker

## 2015-12-09 MED ORDER — ALBUTEROL SULFATE (2.5 MG/3ML) 0.083% IN NEBU: 2.5000 mg | INHALATION_SOLUTION | RESPIRATORY_TRACT | Status: AC | PRN

## 2015-12-09 MED ORDER — LORAZEPAM 2 MG/ML PO CONC: 0.5000 mg | Freq: Three times a day (TID) | ORAL | Status: AC | PRN

## 2015-12-09 MED ORDER — MORPHINE SULFATE (CONCENTRATE) 10 MG /0.5 ML PO SOLN: 5.0000 mg | Freq: Four times a day (QID) | ORAL | Status: AC | PRN

## 2015-12-09 MED ORDER — OLANZAPINE 5 MG PO TBDP: 5.0000 mg | ORAL_TABLET | Freq: Every day | ORAL | Status: AC

## 2015-12-09 NOTE — Progress Notes (Signed)
CSW spoke with son and advised him of Dr Warren Danes assessment and recommendations-  Son feels "she is not getting a fair shake at getting better". He is optimistic that she may still have a chance to recover. CSW listened; validated his feelings and he is now agreeable to referral to [facility] for residential care. CSW will make referral this morning and advise.   Reece Levy, MSW, [facility]  [phone]

## 2015-12-09 NOTE — Progress Notes (Signed)
CSW left message for son to discuss and determine disposition- per Dr Roda Shutters patient is in need of hospice care- patient has [facility] which would require SNF auth for rehab which is not appropriate for patient at this time (EOL care per MD). Await conversation with family-  Reece Levy, MSW, [place]  [phone]

## 2015-12-09 NOTE — Progress Notes (Signed)
Patient ID: [name], female   DOB: [birth year], 82 y.o.   MRN: [id number] [facility] PROGRESS NOTE  MAYTEE LINENBERGER URK:[id number] DOB: [birth year] DOA: 11/23/2015 PCP: Rogelia Boga, MD  Brief narrative:    82 y.o. female with past medical history of ASCVD, CABG, AAA repair, rheumatoid arthritis, chronic low back pain, chronic diastolic CHF, chronic lower extremity venous stasis ulcers, HTN, recent hospitalization 11/13/15-11/19/15 for acute encephalopathy, sepsis secondary to UTI, she was discharged to SNF and then returned to the [facility] ED on 11/23/15 with complaints of worsening confusion/combativeness and decreased oral intake. She was admitted for acute encephalopathy and dehydration with hypernatremia. Hypernatremia and electrolyte abnormalities have resolved but pt continued to be confused and agitated.  Psychiatry was consulted and patient was started on IM Zyprexa (refuses to consistently take oral medications). Recent extensive workup for altered mental status has been negative (negative RPR, TSH, B12). Neurology was consulted but patient unable to cooperate for MRI brain or EEG.  Palliative consulted  for goals of care.  Barrier to discharge: Awaiting bed placement to SNF. She is from [facility] but no beds available there.   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  - Electrolytes corrected but pt continues to have altered mental status - Haldol not effective so now on IM Zyprexa per psych recommendations - TSH, B12, RPR recently unremarkable. Neurology was consulted but patient could not cooperate for MRI brain or EEG. - Palliative care also on board. - SW assisting discharge planning  -not improving, no eating,  family meeting on 1/13, no feeding tube, likely hospice  Active  Problems: Recurrent hypernatremia - Dehydration likely contributing to hypernatremia - na 150 on 1/12, start d5, need continued discussion with family in the setting of persistent oral intake, family has reported 30-50pounds weight loss in the last 8 months. This is likely progressive and subacute to chronic in nature. -goal of care discussion ongoing, family is having hard time facing the fact that patient is declining, now agreed to hospice placement  Hypokalemia - Supplemented and WNL  Dysphagia - Currently on full liquid diet.  - Continue aspiration precautions.   Dyslipidemia - Continue simvastatin   Acute respiratory failure with hypoxia - Chest x-ray without acute abnormalities.  - Continue oxygen support via [place] to keep O2 sats above 90%  Essential hypertension - Continue metoprolol 5 mg IV Q 6 hours   CAD status post CABG - Continue aspirin  - No reports of chest pain   Rheumatoid arthritis - Continue Plaquenil and Methotrexate  Chronic diastolic CHF - Compensated.  Chronic bilateral lower extremity ulcers secondary to venous stasis / stage II sacral decubitus ulcer - Evaluated by wound care team   Malnutrition of moderate degree - In the context of chronic illness  - Diet as tolerated with aspiration precautions   Yeast in urine, in the setting of confusion, per chart review patient was prescribed po diflucan but refused oral meds, finished iv diflucanx5days  FTT, with history of multiple falls, per family patient has 30-50pounds weight loss in the last 70months.  DVT Prophylaxis  - Lovenox subQ   Code Status: DNR/DNI , no feeding tube Family Communication:  Family meeting 1/13 with son and daughter from 1pm to 2pm Disposition Plan: SW assisting discharge planning , discharge to hospice facility when bed awailable  IV access:  Peripheral IV  Procedures and diagnostic studies:  Dg Chest Port 1 View 11/28/2015  1. No acute cardiopulmonary findings. 2.  Hyperinflated lungs with chronic bronchitic markings. 3. Posterior RIGHT rib fractures with nonunion. No change from CT 11/17/2015.  Dg Chest Portable 1 View 11/23/2015  No acute cardiopulmonary abnormality seen. Electronically Signed   By: Lupita Raider, M.D.   On: 11/23/2015 09:26   Mr Attempted Valora Corporal Report 11/27/2015  This examination belongs to an outside facility and is stored here for comparison purposes only.  Contact the originating outside institution for any associated report or interpretation.  Medical Consultants:  Psychiatry Neurology Palliative care team  IAnti-Infectives:   None    Tahmir Kleckner, MD PhD [facility] Pager [phone]  Time spent in minutes: 25 minutes  If 7PM-7AM, please contact night-coverage [email or web address] Password [facility] 12/09/2015, 11:56 AM   LOS: 15 days    HPI/Subjective: No meaningful improvement, Hallucinating, talking incoherently, not eating, not taking meds. Now family agrees to hospice facility placement.  Objective: Filed Vitals:   12/09/15 0506 12/09/15 0601 12/09/15 0759 12/09/15 1151  BP:  141/69 137/48 141/49  Pulse:  53 92 98  Temp:  98.6 F (37 C) 98.4 F (36.9 C) 97.5 F (36.4 C)  TempSrc:  Oral Axillary Axillary  Resp:  18 18 21   Height:      Weight: 52.38 kg (115 lb 7.6 oz)     SpO2:  97% 98% 96%    Intake/Output Summary (Last 24 hours) at 12/09/15 1156 Last data filed at 12/09/15 0846  Gross per 24 hour  Intake     25 ml  Output    925 ml  Net   -900 ml    Exam:   General:  Pt is incoherently mumbling, frail  Cardiovascular: Rate controlled, appreciate S1, S2   Respiratory: No wheezing, no crackles, no rhonchi  Abdomen: (+) BS, non tender   Extremities: No swelling, palpable pulses   Neuro: Nonfocal  Data Reviewed: Basic Metabolic Panel:  Recent Labs Lab 12/03/15 0540 12/06/15 0540 12/06/15 1511 12/07/15 0534 12/08/15 0521  NA 141 150* 152* 147* 142  K 3.7 3.6 3.8 3.7 4.2  CL 104 105  107 105 99*  CO2 30 31 32 34* 34*  GLUCOSE 126* 96 121* 128* 119*  BUN 6 12 12 10 7   CREATININE 0.49 0.47 0.60 0.50 0.50  CALCIUM 8.6* 8.7* 9.0 8.9 9.0  MG 1.5* 1.9  --  1.8  --    Liver Function Tests:  Recent Labs Lab 12/06/15 0540  AST 19  ALT 11*  ALKPHOS 69  BILITOT 1.1  PROT 5.9*  ALBUMIN 2.3*   No results for input(s): LIPASE, AMYLASE in the last 168 hours. No results for input(s): AMMONIA in the last 168 hours. CBC:  Recent Labs Lab 12/06/15 0540  WBC 6.1  HGB 10.8*  HCT 35.4*  MCV 89.6  PLT 230   Cardiac Enzymes: No results for input(s): CKTOTAL, CKMB, CKMBINDEX, TROPONINI in the last 168 hours. BNP: Invalid input(s): POCBNP CBG: No results for input(s): GLUCAP in the last 168 hours.  No results found for this or any previous visit (from the past 240 hour(s)).   Scheduled Meds: . aspirin EC  81 mg Oral Daily  . cholecalciferol  1,000 Units Oral Daily  . enoxaparin (LOVENOX) injection  40 mg Subcutaneous Q24H  . folic acid  1 mg Oral BID  . hydroxychloroquine  200 mg Oral BID  . LORazepam  1 mg Intravenous QHS  . methotrexate  15 mg  Oral Weekly  . metoprolol  2.5 mg Intravenous 4 times per day  . multivitamin  5 mL Oral Daily  . OLANZapine zydis  5 mg Oral Daily  . senna-docusate  1 tablet Oral BID  . simvastatin  40 mg Oral Daily

## 2015-12-10 DIAGNOSIS — L8990 Pressure ulcer of unspecified site, unspecified stage: Secondary | ICD-10-CM | POA: Insufficient documentation

## 2015-12-10 MED ORDER — NYSTATIN 100000 UNIT/ML MT SUSP
5.0000 mL | Freq: Four times a day (QID) | OROMUCOSAL | Status: DC
  Administered 2015-12-10: 500000 [IU] via ORAL
  Filled 2015-12-10 (×3): qty 5

## 2015-12-10 MED ORDER — NYSTATIN 100000 UNIT/ML MT SUSP: 5.0000 mL | Freq: Four times a day (QID) | OROMUCOSAL | Status: AC

## 2015-12-10 MED ORDER — FLUCONAZOLE 100 MG PO TABS: 100.0000 mg | ORAL_TABLET | Freq: Every day | ORAL | Status: AC

## 2015-12-10 NOTE — Care Management Important Message (Signed)
Important Message  Patient Details  Name: KALANY DIEKMANN MRN: [id number] Date of Birth: 1932/02/06   Medicare Important Message Given:  Yes    Haskell Flirt 12/10/2015, 11:10 AMImportant Message  Patient Details  Name: EVANA RUNNELS MRN: [id number] Date of Birth: [birth year]   Medicare Important Message Given:  Yes    Haskell Flirt 12/10/2015, 11:10 AM

## 2015-12-10 NOTE — Progress Notes (Signed)
PT Cancellation Note  Patient Details Name: [name] MRN: [id number] DOB: 17-Jun-1932   Cancelled Treatment:    Reason Eval/Treat Not Completed: PT screened, no needs identified, will sign off. Note pt to d/c to residential hospice/comfort care. Will sign off.    Rebeca Alert, MPT Pager: [phone]

## 2015-12-10 NOTE — Progress Notes (Signed)
CSW made referral to Lafonda Mosses at [facility] - awaiting response re: bed availability/eligibility.    Lincoln Maxin, LCSW [facility] Clinical Social Worker cell #: [phone]

## 2015-12-10 NOTE — Progress Notes (Signed)
[name] to be D/C'd Skilled nursing facility, [facility] per MD order.  Discussed prescriptions and follow up appointments with the patient. Prescriptions given to patient, medication list explained in detail. Pt verbalized understanding.    Medication List    STOP taking these medications        ATIVAN IJ  Replaced by:  LORazepam 2 MG/ML concentrated solution     ciprofloxacin 500 MG tablet  Commonly known as:  CIPRO     furosemide 40 MG tablet  Commonly known as:  LASIX     hydrochlorothiazide 25 MG tablet  Commonly known as:  HYDRODIURIL     HYDROcodone-acetaminophen 5-325 MG tablet  Commonly known as:  NORCO/VICODIN      TAKE these medications        acetaminophen 500 MG tablet  Commonly known as:  TYLENOL  Take 500 mg by mouth every 6 (six) hours as needed (for pain.).     albuterol (2.5 MG/3ML) 0.083% nebulizer solution  Commonly known as:  PROVENTIL  Take 3 mLs (2.5 mg total) by nebulization every 2 (two) hours as needed for wheezing.     aspirin EC 81 MG tablet  Take 81 mg by mouth at bedtime.     cetirizine 10 MG tablet  Commonly known as:  ZYRTEC  Take 10 mg by mouth daily as needed for allergies.     cholecalciferol 1000 units tablet  Commonly known as:  VITAMIN D  Take 1,000 Units by mouth daily.     COMPLETE MULTI-VITAMIN PO  Take 1 tablet by mouth daily.     DENTA 5000 PLUS 1.1 % Crea dental cream  Generic drug:  sodium fluoride  BRUSH 3 TIMES A DAY     fluconazole 100 MG tablet  Commonly known as:  DIFLUCAN  Take 1 tablet (100 mg total) by mouth daily.     folic acid 1 MG tablet  Commonly known as:  FOLVITE  Take 1 mg by mouth 2 (two) times daily.     hydroxychloroquine 200 MG tablet  Commonly known as:  PLAQUENIL  Take 200 mg by mouth 2 (two) times daily.     LORazepam 2 MG/ML concentrated solution  Commonly known as:  ATIVAN  Place 0.3 mLs (0.6 mg total) under the tongue every 8 (eight) hours as needed for anxiety.      methotrexate 2.5 MG tablet  Commonly known as:  RHEUMATREX  Take 15 mg by mouth once a week. Caution:Chemotherapy. Protect from light. Take on Thursday.     metoprolol succinate 50 MG 24 hr tablet  Commonly known as:  TOPROL-XL  TAKE 1 TABLET BY MOUTH EVERY DAY     morphine CONCENTRATE 10 mg / 0.5 ml concentrated solution  Place 0.25 mLs (5 mg total) under the tongue every 6 (six) hours as needed for severe pain.     NASONEX 50 MCG/ACT nasal spray  Generic drug:  mometasone  Place 2 sprays into the nose daily as needed (allergies).     nystatin 100000 UNIT/ML suspension  Commonly known as:  MYCOSTATIN  Take 5 mLs (500,000 Units total) by mouth 4 (four) times daily.     OLANZapine zydis 5 MG disintegrating tablet  Commonly known as:  ZYPREXA  Take 1 tablet (5 mg total) by mouth daily.     potassium chloride 8 MEQ tablet  Commonly known as:  KLOR-CON  Take 8 mEq by mouth 3 (three) times daily.     senna-docusate 8.6-50 MG  tablet  Commonly known as:  Senokot-S  Take 1 tablet by mouth 2 (two) times daily.     simvastatin 40 MG tablet  Commonly known as:  ZOCOR  TAKE 1 TABLET BY MOUTH AT BEDTIME        Filed Vitals:   12/10/15 1000 12/10/15 1454  BP: 127/54 157/69  Pulse: 105 110  Temp: 97.9 F (36.6 C) 97.8 F (36.6 C)  Resp: 23 21    Skin clean, dry and intact without evidence of skin break down, no evidence of skin tears noted. IV catheter discontinued intact. Site without signs and symptoms of complications. Dressing and pressure applied. Pt denies pain at this time. No complaints noted.  An After Visit Summary was printed and given to the patient. Patient escorted via WC, and D/C home via private auto.  Rondel Jumbo 12/10/2015 3:25 PM

## 2015-12-10 NOTE — Progress Notes (Signed)
OT Cancellation Note  Patient Details Name: [name] MRN: [id number] DOB: 1931-11-26   Cancelled Treatment:    Reason Eval/Treat Not Completed: Other (comment) (Note pt to d/c to residential hospice/comfort care. Will sign off.)  [name] A 12/10/2015, 3:05 PM

## 2015-12-10 NOTE — Progress Notes (Signed)
Chaplain support with pt's son around pt transfer to [facility] hospice.  Engaged in brief narrative review and provided pastoral presence and grief support around progression of illness.       Belva Crome MDiv

## 2015-12-10 NOTE — Discharge Summary (Signed)
Discharge Summary  [name] JWJ:[id number] DOB: [birth year]  PCP: Rogelia Boga, MD  Admit date: 11/23/2015 Discharge date: 12/10/2015  Time spent: >73mins  Recommendations for Outpatient Follow-up:  1. Patient is discharged to hospice facility, follow up with pmd prn  Discharge Diagnoses:  Active Hospital Problems   Diagnosis Date Noted  . Acute encephalopathy 11/14/2015  . Pressure ulcer 12/10/2015  . Cognitive decline   . Encounter for palliative care   . Goals of care, counseling/discussion   . Dehydration   . FTT (failure to thrive) in adult   . Hypokalemia 12/04/2015  . Dyslipidemia 12/04/2015  . Acute respiratory failure with hypoxia (HCC) 12/04/2015  . Paranoid psychosis (HCC) 11/28/2015  . Dehydration with hypernatremia 11/23/2015  . Malnutrition of moderate degree 11/14/2015  . Rheumatoid arthritis (HCC) 11/13/2015  . CORONARY ATHEROSCLEROSIS, ARTERY BYPASS GRAFT 08/23/2008  . Essential hypertension 05/11/2008    Resolved Hospital Problems   Diagnosis Date Noted Date Resolved  No resolved problems to display.    Discharge Condition: stable  Diet recommendation: diet as tolerated  Filed Weights   12/03/15 0445 12/09/15 0506 12/10/15 0606  Weight: 52 kg (114 lb 10.2 oz) 52.38 kg (115 lb 7.6 oz) 50.6 kg (111 lb 8.8 oz)    History of present illness:  [name] is a 82 y.o. female , recent hospitalization 11/13/15-11/19/15 with diagnosis of acute encephalopathy, sepsis secondary to UTI, was discharged to SNF, returns to the [facility] ED on 11/23/15 with complaints of worsening confusion/combativeness and decreased oral intake. She has PMH of ASCVD, CABG, AAA repair, rheumatoid arthritis, chronic low back pain, chronic diastolic CHF, chronic lower extremity venous stasis ulcers, HTN. Patient is confused and not a reliable historian. As per ED staff, patient was spitting at them. As per report, she has been refusing to take her  medications, increasingly combative towards staff which has limited her care at the SNF. SNF thereby sent her to the hospital for IV therapy. In the ED, lab work shows sodium 155, potassium 3.3, chloride 99, bicarbonate 38, BUN 47, creatinine 1, WBC 16.3, chest x-ray without acute abnormality and urine microscopy not indicative of UTI. Hospitalist admission was requested.  Psychiatry was consulted and patient was started on IM Zyprexa (refuses to consistently take oral medications). Recent extensive workup for altered mental status has been negative (negative RPR, TSH, B12). Neurology was consulted but patient unable to cooperate for MRI brain or EEG.  Palliative consulted for goals of care.  Hospital Course:  Principal Problem:   Acute encephalopathy Active Problems:   Essential hypertension   CORONARY ATHEROSCLEROSIS, ARTERY BYPASS GRAFT   Rheumatoid arthritis (HCC)   Malnutrition of moderate degree   Dehydration with hypernatremia   Paranoid psychosis (HCC)   Hypokalemia   Dyslipidemia   Acute respiratory failure with hypoxia (HCC)   Cognitive decline   Encounter for palliative care   Goals of care, counseling/discussion   Dehydration   FTT (failure to thrive) in adult   Pressure ulcer  Paranoid psychosis/likely underlying dementia with behavioral disturbance - Initially thought acute encephalopathy related to dehydration and hypernatremia.  - Electrolytes corrected but pt continues to have altered mental status - Haldol not effective so now on IM Zyprexa per psych recommendations - TSH, B12, RPR recently unremarkable. Neurology was consulted but patient could not cooperate for MRI brain or EEG. - Palliative care also on board. - SW assisting discharge planning  -not improving, no eating, family meeting on 1/13, no feeding tube,  discharge to hospice  Active Problems: Recurrent hypernatremia - Dehydration likely contributing to hypernatremia - na 150 on 1/12, start d5,  need continued discussion with family in the setting of persistent oral intake, family has reported 30-50pounds weight loss in the last 8 months. This is likely progressive and subacute to chronic in nature. - family is having hard time facing the fact that patient is declining, now agreed to hospice placement  Hypokalemia - Supplemented and WNL  Dysphagia - Currently on full liquid diet.  - Continue aspiration precautions.   Dyslipidemia - Continue simvastatin   Acute respiratory failure with hypoxia - Chest x-ray without acute abnormalities.  - Continue oxygen support via [place] to keep O2 sats above 90%  Essential hypertension - Continue metoprolol 5 mg IV Q 6 hours   CAD status post CABG - Continue aspirin  - No reports of chest pain   Rheumatoid arthritis - Continue Plaquenil and Methotrexate  Chronic diastolic CHF - Compensated.  Chronic bilateral lower extremity ulcers secondary to venous stasis / stage II sacral decubitus ulcer - Evaluated by wound care team   Malnutrition of moderate degree - In the context of chronic illness  - Diet as tolerated with aspiration precautions   Yeast in urine, in the setting of confusion, per chart review patient was prescribed po diflucan but refused oral meds, finished iv diflucanx5days  Oral thrush: topical nystatin, oral care, she is treated with iv diflucan in the hospital, oral diflucan prescribed at discharge if patient agrees to take oral meds.  FTT, with history of multiple falls, per family patient has 30-50pounds weight loss in the last 39months. Not improving, not eating, discharge to hospice.   Code Status: DNR/DNI , no feeding tube Family Communication: Family meeting 1/13 with son and daughter from 1pm to 2pm Disposition Plan: discharge to hospice facility on 1/16  Consultations:  Neurology/psychiatry/palliative care  Discharge Exam: BP 127/54 mmHg  Pulse 105  Temp(Src) 97.9 F (36.6 C) (Axillary)  Resp  23  Ht 5\' 2"  (1.575 m)  Wt 50.6 kg (111 lb 8.8 oz)  BMI 20.40 kg/m2  SpO2 95%   General: incoherently mumbling, frail  Cardiovascular: Rate controlled, appreciate S1, S2   Respiratory: No wheezing, no crackles, no rhonchi  Abdomen: (+) BS, non tender   Extremities: No swelling, palpable pulses   Neuro: Nonfocal  Skin: sacral stage II decubitus ulcer, presented on admission   Discharge Instructions You were cared for by a hospitalist during your hospital stay. If you have any questions about your discharge medications or the care you received while you were in the hospital after you are discharged, you can call the unit and asked to speak with the hospitalist on call if the hospitalist that took care of you is not available. Once you are discharged, your primary care physician will handle any further medical issues. Please note that NO REFILLS for any discharge medications will be authorized once you are discharged, as it is imperative that you return to your primary care physician (or establish a relationship with a primary care physician if you do not have one) for your aftercare needs so that they can reassess your need for medications and monitor your lab values.  Discharge Instructions    Diet - low sodium heart healthy    Complete by:  As directed      Increase activity slowly    Complete by:  As directed  Medication List    STOP taking these medications        ATIVAN IJ  Replaced by:  LORazepam 2 MG/ML concentrated solution     ciprofloxacin 500 MG tablet  Commonly known as:  CIPRO     furosemide 40 MG tablet  Commonly known as:  LASIX     hydrochlorothiazide 25 MG tablet  Commonly known as:  HYDRODIURIL     HYDROcodone-acetaminophen 5-325 MG tablet  Commonly known as:  NORCO/VICODIN      TAKE these medications        acetaminophen 500 MG tablet  Commonly known as:  TYLENOL  Take 500 mg by mouth every 6 (six) hours as needed (for pain.).       albuterol (2.5 MG/3ML) 0.083% nebulizer solution  Commonly known as:  PROVENTIL  Take 3 mLs (2.5 mg total) by nebulization every 2 (two) hours as needed for wheezing.     aspirin EC 81 MG tablet  Take 81 mg by mouth at bedtime.     cetirizine 10 MG tablet  Commonly known as:  ZYRTEC  Take 10 mg by mouth daily as needed for allergies.     cholecalciferol 1000 units tablet  Commonly known as:  VITAMIN D  Take 1,000 Units by mouth daily.     COMPLETE MULTI-VITAMIN PO  Take 1 tablet by mouth daily.     DENTA 5000 PLUS 1.1 % Crea dental cream  Generic drug:  sodium fluoride  BRUSH 3 TIMES A DAY     fluconazole 100 MG tablet  Commonly known as:  DIFLUCAN  Take 1 tablet (100 mg total) by mouth daily.     folic acid 1 MG tablet  Commonly known as:  FOLVITE  Take 1 mg by mouth 2 (two) times daily.     hydroxychloroquine 200 MG tablet  Commonly known as:  PLAQUENIL  Take 200 mg by mouth 2 (two) times daily.     LORazepam 2 MG/ML concentrated solution  Commonly known as:  ATIVAN  Place 0.3 mLs (0.6 mg total) under the tongue every 8 (eight) hours as needed for anxiety.     methotrexate 2.5 MG tablet  Commonly known as:  RHEUMATREX  Take 15 mg by mouth once a week. Caution:Chemotherapy. Protect from light. Take on Thursday.     metoprolol succinate 50 MG 24 hr tablet  Commonly known as:  TOPROL-XL  TAKE 1 TABLET BY MOUTH EVERY DAY     morphine CONCENTRATE 10 mg / 0.5 ml concentrated solution  Place 0.25 mLs (5 mg total) under the tongue every 6 (six) hours as needed for severe pain.     NASONEX 50 MCG/ACT nasal spray  Generic drug:  mometasone  Place 2 sprays into the nose daily as needed (allergies).     nystatin 100000 UNIT/ML suspension  Commonly known as:  MYCOSTATIN  Take 5 mLs (500,000 Units total) by mouth 4 (four) times daily.     OLANZapine zydis 5 MG disintegrating tablet  Commonly known as:  ZYPREXA  Take 1 tablet (5 mg total) by mouth daily.      potassium chloride 8 MEQ tablet  Commonly known as:  KLOR-CON  Take 8 mEq by mouth 3 (three) times daily.     senna-docusate 8.6-50 MG tablet  Commonly known as:  Senokot-S  Take 1 tablet by mouth 2 (two) times daily.     simvastatin 40 MG tablet  Commonly known as:  ZOCOR  TAKE 1 TABLET BY MOUTH  AT BEDTIME       Allergies  Allergen Reactions  . Amoxicillin     REACTION: unspecified  . Cyclobenzaprine Hcl     REACTION: rash  . Mupirocin     Unknown reaction  . Penicillins Hives  . Tetanus Toxoid Hives      The results of significant diagnostics from this hospitalization (including imaging, microbiology, ancillary and laboratory) are listed below for reference.    Significant Diagnostic Studies: Dg Chest 2 View  11/13/2015  CLINICAL DATA:  Hypertension.  Confusion and agitation. EXAM: CHEST  2 VIEW COMPARISON:  July 04, 2013 FINDINGS: Areas no edema or consolidation. Heart is mildly enlarged with pulmonary vascularity within normal limits. Patient is status post internal mammary bypass grafting. Aorta is tortuous. No adenopathy. No pneumothorax. There are healing fractures of the right posterior sixth and seventh ribs. No acute fracture is apparent. IMPRESSION: Healing fractures of the right posterior sixth and seventh ribs. No pneumothorax. No edema or consolidation. Stable cardiac prominence and aortic tortuosity. Electronically Signed   By: Bretta Bang III M.D.   On: 11/13/2015 18:59   Ct Head Wo Contrast  11/13/2015  CLINICAL DATA:  Altered mental status.  Recent falls EXAM: CT HEAD WITHOUT CONTRAST CT CERVICAL SPINE WITHOUT CONTRAST TECHNIQUE: Multidetector CT imaging of the head and cervical spine was performed following the standard protocol without intravenous contrast. Multiplanar CT image reconstructions of the cervical spine were also generated. COMPARISON:  CT cervical spine December 27, 2014; cervical MRI May 08, 2015; head CT March 18, 2015 FINDINGS: CT HEAD  FINDINGS There is age related volume loss. There is no intracranial mass, hemorrhage, extra-axial fluid collection, or midline shift. There is slight small vessel disease in the centra semiovale bilaterally. There is no new gray-white compartment lesion. No acute infarct evident. Bony calvarium appears intact. The mastoid air cells are clear. No intraorbital lesions are identified. There is rightward deviation of the nasal septum. There is a slight right frontal scalp hematoma. CT CERVICAL SPINE FINDINGS A degree of patient motion makes this study less than optimal. The patient is status post anterior screw and plate fixation from C4-C6, stable. The screw and plate fixation device appears intact as do disc spacers at C4-5 and C5-6. There is no demonstrable acute fracture. There is slight anterolisthesis of C3 on C4, likely due to spondylosis. The prevertebral soft tissues and predental space regions are normal. There is moderately severe disc space narrowing at all levels. There is facet hypertrophy at multiple levels, tending to be more severe on the right than on the left. There is no frank disc extrusion or stenosis. There is exit foraminal narrowing at multiple levels due to bony hypertrophy. IMPRESSION: CT head: Age related volume loss with mild periventricular small vessel disease. No intracranial mass, hemorrhage, or extra-axial fluid collection. No evidence of acute infarct. Small right frontal scalp hematoma. CT cervical spine: Postoperative change from C4-C6. No fracture. Slight spondylolisthesis at C3-4, felt to be due to underlying spondylosis. No other spondylolisthesis. Multilevel arthropathy. Note that a degree of motion artifact makes this study somewhat less than optimal. Electronically Signed   By: Bretta Bang III M.D.   On: 11/13/2015 18:43   Ct Chest Wo Contrast  11/17/2015  CLINICAL DATA:  Severe shortness of breath. Acute illness/UTI/sepsis. Evaluate for possible drug reaction. EXAM:  CT CHEST WITHOUT CONTRAST TECHNIQUE: Multidetector CT imaging of the chest was performed following the standard protocol without IV contrast. COMPARISON:  Radiographs 11/16/2015 and 11/13/2015.  CT 03/12/2011. FINDINGS: Mediastinum/Nodes: There are no enlarged mediastinal, hilar or axillary lymph nodes.Hilar assessment is limited by motion and the lack of intravenous contrast, although the hilar contours are unchanged. The thyroid gland, trachea and esophagus demonstrate no significant findings. The heart size is stable status post median sternotomy, CABG and ascending aortic aneurysm repair. Dilatation of the posterior aortic arch to 4.4 cm has slightly progressed. Lungs/Pleura: There is mild dependent pleural thickening in both lungs, but no significant pleural effusion. Dependent atelectasis at both lung bases has mildly progressed. Otherwise, there is stable mild chronic lung disease with biapical scarring and scattered mild subpleural reticulation. There is no confluent airspace opacity, interstitial lung disease or suspicious pulmonary nodule. Upper abdomen: The liver demonstrates low density consistent with steatosis. No acute findings identified within the visualized upper abdomen. Musculoskeletal/Chest wall: Osseous evaluation limited by motion. There is a mild convex right thoracic scoliosis. Asymmetric glenohumeral degenerative changes are present on the left. Old rib fractures or thoracotomy defects are noted bilaterally. No definite acute osseous findings seen. IMPRESSION: 1. No specific evidence of drug reaction or acute infection in the lungs. 2. There is mildly increased dependent atelectasis and pleural thickening in both lungs. 3. Cardiomegaly with diffuse atherosclerosis status post CABG and aneurysm repair. Dilatation of the aortic arch posteriorly has mildly progressed. Electronically Signed   By: Carey Bullocks M.D.   On: 11/17/2015 20:02   Ct Cervical Spine Wo Contrast  11/13/2015   CLINICAL DATA:  Altered mental status.  Recent falls EXAM: CT HEAD WITHOUT CONTRAST CT CERVICAL SPINE WITHOUT CONTRAST TECHNIQUE: Multidetector CT imaging of the head and cervical spine was performed following the standard protocol without intravenous contrast. Multiplanar CT image reconstructions of the cervical spine were also generated. COMPARISON:  CT cervical spine December 27, 2014; cervical MRI May 08, 2015; head CT March 18, 2015 FINDINGS: CT HEAD FINDINGS There is age related volume loss. There is no intracranial mass, hemorrhage, extra-axial fluid collection, or midline shift. There is slight small vessel disease in the centra semiovale bilaterally. There is no new gray-white compartment lesion. No acute infarct evident. Bony calvarium appears intact. The mastoid air cells are clear. No intraorbital lesions are identified. There is rightward deviation of the nasal septum. There is a slight right frontal scalp hematoma. CT CERVICAL SPINE FINDINGS A degree of patient motion makes this study less than optimal. The patient is status post anterior screw and plate fixation from C4-C6, stable. The screw and plate fixation device appears intact as do disc spacers at C4-5 and C5-6. There is no demonstrable acute fracture. There is slight anterolisthesis of C3 on C4, likely due to spondylosis. The prevertebral soft tissues and predental space regions are normal. There is moderately severe disc space narrowing at all levels. There is facet hypertrophy at multiple levels, tending to be more severe on the right than on the left. There is no frank disc extrusion or stenosis. There is exit foraminal narrowing at multiple levels due to bony hypertrophy. IMPRESSION: CT head: Age related volume loss with mild periventricular small vessel disease. No intracranial mass, hemorrhage, or extra-axial fluid collection. No evidence of acute infarct. Small right frontal scalp hematoma. CT cervical spine: Postoperative change from  C4-C6. No fracture. Slight spondylolisthesis at C3-4, felt to be due to underlying spondylosis. No other spondylolisthesis. Multilevel arthropathy. Note that a degree of motion artifact makes this study somewhat less than optimal. Electronically Signed   By: Bretta Bang III M.D.   On: 11/13/2015 18:43  Dg Chest Port 1 View  12/08/2015  CLINICAL DATA:  Tachypnea EXAM: PORTABLE CHEST 1 VIEW COMPARISON:  November 28, 2015 FINDINGS: There is patchy infiltrate in the right base. There is mild left base atelectasis. The lungs elsewhere clear. Heart is upper normal in size with pulmonary vascularity within normal limits. Patient is status post internal mammary bypass grafting. There are prior rib fractures on both sides. No pneumothorax. There is postoperative change in the lower cervical region. There is acromioclavicular separation on the right, likely chronic. IMPRESSION: Patchy infiltrate right base. Left base atelectasis. No change in cardiac silhouette. Electronically Signed   By: Bretta Bang III M.D.   On: 12/08/2015 15:53   Dg Chest Port 1 View  11/28/2015  CLINICAL DATA:  Combative.  No history obtainable EXAM: PORTABLE CHEST 1 VIEW COMPARISON:  12/30 /16 FINDINGS: Sternotomy wires overlie normal cardiac silhouette. Lungs are hyperinflated. Chronic bronchitic markings. There is remote posterior RIGHT rib fractures with nonunion. No pneumothorax. Healed rib fracture also noted on the LEFT IMPRESSION: 1. No acute cardiopulmonary findings. 2. Hyperinflated lungs with chronic bronchitic markings. 3. Posterior RIGHT rib fractures with nonunion. No change from CT 11/17/2015. Electronically Signed   By: Genevive Bi M.D.   On: 11/28/2015 17:10   Dg Chest Portable 1 View  11/23/2015  CLINICAL DATA:  Weakness. EXAM: PORTABLE CHEST 1 VIEW COMPARISON:  November 13, 2015. FINDINGS: Stable cardiomediastinal silhouette. Sternotomy wires are noted. No pneumothorax or pleural effusion is noted. Old right  rib fractures are again noted. No acute pulmonary disease is noted. IMPRESSION: No acute cardiopulmonary abnormality seen. Electronically Signed   By: Lupita Raider, M.D.   On: 11/23/2015 09:26   Dg Abd Acute W/chest  11/16/2015  CLINICAL DATA:  Onset of vomiting today, history of asthma and pneumonia and CABG. EXAM: DG ABDOMEN ACUTE W/ 1V CHEST COMPARISON:  PA and lateral chest x-ray of November 13, 2015 FINDINGS: The lungs are adequately inflated. The cardiac silhouette is enlarged but stable. The pulmonary vascularity is not engorged. Hazy increased density in the left mid and upper lung is felt reflect the confluence of the body of the scapula with pre-existing underlying parenchymal densities. There is are healing rib fractures on the right. The patient has undergone previous CABG and there are 7 intact sternal wires. Within the abdomen the bowel gas pattern is nonspecific. The colonic stool burden is moderately increased. There is no small or large bowel obstructive pattern. No free extraluminal gas collections are observed. There is moderate to severe degenerative change at the L4-5 disc level. There are mild degenerative changes of the hips. IMPRESSION: 1. No acute cardiopulmonary abnormality. 2. No acute intra-abdominal abnormality demonstrated. Moderately increased colonic stool burden may reflect constipation in the appropriate clinical setting. Electronically Signed   By: David  [place] M.D.   On: 11/16/2015 13:32   Mr Attempted Valora Corporal Report  11/27/2015  This examination belongs to an outside facility and is stored here for comparison purposes only.  Contact the originating outside institution for any associated report or interpretation.   Microbiology: No results found for this or any previous visit (from the past 240 hour(s)).   Labs: Basic Metabolic Panel:  Recent Labs Lab 12/06/15 0540 12/06/15 1511 12/07/15 0534 12/08/15 0521  NA 150* 152* 147* 142  K 3.6 3.8 3.7 4.2  CL  105 107 105 99*  CO2 31 32 34* 34*  GLUCOSE 96 121* 128* 119*  BUN 12 12 10 7   CREATININE 0.47 0.60  0.50 0.50  CALCIUM 8.7* 9.0 8.9 9.0  MG 1.9  --  1.8  --    Liver Function Tests:  Recent Labs Lab 12/06/15 0540  AST 19  ALT 11*  ALKPHOS 69  BILITOT 1.1  PROT 5.9*  ALBUMIN 2.3*   No results for input(s): LIPASE, AMYLASE in the last 168 hours. No results for input(s): AMMONIA in the last 168 hours. CBC:  Recent Labs Lab 12/06/15 0540  WBC 6.1  HGB 10.8*  HCT 35.4*  MCV 89.6  PLT 230   Cardiac Enzymes: No results for input(s): CKTOTAL, CKMB, CKMBINDEX, TROPONINI in the last 168 hours. BNP: BNP (last 3 results)  Recent Labs  11/13/15 2030  BNP 683.3*    ProBNP (last 3 results) No results for input(s): PROBNP in the last 8760 hours.  CBG: No results for input(s): GLUCAP in the last 168 hours.     SignedAlbertine Grates MD, PhD  [facility] 12/10/2015, 2:03 PM

## 2015-12-26 DEATH — deceased

## 2016-03-21 ENCOUNTER — Ambulatory Visit: Payer: Medicare Other | Admitting: Neurology

## 2017-03-15 IMAGING — CT CT CHEST W/O CM
2 of 4 series · 15 of 36 positions shown, 18 images · non-contrast
Comparison: Radiographs 11/16/2015 and 11/13/2015.  CT 03/12/2011.

CLINICAL DATA: Severe shortness of breath. Acute
illness/UTI/sepsis. Evaluate for possible drug reaction.

EXAM:
CT CHEST WITHOUT CONTRAST
TECHNIQUE: Multidetector CT imaging of the chest was performed following the
standard protocol without IV contrast.

[Series 2: chest w/o st · axial · non-contrast · 0.61mm/px · z∈[-283,-23]mm · 12 of 60 slices shown, 15 images]
[im 4/60  mediastinal]
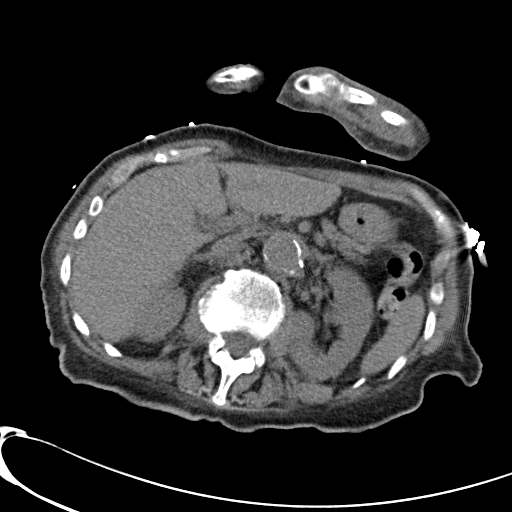
[im 4/60  lung]
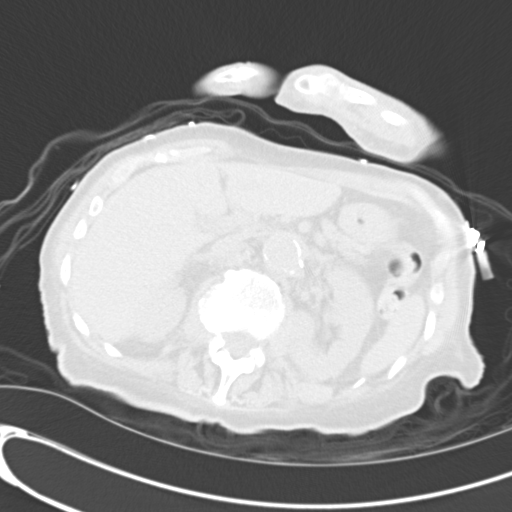
[im 8/60  lung]
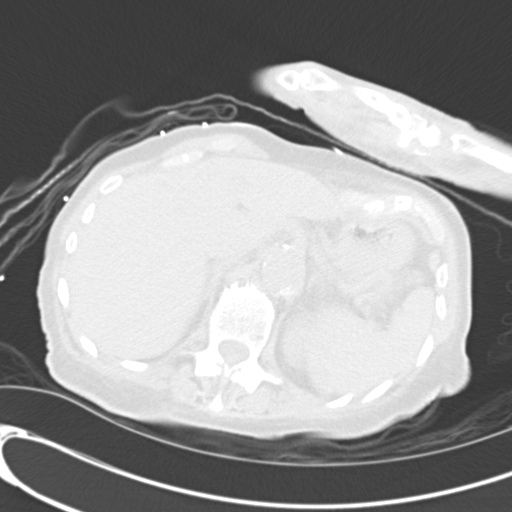
[im 12/60  lung]
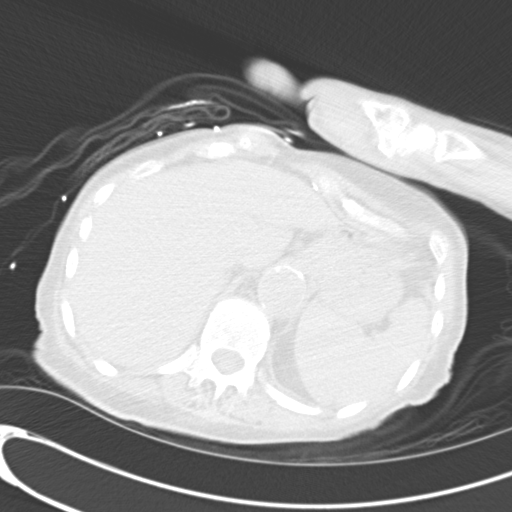
[im 20/60  lung]
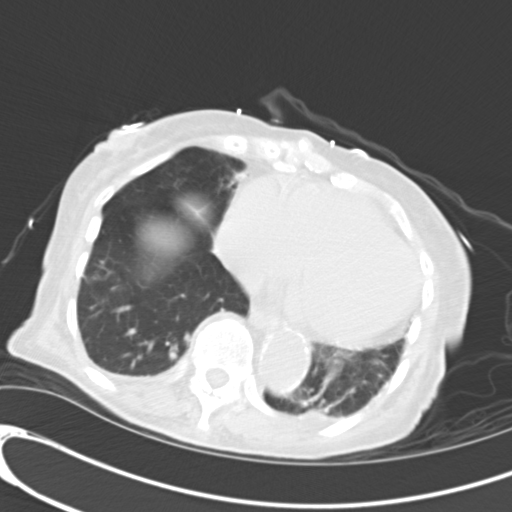
[im 24/60  mediastinal]
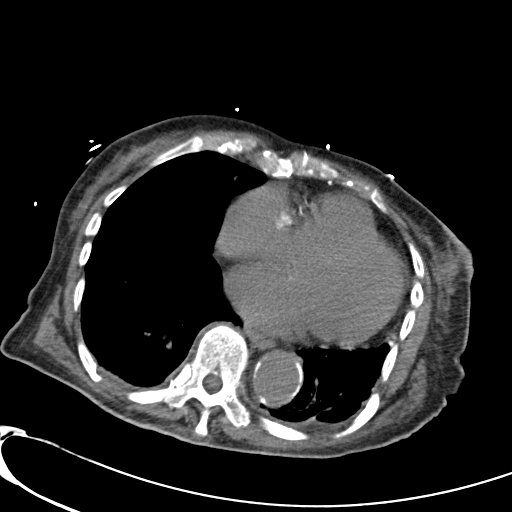
[im 24/60  lung]
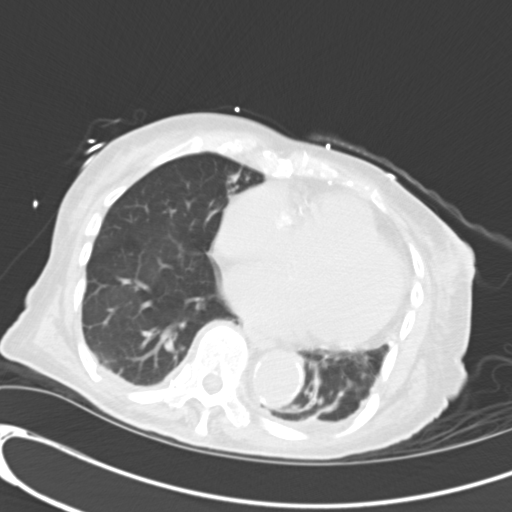
[im 28/60  lung]
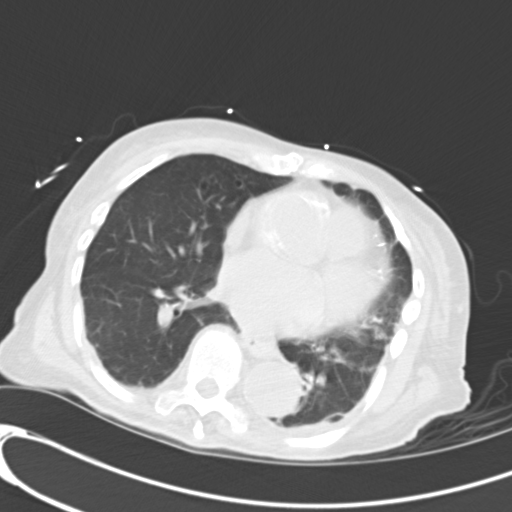
[im 32/60  lung]
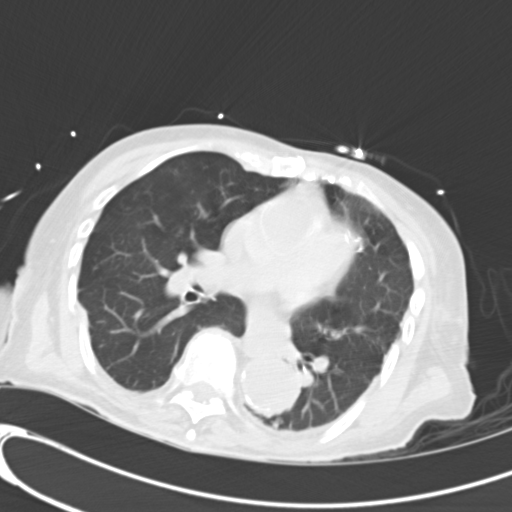
[im 36/60  lung]
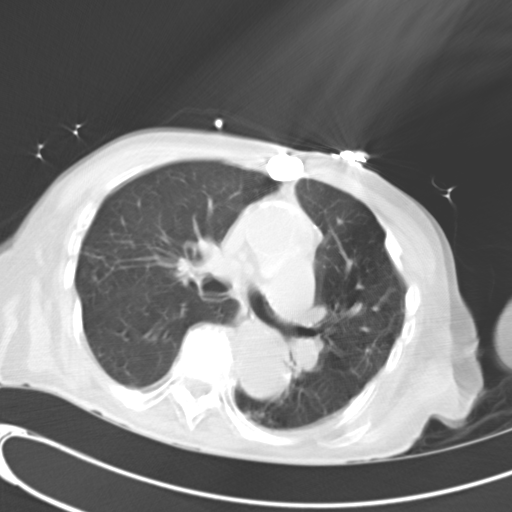
[im 40/60  mediastinal]
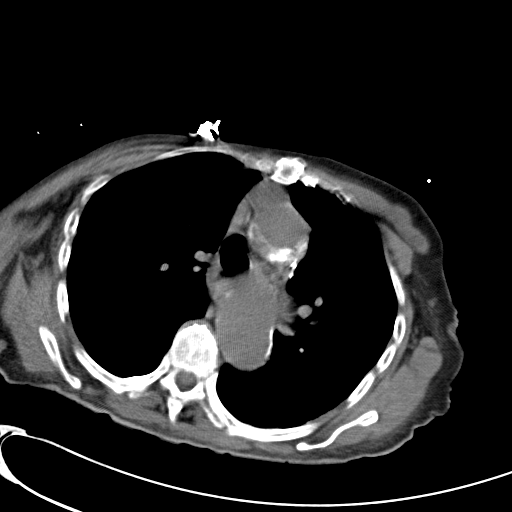
[im 40/60  lung]
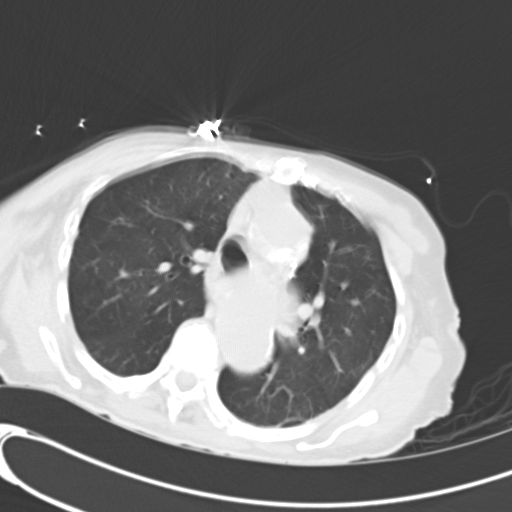
[im 48/60  lung]
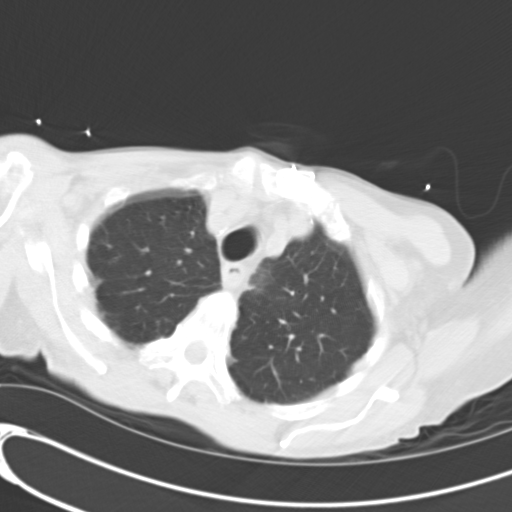
[im 52/60  lung]
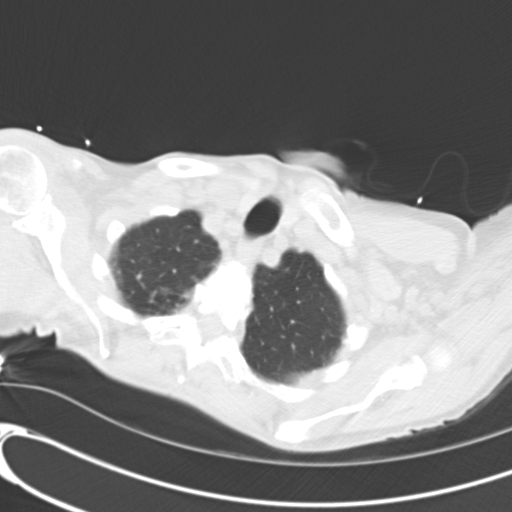
[im 56/60  lung]
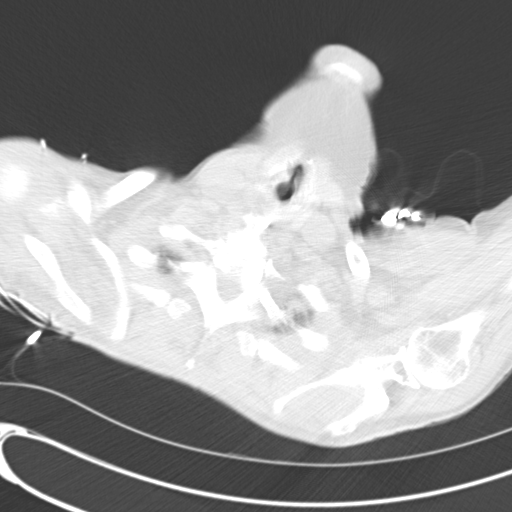

[Series 602: <mpr thick range> · coronal · 0.61mm/px · 3 of 109 slices shown]
[im 22/109  lung]
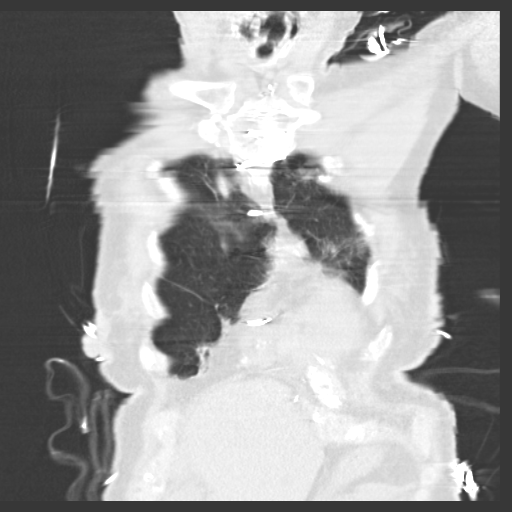
[im 44/109  lung]
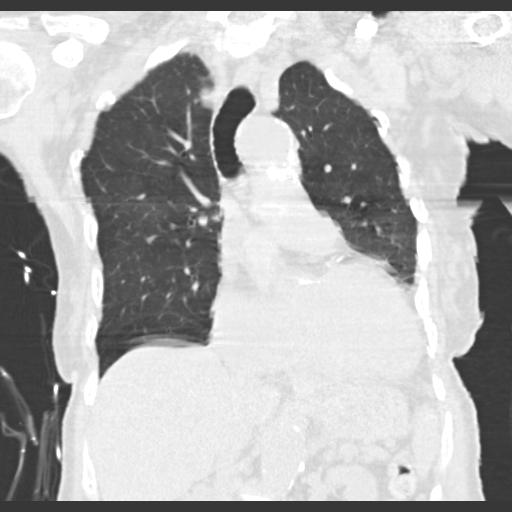
[im 65/109  lung]
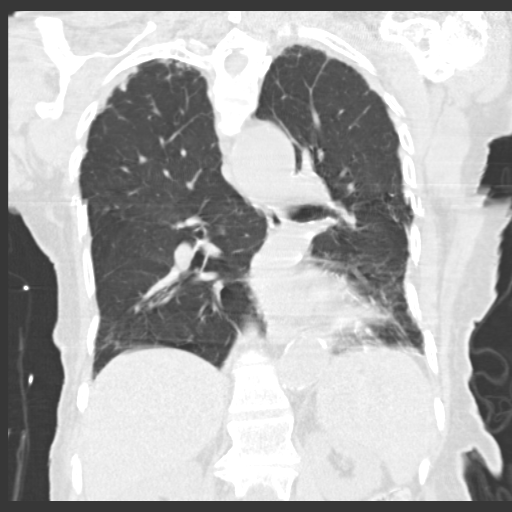

[15 of 36 positions shown; findings below may reference images not displayed]

FINDINGS: Mediastinum/Nodes: There are no enlarged mediastinal, hilar or
axillary lymph nodes.Hilar assessment is limited by motion and the
lack of intravenous contrast, although the hilar contours are
unchanged. The thyroid gland, trachea and esophagus demonstrate no
significant findings. The heart size is stable status post median
sternotomy, CABG and ascending aortic aneurysm repair. Dilatation of
the posterior aortic arch to 4.4 cm has slightly progressed.

Lungs/Pleura: There is mild dependent pleural thickening in both
lungs, but no significant pleural effusion. Dependent atelectasis at
both lung bases has mildly progressed. Otherwise, there is stable
mild chronic lung disease with biapical scarring and scattered mild
subpleural reticulation. There is no confluent airspace opacity,
interstitial lung disease or suspicious pulmonary nodule.

Upper abdomen: The liver demonstrates low density consistent with
steatosis. No acute findings identified within the visualized upper
abdomen.

Musculoskeletal/Chest wall: Osseous evaluation limited by motion.
There is a mild convex right thoracic scoliosis. Asymmetric
glenohumeral degenerative changes are present on the left. Old rib
fractures or thoracotomy defects are noted bilaterally. No definite
acute osseous findings seen.
IMPRESSION: 1. No specific evidence of drug reaction or acute infection in the
lungs.
2. There is mildly increased dependent atelectasis and pleural
thickening in both lungs.
3. Cardiomegaly with diffuse atherosclerosis status post CABG and
aneurysm repair. Dilatation of the aortic arch posteriorly has
mildly progressed.
